# Patient Record
Sex: Female | Born: 1960 | Race: Black or African American | Hispanic: No | Smoking: Former smoker
Health system: Southern US, Community
[De-identification: ages and names within clinical notes are randomized; demographics above are authoritative.]

## PROBLEM LIST (undated history)

## (undated) DIAGNOSIS — E119 Type 2 diabetes mellitus without complications: Secondary | ICD-10-CM

## (undated) DIAGNOSIS — F32A Depression, unspecified: Secondary | ICD-10-CM

## (undated) DIAGNOSIS — R590 Localized enlarged lymph nodes: Secondary | ICD-10-CM

## (undated) DIAGNOSIS — C801 Malignant (primary) neoplasm, unspecified: Secondary | ICD-10-CM

## (undated) DIAGNOSIS — R51 Headache: Secondary | ICD-10-CM

## (undated) DIAGNOSIS — F329 Major depressive disorder, single episode, unspecified: Secondary | ICD-10-CM

## (undated) DIAGNOSIS — Z9119 Patient's noncompliance with other medical treatment and regimen: Secondary | ICD-10-CM

## (undated) DIAGNOSIS — R918 Other nonspecific abnormal finding of lung field: Secondary | ICD-10-CM

## (undated) DIAGNOSIS — I10 Essential (primary) hypertension: Secondary | ICD-10-CM

## (undated) HISTORY — DX: Essential (primary) hypertension: I10

## (undated) HISTORY — PX: TUBAL LIGATION: SHX77

## (undated) HISTORY — DX: Morbid (severe) obesity due to excess calories: E66.01

## (undated) HISTORY — DX: Other nonspecific abnormal finding of lung field: R91.8

## (undated) HISTORY — PX: CHOLECYSTECTOMY: SHX55

## (undated) HISTORY — DX: Depression, unspecified: F32.A

## (undated) HISTORY — DX: Type 2 diabetes mellitus without complications: E11.9

## (undated) HISTORY — DX: Major depressive disorder, single episode, unspecified: F32.9

## (undated) HISTORY — DX: Patient's noncompliance with other medical treatment and regimen: Z91.19

## (undated) HISTORY — DX: Localized enlarged lymph nodes: R59.0

---

## 2001-02-24 ENCOUNTER — Other Ambulatory Visit: Admission: RE | Admit: 2001-02-24 | Discharge: 2001-02-24 | Payer: Self-pay | Admitting: Obstetrics and Gynecology

## 2014-01-11 DIAGNOSIS — R590 Localized enlarged lymph nodes: Secondary | ICD-10-CM

## 2014-01-11 HISTORY — DX: Localized enlarged lymph nodes: R59.0

## 2014-01-30 DIAGNOSIS — R918 Other nonspecific abnormal finding of lung field: Secondary | ICD-10-CM

## 2014-01-30 HISTORY — DX: Other nonspecific abnormal finding of lung field: R91.8

## 2014-02-03 ENCOUNTER — Encounter: Payer: Self-pay | Admitting: *Deleted

## 2014-02-03 ENCOUNTER — Other Ambulatory Visit: Payer: Self-pay | Admitting: *Deleted

## 2014-02-03 ENCOUNTER — Encounter (HOSPITAL_COMMUNITY): Payer: Self-pay | Admitting: Pharmacy Technician

## 2014-02-03 ENCOUNTER — Encounter (HOSPITAL_COMMUNITY): Payer: Self-pay | Admitting: *Deleted

## 2014-02-03 ENCOUNTER — Institutional Professional Consult (permissible substitution) (INDEPENDENT_AMBULATORY_CARE_PROVIDER_SITE_OTHER): Admitting: Thoracic Surgery (Cardiothoracic Vascular Surgery)

## 2014-02-03 ENCOUNTER — Encounter: Payer: Self-pay | Admitting: Thoracic Surgery (Cardiothoracic Vascular Surgery)

## 2014-02-03 VITALS — BP 134/93 | HR 87 | Resp 16 | Ht 64.0 in | Wt 220.0 lb

## 2014-02-03 DIAGNOSIS — R918 Other nonspecific abnormal finding of lung field: Secondary | ICD-10-CM

## 2014-02-03 DIAGNOSIS — E1169 Type 2 diabetes mellitus with other specified complication: Secondary | ICD-10-CM | POA: Insufficient documentation

## 2014-02-03 DIAGNOSIS — E669 Obesity, unspecified: Secondary | ICD-10-CM | POA: Insufficient documentation

## 2014-02-03 DIAGNOSIS — R590 Localized enlarged lymph nodes: Secondary | ICD-10-CM

## 2014-02-03 DIAGNOSIS — F329 Major depressive disorder, single episode, unspecified: Secondary | ICD-10-CM | POA: Insufficient documentation

## 2014-02-03 DIAGNOSIS — E119 Type 2 diabetes mellitus without complications: Secondary | ICD-10-CM

## 2014-02-03 DIAGNOSIS — F32A Depression, unspecified: Secondary | ICD-10-CM | POA: Insufficient documentation

## 2014-02-03 DIAGNOSIS — I10 Essential (primary) hypertension: Secondary | ICD-10-CM | POA: Insufficient documentation

## 2014-02-03 NOTE — Progress Notes (Signed)
PCP is [name], [place] Referring Provider is Darovsky, Marko Stai, MD  Chief Complaint  Patient presents with  . Lung Lesion    referred by DR. DAROVSKY...CT CHEST...CT HEAD    HPI: 53 year old woman who presented with a chief complaint of chest pain.  Ms. Stallone is a 53 year old woman with a remote history of light tobacco use. She was in her usual state of health until last week when she began experiencing severe migraines. Then the last Thursday she noticed chest pain. She felt that this would go way, but by Monday the pain was severe and she went to the emergency department. A CT of the chest was done which showed massive subcarinal adenopathy and multiple bilateral pulmonary nodules.  Workup was negative for pulmonary embolus or MI. She was discharged and is now referred for surgical biopsy.  She states that she's been feeling well prior last week. She works as a Surveyor, minerals. She smoked less than a pack a day for about 7 years when she was younger, but quit in 65 at age 11. She has no known exposure to other carcinogens. She denies fevers or chills, but has been having some night sweats, which she attributed to possible menopause. She has lost 26 pounds over the past 6 months and 14 pounds over the past 3 months. She does state that she has been trying to lose weight and working with her physician on that. Her chest pain has resolved. She denies any cough, hemoptysis, wheezing, stridor, shortness of breath.   Past Medical History  Diagnosis Date  . Hypertension   . Depression   . Pulmonary nodules 01/30/14    CTA CHEST  . Lymphadenopathy, mediastinal 01/2014    CTA ANGIO .[place]  . Morbid obesity   . Diabetes mellitus, type II     Past Surgical History  Procedure Laterality Date  . Cholecystectomy    . Cesarean section      Family History  Problem Relation Age of Onset  . Cancer Mother     UTERINE  . Cancer Father     LEUKEMIA    Social  History History  Substance Use Topics  . Smoking status: Former Smoker -- 0.50 packs/day for 7 years    Types: Cigarettes    Quit date: 02/03/1989  . Smokeless tobacco: Not on file  . Alcohol Use: Not on file    Current Outpatient Prescriptions  Medication Sig Dispense Refill  . hydrochlorothiazide (MICROZIDE) 12.5 MG capsule Take 12.5 mg by mouth daily.      Marland Kitchen lisinopril (PRINIVIL,ZESTRIL) 10 MG tablet Take 10 mg by mouth daily.      . metFORMIN (GLUCOPHAGE) 500 MG tablet Take by mouth daily with breakfast.      . Multiple Vitamin (MULTIVITAMIN) capsule Take 1 capsule by mouth daily.       No current facility-administered medications for this visit.    Allergies  Allergen Reactions  . Asa [Aspirin] Hives    Review of Systems  Constitutional: Positive for fatigue. Negative for fever, chills and appetite change.       Weight loss 26 pounds over 6 months, 14 pounds over past 3 months. Has been trying to lose weight. Night sweats, questioned menopause  Respiratory: Negative for cough, shortness of breath and wheezing.   Cardiovascular: Positive for chest pain.  Neurological: Positive for headaches (Migraines).  All other systems reviewed and are negative.   BP 134/93  Pulse 87  Resp 16  Ht 5'  4" (1.626 m)  Wt 220 lb (99.791 kg)  BMI 37.74 kg/m2  SpO2 98% Physical Exam  Vitals reviewed. Constitutional: She is oriented to person, place, and time. No distress.  Obese  HENT:  Head: Normocephalic and atraumatic.  Eyes: EOM are normal. Pupils are equal, round, and reactive to light.  Neck: Neck supple. No thyromegaly present.  Cardiovascular: Normal rate, regular rhythm, normal heart sounds and intact distal pulses.   No murmur heard. Pulmonary/Chest: Effort normal and breath sounds normal. She has no wheezes. She has no rales.  Abdominal: Soft. There is no tenderness.  Musculoskeletal: She exhibits no edema.  Lymphadenopathy:    She has no cervical adenopathy.   Neurological: She is alert and oriented to person, place, and time. No cranial nerve deficit.  No focal motor deficits  Skin: Skin is warm and dry.     Diagnostic Tests: CT ANGIOGRAPHY CHEST WITH CONTRAST  FINDINGS: Mediastinal lymphadenopathy is identified with confluent subcarinal lymphadenopathy 3.6 cm image 32, and contiguous extension to the right hilum. Small AP window lymph nodes are identified, largest 0.6 cm image 22. Heart size is mildly enlarged. Trace pericardial fluid is present. Trace pleural effusions are noted. There are multiple pulmonary masses throughout both lungs, largest right lower lobe measuring 3.8 cm image 66. Largest representative left upper lobe nodule measures 0.6 cm image 37. Central airways are patent. There is mild mass effect and attenuation of the right mainstem bronchus by the above described subcarinal mass, for example image 40.  Evaluation for pulmonary embolism is suboptimal due to early bolus timing. There is no central filling defect in either main pulmonary artery to suggest acute pulmonary embolism.  No acute osseous abnormality.  Review of the MIP images confirms the above findings.  MPRESSION: Dominant subcarinal lymphadenopathy with contiguous right hilar extension, and multiple bilateral pulmonary nodules/masses. Primary differential considerations include primary pulmonary parenchymal malignancy with intrathoracic and subcarinal metastatic disease, primary mediastinal malignancy such as small cell lung cancer with intrathoracic metastasis, or non visualized primary malignancy elsewhere in the abdomen or pelvis with intrathoracic spread. The subcarinal mass is likely amenable to sampling at bronchoscopy.  Consider nonemergent outpatient CT abdomen/ pelvis with IV contrast for further evaluation depending on scheduled bronchoscopy.  Allowing for suboptimal bolus timing, no central focal filling defect to suggest acute pulmonary embolism is  identified.  These results will be called to the ordering clinician or representative by the Radiologist Assistant, and communication documented in the PACS or zVision Dashboard. Electronically Signed By: Conchita Paris M.D. On: 01/30/2014 16:17  Impression: 53 year old woman with a minimal smoking history who presents with multiple bilateral pulmonary nodules and massive mediastinal adenopathy, primarily in the subcarinal space. Differential diagnosis includes lymphoma, small cell lung cancer, non-small cell lung cancer, and sarcoidosis. Of these I think lymphoma is probably the most likely. In any event, she needs a tissue diagnosis so that appropriate treatment can be initiated.  I recommended to her that we proceed with bronchoscopy and endobronchial ultrasound and possible mediastinoscopy if the first 2 are nondiagnostic. We discussed the general nature of the procedure, including the use of general anesthesia and the incision to be used if necessary. We will plan to do this on an outpatient basis. We discussed the indications, risks, benefits, and alternatives. She understands the risks include, but are not limited to death, MI, bleeding, possible need for surgical repair bleeding, recurrent nerve injury leading to hoarseness, esophageal injury, pneumothorax, as well as the possibility of unforeseeable complications. She  accepts the risks and wishes to proceed as soon as possible.  Plan: Bronchoscopy, EBUS, and possible mediastinoscopy on Monday, July 27

## 2014-02-05 MED ORDER — DEXTROSE 5 % IV SOLN
1.5000 g | INTRAVENOUS | Status: AC
  Administered 2014-02-06: 1.5 g via INTRAVENOUS
  Filled 2014-02-05: qty 1.5

## 2014-02-06 ENCOUNTER — Ambulatory Visit (HOSPITAL_COMMUNITY)
Admission: AD | Admit: 2014-02-06 | Discharge: 2014-02-06 | Disposition: A | Discharge status: Home / Self Care | Source: Ambulatory Visit | Attending: Thoracic Surgery (Cardiothoracic Vascular Surgery) | Admitting: Thoracic Surgery (Cardiothoracic Vascular Surgery)

## 2014-02-06 ENCOUNTER — Encounter (HOSPITAL_COMMUNITY): Admitting: Anesthesiology

## 2014-02-06 ENCOUNTER — Ambulatory Visit (HOSPITAL_COMMUNITY): Admitting: Anesthesiology

## 2014-02-06 ENCOUNTER — Encounter (HOSPITAL_COMMUNITY)
Admission: AD | Disposition: A | Discharge status: Home / Self Care | Payer: Self-pay | Source: Ambulatory Visit | Attending: Thoracic Surgery (Cardiothoracic Vascular Surgery)

## 2014-02-06 ENCOUNTER — Ambulatory Visit (HOSPITAL_COMMUNITY)

## 2014-02-06 ENCOUNTER — Encounter (HOSPITAL_COMMUNITY): Payer: Self-pay | Admitting: Anesthesiology

## 2014-02-06 DIAGNOSIS — I10 Essential (primary) hypertension: Secondary | ICD-10-CM | POA: Diagnosis not present

## 2014-02-06 DIAGNOSIS — E119 Type 2 diabetes mellitus without complications: Secondary | ICD-10-CM | POA: Insufficient documentation

## 2014-02-06 DIAGNOSIS — F329 Major depressive disorder, single episode, unspecified: Secondary | ICD-10-CM | POA: Diagnosis not present

## 2014-02-06 DIAGNOSIS — F3289 Other specified depressive episodes: Secondary | ICD-10-CM | POA: Diagnosis not present

## 2014-02-06 DIAGNOSIS — R590 Localized enlarged lymph nodes: Secondary | ICD-10-CM

## 2014-02-06 DIAGNOSIS — R599 Enlarged lymph nodes, unspecified: Secondary | ICD-10-CM | POA: Insufficient documentation

## 2014-02-06 DIAGNOSIS — Z79899 Other long term (current) drug therapy: Secondary | ICD-10-CM | POA: Insufficient documentation

## 2014-02-06 DIAGNOSIS — R911 Solitary pulmonary nodule: Secondary | ICD-10-CM | POA: Diagnosis not present

## 2014-02-06 DIAGNOSIS — R918 Other nonspecific abnormal finding of lung field: Secondary | ICD-10-CM

## 2014-02-06 DIAGNOSIS — Z886 Allergy status to analgesic agent status: Secondary | ICD-10-CM | POA: Insufficient documentation

## 2014-02-06 DIAGNOSIS — Z87891 Personal history of nicotine dependence: Secondary | ICD-10-CM | POA: Insufficient documentation

## 2014-02-06 HISTORY — DX: Headache: R51

## 2014-02-06 HISTORY — PX: VIDEO BRONCHOSCOPY WITH ENDOBRONCHIAL ULTRASOUND: SHX6177

## 2014-02-06 LAB — CBC
HCT: 38.8 % (ref 36.0–46.0)
HEMOGLOBIN: 12.5 g/dL (ref 12.0–15.0)
MCH: 27.8 pg (ref 26.0–34.0)
MCHC: 32.2 g/dL (ref 30.0–36.0)
MCV: 86.2 fL (ref 78.0–100.0)
PLATELETS: 440 10*3/uL — AB (ref 150–400)
RBC: 4.5 MIL/uL (ref 3.87–5.11)
RDW: 14.9 % (ref 11.5–15.5)
WBC: 12.3 10*3/uL — ABNORMAL HIGH (ref 4.0–10.5)

## 2014-02-06 LAB — COMPREHENSIVE METABOLIC PANEL
ALT: 27 U/L (ref 0–35)
AST: 17 U/L (ref 0–37)
Albumin: 3.3 g/dL — ABNORMAL LOW (ref 3.5–5.2)
Alkaline Phosphatase: 110 U/L (ref 39–117)
Anion gap: 14 (ref 5–15)
BUN: 10 mg/dL (ref 6–23)
CALCIUM: 9.5 mg/dL (ref 8.4–10.5)
CHLORIDE: 103 meq/L (ref 96–112)
CO2: 21 mEq/L (ref 19–32)
CREATININE: 0.62 mg/dL (ref 0.50–1.10)
GLUCOSE: 100 mg/dL — AB (ref 70–99)
Potassium: 4.1 mEq/L (ref 3.7–5.3)
Sodium: 138 mEq/L (ref 137–147)
Total Protein: 8.2 g/dL (ref 6.0–8.3)

## 2014-02-06 LAB — APTT: aPTT: 31 seconds (ref 24–37)

## 2014-02-06 LAB — GLUCOSE, CAPILLARY
GLUCOSE-CAPILLARY: 102 mg/dL — AB (ref 70–99)
GLUCOSE-CAPILLARY: 108 mg/dL — AB (ref 70–99)

## 2014-02-06 LAB — ABO/RH: ABO/RH(D): O POS

## 2014-02-06 LAB — TYPE AND SCREEN
ABO/RH(D): O POS
Antibody Screen: NEGATIVE

## 2014-02-06 LAB — PROTIME-INR
INR: 0.94 (ref 0.00–1.49)
PROTHROMBIN TIME: 12.6 s (ref 11.6–15.2)

## 2014-02-06 SURGERY — BRONCHOSCOPY, WITH EBUS
Anesthesia: General | Site: Bronchus

## 2014-02-06 MED ORDER — ONDANSETRON HCL 4 MG/2ML IJ SOLN
INTRAMUSCULAR | Status: DC | PRN
  Administered 2014-02-06: 4 mg via INTRAVENOUS

## 2014-02-06 MED ORDER — ONDANSETRON HCL 4 MG/2ML IJ SOLN
INTRAMUSCULAR | Status: AC
  Filled 2014-02-06: qty 2

## 2014-02-06 MED ORDER — ROCURONIUM BROMIDE 100 MG/10ML IV SOLN
INTRAVENOUS | Status: DC | PRN
  Administered 2014-02-06: 40 mg via INTRAVENOUS
  Administered 2014-02-06: 10 mg via INTRAVENOUS

## 2014-02-06 MED ORDER — PROPOFOL 10 MG/ML IV BOLUS
INTRAVENOUS | Status: DC | PRN
  Administered 2014-02-06: 200 mg via INTRAVENOUS
  Administered 2014-02-06: 40 mg via INTRAVENOUS

## 2014-02-06 MED ORDER — 0.9 % SODIUM CHLORIDE (POUR BTL) OPTIME
TOPICAL | Status: DC | PRN
  Administered 2014-02-06: 1000 mL

## 2014-02-06 MED ORDER — ONDANSETRON HCL 4 MG/2ML IJ SOLN: 4.0000 mg | Freq: Once | INTRAMUSCULAR | Status: DC | PRN

## 2014-02-06 MED ORDER — ROCURONIUM BROMIDE 50 MG/5ML IV SOLN
INTRAVENOUS | Status: AC
  Filled 2014-02-06: qty 1

## 2014-02-06 MED ORDER — LIDOCAINE HCL (CARDIAC) 20 MG/ML IV SOLN
INTRAVENOUS | Status: DC | PRN
  Administered 2014-02-06: 30 mg via INTRAVENOUS

## 2014-02-06 MED ORDER — PROPOFOL 10 MG/ML IV BOLUS
INTRAVENOUS | Status: AC
Start: 2014-02-06 — End: 2014-02-06
  Filled 2014-02-06: qty 20

## 2014-02-06 MED ORDER — LIDOCAINE HCL (CARDIAC) 20 MG/ML IV SOLN
INTRAVENOUS | Status: AC
  Filled 2014-02-06: qty 5

## 2014-02-06 MED ORDER — MIDAZOLAM HCL 2 MG/2ML IJ SOLN
INTRAMUSCULAR | Status: AC
  Filled 2014-02-06: qty 2

## 2014-02-06 MED ORDER — CHLORHEXIDINE GLUCONATE CLOTH 2 % EX PADS: 6.0000 | MEDICATED_PAD | Freq: Once | CUTANEOUS | Status: DC

## 2014-02-06 MED ORDER — SODIUM CHLORIDE 0.9 % IV SOLN
10.0000 mg | INTRAVENOUS | Status: DC | PRN
  Administered 2014-02-06: 10 ug/min via INTRAVENOUS

## 2014-02-06 MED ORDER — LACTATED RINGERS IV SOLN
INTRAVENOUS | Status: DC
  Administered 2014-02-06 (×2): via INTRAVENOUS

## 2014-02-06 MED ORDER — MIDAZOLAM HCL 5 MG/5ML IJ SOLN
INTRAMUSCULAR | Status: DC | PRN
  Administered 2014-02-06: 2 mg via INTRAVENOUS

## 2014-02-06 MED ORDER — HYDROMORPHONE HCL PF 1 MG/ML IJ SOLN
INTRAMUSCULAR | Status: AC
  Filled 2014-02-06: qty 1

## 2014-02-06 MED ORDER — DEXAMETHASONE SODIUM PHOSPHATE 4 MG/ML IJ SOLN
INTRAMUSCULAR | Status: DC | PRN
  Administered 2014-02-06: 4 mg via INTRAVENOUS

## 2014-02-06 MED ORDER — NEOSTIGMINE METHYLSULFATE 10 MG/10ML IV SOLN
INTRAVENOUS | Status: DC | PRN
  Administered 2014-02-06: 3 mg via INTRAVENOUS

## 2014-02-06 MED ORDER — GLYCOPYRROLATE 0.2 MG/ML IJ SOLN
INTRAMUSCULAR | Status: AC
  Filled 2014-02-06: qty 2

## 2014-02-06 MED ORDER — HYDROMORPHONE HCL PF 1 MG/ML IJ SOLN
0.2500 mg | INTRAMUSCULAR | Status: DC | PRN
Start: 2014-02-06 — End: 2014-02-06
  Administered 2014-02-06: 0.25 mg via INTRAVENOUS

## 2014-02-06 MED ORDER — FENTANYL CITRATE 0.05 MG/ML IJ SOLN
INTRAMUSCULAR | Status: DC | PRN
  Administered 2014-02-06 (×3): 50 ug via INTRAVENOUS
  Administered 2014-02-06: 100 ug via INTRAVENOUS

## 2014-02-06 MED ORDER — GLYCOPYRROLATE 0.2 MG/ML IJ SOLN
INTRAMUSCULAR | Status: DC | PRN
  Administered 2014-02-06: 0.4 mg via INTRAVENOUS

## 2014-02-06 MED ORDER — FENTANYL CITRATE 0.05 MG/ML IJ SOLN
INTRAMUSCULAR | Status: AC
  Filled 2014-02-06: qty 5

## 2014-02-06 MED ORDER — PROPOFOL 10 MG/ML IV BOLUS
INTRAVENOUS | Status: AC
  Filled 2014-02-06: qty 20

## 2014-02-06 SURGICAL SUPPLY — 62 items
ADH SKN CLS APL DERMABOND .7 (GAUZE/BANDAGES/DRESSINGS)
APPLIER CLIP LOGIC TI 5 (MISCELLANEOUS) IMPLANT
APR CLP MED LRG 33X5 (MISCELLANEOUS)
BALL CTTN LRG ABS STRL LF (GAUZE/BANDAGES/DRESSINGS)
BLADE SURG 15 STRL LF DISP TIS (BLADE) IMPLANT
BLADE SURG 15 STRL SS (BLADE)
BRUSH CYTOL CELLEBRITY 1.5X140 (MISCELLANEOUS) ×4 IMPLANT
CANISTER SUCTION 2500CC (MISCELLANEOUS) ×4 IMPLANT
CLIP TI MEDIUM 6 (CLIP) IMPLANT
CONT SPEC 4OZ CLIKSEAL STRL BL (MISCELLANEOUS) ×4 IMPLANT
COTTONBALL LRG STERILE PKG (GAUZE/BANDAGES/DRESSINGS) IMPLANT
COVER SURGICAL LIGHT HANDLE (MISCELLANEOUS) IMPLANT
COVER TABLE BACK 60X90 (DRAPES) ×4 IMPLANT
DERMABOND ADVANCED (GAUZE/BANDAGES/DRESSINGS)
DERMABOND ADVANCED .7 DNX12 (GAUZE/BANDAGES/DRESSINGS) IMPLANT
DRAPE CHEST BREAST 15X10 FENES (DRAPES) IMPLANT
ELECT REM PT RETURN 9FT ADLT (ELECTROSURGICAL)
ELECTRODE REM PT RTRN 9FT ADLT (ELECTROSURGICAL) IMPLANT
FILTER STRAW FLUID ASPIR (MISCELLANEOUS) IMPLANT
FORCEPS BIOP RJ4 1.8 (CUTTING FORCEPS) ×4 IMPLANT
GAUZE SPONGE 4X4 16PLY XRAY LF (GAUZE/BANDAGES/DRESSINGS) IMPLANT
GLOVE SURG SIGNA 7.5 PF LTX (GLOVE) ×4 IMPLANT
GOWN STRL REUS W/ TWL LRG LVL3 (GOWN DISPOSABLE) ×2 IMPLANT
GOWN STRL REUS W/ TWL XL LVL3 (GOWN DISPOSABLE) ×4 IMPLANT
GOWN STRL REUS W/TWL LRG LVL3 (GOWN DISPOSABLE) ×4
GOWN STRL REUS W/TWL XL LVL3 (GOWN DISPOSABLE) ×6
HEMOSTAT SURGICEL 2X14 (HEMOSTASIS) IMPLANT
KIT BASIN OR (CUSTOM PROCEDURE TRAY) IMPLANT
KIT ROOM TURNOVER OR (KITS) ×4 IMPLANT
MARKER SKIN DUAL TIP RULER LAB (MISCELLANEOUS) ×4 IMPLANT
NEEDLE 22X1 1/2 (OR ONLY) (NEEDLE) IMPLANT
NEEDLE BIOPSY TRANSBRONCH 21G (NEEDLE) IMPLANT
NEEDLE BLUNT 18X1 FOR OR ONLY (NEEDLE) IMPLANT
NEEDLE SYS SONOTIP II EBUSTBNA (NEEDLE) ×8 IMPLANT
NS IRRIG 1000ML POUR BTL (IV SOLUTION) ×4 IMPLANT
OIL SILICONE PENTAX (PARTS (SERVICE/REPAIRS)) IMPLANT
PACK SURGICAL SETUP 50X90 (CUSTOM PROCEDURE TRAY) IMPLANT
PAD ARMBOARD 7.5X6 YLW CONV (MISCELLANEOUS) ×8 IMPLANT
PENCIL BUTTON HOLSTER BLD 10FT (ELECTRODE) IMPLANT
SPONGE GAUZE 4X4 12PLY (GAUZE/BANDAGES/DRESSINGS) ×4 IMPLANT
SPONGE INTESTINAL PEANUT (DISPOSABLE) IMPLANT
SUT SILK 2 0 TIES 10X30 (SUTURE) IMPLANT
SUT VIC AB 2-0 CT1 27 (SUTURE)
SUT VIC AB 2-0 CT1 TAPERPNT 27 (SUTURE) IMPLANT
SUT VIC AB 3-0 SH 18 (SUTURE) IMPLANT
SUT VIC AB 3-0 SH 27 (SUTURE)
SUT VIC AB 3-0 SH 27X BRD (SUTURE) IMPLANT
SUT VICRYL 4-0 PS2 18IN ABS (SUTURE) IMPLANT
SWAB COLLECTION DEVICE MRSA (MISCELLANEOUS) IMPLANT
SYR 20CC LL (SYRINGE) ×4 IMPLANT
SYR 20ML ECCENTRIC (SYRINGE) ×4 IMPLANT
SYR 5ML LL (SYRINGE) IMPLANT
SYR 5ML LUER SLIP (SYRINGE) IMPLANT
SYR CONTROL 10ML LL (SYRINGE) IMPLANT
SYRINGE 10CC LL (SYRINGE) IMPLANT
TOWEL OR 17X24 6PK STRL BLUE (TOWEL DISPOSABLE) IMPLANT
TOWEL OR 17X26 10 PK STRL BLUE (TOWEL DISPOSABLE) IMPLANT
TRAP SPECIMEN MUCOUS 40CC (MISCELLANEOUS) ×4 IMPLANT
TUBE ANAEROBIC SPECIMEN COL (MISCELLANEOUS) IMPLANT
TUBE CONNECTING 12'X1/4 (SUCTIONS) ×1
TUBE CONNECTING 12X1/4 (SUCTIONS) ×3 IMPLANT
WATER STERILE IRR 1000ML POUR (IV SOLUTION) IMPLANT

## 2014-02-06 NOTE — Anesthesia Preprocedure Evaluation (Addendum)
Anesthesia Evaluation  Patient identified by MRN, date of birth, ID band Patient awake    Reviewed: Allergy & Precautions, H&P , NPO status , Patient's Chart, lab work & pertinent test results  Airway Mallampati: I      Dental  (+) Teeth Intact   Pulmonary former smoker,          Cardiovascular hypertension, Pt. on medications     Neuro/Psych  Headaches, Depression    GI/Hepatic   Endo/Other  diabetes, Type 2, Oral Hypoglycemic Agents  Renal/GU      Musculoskeletal   Abdominal   Peds  Hematology   Anesthesia Other Findings   Reproductive/Obstetrics                          Anesthesia Physical Anesthesia Plan  ASA: II  Anesthesia Plan: General   Post-op Pain Management:    Induction: Intravenous  Airway Management Planned: Oral ETT  Additional Equipment:   Intra-op Plan:   Post-operative Plan: Extubation in OR  Informed Consent: I have reviewed the patients History and Physical, chart, labs and discussed the procedure including the risks, benefits and alternatives for the proposed anesthesia with the patient or authorized representative who has indicated his/her understanding and acceptance.     Plan Discussed with: CRNA, Anesthesiologist and Surgeon  Anesthesia Plan Comments:         Anesthesia Quick Evaluation

## 2014-02-06 NOTE — Anesthesia Postprocedure Evaluation (Signed)
  Anesthesia Post-op Note  Patient: Erica Barker  Procedure(s) Performed: Procedure(s): VIDEO BRONCHOSCOPY WITH ENDOBRONCHIAL ULTRASOUND,bronchial biopsies , node sampling (N/A)  Patient Location: PACU  Anesthesia Type:General  Level of Consciousness: awake, alert , oriented and patient cooperative  Airway and Oxygen Therapy: Patient Spontanous Breathing  Post-op Pain: none  Post-op Assessment: Post-op Vital signs reviewed, Patient's Cardiovascular Status Stable, Respiratory Function Stable, Patent Airway, No signs of Nausea or vomiting and Pain level controlled  Post-op Vital Signs: stable  Last Vitals:  Filed Vitals:   02/06/14 1204  BP: 150/82  Pulse: 73  Temp:   Resp: 23    Complications: No apparent anesthesia complications

## 2014-02-06 NOTE — Transfer of Care (Signed)
Immediate Anesthesia Transfer of Care Note  Patient: Erica Barker  Procedure(s) Performed: Procedure(s): VIDEO BRONCHOSCOPY WITH ENDOBRONCHIAL ULTRASOUND,bronchial biopsies , node sampling (N/A)  Patient Location: PACU  Anesthesia Type:General  Level of Consciousness: sedated  Airway & Oxygen Therapy: Patient Spontanous Breathing and Patient connected to face mask oxygen  Post-op Assessment: Report given to PACU RN, Post -op Vital signs reviewed and stable and Patient moving all extremities X 4  Post vital signs: Reviewed and stable  Complications: No apparent anesthesia complications

## 2014-02-06 NOTE — Progress Notes (Signed)
Called Dr.Smith r/t pt has swelling in upper lip. States most likely from tube. No new orders.

## 2014-02-06 NOTE — Anesthesia Procedure Notes (Signed)
Procedure Name: Intubation Date/Time: 02/06/2014 10:08 AM Performed by: Rush Farmer E Pre-anesthesia Checklist: Patient identified, Emergency Drugs available, Suction available, Patient being monitored and Timeout performed Patient Re-evaluated:Patient Re-evaluated prior to inductionOxygen Delivery Method: Circle system utilized Preoxygenation: Pre-oxygenation with 100% oxygen Intubation Type: IV induction Ventilation: Mask ventilation without difficulty Laryngoscope Size: Mac and 3 Grade View: Grade III Tube type: Oral Tube size: 8.5 mm Number of attempts: 1 Airway Equipment and Method: Bougie stylet and LTA kit utilized Placement Confirmation: positive ETCO2 and breath sounds checked- equal and bilateral Secured at: 19 cm Tube secured with: Tape Dental Injury: Teeth and Oropharynx as per pre-operative assessment  Comments: AOI per [place], SRNA with Dr. Tamala Julian supervising. Grade III view with MAC3. Blue bougie used. +ETCO2 and BBS=.

## 2014-02-06 NOTE — Interval H&P Note (Signed)
History and Physical Interval Note:  02/06/2014 9:49 AM  Erica Barker  has presented today for surgery, with the diagnosis of LUNG NODULES MEDIASTINAL ADENOPATHY  The various methods of treatment have been discussed with the patient and family. After consideration of risks, benefits and other options for treatment, the patient has consented to  Procedure(s): VIDEO BRONCHOSCOPY WITH ENDOBRONCHIAL ULTRASOUND (N/A) MEDIASTINOSCOPY (N/A) as a surgical intervention .  The patient's history has been reviewed, patient examined, no change in status, stable for surgery.  I have reviewed the patient's chart and labs.  Questions were answered to the patient's satisfaction.     Sway Guttierrez C

## 2014-02-06 NOTE — Brief Op Note (Signed)
02/06/2014  11:50 AM  PATIENT:  Velora Mediate  53 y.o. female  PRE-OPERATIVE DIAGNOSIS:   LUNG NODULES MEDIASTINAL ADENOPATHY  POST-OPERATIVE DIAGNOSIS:   LUNG NODULES MEDIASTINAL ADENOPATHY  PROCEDURE:  Procedure(s): VIDEO BRONCHOSCOPY WITH ENDOBRONCHIAL ULTRASOUND,bronchial biopsies , node sampling (N/A)  SURGEON:  Surgeon(s) and Role:    * Melrose Nakayama, MD - Primary   ANESTHESIA:   general  EBL:  Total I/O In: 1000 [I.V.:1000] Out: -   BLOOD ADMINISTERED:none  DRAINS: none   LOCAL MEDICATIONS USED:  NONE  SPECIMEN:  Source of Specimen:  level 7 lymph nodes, carinal biopsies, RUL brushings  DISPOSITION OF SPECIMEN:  PATHOLOGY  PLAN OF CARE: Discharge to home after PACU  PATIENT DISPOSITION:  PACU - hemodynamically stable.   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

## 2014-02-06 NOTE — H&P (View-Only) (Signed)
PCP is [name], [place] Referring Provider is Darovsky, Marko Stai, MD  Chief Complaint  Patient presents with  . Lung Lesion    referred by DR. DAROVSKY...CT CHEST...CT HEAD    HPI: 53 year old woman who presented with a chief complaint of chest pain.  Ms. Kitchen is a 53 year old woman with a remote history of light tobacco use. She was in her usual state of health until last week when she began experiencing severe migraines. Then the last Thursday she noticed chest pain. She felt that this would go way, but by Monday the pain was severe and she went to the emergency department. A CT of the chest was done which showed massive subcarinal adenopathy and multiple bilateral pulmonary nodules.  Workup was negative for pulmonary embolus or MI. She was discharged and is now referred for surgical biopsy.  She states that she's been feeling well prior last week. 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 She has no known exposure to other carcinogens. She denies fevers or chills, but has been having some night sweats, which she attributed to possible menopause. She has lost 26 pounds over the past 6 months and 14 pounds over the past 3 months. She does state that she has been trying to lose weight and working with her physician on that. Her chest pain has resolved. She denies any cough, hemoptysis, wheezing, stridor, shortness of breath.   Past Medical History  Diagnosis Date  . Hypertension   . Depression   . Pulmonary nodules 01/30/14    CTA CHEST  . Lymphadenopathy, mediastinal 01/2014    [place]IO .[place]  . Morbid obesity   . Diabetes mellitus, type II     Past Surgical History  Procedure Laterality Date  . Cholecystectomy    . Cesarean section      Family History  Problem Relation Age of Onset  . Cancer Mother     UTERINE  . Cancer Father     LEUKEMIA    Social  History History  Substance Use Topics  . Smoking status: Former Smoker -- 0.50 packs/day for 7 years    Types: Cigarettes    Quit date: 02/03/1989  . Smokeless tobacco: Not on file  . Alcohol Use: Not on file    Current Outpatient Prescriptions  Medication Sig Dispense Refill  . hydrochlorothiazide (MICROZIDE) 12.5 MG capsule Take 12.5 mg by mouth daily.      Marland Kitchen lisinopril (PRINIVIL,ZESTRIL) 10 MG tablet Take 10 mg by mouth daily.      . metFORMIN (GLUCOPHAGE) 500 MG tablet Take by mouth daily with breakfast.      . Multiple Vitamin (MULTIVITAMIN) capsule Take 1 capsule by mouth daily.       No current facility-administered medications for this visit.    Allergies  Allergen Reactions  . Asa [Aspirin] Hives    Review of Systems  Constitutional: Positive for fatigue. Negative for fever, chills and appetite change.       Weight loss 26 pounds over 6 months, 14 pounds over past 3 months. Has been trying to lose weight. Night sweats, questioned menopause  Respiratory: Negative for cough, shortness of breath and wheezing.   Cardiovascular: Positive for chest pain.  Neurological: Positive for headaches (Migraines).  All other systems reviewed and are negative.   BP 134/93  Pulse 87  Resp 16  Ht 5'  4" (1.626 m)  Wt 220 lb (99.791 kg)  BMI 37.74 kg/m2  SpO2 98% Physical Exam  Vitals reviewed. Constitutional: She is oriented to person, place, and time. No distress.  Obese  HENT:  Head: Normocephalic and atraumatic.  Eyes: EOM are normal. Pupils are equal, round, and reactive to light.  Neck: Neck supple. No thyromegaly present.  Cardiovascular: Normal rate, regular rhythm, normal heart sounds and intact distal pulses.   No murmur heard. Pulmonary/Chest: Effort normal and breath sounds normal. She has no wheezes. She has no rales.  Abdominal: Soft. There is no tenderness.  Musculoskeletal: She exhibits no edema.  Lymphadenopathy:    She has no cervical adenopathy.   Neurological: She is alert and oriented to person, place, and time. No cranial nerve deficit.  No focal motor deficits  Skin: Skin is warm and dry.     Diagnostic Tests: CT ANGIOGRAPHY CHEST WITH CONTRAST  FINDINGS: Mediastinal lymphadenopathy is identified with confluent subcarinal lymphadenopathy 3.6 cm image 32, and contiguous extension to the right hilum. Small AP window lymph nodes are identified, largest 0.6 cm image 22. Heart size is mildly enlarged. Trace pericardial fluid is present. Trace pleural effusions are noted. There are multiple pulmonary masses throughout both lungs, largest right lower lobe measuring 3.8 cm image 66. Largest representative left upper lobe nodule measures 0.6 cm image 37. Central airways are patent. There is mild mass effect and attenuation of the right mainstem bronchus by the above described subcarinal mass, for example image 40.  Evaluation for pulmonary embolism is suboptimal due to early bolus timing. There is no central filling defect in either main pulmonary artery to suggest acute pulmonary embolism.  No acute osseous abnormality.  Review of the MIP images confirms the above findings.  MPRESSION: Dominant subcarinal lymphadenopathy with contiguous right hilar extension, and multiple bilateral pulmonary nodules/masses. Primary differential considerations include primary pulmonary parenchymal malignancy with intrathoracic and subcarinal metastatic disease, primary mediastinal malignancy such as small cell lung cancer with intrathoracic metastasis, or non visualized primary malignancy elsewhere in the abdomen or pelvis with intrathoracic spread. The subcarinal mass is likely amenable to sampling at bronchoscopy.  Consider nonemergent outpatient CT abdomen/ pelvis with IV contrast for further evaluation depending on scheduled bronchoscopy.  Allowing for suboptimal bolus timing, no central focal filling defect to suggest acute pulmonary embolism is  identified.  These results will be called to the ordering clinician or representative by the Radiologist Assistant, and communication documented in the PACS or zVision Dashboard. Electronically Signed By: Conchita Paris M.D. On: 01/30/2014 16:17  Impression: 53 year old woman with a minimal smoking history who presents with multiple bilateral pulmonary nodules and massive mediastinal adenopathy, primarily in the subcarinal space. Differential diagnosis includes lymphoma, small cell lung cancer, non-small cell lung cancer, and sarcoidosis. Of these I think lymphoma is probably the most likely. In any event, she needs a tissue diagnosis so that appropriate treatment can be initiated.  I recommended to her that we proceed with bronchoscopy and endobronchial ultrasound and possible mediastinoscopy if the first 2 are nondiagnostic. We discussed the general nature of the procedure, including the use of general anesthesia and the incision to be used if necessary. We will plan to do this on an outpatient basis. We discussed the indications, risks, benefits, and alternatives. She understands the risks include, but are not limited to death, MI, bleeding, possible need for surgical repair bleeding, recurrent nerve injury leading to hoarseness, esophageal injury, pneumothorax, as well as the possibility of unforeseeable complications. She  accepts the risks and wishes to proceed as soon as possible.  Plan: Bronchoscopy, EBUS, and possible mediastinoscopy on Monday, July 27

## 2014-02-06 NOTE — Progress Notes (Signed)
Noted patient has swollen lip, Dr. Tamala Julian at bedside.  Ordered to place ice on lip.

## 2014-02-06 NOTE — Discharge Instructions (Addendum)
Do not drive or engage in heavy physical activity for 24 hours  You may cough up small amounts of blood over the next few days  Call [phone] for a follow up appointment next week  What to eat:  For your first meals, you should eat lightly; only small meals initially.  If you do not have nausea, you may eat larger meals.  Avoid spicy, greasy and heavy food.    General Anesthesia, Adult, Care After  Refer to this sheet in the next few weeks. These instructions provide you with information on caring for yourself after your procedure. Your health care provider may also give you more specific instructions. Your treatment has been planned according to current medical practices, but problems sometimes occur. Call your health care provider if you have any problems or questions after your procedure.  WHAT TO EXPECT AFTER THE PROCEDURE  After the procedure, it is typical to experience:  Sleepiness.  Nausea and vomiting. HOME CARE INSTRUCTIONS  For the first 24 hours after general anesthesia:  Have a responsible person with you.  Do not drive a car. If you are alone, do not take public transportation.  Do not drink alcohol.  Do not take medicine that has not been prescribed by your health care provider.  Do not sign important papers or make important decisions.  You may resume a normal diet and activities as directed by your health care provider.  Change bandages (dressings) as directed.  If you have questions or problems that seem related to general anesthesia, call the hospital and ask for the anesthetist or anesthesiologist on call. SEEK MEDICAL CARE IF:  You have nausea and vomiting that continue the day after anesthesia.  You develop a rash. SEEK IMMEDIATE MEDICAL CARE IF:  You have difficulty breathing.  You have chest pain.  You have any allergic problems. Document Released: 10/06/2000 Document Revised: 03/02/2013 Document Reviewed: 01/13/2013  [facility] Patient Information 2014  [place], [place].

## 2014-02-07 ENCOUNTER — Encounter (HOSPITAL_COMMUNITY): Payer: Self-pay | Admitting: Thoracic Surgery (Cardiothoracic Vascular Surgery)

## 2014-02-07 NOTE — Op Note (Signed)
NAMEMarland Kitchen  Erica Barker, Erica Barker            ACCOUNT NO.:  [id number]  MEDICAL RECORD NO.:  [id number]  LOCATION:  [facility]                         FACILITY:  [facility]  PHYSICIAN:  Revonda Standard. Roxan Hockey, M.D.DATE OF BIRTH:  1961-04-20  DATE OF PROCEDURE:  02/06/2014 DATE OF DISCHARGE:  02/06/2014                              OPERATIVE REPORT   PREOPERATIVE DIAGNOSIS:  Multiple lung nodules with mediastinal adenopathy.  POSTOPERATIVE DIAGNOSIS:  Multiple lung nodules with mediastinal adenopathy, likely carcinoma.  PROCEDURE:  Video bronchoscopy with brushings and biopsies, endobronchial ultrasound with mediastinal lymph node sampling.  SURGEON:  Revonda Standard. Roxan Hockey, M.D.  ASSISTANT:  None.  ANESTHESIA:  General.  FINDINGS:  Massively enlarged subcarinal node.  Initial aspirations revealed malignant cells, likely carcinoma. Questionable endobronchial lesion in the anterior segmental bronchus of the right upper lobe. Thickening and nodularity of the mucosa at the carina.  CLINICAL NOTE:  Erica Barker is a 53 year old woman who had presented with chest pain.  A CT of the chest was done to rule out pulmonary embolus.  There was no pulmonary embolus, but she was found to have multiple large lung nodules and massive subcarinal adenopathy.  She was advised to undergo bronchoscopy and endobronchial ultrasound with possible mediastinoscopy in order to establish a diagnosis.  The indications, risks, benefits, and alternatives were discussed in detail with the patient.  She understood and accepted the risks and agreed to proceed.  OPERATIVE NOTE:  Erica Barker was brought to the operating room on February 06, 2014.  She had induction of general anesthesia and was intubated. Flexible fiberoptic bronchoscopy was performed via the endotracheal tube.  There was normal endobronchial anatomy.  The carina was blunted and there was thickening of the mucosa and some nodularity to the mucosa at the carina.   There was a questionable endobronchial lesion in the anterior segmental bronchus of the right upper lobe.  This more likely was redundant mucosa, but brushings were performed from this area.  The endobronchial ultrasound scope then was placed.  The subcarinal nodal mass was easily localized.  Multiple aspirations were performed. The initial samples were sent for Quick-Prep.  The remaining samples were sent for permanent pathology.  Quick-Prep revealed malignant cells, likely carcinoma, but no definitive diagnosis could be made.  Additional aspirations were taken again and sent for permanent pathology only.  The ultrasound scope was withdrawn and the bronchoscope was replaced.  The mucosa at the carina had a thickened nodular appearance to it, this may have just been reactive to the underlying adenopathy. Biopsies were taken of the carina.  After taking biopsy of the mucosa, it appeared I was possibly into the subcarinal nodal mass.  Additional biopsies were taken from that area as well and sent as a separate specimen.  Dilute epinephrine was applied to assist with hemostasis.  After ensuring there was no ongoing bleeding, the scope was withdrawn.  The patient was extubated in the operating room and taken to the postanesthetic care unit in good condition.     Revonda Standard Roxan Hockey, M.D.     SCH/MEDQ  D:  02/06/2014  T:  02/07/2014  Job:  [id number]

## 2014-02-09 ENCOUNTER — Encounter: Payer: Self-pay | Admitting: Thoracic Surgery (Cardiothoracic Vascular Surgery)

## 2014-02-09 ENCOUNTER — Other Ambulatory Visit: Payer: Self-pay | Admitting: *Deleted

## 2014-02-09 ENCOUNTER — Ambulatory Visit (INDEPENDENT_AMBULATORY_CARE_PROVIDER_SITE_OTHER): Payer: Self-pay | Admitting: Thoracic Surgery (Cardiothoracic Vascular Surgery)

## 2014-02-09 VITALS — BP 115/80 | HR 97 | Ht 64.0 in | Wt 220.0 lb

## 2014-02-09 DIAGNOSIS — R918 Other nonspecific abnormal finding of lung field: Secondary | ICD-10-CM

## 2014-02-09 DIAGNOSIS — C801 Malignant (primary) neoplasm, unspecified: Secondary | ICD-10-CM

## 2014-02-09 DIAGNOSIS — C3490 Malignant neoplasm of unspecified part of unspecified bronchus or lung: Secondary | ICD-10-CM

## 2014-02-09 NOTE — Progress Notes (Signed)
Patient ID: Erica Barker, female   DOB: August 02, 1960, 53 y.o.   MRN: [id number]   Erica Barker returns today after having an EBUS on Monday. She has bilateral lung masses and a massive subcarinal lymph node. 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  I once again discussed the results of the biopsy with her. She will need chemotherapy and possibly radiation to the lymph node as well.   We reviewed the path in [facility] conference this morning. There was insufficient tissue for immuno stains and genetic testing. I discussed the importance of obtaining more tissue for the additional testing as it might provide valuable information that will improve her treatment. She is willing to undergo an additional biopsy.  IR felt that they could get adequate tissue with a CT guided biopsy of the RLL mass. We will arrange for that. She has an appointment with Dr. Jacquiline Doe next week.

## 2014-02-12 ENCOUNTER — Other Ambulatory Visit: Payer: Self-pay | Admitting: Radiology

## 2014-02-15 ENCOUNTER — Ambulatory Visit (HOSPITAL_COMMUNITY)
Admission: RE | Admit: 2014-02-15 | Discharge: 2014-02-15 | Disposition: A | Discharge status: Home / Self Care | Source: Ambulatory Visit | Attending: Thoracic Surgery (Cardiothoracic Vascular Surgery) | Admitting: Thoracic Surgery (Cardiothoracic Vascular Surgery)

## 2014-02-15 ENCOUNTER — Encounter (HOSPITAL_COMMUNITY): Payer: Self-pay

## 2014-02-15 VITALS — BP 123/98 | HR 77 | Temp 97.6°F | Resp 18 | Ht 64.0 in | Wt 225.0 lb

## 2014-02-15 DIAGNOSIS — R222 Localized swelling, mass and lump, trunk: Secondary | ICD-10-CM | POA: Insufficient documentation

## 2014-02-15 DIAGNOSIS — R918 Other nonspecific abnormal finding of lung field: Secondary | ICD-10-CM

## 2014-02-15 DIAGNOSIS — J95811 Postprocedural pneumothorax: Secondary | ICD-10-CM | POA: Insufficient documentation

## 2014-02-15 LAB — GLUCOSE, CAPILLARY
GLUCOSE-CAPILLARY: 76 mg/dL (ref 70–99)
Glucose-Capillary: 97 mg/dL (ref 70–99)

## 2014-02-15 LAB — CBC
HCT: 35.7 % — ABNORMAL LOW (ref 36.0–46.0)
HEMOGLOBIN: 11.3 g/dL — AB (ref 12.0–15.0)
MCH: 28 pg (ref 26.0–34.0)
MCHC: 31.7 g/dL (ref 30.0–36.0)
MCV: 88.4 fL (ref 78.0–100.0)
Platelets: 355 10*3/uL (ref 150–400)
RBC: 4.04 MIL/uL (ref 3.87–5.11)
RDW: 14.7 % (ref 11.5–15.5)
WBC: 10.8 10*3/uL — AB (ref 4.0–10.5)

## 2014-02-15 LAB — PROTIME-INR
INR: 1.02 (ref 0.00–1.49)
PROTHROMBIN TIME: 13.4 s (ref 11.6–15.2)

## 2014-02-15 LAB — APTT: APTT: 32 s (ref 24–37)

## 2014-02-15 MED ORDER — MIDAZOLAM HCL 2 MG/2ML IJ SOLN
INTRAMUSCULAR | Status: AC
  Filled 2014-02-15: qty 4

## 2014-02-15 MED ORDER — LIDOCAINE HCL 1 % IJ SOLN
INTRAMUSCULAR | Status: AC
  Filled 2014-02-15: qty 10

## 2014-02-15 MED ORDER — SODIUM CHLORIDE 0.9 % IV SOLN
INTRAVENOUS | Status: DC
  Administered 2014-02-15: 10:00:00 via INTRAVENOUS

## 2014-02-15 MED ORDER — MIDAZOLAM HCL 2 MG/2ML IJ SOLN
INTRAMUSCULAR | Status: AC | PRN
  Administered 2014-02-15: 2 mg via INTRAVENOUS

## 2014-02-15 MED ORDER — FENTANYL CITRATE 0.05 MG/ML IJ SOLN
INTRAMUSCULAR | Status: AC | PRN
  Administered 2014-02-15: 50 ug via INTRAVENOUS

## 2014-02-15 MED ORDER — FENTANYL CITRATE 0.05 MG/ML IJ SOLN
INTRAMUSCULAR | Status: DC
Start: 2014-02-15 — End: 2014-02-16
  Filled 2014-02-15: qty 2

## 2014-02-15 NOTE — Sedation Documentation (Signed)
Patient denies pain and is resting comfortably.  

## 2014-02-15 NOTE — Procedures (Signed)
Successful RLL MASS CORE BX NO COMP STABLE PATH PENDING FULL REPORT IN PACS

## 2014-02-15 NOTE — Discharge Instructions (Signed)
Needle Biopsy of Lung, Care After °Refer to this sheet in the next few weeks. These instructions provide you with information on caring for yourself after your procedure. Your health care provider may also give you more specific instructions. Your treatment has been planned according to current medical practices, but problems sometimes occur. Call your health care provider if you have any problems or questions after your procedure. °WHAT TO EXPECT AFTER THE PROCEDURE °· A bandage will be applied over the area where the needle was inserted. You may be asked to apply pressure to the bandage for several minutes to ensure there is minimal bleeding. °· In most cases, you can leave when your needle biopsy procedure is completed. Do not drive yourself home. Someone else should take you home. °· If you received an IV sedative or general anesthetic, you will be taken to a comfortable place to relax while the medicine wears off. °· If you have upcoming travel scheduled, talk to your health care provider about when it is safe to travel by air after the procedure. °HOME CARE INSTRUCTIONS °· Expect to take it easy for the rest of the day. °· Protect the area where you received the needle biopsy by keeping the bandage in place for as long as instructed. °· You may feel some mild pain or discomfort in the area, but this should stop in a day or two. °· Take medicines only as directed by your health care provider. °SEEK MEDICAL CARE IF:  °· You have pain at the biopsy site that worsens or is not helped by medicine. °· You have swelling or drainage at the needle biopsy site. °· You have a fever. °SEEK IMMEDIATE MEDICAL CARE IF:  °· You have new or worsening shortness of breath. °· You have chest pain. °· You are coughing up blood. °· You have bleeding that does not stop with pressure or a bandage. °· You develop light-headedness or fainting. °Document Released: 04/27/2007 Document Revised: 11/14/2013 Document Reviewed:  11/22/2012 °Exit[employer]® Patient Information ©2015 [employer]. This information is not intended to replace advice given to you by your health care provider. Make sure you discuss any questions you have with your health care provider. ° °

## 2014-02-15 NOTE — H&P (Signed)
Chief Complaint: "I am here for a lung biopsy."  Referring Physician: Dr. Roxan Hockey HPI: Erica Barker is an 53 y.o. female with bilateral lung masses s/p EBUS that was positive for NSCLC. Additional tissue needed for immuno stains and genetic testing. Scheduled today for CT guided biopsy of RLL lung mass with moderate sedation. The patient denies any chest pain, shortness of breath or palpitations. She denies any active signs of bleeding or excessive bruising. She denies any recent fever or chills. The patient denies any history of sleep apnea or chronic oxygen use. She has previously tolerated sedation without complications.   Past Medical History:  Past Medical History  Diagnosis Date  . Hypertension   . Depression   . Pulmonary nodules 01/30/14    CTA CHEST  . Lymphadenopathy, mediastinal 01/2014    [place]  . Morbid obesity   . Diabetes mellitus, type II   . NIOEVOJJ(009.3)     Past Surgical History:  Past Surgical History  Procedure Laterality Date  . Cholecystectomy    . Cesarean section    . Video bronchoscopy with endobronchial ultrasound N/A 02/06/2014    Procedure: VIDEO BRONCHOSCOPY WITH ENDOBRONCHIAL ULTRASOUND,bronchial biopsies , node sampling;  Surgeon: Melrose Nakayama, MD;  Location: [facility] OR;  Service: Thoracic;  Laterality: N/A;    Family History:  Family History  Problem Relation Age of Onset  . Cancer Mother     UTERINE  . Cancer Father     LEUKEMIA    Social History:  reports that she quit smoking about 25 years ago. Her smoking use included Cigarettes. She has a 3.5 pack-year smoking history. She does not have any smokeless tobacco history on file. She reports that she drinks alcohol. She reports that she does not use illicit drugs.  Allergies:  Allergies  Allergen Reactions  . Asa [Aspirin] Hives    Medications:   Medication List    ASK your doctor about these medications       aspirin-acetaminophen-caffeine  250-250-65 MG per tablet  Commonly known as:  EXCEDRIN MIGRAINE  Take 2 tablets by mouth every 6 (six) hours as needed for headache.     lisinopril 10 MG tablet  Commonly known as:  PRINIVIL,ZESTRIL  Take 10 mg by mouth daily.     metFORMIN 500 MG tablet  Commonly known as:  GLUCOPHAGE  Take 500 mg by mouth daily with breakfast.     multivitamin capsule  Take 1 capsule by mouth daily.     oxyCODONE-acetaminophen 5-325 MG per tablet  Commonly known as:  PERCOCET/ROXICET  Take 1 tablet by mouth every 4 (four) hours as needed for severe pain.       Please HPI for pertinent positives, otherwise complete 10 system ROS negative.  Physical Exam: BP 117/86  Pulse 66  Temp(Src) 97.7 F (36.5 C) (Oral)  Resp 20  Ht 5\' 4"  (1.626 m)  Wt 225 lb (102.059 kg)  BMI 38.60 kg/m2  SpO2 100%  LMP 10/12/2013 Body mass index is 38.6 kg/(m^2).  General Appearance:  Alert, cooperative, no distress  Head:  Normocephalic, without obvious abnormality, atraumatic  Neck: Supple, symmetrical, trachea midline  Lungs:   Clear to auscultation bilaterally, no w/r/r, respirations unlabored without use of accessory muscles.  Chest Wall:  No tenderness or deformity  Heart:  Regular rate and rhythm, S1, S2 normal, no murmur, rub or gallop.  Abdomen:   Soft, non-tender, non distended, (+) BS  Extremities: Extremities normal, atraumatic, no cyanosis or  edema  Neurologic: Normal affect, no gross deficits.   Results for orders placed during the hospital encounter of 02/15/14 (from the past 48 hour(s))  APTT     Status: None   Collection Time    02/15/14  9:47 AM      Result Value Ref Range   aPTT 32  24 - 37 seconds  CBC     Status: Abnormal   Collection Time    02/15/14  9:47 AM      Result Value Ref Range   WBC 10.8 (*) 4.0 - 10.5 K/uL   RBC 4.04  3.87 - 5.11 MIL/uL   Hemoglobin 11.3 (*) 12.0 - 15.0 g/dL   HCT 35.7 (*) 36.0 - 46.0 %   MCV 88.4  78.0 - 100.0 fL   MCH 28.0  26.0 - 34.0 pg   MCHC  31.7  30.0 - 36.0 g/dL   RDW 14.7  11.5 - 15.5 %   Platelets 355  150 - 400 K/uL  PROTIME-INR     Status: None   Collection Time    02/15/14  9:47 AM      Result Value Ref Range   Prothrombin Time 13.4  11.6 - 15.2 seconds   INR 1.02  0.00 - 1.49  GLUCOSE, CAPILLARY     Status: None   Collection Time    02/15/14 10:15 AM      Result Value Ref Range   Glucose-Capillary 97  70 - 99 mg/dL   No results found.  Assessment/Plan Right lower lobe lung mass S/p EBUS consistent with NSCLC Need additional tissue for immuno stains and genetic testing. Scheduled today for CT guided biopsy of RLL lung mass with moderate sedation. Patient has been NPO, no blood thinners taken, labs and images reviewed.  Risks and Benefits discussed with the patient. All of the patient's questions were answered, patient is agreeable to proceed. Consent signed and in chart.   Tsosie Billing D PA-C 02/15/2014, 10:50 AM

## 2014-02-21 ENCOUNTER — Other Ambulatory Visit (HOSPITAL_COMMUNITY)
Admission: RE | Admit: 2014-02-21 | Discharge: 2014-02-21 | Disposition: A | Discharge status: Home / Self Care | Source: Ambulatory Visit | Attending: Internal Medicine | Admitting: Internal Medicine

## 2014-02-21 DIAGNOSIS — C3490 Malignant neoplasm of unspecified part of unspecified bronchus or lung: Secondary | ICD-10-CM | POA: Diagnosis present

## 2014-03-02 ENCOUNTER — Encounter (HOSPITAL_COMMUNITY): Payer: Self-pay

## 2014-07-24 LAB — PROTIME-INR

## 2014-07-31 LAB — PROTIME-INR: INR: 1.3 — AB (ref 0.9–1.1)

## 2014-10-03 ENCOUNTER — Ambulatory Visit (HOSPITAL_COMMUNITY): Admitting: Hematology & Oncology

## 2014-10-03 ENCOUNTER — Encounter (HOSPITAL_COMMUNITY): Attending: Hematology & Oncology | Admitting: Hematology & Oncology

## 2014-10-03 ENCOUNTER — Encounter (HOSPITAL_COMMUNITY): Payer: Self-pay | Admitting: Hematology & Oncology

## 2014-10-03 VITALS — BP 151/101 | HR 84 | Temp 98.8°F | Resp 20 | Ht 64.0 in | Wt 245.0 lb

## 2014-10-03 DIAGNOSIS — C7801 Secondary malignant neoplasm of right lung: Secondary | ICD-10-CM | POA: Diagnosis not present

## 2014-10-03 DIAGNOSIS — C7800 Secondary malignant neoplasm of unspecified lung: Secondary | ICD-10-CM | POA: Insufficient documentation

## 2014-10-03 DIAGNOSIS — Z808 Family history of malignant neoplasm of other organs or systems: Secondary | ICD-10-CM

## 2014-10-03 DIAGNOSIS — Z806 Family history of leukemia: Secondary | ICD-10-CM

## 2014-10-03 DIAGNOSIS — C189 Malignant neoplasm of colon, unspecified: Secondary | ICD-10-CM | POA: Insufficient documentation

## 2014-10-03 DIAGNOSIS — I82621 Acute embolism and thrombosis of deep veins of right upper extremity: Secondary | ICD-10-CM | POA: Diagnosis not present

## 2014-10-03 DIAGNOSIS — R110 Nausea: Secondary | ICD-10-CM

## 2014-10-03 DIAGNOSIS — C787 Secondary malignant neoplasm of liver and intrahepatic bile duct: Secondary | ICD-10-CM

## 2014-10-03 MED ORDER — PANTOPRAZOLE SODIUM 40 MG PO TBEC: 40.0000 mg | DELAYED_RELEASE_TABLET | Freq: Every day | ORAL | Status: DC

## 2014-10-03 NOTE — Progress Notes (Signed)
[place] NOTE  Patient Care Team: [name], [place] as PCP - General Melrose Nakayama, MD as Consulting Physician (Cardiothoracic Surgery)  CHIEF COMPLAINTS/PURPOSE OF CONSULTATION:  Stage IV CRC CT of abdomen and pelvis on 02/20/2014 at [facility] showing 3.6 cm apple core lesion in the proximal sigmoid colon 11 mm hypoenhancing lesion in the medial left hepatic dome, 4.1 cm right lower lobe mass and adjacent right lower lobe nodule CEA on 08/18/2014 of 93.7 ng/ml Right upper extremity DVT 04/17/2014 paired brachial veins, axillary vein, and peripheral aspect of the right subclavian vein CT chest 07/06/2014 with bilateral pulmonary nodules and masses, RUL, lateral RUL, posterior LUL, lobulated nodule in the subpleural RLL,    HISTORY OF PRESENTING ILLNESS:   Erica Barker 54 y.o. female is here because of age for colorectal cancer. He reports she was taking a CPR class and developed shortness of breath. 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 She states she went to the court case and immediately afterwards went to the emergency room. She had a CT scan of the chest that showed an abnormality in her lung, she had evidence of massive subcarinal adenopathy and bilateral pulmonary nodules. She saw Dr. Roxan Hockey on 02/03/2014. She underwent an EBUS which was positive for NSCLC. Based on the limited tissue she ultimately underwent a CT-guided biopsy of the right lower lobe mass. Tumor was positive for CDX-2 and CK 20, c/w colon primary.   She states she had blood in her stool for some time but had always been told it was hemorrhoids. Her initial chemotherapy was with FOLFOX. She has tried Xeloda and developed severe nausea and vomiting. She notes she has a terrible time with her chemotherapy. She needs about a week recovery time. She describes severe fatigue that limits her ability to do her ADLs. She has  nausea and vomiting that starts shortly after initiation of her chemotherapy and continue for at least 5 days. She takes multiple anti-emetics but remains very nauseated. She notes she ends up in the emergency department fairly frequently at [facility]. She goes there to get IV anti-emetics and IV fluids. She states that she is unable to eat very much the first week after chemotherapy but once she can eat, "she eats."  She wants to continue treatment but feels she is not having good quality of life. She is here today for second opinion.   MEDICAL HISTORY:  Past Medical History  Diagnosis Date  . Hypertension   . Depression   . Pulmonary nodules 01/30/14    CTA CHEST  . Lymphadenopathy, mediastinal 01/2014    CTA ANGIO .Marland KitchenRacine  . Morbid obesity   . Diabetes mellitus, type II   . Headache(784.0)     SURGICAL HISTORY: Past Surgical History  Procedure Laterality Date  . Cholecystectomy    . Video bronchoscopy with endobronchial ultrasound N/A 02/06/2014    Procedure: VIDEO BRONCHOSCOPY WITH ENDOBRONCHIAL ULTRASOUND,bronchial biopsies , node sampling;  Surgeon: Melrose Nakayama, MD;  Location: [facility];  Service: Thoracic;  Laterality: N/A;  . Tubal ligation      SOCIAL HISTORY: History   Social History  . Marital Status: Married    Spouse Name: N/A  . Number of Children: N/A  . Years of Education: N/A   Occupational History  . Not on file.   Social History Main Topics  . Smoking status: Former Smoker -- 0.50 packs/day for  7 years    Types: Cigarettes    Quit date: 02/03/1989  . Smokeless tobacco: Not on file  . Alcohol Use: Yes     Comment: occasional  . Drug Use: No  . Sexual Activity: Not on file   Other Topics Concern  . Not on file   Social History Narrative  She is in a relationship. 3 children. Aged 32,35,20.  She has no grandchildren. She smoked in high school but quit after graduation. No ETOH. She was a Animal nutritionist at [employer]. She has  also worked as a Web designer.  She is originally from [place].  FAMILY HISTORY: Family History  Problem Relation Age of Onset  . Cancer Mother     UTERINE  . Cancer Father     LEUKEMIA  . Crohn's disease Son    indicated that her mother is alive. She indicated that her father is deceased. She indicated that her son is alive.   Father is deceased at age 46 from leukemia, mother is alive at 43 with a history of uterine cancer.  She has one sister how is healthy and one brother who is healthy. He works as a Engineer, structural.   ALLERGIES:  is allergic to asa.  MEDICATIONS:  Current Outpatient Prescriptions  Medication Sig Dispense Refill  . aspirin-acetaminophen-caffeine (EXCEDRIN MIGRAINE) 250-250-65 MG per tablet Take 2 tablets by mouth every 6 (six) hours as needed for headache.    . metFORMIN (GLUCOPHAGE) 500 MG tablet Take 500 mg by mouth daily with breakfast.     . Multiple Vitamin (MULTIVITAMIN) capsule Take 1 capsule by mouth daily.    Marland Kitchen oxyCODONE-acetaminophen (PERCOCET/ROXICET) 5-325 MG per tablet Take 1 tablet by mouth every 4 (four) hours as needed for severe pain.     . fluorouracil CALGB 36644 in sodium chloride 0.9 % 150 mL Inject into the vein. To infuse over 46 hours every 14 days    . LEUCOVORIN CALCIUM IV Inject into the vein every 14 (fourteen) days.    Marland Kitchen lidocaine-prilocaine (EMLA) cream Apply a quarter size amount to port site 1 hour prior to chemo. Do not rub in. Cover with plastic wrap. 30 g 3  . lisinopril (PRINIVIL,ZESTRIL) 10 MG tablet Take 1 tablet (10 mg total) by mouth daily. 30 tablet 6  . ondansetron (ZOFRAN) 8 MG tablet Take 1 tablet (8 mg total) by mouth every 8 (eight) hours as needed for nausea or vomiting. 30 tablet 2  . OXALIPLATIN IV Inject into the vein every 14 (fourteen) days.    . pantoprazole (PROTONIX) 40 MG tablet Take 1 tablet (40 mg total) by mouth daily. 30 tablet 3  . prochlorperazine (COMPAZINE) 10 MG tablet Take 1 tablet (10 mg total) by  mouth every 6 (six) hours as needed (Nausea or vomiting). 30 tablet 2  . promethazine (PHENERGAN) 25 MG suppository Place 1 suppository (25 mg total) rectally every 6 (six) hours as needed for nausea or vomiting. 12 each 0   No current facility-administered medications for this visit.    Review of Systems  Constitutional: Negative for fever, chills, weight loss and malaise/fatigue.  HENT: Negative for congestion, hearing loss, nosebleeds, sore throat and tinnitus.   Eyes: Negative for blurred vision, double vision, pain and discharge.  Respiratory: Negative for cough, hemoptysis, sputum production, shortness of breath and wheezing.   Cardiovascular: Negative for chest pain, palpitations, claudication, leg swelling and PND.  Gastrointestinal: Negative for heartburn, nausea, vomiting, abdominal pain, diarrhea, constipation, blood in stool  and melena.  Genitourinary: Negative for dysuria, urgency, frequency and hematuria.  Musculoskeletal: Negative for myalgias, joint pain and falls.  Skin: Negative for itching and rash.  Neurological: Negative for dizziness, tingling, tremors, sensory change, speech change, focal weakness, seizures, loss of consciousness, weakness and headaches.  Endo/Heme/Allergies: Does not bruise/bleed easily.  Psychiatric/Behavioral: Negative for depression, suicidal ideas, memory loss and substance abuse. The patient is not nervous/anxious and does not have insomnia.     PHYSICAL EXAMINATION: ECOG PERFORMANCE STATUS: 1 - Symptomatic but completely ambulatory  Filed Vitals:   10/03/14 1300  BP: 151/101  Pulse: 84  Temp: 98.8 F (37.1 C)  Resp: 20   Filed Weights   10/03/14 1300  Weight: 245 lb (111.131 kg)     Physical Exam  Constitutional: She is oriented to person, place, and time and well-developed, well-nourished, and in no distress.  HENT:  Head: Normocephalic and atraumatic.  Nose: Nose normal.  Mouth/Throat: Oropharynx is clear and moist. No  oropharyngeal exudate.  Eyes: Conjunctivae and EOM are normal. Pupils are equal, round, and reactive to light. Right eye exhibits no discharge. Left eye exhibits no discharge. No scleral icterus.  Neck: Normal range of motion. Neck supple. No tracheal deviation present. No thyromegaly present.  Cardiovascular: Normal rate, regular rhythm and normal heart sounds.  Exam reveals no gallop and no friction rub.   No murmur heard. Pulmonary/Chest: Effort normal and breath sounds normal. She has no wheezes. She has no rales.  Abdominal: Soft. Bowel sounds are normal. She exhibits no distension and no mass. There is no tenderness. There is no rebound and no guarding.  Musculoskeletal: Normal range of motion. She exhibits no edema.  Lymphadenopathy:    She has no cervical adenopathy.  Neurological: She is alert and oriented to person, place, and time. She has normal reflexes. No cranial nerve deficit. Gait normal. Coordination normal.  Skin: Skin is warm and dry. No rash noted.  Psychiatric: Mood, memory, affect and judgment normal.  Nursing note and vitals reviewed.    LABORATORY DATA:  I have reviewed the data as listed Results for Erica, Barker (MRN [id number]) as of 11/09/2014 18:18  Ref. Range 10/11/2014 09:12  Sodium Latest Ref Range: 135-145 mmol/L 140  Potassium Latest Ref Range: 3.5-5.1 mmol/L 3.8  Chloride Latest Ref Range: 96-112 mmol/L 105  CO2 Latest Ref Range: 19-32 mmol/L 26  BUN Latest Ref Range: 6-23 mg/dL 11  Creatinine Latest Ref Range: 0.50-1.10 mg/dL 0.68  Calcium Latest Ref Range: 8.4-10.5 mg/dL 9.3  EGFR (Non-African Amer.) Latest Ref Range: >90 mL/min >90  EGFR (African American) Latest Ref Range: >90 mL/min >90  Glucose Latest Ref Range: 70-99 mg/dL 128 (H)  Anion gap Latest Ref Range: 5-15  9  Alkaline Phosphatase Latest Ref Range: 39-117 U/L 72  Albumin Latest Ref Range: 3.5-5.2 g/dL 3.6  AST Latest Ref Range: 0-37 U/L 17  ALT Latest Ref Range: 0-35 U/L 14    Total Protein Latest Ref Range: 6.0-8.3 g/dL 7.9  Total Bilirubin Latest Ref Range: 0.3-1.2 mg/dL 0.3  WBC Latest Ref Range: 4.0-10.5 K/uL 9.8  RBC Latest Ref Range: 3.87-5.11 MIL/uL 4.16  Hemoglobin Latest Ref Range: 12.0-15.0 g/dL 12.7  HCT Latest Ref Range: 36.0-46.0 % 38.6  MCV Latest Ref Range: 78.0-100.0 fL 92.8  MCH Latest Ref Range: 26.0-34.0 pg 30.5  MCHC Latest Ref Range: 30.0-36.0 g/dL 32.9  RDW Latest Ref Range: 11.5-15.5 % 14.2  Platelets Latest Ref Range: 150-400 K/uL 175  Neutrophils Latest Ref Range: 43-77 %  62  Lymphocytes Latest Ref Range: 12-46 % 32  Monocytes Relative Latest Ref Range: 3-12 % 8  Eosinophil Latest Ref Range: 0-5 % 2  Basophil Latest Ref Range: 0-1 % 0  NEUT# Latest Ref Range: 1.7-7.7 K/uL 5.7  Lymphocyte # Latest Ref Range: 0.7-4.0 K/uL 3.2  Monocyte # Latest Ref Range: 0.1-1.0 K/uL 0.8  Eosinophils Absolute Latest Ref Range: 0.0-0.7 K/uL 0.2  Basophils Absolute Latest Ref Range: 0.0-0.1 K/uL 0.0    RADIOGRAPHIC STUDIES:  I have personally reviewed the radiology reports from [facility], images must be requested for future reference.  I have personally reviewed the images from the CT biopsy as detailed below (02/15/2014)  CLINICAL DATA: Bilateral pulmonary nodules, mediastinal adenopathy, additional sampling for Foundation 1 testing  EXAM: CT GUIDED CORE BIOPSY OF RIGHT LOWER LOBE MASS  FINDINGS: Imaging confirms needle placed in in the posterior right lower lobe mass for core biopsy  IMPRESSION: Successful CT-guided right lower lobe posterior mass core biopsies   Electronically Signed  By: Daryll Brod M.D.  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 She has been treated with FOLFOX and Avastin and per  available records appears to be responding as indicated by CT of the chest in December and a decline in her CEA. She has difficulty tolerating her chemotherapy with severe nausea and vomiting and excessive fatigue of 5-7 days' duration. She has frequent ER visits for hydration.  Per her records she does not appear to have ever had a dose reduction in her oxaliplatin and I would certainly recommend that. I would make sure she had Emend, Aloxi and dexamethasone with each cycle. A dose of Aloxi can be given at the time of pump removal. I advised the patients we also have other options including Zyprexa, Zofran patch, or ABHR gel. I would like to try to find a way for her to tolerate her therapy prior to discontinuing it and have encouraged her to try an additional cycle with a dose reduction of oxaliplatin and additional anti-emetics.  I will need to obtain copies of her imaging studies on CD-ROM for future reference. We will obtain baseline laboratory studies on her today including a CEA. We spent a great deal of time in discussion today regarding prognosis. We talked about the goals of therapy and her personal goals. We will plan on initiating her therapy as detailed above, she agrees to try an additional cycle with our recommendations. She would like to transfer her care here to [facility].   All questions were answered. The patient knows to call the clinic with any problems, questions or concerns. This note was electronically signed.    Molli Hazard, MD 11/09/2014 6:17 PM

## 2014-10-03 NOTE — Patient Instructions (Signed)
[place] at [facility] Discharge Instructions  RECOMMENDATIONS MADE BY THE CONSULTANT AND ANY TEST RESULTS WILL BE SENT TO YOUR REFERRING PHYSICIAN.  We will get your chemotherapy records from [facility]. We will plan to start chemotherapy here next week. Return as scheduled.  Thank you for choosing [place] at [facility] to provide your oncology and hematology care.  To afford each patient quality time with our provider, please arrive at least 15 minutes before your scheduled appointment time.    You need to re-schedule your appointment should you arrive 10 or more minutes late.  We strive to give you quality time with our providers, and arriving late affects you and other patients whose appointments are after yours.  Also, if you no show three or more times for appointments you may be dismissed from the clinic at the providers discretion.     Again, thank you for choosing [facility]  Our hope is that these requests will decrease the amount of time that you wait before being seen by our physicians.       _____________________________________________________________  Should you have questions after your visit to [facility], please contact our office at [phone] between the hours of 8:30 a.m. and 4:30 p.m.  Voicemails left after 4:30 p.m. will not be returned until the following business day.  For prescription refill requests, have your pharmacy contact our office.

## 2014-10-09 ENCOUNTER — Other Ambulatory Visit (HOSPITAL_COMMUNITY): Payer: Self-pay | Admitting: Hematology & Oncology

## 2014-10-09 DIAGNOSIS — C189 Malignant neoplasm of colon, unspecified: Secondary | ICD-10-CM | POA: Insufficient documentation

## 2014-10-09 DIAGNOSIS — C7800 Secondary malignant neoplasm of unspecified lung: Principal | ICD-10-CM | POA: Insufficient documentation

## 2014-10-09 MED ORDER — PROCHLORPERAZINE MALEATE 10 MG PO TABS: 10.0000 mg | ORAL_TABLET | Freq: Four times a day (QID) | ORAL | Status: DC | PRN

## 2014-10-09 MED ORDER — ONDANSETRON HCL 8 MG PO TABS: 8.0000 mg | ORAL_TABLET | Freq: Three times a day (TID) | ORAL | Status: DC | PRN

## 2014-10-09 MED ORDER — LIDOCAINE-PRILOCAINE 2.5-2.5 % EX CREA: TOPICAL_CREAM | CUTANEOUS | Status: DC

## 2014-10-09 NOTE — Patient Instructions (Addendum)
[place]   CHEMOTHERAPY INSTRUCTIONS  Premeds: Zofran - to reduce/prevent nausea/vomiting. Dexamethasone - steroid - to reduce/prevent the risk of you having an allergic type reaction to the Oxaliplatin chemotherapy. Side Effects of steroids - your face can turn red or feel flushed, you may be anxious/jittery/nervous, or have trouble sleeping. These side effects should pass as the steorid wears off. (takes 30 minutes to infuse)  Oxaliplatin - anaphylactic reaction, neurotoxicity (i.e., headache, fatigue, difficulty sleeping, pain). Peripheral neuropathy (numbness/tingling/burning in hands/fingers/feet/toes) - will be aggravated by cold/cool temperatures. We need to know when   you develop peripheral neuropathy so that we can monitor it and treat if necessary. Nausea/vomiting, diarrhea, bone marrow suppression (lowers white blood cells (fight infection), lowers red blood cells (make up your blood), lowers platelets (help blood to clot). Pulmonary fibrosis. Once you have received Oxaliplatin do NOT eat or drinking anything cold/cool for 5-10 days! Do NOT breathe in cold/cool air and do NOT touch anything cold for 5-10 days. The time frame varies from patient to patient on the length of time you must abstain from the above mentioned. Best advice is to wait at least 5 days before attempting to reintroduce cold/cool back into life. Slowly reintroduce cool/cold things! Wear gloves when getting items out of the refrigerator (of course, these would be things you are going to heat to eat)! (takes 2 hours to infuse)  Leucovorin - this is a medication that is not chemo but given with chemo. This med "rescues" the healthy cells before we administer the drug 5FU. This makes the 5FU work better. (takes 2 hours to infuse - infuses while the Oxaliplatin infuses)  5FU: bone marrow suppression (low white blood cells - wbcs fight infection, low red blood cells - rbcs make up your  blood, low platelets - this is what makes your blood clot, nausea/vomiting, diarrhea, mouth sores, hair loss, dry skin, ocular toxicities (increased tear production, sensitivity to light). You must wear sunscreen/sunglasses. Cover your skin when out in sunlight. You will get burned very easily.  (takes 10 minutes for the nurse to give this via IV push, then the nurse connects a bag of 5FU to your ambulatory pump and you wear it for 46 hours)  POTENTIAL SIDE EFFECTS OF TREATMENT: Increased Susceptibility to Infection, Vomiting, Constipation, Hair Thinning, Changes in Character of Skin and Nails (brittleness, dryness,etc.), Bone Marrow Suppression, Nausea, Diarrhea, Sun Sensitivity and Mouth Sores   EDUCATIONAL MATERIALS GIVEN AND REVIEWED: Chemotherapy and You booklet Specific Instructions Sheets: Oxaliplatin, Leucovorin, 5FU, Zofran, Dexamethasone, EMLA cream, Zofran tablets, Compazine tablets   SELF CARE ACTIVITIES WHILE ON CHEMOTHERAPY: Increase your fluid intake 48 hours prior to treatment and drink at least 2 quarts per day after treatment., No alcohol intake., No aspirin or other medications unless approved by your oncologist., Eat foods that are light and easy to digest., No fried, fatty, or spicy foods immediately before or after treatment., Have teeth cleaned professionally before starting treatment. Keep dentures and partial plates clean., Use soft toothbrush and do not use mouthwashes that contain alcohol. Biotene is a good mouthwash that is available at most pharmacies or may be ordered by calling [phone]., Use warm salt water gargles (1 teaspoon salt per 1 quart warm water) before and after meals and at bedtime. Or you may rinse with 2 tablespoons of three -percent hydrogen peroxide mixed in eight ounces of water., Always use sunscreen with SPF (Sun Protection Factor) of 30 or higher., Use your nausea  medication as directed to prevent nausea., Use your stool softener or laxative as  directed to prevent constipation. and Use your anti-diarrheal medication as directed to stop diarrhea.  Please wash your hands for at least 30 seconds using warm soapy water. Handwashing is the #1 way to prevent the spread of germs. Stay away from sick people or people who are getting over a cold. If you develop respiratory systems such as green/yellow mucus production or productive cough or persistent cough let us know and we will see if you need an antibiotic. It is a good idea to keep a pair of gloves on when going into grocery stores/[employer] to decrease your risk of coming into contact with germs on the carts, etc. Carry alcohol hand gel with you at all times and use it frequently if out in public. All foods need to be cooked thoroughly. No raw foods. No medium or undercooked meats, eggs. If your food is cooked medium well, it does not need to be hot pink or saturated with bloody liquid at all. Vegetables and fruits need to be washed/rinsed under the faucet with a dish detergent before being consumed. You can eat raw fruits and vegetables unless we tell you otherwise but it would be best if you cooked them or bought frozen. Do not eat off of salad bars or hot bars unless you really trust the cleanliness of the restaurant. If you need dental work, please let Dr. Whitney Muse know before you go for your appointment so that we can coordinate the best possible time for you in regards to your chemo regimen. You need to also let your dentist know that you are actively taking chemo. We may need to do labs prior to your dental appointment. We also want your bowels moving at least every other day. If this is not happening, we need to know so that we can get you on a bowel regimen to help you go.     MEDICATIONS: You have been given prescriptions for the following medications:  Zofran/Ondansetron 8mg  tablet. Take 1 tablet every 8 hours as needed for nausea/vomiting.   Compazine/prochlorperazine 10mg  tablet. Take 1  tablet every 6 hours as needed for nausea/vomiting.   EMLA cream. Apply a quarter size amount to port site 1 hour prior to chemo. Do not rub in. Cover with plastic wrap.  Over-the-Counter Meds:  Senna - this is a mild laxative used to treat mild constipation. May take 4 tablets twice a day as needed for mild constipation.  Milk of Magnesia - this is a laxative used to treat moderate to severe constipation. May take 2-4 tablespoons every 8 hours as needed. May increase to 8 tablespoons x 1 dose and if no bowel movement call the [facility].  Imodium - this is for diarrhea. Take 2 tabs after 1st loose stool and then 1 tab every 2 hours until you go a total of 12 hours without a loose stool. Call [facility] if loose stools continue.          SYMPTOMS TO REPORT AS SOON AS POSSIBLE AFTER TREATMENT:  FEVER GREATER THAN 100.5 F  CHILLS WITH OR WITHOUT FEVER  NAUSEA AND VOMITING THAT IS NOT CONTROLLED WITH YOUR NAUSEA MEDICATION  UNUSUAL SHORTNESS OF BREATH  UNUSUAL BRUISING OR BLEEDING  TENDERNESS IN MOUTH AND THROAT WITH OR WITHOUT PRESENCE OF ULCERS  URINARY PROBLEMS  BOWEL PROBLEMS  UNUSUAL RASH    Wear comfortable clothing and clothing appropriate for easy access to any Portacath or  PICC line. Let us know if there is anything that we can do to make your therapy better!      I have been informed and understand all of the instructions given to me and have received a copy. I have been instructed to call the clinic [phone] or my family physician as soon as possible for continued medical care, if indicated. I do not have any more questions at this time but understand that I may call the [facility] or the Patient Navigator at [phone] during office hours should I have questions or need assistance in obtaining follow-up care.          Oxaliplatin Injection What is this medicine? OXALIPLATIN (ox AL i PLA tin) is a chemotherapy drug. It targets fast  dividing cells, like cancer cells, and causes these cells to die. This medicine is used to treat cancers of the colon and rectum, and many other cancers. This medicine may be used for other purposes; ask your health care provider or pharmacist if you have questions. COMMON BRAND NAME(S): Eloxatin What should I tell my health care provider before I take this medicine? They need to know if you have any of these conditions: -kidney disease -an unusual or allergic reaction to oxaliplatin, other chemotherapy, other medicines, foods, dyes, or preservatives -pregnant or trying to get pregnant -breast-feeding How should I use this medicine? This drug is given as an infusion into a vein. It is administered in a hospital or clinic by a specially trained health care professional. Talk to your pediatrician regarding the use of this medicine in children. Special care may be needed. Overdosage: If you think you have taken too much of this medicine contact a poison control center or emergency room at once. NOTE: This medicine is only for you. Do not share this medicine with others. What if I miss a dose? It is important not to miss a dose. Call your doctor or health care professional if you are unable to keep an appointment. What may interact with this medicine? -medicines to increase blood counts like filgrastim, pegfilgrastim, sargramostim -probenecid -some antibiotics like amikacin, gentamicin, neomycin, polymyxin B, streptomycin, tobramycin -zalcitabine Talk to your doctor or health care professional before taking any of these medicines: -acetaminophen -aspirin -ibuprofen -ketoprofen -naproxen This list may not describe all possible interactions. Give your health care provider a list of all the medicines, herbs, non-prescription drugs, or dietary supplements you use. Also tell them if you smoke, drink alcohol, or use illegal drugs. Some items may interact with your medicine. What should I watch for  while using this medicine? Your condition will be monitored carefully while you are receiving this medicine. You will need important blood work done while you are taking this medicine. This medicine can make you more sensitive to cold. Do not drink cold drinks or use ice. Cover exposed skin before coming in contact with cold temperatures or cold objects. When out in cold weather wear warm clothing and cover your mouth and nose to warm the air that goes into your lungs. Tell your doctor if you get sensitive to the cold. This drug may make you feel generally unwell. This is not uncommon, as chemotherapy can affect healthy cells as well as cancer cells. Report any side effects. Continue your course of treatment even though you feel ill unless your doctor tells you to stop. In some cases, you may be given additional medicines to help with side effects. Follow all directions for their use. Call  your doctor or health care professional for advice if you get a fever, chills or sore throat, or other symptoms of a cold or flu. Do not treat yourself. This drug decreases your body's ability to fight infections. Try to avoid being around people who are sick. This medicine may increase your risk to bruise or bleed. Call your doctor or health care professional if you notice any unusual bleeding. Be careful brushing and flossing your teeth or using a toothpick because you may get an infection or bleed more easily. If you have any dental work done, tell your dentist you are receiving this medicine. Avoid taking products that contain aspirin, acetaminophen, ibuprofen, naproxen, or ketoprofen unless instructed by your doctor. These medicines may hide a fever. Do not become pregnant while taking this medicine. Women should inform their doctor if they wish to become pregnant or think they might be pregnant. There is a potential for serious side effects to an unborn child. Talk to your health care professional or pharmacist for  more information. Do not breast-feed an infant while taking this medicine. Call your doctor or health care professional if you get diarrhea. Do not treat yourself. What side effects may I notice from receiving this medicine? Side effects that you should report to your doctor or health care professional as soon as possible: -allergic reactions like skin rash, itching or hives, swelling of the face, lips, or tongue -low blood counts - This drug may decrease the number of white blood cells, red blood cells and platelets. You may be at increased risk for infections and bleeding. -signs of infection - fever or chills, cough, sore throat, pain or difficulty passing urine -signs of decreased platelets or bleeding - bruising, pinpoint red spots on the skin, black, tarry stools, nosebleeds -signs of decreased red blood cells - unusually weak or tired, fainting spells, lightheadedness -breathing problems -chest pain, pressure -cough -diarrhea -jaw tightness -mouth sores -nausea and vomiting -pain, swelling, redness or irritation at the injection site -pain, tingling, numbness in the hands or feet -problems with balance, talking, walking -redness, blistering, peeling or loosening of the skin, including inside the mouth -trouble passing urine or change in the amount of urine Side effects that usually do not require medical attention (report to your doctor or health care professional if they continue or are bothersome): -changes in vision -constipation -hair loss -loss of appetite -metallic taste in the mouth or changes in taste -stomach pain This list may not describe all possible side effects. Call your doctor for medical advice about side effects. You may report side effects to FDA at 1-800-FDA-1088. Where should I keep my medicine? This drug is given in a hospital or clinic and will not be stored at home. NOTE: This sheet is a summary. It may not cover all possible information. If you have  questions about this medicine, talk to your doctor, pharmacist, or health care provider.  2015, Elsevier/Gold Standard. (2008-01-25 17:22:47) Leucovorin injection What is this medicine? LEUCOVORIN (loo koe VOR in) is used to prevent or treat the harmful effects of some medicines. This medicine is used to treat anemia caused by a low amount of folic acid in the body. It is also used with 5-fluorouracil (5-FU) to treat colon cancer. This medicine may be used for other purposes; ask your health care provider or pharmacist if you have questions. What should I tell my health care provider before I take this medicine? They need to know if you have any of these conditions: -  anemia from low levels of vitamin B-12 in the blood -an unusual or allergic reaction to leucovorin, folic acid, other medicines, foods, dyes, or preservatives -pregnant or trying to get pregnant -breast-feeding How should I use this medicine? This medicine is for injection into a muscle or into a vein. It is given by a health care professional in a hospital or clinic setting. Talk to your pediatrician regarding the use of this medicine in children. Special care may be needed. Overdosage: If you think you have taken too much of this medicine contact a poison control center or emergency room at once. NOTE: This medicine is only for you. Do not share this medicine with others. What if I miss a dose? This does not apply. What may interact with this medicine? -capecitabine -fluorouracil -phenobarbital -phenytoin -primidone -trimethoprim-sulfamethoxazole This list may not describe all possible interactions. Give your health care provider a list of all the medicines, herbs, non-prescription drugs, or dietary supplements you use. Also tell them if you smoke, drink alcohol, or use illegal drugs. Some items may interact with your medicine. What should I watch for while using this medicine? Your condition will be monitored carefully  while you are receiving this medicine. This medicine may increase the side effects of 5-fluorouracil, 5-FU. Tell your doctor or health care professional if you have diarrhea or mouth sores that do not get better or that get worse. What side effects may I notice from receiving this medicine? Side effects that you should report to your doctor or health care professional as soon as possible: -allergic reactions like skin rash, itching or hives, swelling of the face, lips, or tongue -breathing problems -fever, infection -mouth sores -unusual bleeding or bruising -unusually weak or tired Side effects that usually do not require medical attention (report to your doctor or health care professional if they continue or are bothersome): -constipation or diarrhea -loss of appetite -nausea, vomiting This list may not describe all possible side effects. Call your doctor for medical advice about side effects. You may report side effects to FDA at 1-800-FDA-1088. Where should I keep my medicine? This drug is given in a hospital or clinic and will not be stored at home. NOTE: This sheet is a summary. It may not cover all possible information. If you have questions about this medicine, talk to your doctor, pharmacist, or health care provider.  2015, Elsevier/Gold Standard. (2008-01-04 16:50:29) Fluorouracil, 5-FU injection What is this medicine? FLUOROURACIL, 5-FU (flure oh YOOR a sil) is a chemotherapy drug. It slows the growth of cancer cells. This medicine is used to treat many types of cancer like breast cancer, colon or rectal cancer, pancreatic cancer, and stomach cancer. This medicine may be used for other purposes; ask your health care provider or pharmacist if you have questions. COMMON BRAND NAME(S): Adrucil What should I tell my health care provider before I take this medicine? They need to know if you have any of these conditions: -blood disorders -dihydropyrimidine dehydrogenase (DPD)  deficiency -infection (especially a virus infection such as chickenpox, cold sores, or herpes) -kidney disease -liver disease -malnourished, poor nutrition -recent or ongoing radiation therapy -an unusual or allergic reaction to fluorouracil, other chemotherapy, other medicines, foods, dyes, or preservatives -pregnant or trying to get pregnant -breast-feeding How should I use this medicine? This drug is given as an infusion or injection into a vein. It is administered in a hospital or clinic by a specially trained health care professional. Talk to your pediatrician regarding the use of this  medicine in children. Special care may be needed. Overdosage: If you think you have taken too much of this medicine contact a poison control center or emergency room at once. NOTE: This medicine is only for you. Do not share this medicine with others. What if I miss a dose? It is important not to miss your dose. Call your doctor or health care professional if you are unable to keep an appointment. What may interact with this medicine? -allopurinol -cimetidine -dapsone -digoxin -hydroxyurea -leucovorin -levamisole -medicines for seizures like ethotoin, fosphenytoin, phenytoin -medicines to increase blood counts like filgrastim, pegfilgrastim, sargramostim -medicines that treat or prevent blood clots like warfarin, enoxaparin, and dalteparin -methotrexate -metronidazole -pyrimethamine -some other chemotherapy drugs like busulfan, cisplatin, estramustine, vinblastine -trimethoprim -trimetrexate -vaccines Talk to your doctor or health care professional before taking any of these medicines: -acetaminophen -aspirin -ibuprofen -ketoprofen -naproxen This list may not describe all possible interactions. Give your health care provider a list of all the medicines, herbs, non-prescription drugs, or dietary supplements you use. Also tell them if you smoke, drink alcohol, or use illegal drugs. Some items  may interact with your medicine. What should I watch for while using this medicine? Visit your doctor for checks on your progress. This drug may make you feel generally unwell. This is not uncommon, as chemotherapy can affect healthy cells as well as cancer cells. Report any side effects. Continue your course of treatment even though you feel ill unless your doctor tells you to stop. In some cases, you may be given additional medicines to help with side effects. Follow all directions for their use. Call your doctor or health care professional for advice if you get a fever, chills or sore throat, or other symptoms of a cold or flu. Do not treat yourself. This drug decreases your body's ability to fight infections. Try to avoid being around people who are sick. This medicine may increase your risk to bruise or bleed. Call your doctor or health care professional if you notice any unusual bleeding. Be careful brushing and flossing your teeth or using a toothpick because you may get an infection or bleed more easily. If you have any dental work done, tell your dentist you are receiving this medicine. Avoid taking products that contain aspirin, acetaminophen, ibuprofen, naproxen, or ketoprofen unless instructed by your doctor. These medicines may hide a fever. Do not become pregnant while taking this medicine. Women should inform their doctor if they wish to become pregnant or think they might be pregnant. There is a potential for serious side effects to an unborn child. Talk to your health care professional or pharmacist for more information. Do not breast-feed an infant while taking this medicine. Men should inform their doctor if they wish to father a child. This medicine may lower sperm counts. Do not treat diarrhea with over the counter products. Contact your doctor if you have diarrhea that lasts more than 2 days or if it is severe and watery. This medicine can make you more sensitive to the sun. Keep out  of the sun. If you cannot avoid being in the sun, wear protective clothing and use sunscreen. Do not use sun lamps or tanning beds/booths. What side effects may I notice from receiving this medicine? Side effects that you should report to your doctor or health care professional as soon as possible: -allergic reactions like skin rash, itching or hives, swelling of the face, lips, or tongue -low blood counts - this medicine may decrease the number of  white blood cells, red blood cells and platelets. You may be at increased risk for infections and bleeding. -signs of infection - fever or chills, cough, sore throat, pain or difficulty passing urine -signs of decreased platelets or bleeding - bruising, pinpoint red spots on the skin, black, tarry stools, blood in the urine -signs of decreased red blood cells - unusually weak or tired, fainting spells, lightheadedness -breathing problems -changes in vision -chest pain -mouth sores -nausea and vomiting -pain, swelling, redness at site where injected -pain, tingling, numbness in the hands or feet -redness, swelling, or sores on hands or feet -stomach pain -unusual bleeding Side effects that usually do not require medical attention (report to your doctor or health care professional if they continue or are bothersome): -changes in finger or toe nails -diarrhea -dry or itchy skin -hair loss -headache -loss of appetite -sensitivity of eyes to the light -stomach upset -unusually teary eyes This list may not describe all possible side effects. Call your doctor for medical advice about side effects. You may report side effects to FDA at 1-800-FDA-1088. Where should I keep my medicine? This drug is given in a hospital or clinic and will not be stored at home. NOTE: This sheet is a summary. It may not cover all possible information. If you have questions about this medicine, talk to your doctor, pharmacist, or health care provider.  2015,  [employer] Standard. (2007-11-03 13:53:16) Ondansetron injection What is this medicine? ONDANSETRON (on DAN se tron) is used to treat nausea and vomiting caused by chemotherapy. It is also used to prevent or treat nausea and vomiting after surgery. This medicine may be used for other purposes; ask your health care provider or pharmacist if you have questions. COMMON BRAND NAME(S): Zofran What should I tell my health care provider before I take this medicine? They need to know if you have any of these conditions: -heart disease -history of irregular heartbeat -liver disease -low levels of magnesium or potassium in the blood -an unusual or allergic reaction to ondansetron, granisetron, other medicines, foods, dyes, or preservatives -pregnant or trying to get pregnant -breast-feeding How should I use this medicine? This medicine is for infusion into a vein. It is given by a health care professional in a hospital or clinic setting. Talk to your pediatrician regarding the use of this medicine in children. Special care may be needed. Overdosage: If you think you have taken too much of this medicine contact a poison control center or emergency room at once. NOTE: This medicine is only for you. Do not share this medicine with others. What if I miss a dose? This does not apply. What may interact with this medicine? Do not take this medicine with any of the following medications: -apomorphine -certain medicines for fungal infections like fluconazole, itraconazole, ketoconazole, posaconazole, voriconazole -cisapride -dofetilide -dronedarone -pimozide -thioridazine -ziprasidone This medicine may also interact with the following medications: -carbamazepine -certain medicines for depression, anxiety, or psychotic disturbances -fentanyl -linezolid -MAOIs like Carbex, Eldepryl, Marplan, Nardil, and Parnate -methylene blue (injected into a vein) -other medicines that prolong the QT interval  (cause an abnormal heart rhythm) -phenytoin -rifampicin -tramadol This list may not describe all possible interactions. Give your health care provider a list of all the medicines, herbs, non-prescription drugs, or dietary supplements you use. Also tell them if you smoke, drink alcohol, or use illegal drugs. Some items may interact with your medicine. What should I watch for while using this medicine? Your condition will be monitored carefully while  you are receiving this medicine. What side effects may I notice from receiving this medicine? Side effects that you should report to your doctor or health care professional as soon as possible: -allergic reactions like skin rash, itching or hives, swelling of the face, lips, or tongue -breathing problems -confusion -dizziness -fast or irregular heartbeat -feeling faint or lightheaded, falls -fever and chills -loss of balance or coordination -seizures -sweating -swelling of the hands and feet -tightness in the chest -tremors -unusually weak or tired Side effects that usually do not require medical attention (report to your doctor or health care professional if they continue or are bothersome): -constipation or diarrhea -headache This list may not describe all possible side effects. Call your doctor for medical advice about side effects. You may report side effects to FDA at 1-800-FDA-1088. Where should I keep my medicine? This drug is given in a hospital or clinic and will not be stored at home. NOTE: This sheet is a summary. It may not cover all possible information. If you have questions about this medicine, talk to your doctor, pharmacist, or health care provider.  2015, [employer]. (2013-04-06 16:18:28) Dexamethasone injection What is this medicine? DEXAMETHASONE (dex a METH a sone) is a corticosteroid. It is used to treat inflammation of the skin, joints, lungs, and other organs. Common conditions treated include asthma,  allergies, and arthritis. It is also used for other conditions, like blood disorders and diseases of the adrenal glands. This medicine may be used for other purposes; ask your health care provider or pharmacist if you have questions. COMMON BRAND NAME(S): Decadron, Solurex What should I tell my health care provider before I take this medicine? They need to know if you have any of these conditions: -blood clotting problems -Cushing's syndrome -diabetes -glaucoma -heart problems or disease -high blood pressure -infection like herpes, measles, tuberculosis, or chickenpox -kidney disease -liver disease -mental problems -myasthenia gravis -osteoporosis -previous heart attack -seizures -stomach, ulcer or intestine disease including colitis and diverticulitis -thyroid problem -an unusual or allergic reaction to dexamethasone, corticosteroids, other medicines, lactose, foods, dyes, or preservatives -pregnant or trying to get pregnant -breast-feeding How should I use this medicine? This medicine is for injection into a muscle, joint, lesion, soft tissue, or vein. It is given by a health care professional in a hospital or clinic setting. Talk to your pediatrician regarding the use of this medicine in children. Special care may be needed. Overdosage: If you think you have taken too much of this medicine contact a poison control center or emergency room at once. NOTE: This medicine is only for you. Do not share this medicine with others. What if I miss a dose? This may not apply. If you are having a series of injections over a prolonged period, try not to miss an appointment. Call your doctor or health care professional to reschedule if you are unable to keep an appointment. What may interact with this medicine? Do not take this medicine with any of the following medications: -mifepristone, RU-486 -vaccines This medicine may also interact with the following medications: -amphotericin  B -antibiotics like clarithromycin, erythromycin, and troleandomycin -aspirin and aspirin-like drugs -barbiturates like phenobarbital -carbamazepine -cholestyramine -cholinesterase inhibitors like donepezil, galantamine, rivastigmine, and tacrine -cyclosporine -digoxin -diuretics -ephedrine -female hormones, like estrogens or progestins and birth control pills -indinavir -isoniazid -ketoconazole -medicines for diabetes -medicines that improve muscle tone or strength for conditions like myasthenia gravis -NSAIDs, medicines for pain and inflammation, like ibuprofen or naproxen -phenytoin -rifampin -thalidomide -warfarin This list  may not describe all possible interactions. Give your health care provider a list of all the medicines, herbs, non-prescription drugs, or dietary supplements you use. Also tell them if you smoke, drink alcohol, or use illegal drugs. Some items may interact with your medicine. What should I watch for while using this medicine? Your condition will be monitored carefully while you are receiving this medicine. If you are taking this medicine for a long time, carry an identification card with your name and address, the type and dose of your medicine, and your doctor's name and address. This medicine may increase your risk of getting an infection. Stay away from people who are sick. Tell your doctor or health care professional if you are around anyone with measles or chickenpox. Talk to your health care provider before you get any vaccines that you take this medicine. If you are going to have surgery, tell your doctor or health care professional that you have taken this medicine within the last twelve months. Ask your doctor or health care professional about your diet. You may need to lower the amount of salt you eat. The medicine can increase your blood sugar. If you are a diabetic check with your doctor if you need help adjusting the dose of your diabetic  medicine. What side effects may I notice from receiving this medicine? Side effects that you should report to your doctor or health care professional as soon as possible: -allergic reactions like skin rash, itching or hives, swelling of the face, lips, or tongue -black or tarry stools -change in the amount of urine -changes in vision -confusion, excitement, restlessness, a false sense of well-being -fever, sore throat, sneezing, cough, or other signs of infection, wounds that will not heal -hallucinations -increased thirst -mental depression, mood swings, mistaken feelings of self importance or of being mistreated -pain in hips, back, ribs, arms, shoulders, or legs -pain, redness, or irritation at the injection site -redness, blistering, peeling or loosening of the skin, including inside the mouth -rounding out of face -swelling of feet or lower legs -unusual bleeding or bruising -unusual tired or weak -wounds that do not heal Side effects that usually do not require medical attention (report to your doctor or health care professional if they continue or are bothersome): -diarrhea or constipation -change in taste -headache -nausea, vomiting -skin problems, acne, thin and shiny skin -touble sleeping -unusual growth of hair on the face or body -weight gain This list may not describe all possible side effects. Call your doctor for medical advice about side effects. You may report side effects to FDA at 1-800-FDA-1088. Where should I keep my medicine? This drug is given in a hospital or clinic and will not be stored at home. NOTE: This sheet is a summary. It may not cover all possible information. If you have questions about this medicine, talk to your doctor, pharmacist, or health care provider.  2015, [employer] Standard. (2007-10-21 14:04:12) Ondansetron tablets What is this medicine? ONDANSETRON (on DAN se tron) is used to treat nausea and vomiting caused by chemotherapy. It  is also used to prevent or treat nausea and vomiting after surgery. This medicine may be used for other purposes; ask your health care provider or pharmacist if you have questions. COMMON BRAND NAME(S): Zofran What should I tell my health care provider before I take this medicine? They need to know if you have any of these conditions: -heart disease -history of irregular heartbeat -liver disease -low levels of magnesium or potassium in the  blood -an unusual or allergic reaction to ondansetron, granisetron, other medicines, foods, dyes, or preservatives -pregnant or trying to get pregnant -breast-feeding How should I use this medicine? Take this medicine by mouth with a glass of water. Follow the directions on your prescription label. Take your doses at regular intervals. Do not take your medicine more often than directed. Talk to your pediatrician regarding the use of this medicine in children. Special care may be needed. Overdosage: If you think you have taken too much of this medicine contact a poison control center or emergency room at once. NOTE: This medicine is only for you. Do not share this medicine with others. What if I miss a dose? If you miss a dose, take it as soon as you can. If it is almost time for your next dose, take only that dose. Do not take double or extra doses. What may interact with this medicine? Do not take this medicine with any of the following medications: -apomorphine -certain medicines for fungal infections like fluconazole, itraconazole, ketoconazole, posaconazole, voriconazole -cisapride -dofetilide -dronedarone -pimozide -thioridazine -ziprasidone This medicine may also interact with the following medications: -carbamazepine -certain medicines for depression, anxiety, or psychotic disturbances -fentanyl -linezolid -MAOIs like Carbex, Eldepryl, Marplan, Nardil, and Parnate -methylene blue (injected into a vein) -other medicines that prolong the QT  interval (cause an abnormal heart rhythm) -phenytoin -rifampicin -tramadol This list may not describe all possible interactions. Give your health care provider a list of all the medicines, herbs, non-prescription drugs, or dietary supplements you use. Also tell them if you smoke, drink alcohol, or use illegal drugs. Some items may interact with your medicine. What should I watch for while using this medicine? Check with your doctor or health care professional right away if you have any sign of an allergic reaction. What side effects may I notice from receiving this medicine? Side effects that you should report to your doctor or health care professional as soon as possible: -allergic reactions like skin rash, itching or hives, swelling of the face, lips or tongue -breathing problems -confusion -dizziness -fast or irregular heartbeat -feeling faint or lightheaded, falls -fever and chills -loss of balance or coordination -seizures -sweating -swelling of the hands or feet -tightness in the chest -tremors -unusually weak or tired Side effects that usually do not require medical attention (report to your doctor or health care professional if they continue or are bothersome): -constipation or diarrhea -headache This list may not describe all possible side effects. Call your doctor for medical advice about side effects. You may report side effects to FDA at 1-800-FDA-1088. Where should I keep my medicine? Keep out of the reach of children. Store between 2 and 30 degrees C (36 and 86 degrees F). Throw away any unused medicine after the expiration date. NOTE: This sheet is a summary. It may not cover all possible information. If you have questions about this medicine, talk to your doctor, pharmacist, or health care provider.  2015, [employer] Standard. (2013-04-06 16:27:45) Prochlorperazine tablets What is this medicine? PROCHLORPERAZINE (proe klor PER a zeen) helps to control severe  nausea and vomiting. This medicine is also used to treat schizophrenia. It can also help patients who experience anxiety that is not due to psychological illness. This medicine may be used for other purposes; ask your health care provider or pharmacist if you have questions. COMMON BRAND NAME(S): Compazine What should I tell my health care provider before I take this medicine? They need to know if you have any  of these conditions: -blood disorders or disease -dementia -liver disease or jaundice -Parkinson's disease -uncontrollable movement disorder -an unusual or allergic reaction to prochlorperazine, other medicines, foods, dyes, or preservatives -pregnant or trying to get pregnant -breast-feeding How should I use this medicine? Take this medicine by mouth with a glass of water. Follow the directions on the prescription label. Take your doses at regular intervals. Do not take your medicine more often than directed. Do not stop taking this medicine suddenly. This can cause nausea, vomiting, and dizziness. Ask your doctor or health care professional for advice. Talk to your pediatrician regarding the use of this medicine in children. Special care may be needed. While this drug may be prescribed for children as young as 2 years for selected conditions, precautions do apply. Overdosage: If you think you have taken too much of this medicine contact a poison control center or emergency room at once. NOTE: This medicine is only for you. Do not share this medicine with others. What if I miss a dose? If you miss a dose, take it as soon as you can. If it is almost time for your next dose, take only that dose. Do not take double or extra doses. What may interact with this medicine? Do not take this medicine with any of the following medications: -amoxapine -antidepressants like citalopram, escitalopram, fluoxetine, paroxetine, and sertraline -deferoxamine -dofetilide -maprotiline -tricyclic  antidepressants like amitriptyline, clomipramine, imipramine, nortiptyline and others This medicine may also interact with the following medications: -lithium -medicines for pain -phenytoin -propranolol -warfarin This list may not describe all possible interactions. Give your health care provider a list of all the medicines, herbs, non-prescription drugs, or dietary supplements you use. Also tell them if you smoke, drink alcohol, or use illegal drugs. Some items may interact with your medicine. What should I watch for while using this medicine? Visit your doctor or health care professional for regular checks on your progress. You may get drowsy or dizzy. Do not drive, use machinery, or do anything that needs mental alertness until you know how this medicine affects you. Do not stand or sit up quickly, especially if you are an older patient. This reduces the risk of dizzy or fainting spells. Alcohol may interfere with the effect of this medicine. Avoid alcoholic drinks. This medicine can reduce the response of your body to heat or cold. Dress warm in cold weather and stay hydrated in hot weather. If possible, avoid extreme temperatures like saunas, hot tubs, very hot or cold showers, or activities that can cause dehydration such as vigorous exercise. This medicine can make you more sensitive to the sun. Keep out of the sun. If you cannot avoid being in the sun, wear protective clothing and use sunscreen. Do not use sun lamps or tanning beds/booths. Your mouth may get dry. Chewing sugarless gum or sucking hard candy, and drinking plenty of water may help. Contact your doctor if the problem does not go away or is severe. What side effects may I notice from receiving this medicine? Side effects that you should report to your doctor or health care professional as soon as possible: -blurred vision -breast enlargement in men or women -breast milk in women who are not breast-feeding -chest pain, fast or  irregular heartbeat -confusion, restlessness -dark yellow or brown urine -difficulty breathing or swallowing -dizziness or fainting spells -drooling, shaking, movement difficulty (shuffling walk) or rigidity -fever, chills, sore throat -involuntary or uncontrollable movements of the eyes, mouth, head, arms, and legs -seizures -stomach  area pain -unusually weak or tired -unusual bleeding or bruising -yellowing of skin or eyes Side effects that usually do not require medical attention (report to your doctor or health care professional if they continue or are bothersome): -difficulty passing urine -difficulty sleeping -headache -sexual dysfunction -skin rash, or itching This list may not describe all possible side effects. Call your doctor for medical advice about side effects. You may report side effects to FDA at 1-800-FDA-1088. Where should I keep my medicine? Keep out of the reach of children. Store at room temperature between 15 and 30 degrees C (59 and 86 degrees F). Protect from light. Throw away any unused medicine after the expiration date. NOTE: This sheet is a summary. It may not cover all possible information. If you have questions about this medicine, talk to your doctor, pharmacist, or health care provider.  2015, [employer] Standard. (2011-11-18 16:59:39) Lidocaine; Prilocaine cream What is this medicine? LIDOCAINE; PRILOCAINE (LYE doe kane; PRIL oh kane) is a topical anesthetic that causes loss of feeling in the skin and surrounding tissues. It is used to numb the skin before procedures or injections. This medicine may be used for other purposes; ask your health care provider or pharmacist if you have questions. COMMON BRAND NAME(S): EMLA What should I tell my health care provider before I take this medicine? They need to know if you have any of these conditions: -glucose-6-phosphate deficiencies -heart disease -kidney or liver disease -methemoglobinemia -an  unusual or allergic reaction to lidocaine, prilocaine, other medicines, foods, dyes, or preservatives -pregnant or trying to get pregnant -breast-feeding How should I use this medicine? This medicine is for external use only on the skin. Do not take by mouth. Follow the directions on the prescription label. Wash hands before and after use. Do not use more or leave in contact with the skin longer than directed. Do not apply to eyes or open wounds. It can cause irritation and blurred or temporary loss of vision. If this medicine comes in contact with your eyes, immediately rinse the eye with water. Do not touch or rub the eye. Contact your health care provider right away. Talk to your pediatrician regarding the use of this medicine in children. While this medicine may be prescribed for children for selected conditions, precautions do apply. Overdosage: If you think you have taken too much of this medicine contact a poison control center or emergency room at once. NOTE: This medicine is only for you. Do not share this medicine with others. What if I miss a dose? This medicine is usually only applied once prior to each procedure. It must be in contact with the skin for a period of time for it to work. If you applied this medicine later than directed, tell your health care professional before starting the procedure. What may interact with this medicine? -acetaminophen -chloroquine -dapsone -medicines to control heart rhythm -nitrates like nitroglycerin and nitroprusside -other ointments, creams, or sprays that may contain anesthetic medicine -phenobarbital -phenytoin -quinine -sulfonamides like sulfacetamide, sulfamethoxazole, sulfasalazine and others This list may not describe all possible interactions. Give your health care provider a list of all the medicines, herbs, non-prescription drugs, or dietary supplements you use. Also tell them if you smoke, drink alcohol, or use illegal drugs. Some items  may interact with your medicine. What should I watch for while using this medicine? Be careful to avoid injury to the treated area while it is numb and you are not aware of pain. Avoid scratching, rubbing, or exposing  the treated area to hot or cold temperatures until complete sensation has returned. The numb feeling will wear off a few hours after applying the cream. What side effects may I notice from receiving this medicine? Side effects that you should report to your doctor or health care professional as soon as possible: -blurred vision -chest pain -difficulty breathing -dizziness -drowsiness -fast or irregular heartbeat -skin rash or itching -swelling of your throat, lips, or face -trembling Side effects that usually do not require medical attention (report to your doctor or health care professional if they continue or are bothersome): -changes in ability to feel hot or cold -redness and swelling at the application site This list may not describe all possible side effects. Call your doctor for medical advice about side effects. You may report side effects to FDA at 1-800-FDA-1088. Where should I keep my medicine? Keep out of reach of children. Store at room temperature between 15 and 30 degrees C (59 and 86 degrees F). Keep container tightly closed. Throw away any unused medicine after the expiration date. NOTE: This sheet is a summary. It may not cover all possible information. If you have questions about this medicine, talk to your doctor, pharmacist, or health care provider.  2015, [employer] Standard. (2008-01-03 17:14:35)

## 2014-10-10 ENCOUNTER — Telehealth (HOSPITAL_COMMUNITY): Payer: Self-pay | Admitting: Hematology & Oncology

## 2014-10-10 NOTE — Telephone Encounter (Signed)
PER [employer] AUTH IS NOT REQUIRED*[facility]

## 2014-10-11 ENCOUNTER — Encounter (HOSPITAL_COMMUNITY): Payer: Self-pay

## 2014-10-11 ENCOUNTER — Encounter (HOSPITAL_BASED_OUTPATIENT_CLINIC_OR_DEPARTMENT_OTHER)

## 2014-10-11 DIAGNOSIS — Z5112 Encounter for antineoplastic immunotherapy: Secondary | ICD-10-CM

## 2014-10-11 DIAGNOSIS — C7800 Secondary malignant neoplasm of unspecified lung: Principal | ICD-10-CM

## 2014-10-11 DIAGNOSIS — C189 Malignant neoplasm of colon, unspecified: Secondary | ICD-10-CM | POA: Diagnosis not present

## 2014-10-11 DIAGNOSIS — C787 Secondary malignant neoplasm of liver and intrahepatic bile duct: Secondary | ICD-10-CM

## 2014-10-11 DIAGNOSIS — C7801 Secondary malignant neoplasm of right lung: Secondary | ICD-10-CM

## 2014-10-11 LAB — URINALYSIS, DIPSTICK ONLY
Bilirubin Urine: NEGATIVE
Glucose, UA: NEGATIVE mg/dL
KETONES UR: NEGATIVE mg/dL
Leukocytes, UA: NEGATIVE
NITRITE: NEGATIVE
PH: 6 (ref 5.0–8.0)
PROTEIN: NEGATIVE mg/dL
Specific Gravity, Urine: 1.02 (ref 1.005–1.030)
Urobilinogen, UA: 0.2 mg/dL (ref 0.0–1.0)

## 2014-10-11 LAB — CBC WITH DIFFERENTIAL/PLATELET
BASOS ABS: 0 10*3/uL (ref 0.0–0.1)
Basophils Relative: 0 % (ref 0–1)
EOS ABS: 0.2 10*3/uL (ref 0.0–0.7)
Eosinophils Relative: 2 % (ref 0–5)
HCT: 38.6 % (ref 36.0–46.0)
Hemoglobin: 12.7 g/dL (ref 12.0–15.0)
Lymphocytes Relative: 32 % (ref 12–46)
Lymphs Abs: 3.2 10*3/uL (ref 0.7–4.0)
MCH: 30.5 pg (ref 26.0–34.0)
MCHC: 32.9 g/dL (ref 30.0–36.0)
MCV: 92.8 fL (ref 78.0–100.0)
Monocytes Absolute: 0.8 10*3/uL (ref 0.1–1.0)
Monocytes Relative: 8 % (ref 3–12)
NEUTROS ABS: 5.7 10*3/uL (ref 1.7–7.7)
Neutrophils Relative %: 58 % (ref 43–77)
Platelets: 175 10*3/uL (ref 150–400)
RBC: 4.16 MIL/uL (ref 3.87–5.11)
RDW: 14.2 % (ref 11.5–15.5)
WBC: 9.8 10*3/uL (ref 4.0–10.5)

## 2014-10-11 LAB — COMPREHENSIVE METABOLIC PANEL
ALBUMIN: 3.6 g/dL (ref 3.5–5.2)
ALT: 14 U/L (ref 0–35)
ANION GAP: 9 (ref 5–15)
AST: 17 U/L (ref 0–37)
Alkaline Phosphatase: 72 U/L (ref 39–117)
BUN: 11 mg/dL (ref 6–23)
CHLORIDE: 105 mmol/L (ref 96–112)
CO2: 26 mmol/L (ref 19–32)
Calcium: 9.3 mg/dL (ref 8.4–10.5)
Creatinine, Ser: 0.68 mg/dL (ref 0.50–1.10)
GFR calc Af Amer: >90 mL/min (ref >90)
Glucose, Bld: 128 mg/dL — ABNORMAL HIGH (ref 70–99)
Potassium: 3.8 mmol/L (ref 3.5–5.1)
SODIUM: 140 mmol/L (ref 135–145)
TOTAL PROTEIN: 7.9 g/dL (ref 6.0–8.3)
Total Bilirubin: 0.3 mg/dL (ref 0.3–1.2)

## 2014-10-11 MED ORDER — SODIUM CHLORIDE 0.9 % IJ SOLN: 10.0000 mL | INTRAMUSCULAR | Status: DC | PRN

## 2014-10-11 MED ORDER — SODIUM CHLORIDE 0.9 % IV SOLN
2400.0000 mg/m2 | INTRAVENOUS | Status: DC
  Administered 2014-10-11: 5400 mg via INTRAVENOUS
  Filled 2014-10-11: qty 98

## 2014-10-11 MED ORDER — DEXTROSE 5 % IV SOLN
Freq: Once | INTRAVENOUS | Status: AC
  Administered 2014-10-11: 09:00:00 via INTRAVENOUS

## 2014-10-11 MED ORDER — FOSAPREPITANT DIMEGLUMINE INJECTION 150 MG
Freq: Once | INTRAVENOUS | Status: AC
  Administered 2014-10-11: 11:00:00 via INTRAVENOUS
  Filled 2014-10-11: qty 5

## 2014-10-11 MED ORDER — SODIUM CHLORIDE 0.9 % IV SOLN: 12.0000 mg | Freq: Once | INTRAVENOUS | Status: DC

## 2014-10-11 MED ORDER — PALONOSETRON HCL INJECTION 0.25 MG/5ML
0.2500 mg | Freq: Once | INTRAVENOUS | Status: AC
  Administered 2014-10-11: 0.25 mg via INTRAVENOUS
  Filled 2014-10-11: qty 5

## 2014-10-11 MED ORDER — LEUCOVORIN CALCIUM INJECTION 350 MG
400.0000 mg/m2 | Freq: Once | INTRAVENOUS | Status: AC
  Administered 2014-10-11: 896 mg via INTRAVENOUS
  Filled 2014-10-11: qty 44.8

## 2014-10-11 MED ORDER — SODIUM CHLORIDE 0.9 % IV SOLN
5.0000 mg/kg | Freq: Once | INTRAVENOUS | Status: AC
  Administered 2014-10-11: 550 mg via INTRAVENOUS
  Filled 2014-10-11: qty 16

## 2014-10-11 MED ORDER — OXALIPLATIN CHEMO INJECTION 100 MG/20ML
68.0000 mg/m2 | Freq: Once | INTRAVENOUS | Status: AC
  Administered 2014-10-11: 150 mg via INTRAVENOUS
  Filled 2014-10-11: qty 30

## 2014-10-11 MED ORDER — LORAZEPAM 2 MG/ML IJ SOLN
1.0000 mg | Freq: Once | INTRAMUSCULAR | Status: AC
Start: 2014-10-11 — End: 2014-10-11
  Administered 2014-10-11: 1 mg via INTRAVENOUS
  Filled 2014-10-11: qty 1

## 2014-10-11 NOTE — Progress Notes (Signed)
Chemo teaching done and consent signed for Oxaliplatin, 5FU, Leucovorin, Avastin. Calendar given to patient's daughter. [employer] paperwork signed and faxed to [employer].

## 2014-10-11 NOTE — Progress Notes (Signed)
Tolerated tx w/o adverse reaction; a&ox4; in no distress.  Discharged via wheelchair and left in c/o daughter for transport home.

## 2014-10-12 ENCOUNTER — Encounter (HOSPITAL_COMMUNITY): Payer: Self-pay | Admitting: *Deleted

## 2014-10-12 NOTE — Progress Notes (Signed)
24h follow up: Patient is nauseated today. I told her to take her zofran every 8 hours and wake up during the night and take it if she needs to. She said her phenergan suppositories worked good for her. So I told her to alternate her Phenergan with her Zofran. She said ok. She is going to bring her nausea meds with her tomorrow so that we can see exactly what meds she has. She said ok.

## 2014-10-13 ENCOUNTER — Encounter (HOSPITAL_COMMUNITY): Payer: Self-pay

## 2014-10-13 ENCOUNTER — Encounter (HOSPITAL_COMMUNITY): Attending: Hematology & Oncology

## 2014-10-13 DIAGNOSIS — C7801 Secondary malignant neoplasm of right lung: Secondary | ICD-10-CM

## 2014-10-13 DIAGNOSIS — C787 Secondary malignant neoplasm of liver and intrahepatic bile duct: Secondary | ICD-10-CM | POA: Diagnosis not present

## 2014-10-13 DIAGNOSIS — C189 Malignant neoplasm of colon, unspecified: Secondary | ICD-10-CM | POA: Insufficient documentation

## 2014-10-13 DIAGNOSIS — Z452 Encounter for adjustment and management of vascular access device: Secondary | ICD-10-CM

## 2014-10-13 DIAGNOSIS — C180 Malignant neoplasm of cecum: Secondary | ICD-10-CM | POA: Diagnosis not present

## 2014-10-13 DIAGNOSIS — C7800 Secondary malignant neoplasm of unspecified lung: Secondary | ICD-10-CM | POA: Diagnosis not present

## 2014-10-13 MED ORDER — SODIUM CHLORIDE 0.9 % IJ SOLN
10.0000 mL | INTRAMUSCULAR | Status: DC | PRN
  Administered 2014-10-13: 10 mL
  Filled 2014-10-13: qty 10

## 2014-10-13 MED ORDER — HEPARIN SOD (PORK) LOCK FLUSH 100 UNIT/ML IV SOLN
500.0000 [IU] | Freq: Once | INTRAVENOUS | Status: AC | PRN
  Administered 2014-10-13: 500 [IU]

## 2014-10-13 MED ORDER — HEPARIN SOD (PORK) LOCK FLUSH 100 UNIT/ML IV SOLN
INTRAVENOUS | Status: AC
  Filled 2014-10-13: qty 5

## 2014-10-13 MED ORDER — PROMETHAZINE HCL 25 MG RE SUPP: 25.0000 mg | Freq: Four times a day (QID) | RECTAL | Status: DC | PRN

## 2014-10-13 NOTE — Patient Instructions (Signed)
[place] at [facility] Discharge Instructions  RECOMMENDATIONS MADE BY THE CONSULTANT AND ANY TEST RESULTS WILL BE SENT TO YOUR REFERRING PHYSICIAN.  Return as scheduled for office visit next week. Call the clinic with any questions or concerns.  Thank you for choosing [place] at [facility] to provide your oncology and hematology care.  To afford each patient quality time with our provider, please arrive at least 15 minutes before your scheduled appointment time.    You need to re-schedule your appointment should you arrive 10 or more minutes late.  We strive to give you quality time with our providers, and arriving late affects you and other patients whose appointments are after yours.  Also, if you no show three or more times for appointments you may be dismissed from the clinic at the providers discretion.     Again, thank you for choosing [facility].  Our hope is that these requests will decrease the amount of time that you wait before being seen by our physicians.       _____________________________________________________________  Should you have questions after your visit to [facility], please contact our office at [phone] between the hours of 8:30 a.m. and 4:30 p.m.  Voicemails left after 4:30 p.m. will not be returned until the following business day.  For prescription refill requests, have your pharmacy contact our office.

## 2014-10-14 LAB — CEA: CEA: 135.6 ng/mL — AB (ref 0.0–4.7)

## 2014-10-17 ENCOUNTER — Encounter: Payer: Self-pay | Admitting: *Deleted

## 2014-10-17 NOTE — Progress Notes (Signed)
[place] Psychosocial Distress Screening Clinical Social Work  Clinical Social Work was referred by distress screening protocol.  The patient scored a 7 on the Psychosocial Distress Thermometer which indicates severe distress. Clinical Social Worker phoned pt to assess for distress and other psychosocial needs. She recently switched to [facility], but has been dealing with her cancer since last summer. She reports to have good support from family and her daughter can bring her to appts. CSW reviewed role of CSW and resources to assist pt. Pt is very interested in attending Support Group next week and CSW has registered her for that. CSW and pt to meet and review additional resources after group.   ONCBCN DISTRESS SCREENING 10/11/2014  Screening Type Initial Screening  Distress experienced in past week (1-10) 7  Information Concerns Type Lack of info about diagnosis;Lack of info about treatment;Lack of info about complementary therapy choices    Clinical Social Worker follow up needed: Yes.    If yes, follow up plan: See above Loren Racer, [place] Tuesdays 8:30-1pm Wednesdays 8:30-12pm  [phone]'

## 2014-10-20 ENCOUNTER — Encounter (HOSPITAL_COMMUNITY): Payer: Self-pay | Admitting: Hematology & Oncology

## 2014-10-20 ENCOUNTER — Encounter (HOSPITAL_BASED_OUTPATIENT_CLINIC_OR_DEPARTMENT_OTHER): Admitting: Hematology & Oncology

## 2014-10-20 VITALS — BP 138/95 | HR 84 | Temp 97.7°F | Resp 20 | Wt 244.0 lb

## 2014-10-20 DIAGNOSIS — Z86718 Personal history of other venous thrombosis and embolism: Secondary | ICD-10-CM

## 2014-10-20 DIAGNOSIS — C180 Malignant neoplasm of cecum: Secondary | ICD-10-CM | POA: Diagnosis not present

## 2014-10-20 DIAGNOSIS — R110 Nausea: Secondary | ICD-10-CM

## 2014-10-20 DIAGNOSIS — C787 Secondary malignant neoplasm of liver and intrahepatic bile duct: Secondary | ICD-10-CM

## 2014-10-20 DIAGNOSIS — C189 Malignant neoplasm of colon, unspecified: Secondary | ICD-10-CM

## 2014-10-20 DIAGNOSIS — C7801 Secondary malignant neoplasm of right lung: Secondary | ICD-10-CM

## 2014-10-20 DIAGNOSIS — C7800 Secondary malignant neoplasm of unspecified lung: Principal | ICD-10-CM

## 2014-10-20 MED ORDER — LISINOPRIL 10 MG PO TABS: 10.0000 mg | ORAL_TABLET | Freq: Every day | ORAL | Status: DC

## 2014-10-20 NOTE — Patient Instructions (Signed)
[place] at [facility]  Discharge Instructions:  Your exam was completed by Dr Whitney Muse today. Return to see the doctor on 11/06/2014 and to have chemotherapy. Have fun on your cruise :) Please call the clinic if you have any questions or concerns  _______________________________________________________________  Thank you for choosing [place] at [facility] to provide your oncology and hematology care.  To afford each patient quality time with our providers, please arrive at least 15 minutes before your scheduled appointment.  You need to re-schedule your appointment if you arrive 10 or more minutes late.  We strive to give you quality time with our providers, and arriving late affects you and other patients whose appointments are after yours.  Also, if you no show three or more times for appointments you may be dismissed from the clinic.  Again, thank you for choosing [place] at [place] hope is that these requests will allow you access to exceptional care and in a timely manner. _______________________________________________________________  If you have questions after your visit, please contact our office at [phone] between the hours of 8:30 a.m. and 5:00 p.m. Voicemails left after 4:30 p.m. will not be returned until the following business day. _______________________________________________________________  For prescription refill requests, have your pharmacy contact our office. _______________________________________________________________  Recommendations made by the consultant and any test results will be sent to your referring physician. _______________________________________________________________

## 2014-10-24 ENCOUNTER — Inpatient Hospital Stay (HOSPITAL_COMMUNITY)

## 2014-10-24 ENCOUNTER — Ambulatory Visit (HOSPITAL_COMMUNITY): Admitting: Hematology & Oncology

## 2014-10-25 ENCOUNTER — Inpatient Hospital Stay (HOSPITAL_COMMUNITY)

## 2014-10-25 ENCOUNTER — Ambulatory Visit (HOSPITAL_COMMUNITY): Admitting: Oncology

## 2014-10-26 ENCOUNTER — Encounter (HOSPITAL_COMMUNITY)

## 2014-10-27 ENCOUNTER — Encounter (HOSPITAL_COMMUNITY)

## 2014-11-06 ENCOUNTER — Encounter (HOSPITAL_BASED_OUTPATIENT_CLINIC_OR_DEPARTMENT_OTHER): Admitting: Hematology & Oncology

## 2014-11-06 ENCOUNTER — Encounter (HOSPITAL_BASED_OUTPATIENT_CLINIC_OR_DEPARTMENT_OTHER)

## 2014-11-06 ENCOUNTER — Encounter (HOSPITAL_COMMUNITY): Payer: Self-pay | Admitting: Hematology & Oncology

## 2014-11-06 VITALS — BP 132/81 | HR 69 | Resp 20

## 2014-11-06 VITALS — BP 119/78 | HR 78 | Temp 97.5°F | Resp 20 | Wt 249.2 lb

## 2014-11-06 DIAGNOSIS — C187 Malignant neoplasm of sigmoid colon: Secondary | ICD-10-CM | POA: Diagnosis not present

## 2014-11-06 DIAGNOSIS — C7800 Secondary malignant neoplasm of unspecified lung: Secondary | ICD-10-CM

## 2014-11-06 DIAGNOSIS — I82601 Acute embolism and thrombosis of unspecified veins of right upper extremity: Secondary | ICD-10-CM

## 2014-11-06 DIAGNOSIS — Z5111 Encounter for antineoplastic chemotherapy: Secondary | ICD-10-CM

## 2014-11-06 DIAGNOSIS — C189 Malignant neoplasm of colon, unspecified: Secondary | ICD-10-CM

## 2014-11-06 DIAGNOSIS — C787 Secondary malignant neoplasm of liver and intrahepatic bile duct: Secondary | ICD-10-CM

## 2014-11-06 DIAGNOSIS — Z5112 Encounter for antineoplastic immunotherapy: Secondary | ICD-10-CM | POA: Diagnosis not present

## 2014-11-06 LAB — CBC WITH DIFFERENTIAL/PLATELET
Basophils Absolute: 0 10*3/uL (ref 0.0–0.1)
Basophils Relative: 0 % (ref 0–1)
Eosinophils Absolute: 0.2 10*3/uL (ref 0.0–0.7)
Eosinophils Relative: 2 % (ref 0–5)
HEMATOCRIT: 37.9 % (ref 36.0–46.0)
HEMOGLOBIN: 12.3 g/dL (ref 12.0–15.0)
LYMPHS ABS: 3.3 10*3/uL (ref 0.7–4.0)
Lymphocytes Relative: 36 % (ref 12–46)
MCH: 30.5 pg (ref 26.0–34.0)
MCHC: 32.5 g/dL (ref 30.0–36.0)
MCV: 94 fL (ref 78.0–100.0)
MONO ABS: 0.9 10*3/uL (ref 0.1–1.0)
Monocytes Relative: 10 % (ref 3–12)
Neutro Abs: 4.6 10*3/uL (ref 1.7–7.7)
Neutrophils Relative %: 52 % (ref 43–77)
PLATELETS: 206 10*3/uL (ref 150–400)
RBC: 4.03 MIL/uL (ref 3.87–5.11)
RDW: 14.8 % (ref 11.5–15.5)
WBC: 9 10*3/uL (ref 4.0–10.5)

## 2014-11-06 LAB — COMPREHENSIVE METABOLIC PANEL
ALK PHOS: 70 U/L (ref 39–117)
ALT: 20 U/L (ref 0–35)
AST: 24 U/L (ref 0–37)
Albumin: 3.4 g/dL — ABNORMAL LOW (ref 3.5–5.2)
Anion gap: 6 (ref 5–15)
BUN: 11 mg/dL (ref 6–23)
CALCIUM: 9 mg/dL (ref 8.4–10.5)
CO2: 25 mmol/L (ref 19–32)
CREATININE: 0.68 mg/dL (ref 0.50–1.10)
Chloride: 106 mmol/L (ref 96–112)
Glucose, Bld: 150 mg/dL — ABNORMAL HIGH (ref 70–99)
Potassium: 4 mmol/L (ref 3.5–5.1)
Sodium: 137 mmol/L (ref 135–145)
Total Bilirubin: 0.2 mg/dL — ABNORMAL LOW (ref 0.3–1.2)
Total Protein: 7.3 g/dL (ref 6.0–8.3)

## 2014-11-06 LAB — URINALYSIS, DIPSTICK ONLY
BILIRUBIN URINE: NEGATIVE
GLUCOSE, UA: 100 mg/dL — AB
Ketones, ur: NEGATIVE mg/dL
Leukocytes, UA: NEGATIVE
Nitrite: NEGATIVE
Protein, ur: NEGATIVE mg/dL
Specific Gravity, Urine: >1.03 — ABNORMAL HIGH (ref 1.005–1.030)
Urobilinogen, UA: 0.2 mg/dL (ref 0.0–1.0)
pH: 6 (ref 5.0–8.0)

## 2014-11-06 MED ORDER — HEPARIN SOD (PORK) LOCK FLUSH 100 UNIT/ML IV SOLN: 500.0000 [IU] | Freq: Once | INTRAVENOUS | Status: DC | PRN

## 2014-11-06 MED ORDER — OXALIPLATIN CHEMO INJECTION 100 MG/20ML
68.0000 mg/m2 | Freq: Once | INTRAVENOUS | Status: AC
  Administered 2014-11-06: 150 mg via INTRAVENOUS
  Filled 2014-11-06: qty 30

## 2014-11-06 MED ORDER — SODIUM CHLORIDE 0.9 % IV SOLN
2400.0000 mg/m2 | INTRAVENOUS | Status: DC
  Administered 2014-11-06: 5400 mg via INTRAVENOUS
  Filled 2014-11-06: qty 108

## 2014-11-06 MED ORDER — SODIUM CHLORIDE 0.9 % IJ SOLN
10.0000 mL | INTRAMUSCULAR | Status: DC | PRN
  Administered 2014-11-06: 10 mL
  Filled 2014-11-06: qty 10

## 2014-11-06 MED ORDER — DEXTROSE 5 % IV SOLN
Freq: Once | INTRAVENOUS | Status: AC
  Administered 2014-11-06: 10:00:00 via INTRAVENOUS

## 2014-11-06 MED ORDER — LORAZEPAM 2 MG/ML IJ SOLN
1.0000 mg | Freq: Once | INTRAMUSCULAR | Status: AC
  Administered 2014-11-06: 1 mg via INTRAVENOUS
  Filled 2014-11-06: qty 1

## 2014-11-06 MED ORDER — SODIUM CHLORIDE 0.9 % IV SOLN: 12.0000 mg | Freq: Once | INTRAVENOUS | Status: DC

## 2014-11-06 MED ORDER — SODIUM CHLORIDE 0.9 % IV SOLN: Freq: Once | INTRAVENOUS | Status: DC

## 2014-11-06 MED ORDER — PALONOSETRON HCL INJECTION 0.25 MG/5ML
0.2500 mg | Freq: Once | INTRAVENOUS | Status: AC
  Administered 2014-11-06: 0.25 mg via INTRAVENOUS
  Filled 2014-11-06: qty 5

## 2014-11-06 MED ORDER — LEUCOVORIN CALCIUM INJECTION 350 MG
400.0000 mg/m2 | Freq: Once | INTRAVENOUS | Status: AC
  Administered 2014-11-06: 896 mg via INTRAVENOUS
  Filled 2014-11-06: qty 44.8

## 2014-11-06 MED ORDER — SODIUM CHLORIDE 0.9 % IV SOLN
Freq: Once | INTRAVENOUS | Status: AC
  Administered 2014-11-06: 10:00:00 via INTRAVENOUS
  Filled 2014-11-06: qty 5

## 2014-11-06 MED ORDER — SODIUM CHLORIDE 0.9 % IV SOLN
5.0000 mg/kg | Freq: Once | INTRAVENOUS | Status: AC
  Administered 2014-11-06: 550 mg via INTRAVENOUS
  Filled 2014-11-06: qty 16

## 2014-11-06 NOTE — Patient Instructions (Signed)
..  [facility] Discharge Instructions for Patients Receiving Chemotherapy  Today you received the following chemotherapy agents folfox plus avastin Return on Wednesday to have your pump removed Return next week for a follow up again  Please call the clinic if you have any questions or concerns  To help prevent nausea and vomiting after your treatment, we encourage you to take your nausea medication   If you develop nausea and vomiting, or diarrhea that is not controlled by your medication, call the clinic.  The clinic phone number is [phone]. Office hours are Monday-Friday 8:30am-5:00pm.  BELOW ARE SYMPTOMS THAT SHOULD BE REPORTED IMMEDIATELY:  *FEVER GREATER THAN 101.0 F  *CHILLS WITH OR WITHOUT FEVER  NAUSEA AND VOMITING THAT IS NOT CONTROLLED WITH YOUR NAUSEA MEDICATION  *UNUSUAL SHORTNESS OF BREATH  *UNUSUAL BRUISING OR BLEEDING  TENDERNESS IN MOUTH AND THROAT WITH OR WITHOUT PRESENCE OF ULCERS  *URINARY PROBLEMS  *BOWEL PROBLEMS  UNUSUAL RASH Items with * indicate a potential emergency and should be followed up as soon as possible. If you have an emergency after office hours please contact your primary care physician or go to the nearest emergency department.  Please call the clinic during office hours if you have any questions or concerns.   You may also contact the Patient Navigator at [phone] should you have any questions or need assistance in obtaining follow up care. _____________________________________________________________________ Have you asked about our STAR program?    STAR stands for Survivorship Training and Rehabilitation, and this is a nationally recognized cancer care program that focuses on survivorship and rehabilitation.  Cancer and cancer treatments may cause problems, such as, pain, making you feel tired and keeping you from doing the things that you need or want to do. Cancer rehabilitation can help. Our goal is to  reduce these troubling effects and help you have the best quality of life possible.  You may receive a survey from a nurse that asks questions about your current state of health.  Based on the survey results, all eligible patients will be referred to the [facility] program for an evaluation so we can better serve you! A frequently asked questions sheet is available upon request.

## 2014-11-06 NOTE — Progress Notes (Signed)
Erica Barker Tolerated chemotherapy well.  Discharged via wheelchair.

## 2014-11-07 ENCOUNTER — Inpatient Hospital Stay (HOSPITAL_COMMUNITY)

## 2014-11-07 ENCOUNTER — Ambulatory Visit (HOSPITAL_COMMUNITY): Admitting: Hematology & Oncology

## 2014-11-08 ENCOUNTER — Encounter (HOSPITAL_BASED_OUTPATIENT_CLINIC_OR_DEPARTMENT_OTHER)

## 2014-11-08 ENCOUNTER — Encounter (HOSPITAL_COMMUNITY): Payer: Self-pay

## 2014-11-08 ENCOUNTER — Ambulatory Visit (HOSPITAL_COMMUNITY): Admitting: Hematology & Oncology

## 2014-11-08 ENCOUNTER — Inpatient Hospital Stay (HOSPITAL_COMMUNITY)

## 2014-11-08 VITALS — BP 134/77 | HR 92 | Temp 98.3°F | Resp 20

## 2014-11-08 DIAGNOSIS — C7800 Secondary malignant neoplasm of unspecified lung: Secondary | ICD-10-CM | POA: Diagnosis not present

## 2014-11-08 DIAGNOSIS — C189 Malignant neoplasm of colon, unspecified: Secondary | ICD-10-CM | POA: Diagnosis not present

## 2014-11-08 MED ORDER — PALONOSETRON HCL INJECTION 0.25 MG/5ML
0.2500 mg | Freq: Once | INTRAVENOUS | Status: AC
Start: 2014-11-08 — End: 2014-11-08
  Administered 2014-11-08: 0.25 mg via INTRAVENOUS

## 2014-11-08 MED ORDER — HEPARIN SOD (PORK) LOCK FLUSH 100 UNIT/ML IV SOLN
500.0000 [IU] | Freq: Once | INTRAVENOUS | Status: AC | PRN
  Administered 2014-11-08: 500 [IU]

## 2014-11-08 MED ORDER — SODIUM CHLORIDE 0.9 % IJ SOLN
10.0000 mL | INTRAMUSCULAR | Status: DC | PRN
  Administered 2014-11-08: 10 mL
  Filled 2014-11-08: qty 10

## 2014-11-08 MED ORDER — PALONOSETRON HCL INJECTION 0.25 MG/5ML
INTRAVENOUS | Status: AC
  Filled 2014-11-08: qty 5

## 2014-11-08 NOTE — Progress Notes (Signed)
Erica Barker presented for Portacath de-access and flush.  Proper placement of portacath confirmed by CXR.  Portacath located right chest wall accessed with  H 20 needle.  Good blood return present. Portacath flushed with 52m NS and 500U/5104mHeparin and needle removed intact.  Procedure tolerated well and without incident.  Pump removed

## 2014-11-08 NOTE — Patient Instructions (Signed)
[place] at [facility]  Discharge Instructions:  Port de-assessed, pump removed, nausea medication given.  Please return as scheduled. Call the clinic if you have any questions or concerns _______________________________________________________________  Thank you for choosing [place] at [facility] to provide your oncology and hematology care.  To afford each patient quality time with our providers, please arrive at least 15 minutes before your scheduled appointment.  You need to re-schedule your appointment if you arrive 10 or more minutes late.  We strive to give you quality time with our providers, and arriving late affects you and other patients whose appointments are after yours.  Also, if you no show three or more times for appointments you may be dismissed from the clinic.  Again, thank you for choosing [place] at [place] hope is that these requests will allow you access to exceptional care and in a timely manner. _______________________________________________________________  If you have questions after your visit, please contact our office at [phone] between the hours of 8:30 a.m. and 5:00 p.m. Voicemails left after 4:30 p.m. will not be returned until the following business day. _______________________________________________________________  For prescription refill requests, have your pharmacy contact our office. _______________________________________________________________  Recommendations made by the consultant and any test results will be sent to your referring physician. _______________________________________________________________

## 2014-11-09 ENCOUNTER — Encounter (HOSPITAL_COMMUNITY)

## 2014-11-10 ENCOUNTER — Encounter (HOSPITAL_COMMUNITY)

## 2014-11-14 NOTE — Progress Notes (Signed)
[place] NOTE  Patient Care Team: [name], [place] as PCP - General Melrose Nakayama, MD as Consulting Physician (Cardiothoracic Surgery)  CHIEF COMPLAINTS/PURPOSE OF CONSULTATION:  Stage IV CRC CT of abdomen and pelvis on 02/20/2014 at [facility] showing 3.6 cm apple core lesion in the proximal sigmoid colon 11 mm hypoenhancing lesion in the medial left hepatic dome, 4.1 cm right lower lobe mass and adjacent right lower lobe nodule CEA on 08/18/2014 of 93.7 ng/ml Right upper extremity DVT 04/17/2014 paired brachial veins, axillary vein, and peripheral aspect of the right subclavian vein CT chest 07/06/2014 with bilateral pulmonary nodules and masses, RUL, lateral RUL, posterior LUL, lobulated nodule in the subpleural RLL,    HISTORY OF PRESENTING ILLNESS:   Erica Barker 54 y.o. female is here because of stage IV colon cancer. We dose reduced her oxaliplatin secondary to severe nausea and vomiting. She also had severe fatigue affecting her ADLs for greater than one week after each cycle. She states that she has done fairly well with this treatment. She has had no vomiting. No severe nausea. She noted her appetite had returned by Wednesday, less than a week after her treatment. Her energy was back to baseline quicker. He wishes to proceed with ongoing therapy but needs to reschedule her next treatment as she is going on a cruise with her mother.   MEDICAL HISTORY:  Past Medical History  Diagnosis Date  . Hypertension   . Depression   . Pulmonary nodules 01/30/14    CTA CHEST  . Lymphadenopathy, mediastinal 01/2014    [place]  . Morbid obesity   . Diabetes mellitus, type II   . Headache(784.0)     SURGICAL HISTORY: Past Surgical History  Procedure Laterality Date  . Cholecystectomy    . Video bronchoscopy with endobronchial ultrasound N/A 02/06/2014    Procedure: VIDEO BRONCHOSCOPY WITH ENDOBRONCHIAL  ULTRASOUND,bronchial biopsies , node sampling;  Surgeon: Melrose Nakayama, MD;  Location: [facility];  Service: Thoracic;  Laterality: N/A;  . Tubal ligation      SOCIAL HISTORY: History   Social History  . Marital Status: Married    Spouse Name: N/A  . Number of Children: N/A  . Years of Education: N/A   Occupational History  . Not on file.   Social History Main Topics  . Smoking status: Former Smoker -- 0.50 packs/day for 7 years    Types: Cigarettes    Quit date: 02/03/1989  . Smokeless tobacco: Not on file  . Alcohol Use: Yes     Comment: occasional  . Drug Use: No  . Sexual Activity: Not on file   Other Topics Concern  . Not on file   Social History Narrative  She is in a relationship. 3 children. Aged 32,35,20.  She has no grandchildren. She smoked in high school but quit after graduation. No ETOH. 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  She is originally from [place].  FAMILY HISTORY: Family History  Problem Relation Age of Onset  . Cancer Mother     UTERINE  . Cancer Father     LEUKEMIA  . Crohn's disease Son    indicated that her mother is alive. She indicated that her father is deceased. She indicated that her son is alive.   Father is deceased at age 16 from leukemia, mother is alive at 71 with a history of uterine cancer.  She has one sister how is healthy and one brother who is healthy. He works as a Engineer, structural.   ALLERGIES:  is allergic to asa.  MEDICATIONS:  Current Outpatient Prescriptions  Medication Sig Dispense Refill  . aspirin-acetaminophen-caffeine (EXCEDRIN MIGRAINE) 250-250-65 MG per tablet Take 2 tablets by mouth every 6 (six) hours as needed for headache.    . fluorouracil CALGB 58527 in sodium chloride 0.9 % 150 mL Inject into the vein. To infuse over 46 hours every 14 days    . LEUCOVORIN CALCIUM IV Inject into the vein every 14 (fourteen) days.    Marland Kitchen lidocaine-prilocaine (EMLA) cream Apply a  quarter size amount to port site 1 hour prior to chemo. Do not rub in. Cover with plastic wrap. 30 g 3  . lisinopril (PRINIVIL,ZESTRIL) 10 MG tablet Take 1 tablet (10 mg total) by mouth daily. 30 tablet 6  . metFORMIN (GLUCOPHAGE) 500 MG tablet Take 500 mg by mouth daily with breakfast.     . Multiple Vitamin (MULTIVITAMIN) capsule Take 1 capsule by mouth daily.    . ondansetron (ZOFRAN) 8 MG tablet Take 1 tablet (8 mg total) by mouth every 8 (eight) hours as needed for nausea or vomiting. 30 tablet 2  . OXALIPLATIN IV Inject into the vein every 14 (fourteen) days.    Marland Kitchen oxyCODONE-acetaminophen (PERCOCET/ROXICET) 5-325 MG per tablet Take 1 tablet by mouth every 4 (four) hours as needed for severe pain.     . pantoprazole (PROTONIX) 40 MG tablet Take 1 tablet (40 mg total) by mouth daily. 30 tablet 3  . prochlorperazine (COMPAZINE) 10 MG tablet Take 1 tablet (10 mg total) by mouth every 6 (six) hours as needed (Nausea or vomiting). 30 tablet 2  . promethazine (PHENERGAN) 25 MG suppository Place 1 suppository (25 mg total) rectally every 6 (six) hours as needed for nausea or vomiting. 12 each 0   No current facility-administered medications for this visit.    Review of Systems  Constitutional: Positive for malaise/fatigue. Negative for fever, chills and weight loss.  HENT: Negative for congestion, hearing loss, nosebleeds, sore throat and tinnitus.   Eyes: Negative for blurred vision, double vision, pain and discharge.  Respiratory: Negative for cough, hemoptysis, sputum production, shortness of breath and wheezing.   Cardiovascular: Negative for chest pain, palpitations, claudication, leg swelling and PND.  Gastrointestinal: Negative for heartburn, nausea, vomiting, abdominal pain, diarrhea, constipation, blood in stool and melena.  Genitourinary: Negative for dysuria, urgency, frequency and hematuria.  Musculoskeletal: Negative for myalgias, joint pain and falls.  Skin: Negative for itching and  rash.  Neurological: Negative for dizziness, tingling, tremors, sensory change, speech change, focal weakness, seizures, loss of consciousness, weakness and headaches.  Endo/Heme/Allergies: Does not bruise/bleed easily.  Psychiatric/Behavioral: Negative for depression, suicidal ideas, memory loss and substance abuse. The patient is not nervous/anxious and does not have insomnia.     PHYSICAL EXAMINATION: ECOG PERFORMANCE STATUS: 1 - Symptomatic but completely ambulatory  Filed Vitals:   10/20/14 0900  BP: 138/95  Pulse: 84  Temp: 97.7 F (36.5 C)  Resp: 20   Filed Weights   10/20/14 0900  Weight: 244 lb (110.678 kg)     Physical Exam  Constitutional: She is oriented to person, place, and time and well-developed, well-nourished, and in no distress.  HENT:  Head: Normocephalic and atraumatic.  Nose: Nose normal.  Mouth/Throat: Oropharynx is clear and moist. No oropharyngeal exudate.  Eyes: Conjunctivae and EOM are normal. Pupils are equal, round, and reactive to  light. Right eye exhibits no discharge. Left eye exhibits no discharge. No scleral icterus.  Neck: Normal range of motion. Neck supple. No tracheal deviation present. No thyromegaly present.  Cardiovascular: Normal rate, regular rhythm and normal heart sounds.  Exam reveals no gallop and no friction rub.   No murmur heard. Pulmonary/Chest: Effort normal and breath sounds normal. She has no wheezes. She has no rales.  Abdominal: Soft. Bowel sounds are normal. She exhibits no distension and no mass. There is no tenderness. There is no rebound and no guarding.  Musculoskeletal: Normal range of motion. She exhibits no edema.  Lymphadenopathy:    She has no cervical adenopathy.  Neurological: She is alert and oriented to person, place, and time. She has normal reflexes. No cranial nerve deficit. Gait normal. Coordination normal.  Skin: Skin is warm and dry. No rash noted.  Psychiatric: Mood, memory, affect and judgment  normal.  Nursing note and vitals reviewed.    LABORATORY DATA:  I have reviewed the data as listed  No laboratory studies today  Results for AVERLEE, SWARTZ (MRN [id number])   Ref. 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 She has been treated with FOLFOX and Avastin and per available records appears to be responding as indicated by CT of the chest in December and a decline in her CEA. She has difficulty tolerating her chemotherapy with severe nausea and vomiting and excessive fatigue of 5-7 days' duration. She has frequent ER visits for hydration.  She was retreated last week with FOLFOX, we dose reduced her oxaliplatin. She has done markedly better. He wishes to proceed with ongoing therapy. We will have to delay her treatment next week as she is going out of town on a cruise. We will bring her in the following Monday for her next cycle.We will ontinue to monitor CEA levels and consider imaging if needed for ongoing disease response.  All questions were answered. The patient knows to call the clinic with any problems, questions or concerns. This note was electronically signed.    Molli Hazard, MD 11/14/2014 12:29 PM

## 2014-11-15 NOTE — Assessment & Plan Note (Addendum)
Stage IV CRC on FOLFOX + Avastin therapy, previously diagnosed and treated at [facility] until she transferred her oncology care to [facility] in March 2016.  Oncology history updated.   GI blood loss reported.  Stool cards are of no utility with the patient's obvious description.  She does not have a GI physician she reports.  She is currently on anticoagulation with Xarelto and I have asked nursing to contact her pharmacy to verify that as it is not on her medication list and I do not see if documented anywhere.  She is to continue with Xarelto as this is not a contraindication for Xarelto particularly in light of her updated labs results today.  Labs today: CBC diff, iron/TIBC, ferritin, INR, retic count.  Return as scheduled for follow-up.

## 2014-11-15 NOTE — Progress Notes (Signed)
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 female returns for followup of Stage IV CRC.    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   02/20/2014 Imaging CT of abdomen and pelvis on 02/20/2014 at [facility] showing 3.6 cm apple core lesion in the proximal sigmoid colon 11 mm hypoenhancing lesion in the medial left hepatic dome, 4.1 cm right lower lobe mass and adjacent right lower lobe nodule   04/17/2014 Imaging Right upper extremity DVT 04/17/2014 paired brachial veins, axillary vein, and peripheral aspect of the right subclavian vein   07/06/2014 Imaging CT chest 07/06/2014 with bilateral pulmonary nodules and masses, RUL, lateral RUL, posterior LUL, lobulated nodule in the subpleural RLL   08/18/2014 Tumor Marker CEA 93.7 ng/ml   10/09/2014 Initial Diagnosis 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  The results are noted within this dictation.  Audie reports that she is experiencing some diarrhea that began yesterday.  She notes her energy is slightly decreased.  She notes a 3-4 day history of blood in stool x 3-4 days.  She admits that she sees blood in her toilet paper, in the toilet water, and incorporated within the stool.  She denies a history of hemorrhoids.  She notes that she is taking an anticoagulant that starts with an "X."  We will update labs today including iron studies.  She notes that she tolerated  her last cycle of chemotherapy very well.  She denies any side effects of chemotherapy.  Oncologically, she otherwise denies any complaints and ROS questioning is negative.   Past Medical History  Diagnosis Date  . Hypertension   . Depression   . Pulmonary nodules 01/30/14    CTA CHEST  . Lymphadenopathy, mediastinal 01/2014    CTA ANGIO .[place]  . Morbid obesity   . Diabetes mellitus, type II   . 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     is allergic to asa.  Ms. Chamblin does not currently have medications on file.  Past Surgical History  Procedure Laterality Date  . Cholecystectomy    . Video bronchoscopy with endobronchial ultrasound N/A 02/06/2014    Procedure: VIDEO BRONCHOSCOPY WITH ENDOBRONCHIAL ULTRASOUND,bronchial biopsies , node sampling;  Surgeon: Melrose Nakayama, MD;  Location: [facility];  Service: Thoracic;  Laterality: N/A;  . Tubal ligation      Denies any headaches, dizziness, double vision, fevers, chills, night sweats, nausea, vomiting, diarrhea, constipation, chest pain, heart palpitations, shortness of breath, blood in stool, black tarry stool, urinary pain, urinary burning, urinary frequency, hematuria.   PHYSICAL EXAMINATION  ECOG PERFORMANCE STATUS: 0 - Asymptomatic  Filed Vitals:   11/16/14 1000  BP: 106/68  Pulse: 83  Temp: 98.5 F (36.9 C)  Resp: 18    GENERAL:alert, no distress, well nourished, well developed, comfortable, cooperative, obese and smiling  SKIN: skin color, texture, turgor are normal, no rashes or significant lesions HEAD: Normocephalic, No masses, lesions, tenderness or abnormalities EYES: normal, PERRLA, EOMI, Conjunctiva are pink and non-injected EARS: External ears normal OROPHARYNX:lips, buccal mucosa, and tongue normal and mucous membranes  are moist  NECK: supple, no adenopathy, thyroid normal size, non-tender, without nodularity, trachea midline LYMPH:  no palpable lymphadenopathy BREAST:not examined LUNGS: clear to auscultation  HEART: regular rate & rhythm ABDOMEN:abdomen soft, non-tender, obese and normal bowel sounds BACK: Back symmetric, no curvature. EXTREMITIES:less then 2 second capillary refill, no joint deformities, effusion, or inflammation, no skin discoloration, no cyanosis  NEURO: alert & oriented x 3 with fluent speech, no focal motor/sensory deficits, gait normal   LABORATORY DATA: CBC    Component Value Date/Time   WBC 9.9 11/16/2014 1214   RBC 4.36 11/16/2014 1214   RBC 4.36 11/16/2014 1214   HGB 13.3 11/16/2014 1214   HCT 40.5 11/16/2014 1214   PLT 251 11/16/2014 1214   MCV 92.9 11/16/2014 1214   MCH 30.5 11/16/2014 1214   MCHC 32.8 11/16/2014 1214   RDW 14.7 11/16/2014 1214   LYMPHSABS 3.0 11/16/2014 1214   MONOABS 0.8 11/16/2014 1214   EOSABS 0.1 11/16/2014 1214   BASOSABS 0.0 11/16/2014 1214      Chemistry      Component Value Date/Time   NA 135 11/16/2014 1214   K 4.0 11/16/2014 1214   CL 105 11/16/2014 1214   CO2 24 11/16/2014 1214   BUN 10 11/16/2014 1214   CREATININE 0.75 11/16/2014 1214      Component Value Date/Time   CALCIUM 9.1 11/16/2014 1214   ALKPHOS 82 11/16/2014 1214   AST 28 11/16/2014 1214   ALT 25 11/16/2014 1214   BILITOT 0.5 11/16/2014 1214     Lab Results  Component Value Date   CEA 135.6* 10/13/2014      ASSESSMENT AND PLAN:  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  Oncology history updated.   GI blood loss reported.  Stool cards are of no utility with the patient's obvious description.  She does not have a GI physician she reports.  She is currently on anticoagulation with Xarelto  and I have asked nursing to contact her pharmacy to verify that as it is not on her medication list and I do not see if documented anywhere.  She is to continue with Xarelto as this is not a contraindication for Xarelto particularly in light of her updated labs results today.  Labs today: CBC diff, iron/TIBC, ferritin, INR, retic count.  Return as scheduled for follow-up.     THERAPY PLAN:  Continue treatment as planned and monitor for progression of disease and toxicities of treatment.  All questions were answered. The patient knows to call the clinic with any problems, questions or concerns. We can certainly see the patient much sooner if necessary.  Patient and plan discussed with Dr. Ancil Linsey and she is in agreement with the aforementioned.   This note is electronically signed by: Robynn Pane 11/16/2014 3:59 PM

## 2014-11-16 ENCOUNTER — Encounter (HOSPITAL_COMMUNITY): Attending: Hematology & Oncology | Admitting: Oncology

## 2014-11-16 VITALS — BP 106/68 | HR 83 | Temp 98.5°F | Resp 18 | Wt 243.9 lb

## 2014-11-16 DIAGNOSIS — C187 Malignant neoplasm of sigmoid colon: Secondary | ICD-10-CM | POA: Diagnosis not present

## 2014-11-16 DIAGNOSIS — K922 Gastrointestinal hemorrhage, unspecified: Secondary | ICD-10-CM | POA: Diagnosis not present

## 2014-11-16 DIAGNOSIS — C189 Malignant neoplasm of colon, unspecified: Secondary | ICD-10-CM

## 2014-11-16 DIAGNOSIS — Z86718 Personal history of other venous thrombosis and embolism: Secondary | ICD-10-CM

## 2014-11-16 DIAGNOSIS — K921 Melena: Secondary | ICD-10-CM

## 2014-11-16 DIAGNOSIS — C7800 Secondary malignant neoplasm of unspecified lung: Secondary | ICD-10-CM | POA: Insufficient documentation

## 2014-11-16 DIAGNOSIS — Z7901 Long term (current) use of anticoagulants: Secondary | ICD-10-CM

## 2014-11-16 LAB — COMPREHENSIVE METABOLIC PANEL
ALK PHOS: 82 U/L (ref 38–126)
ALT: 25 U/L (ref 14–54)
AST: 28 U/L (ref 15–41)
Albumin: 3.8 g/dL (ref 3.5–5.0)
Anion gap: 6 (ref 5–15)
BILIRUBIN TOTAL: 0.5 mg/dL (ref 0.3–1.2)
BUN: 10 mg/dL (ref 6–20)
CO2: 24 mmol/L (ref 22–32)
Calcium: 9.1 mg/dL (ref 8.9–10.3)
Chloride: 105 mmol/L (ref 101–111)
Creatinine, Ser: 0.75 mg/dL (ref 0.44–1.00)
GFR calc Af Amer: >60 mL/min (ref >60)
GFR calc non Af Amer: >60 mL/min (ref >60)
GLUCOSE: 132 mg/dL — AB (ref 70–99)
Potassium: 4 mmol/L (ref 3.5–5.1)
Sodium: 135 mmol/L (ref 135–145)
Total Protein: 8 g/dL (ref 6.5–8.1)

## 2014-11-16 LAB — CBC WITH DIFFERENTIAL/PLATELET
BASOS ABS: 0 10*3/uL (ref 0.0–0.1)
BASOS PCT: 0 % (ref 0–1)
EOS ABS: 0.1 10*3/uL (ref 0.0–0.7)
Eosinophils Relative: 1 % (ref 0–5)
HCT: 40.5 % (ref 36.0–46.0)
Hemoglobin: 13.3 g/dL (ref 12.0–15.0)
Lymphocytes Relative: 30 % (ref 12–46)
Lymphs Abs: 3 10*3/uL (ref 0.7–4.0)
MCH: 30.5 pg (ref 26.0–34.0)
MCHC: 32.8 g/dL (ref 30.0–36.0)
MCV: 92.9 fL (ref 78.0–100.0)
Monocytes Absolute: 0.8 10*3/uL (ref 0.1–1.0)
Monocytes Relative: 8 % (ref 3–12)
NEUTROS PCT: 61 % (ref 43–77)
Neutro Abs: 6 10*3/uL (ref 1.7–7.7)
PLATELETS: 251 10*3/uL (ref 150–400)
RBC: 4.36 MIL/uL (ref 3.87–5.11)
RDW: 14.7 % (ref 11.5–15.5)
WBC: 9.9 10*3/uL (ref 4.0–10.5)

## 2014-11-16 LAB — RETICULOCYTES
RBC.: 4.36 MIL/uL (ref 3.87–5.11)
Retic Count, Absolute: 135.2 10*3/uL (ref 19.0–186.0)
Retic Ct Pct: 3.1 % (ref 0.4–3.1)

## 2014-11-16 LAB — PROTIME-INR
INR: 1.05 (ref 0.00–1.49)
Prothrombin Time: 13.9 seconds (ref 11.6–15.2)

## 2014-11-16 NOTE — Progress Notes (Signed)
Erica Barker presented for labwork. Labs per MD order drawn via Peripheral Line 23 gauge needle inserted in right AC  Good blood return present. Procedure without incident.  Needle removed intact. Patient tolerated procedure well.

## 2014-11-16 NOTE — Patient Instructions (Signed)
[place] at [facility] Discharge Instructions  RECOMMENDATIONS MADE BY THE CONSULTANT AND ANY TEST RESULTS WILL BE SENT TO YOUR REFERRING PHYSICIAN.  Exam and discussion by Robynn Pane, PA-C Will check some blood work today and will see you back as scheduled for chemotherapy on office visit on Monday.   Thank you for choosing [place] at [facility] to provide your oncology and hematology care.  To afford each patient quality time with our provider, please arrive at least 15 minutes before your scheduled appointment time.    You need to re-schedule your appointment should you arrive 10 or more minutes late.  We strive to give you quality time with our providers, and arriving late affects you and other patients whose appointments are after yours.  Also, if you no show three or more times for appointments you may be dismissed from the clinic at the providers discretion.     Again, thank you for choosing [facility].  Our hope is that these requests will decrease the amount of time that you wait before being seen by our physicians.       _____________________________________________________________  Should you have questions after your visit to [facility], please contact our office at [phone] between the hours of 8:30 a.m. and 4:30 p.m.  Voicemails left after 4:30 p.m. will not be returned until the following business day.  For prescription refill requests, have your pharmacy contact our office.

## 2014-11-17 ENCOUNTER — Other Ambulatory Visit (HOSPITAL_COMMUNITY): Payer: Self-pay | Admitting: Oncology

## 2014-11-17 DIAGNOSIS — E611 Iron deficiency: Secondary | ICD-10-CM

## 2014-11-17 LAB — IRON AND TIBC
Iron: 114 ug/dL (ref 28–170)
Saturation Ratios: 30 % (ref 10.4–31.8)
TIBC: 379 ug/dL (ref 250–450)
UIBC: 265 ug/dL

## 2014-11-17 LAB — FERRITIN: Ferritin: 61 ng/mL (ref 11–307)

## 2014-11-17 LAB — CEA: CEA: 148.3 ng/mL — ABNORMAL HIGH (ref 0.0–4.7)

## 2014-11-17 NOTE — Addendum Note (Signed)
Addended by: Mellissa Kohut on: 11/17/2014 09:35 AM   Modules accepted: Medications

## 2014-11-20 ENCOUNTER — Encounter (HOSPITAL_BASED_OUTPATIENT_CLINIC_OR_DEPARTMENT_OTHER): Admitting: Oncology

## 2014-11-20 ENCOUNTER — Encounter (HOSPITAL_COMMUNITY): Payer: Self-pay | Admitting: Oncology

## 2014-11-20 ENCOUNTER — Encounter (HOSPITAL_BASED_OUTPATIENT_CLINIC_OR_DEPARTMENT_OTHER)

## 2014-11-20 VITALS — BP 148/102 | HR 85 | Temp 98.1°F | Resp 20

## 2014-11-20 VITALS — BP 128/88 | HR 88 | Temp 98.5°F | Resp 20 | Wt 248.7 lb

## 2014-11-20 DIAGNOSIS — K922 Gastrointestinal hemorrhage, unspecified: Secondary | ICD-10-CM | POA: Diagnosis not present

## 2014-11-20 DIAGNOSIS — C189 Malignant neoplasm of colon, unspecified: Secondary | ICD-10-CM | POA: Diagnosis not present

## 2014-11-20 DIAGNOSIS — Z5112 Encounter for antineoplastic immunotherapy: Secondary | ICD-10-CM | POA: Diagnosis not present

## 2014-11-20 DIAGNOSIS — C187 Malignant neoplasm of sigmoid colon: Secondary | ICD-10-CM | POA: Diagnosis not present

## 2014-11-20 DIAGNOSIS — Z86718 Personal history of other venous thrombosis and embolism: Secondary | ICD-10-CM

## 2014-11-20 DIAGNOSIS — Z5111 Encounter for antineoplastic chemotherapy: Secondary | ICD-10-CM

## 2014-11-20 DIAGNOSIS — C7800 Secondary malignant neoplasm of unspecified lung: Secondary | ICD-10-CM

## 2014-11-20 LAB — URINALYSIS, DIPSTICK ONLY
Bilirubin Urine: NEGATIVE
GLUCOSE, UA: 100 mg/dL — AB
Ketones, ur: NEGATIVE mg/dL
Leukocytes, UA: NEGATIVE
NITRITE: NEGATIVE
Protein, ur: NEGATIVE mg/dL
Urobilinogen, UA: 0.2 mg/dL (ref 0.0–1.0)
pH: 5.5 (ref 5.0–8.0)

## 2014-11-20 MED ORDER — SODIUM CHLORIDE 0.9 % IV SOLN
Freq: Once | INTRAVENOUS | Status: AC
  Administered 2014-11-20: 10:00:00 via INTRAVENOUS
  Filled 2014-11-20: qty 5

## 2014-11-20 MED ORDER — LORAZEPAM 2 MG/ML IJ SOLN
1.0000 mg | Freq: Once | INTRAMUSCULAR | Status: AC
  Administered 2014-11-20: 1 mg via INTRAVENOUS
  Filled 2014-11-20: qty 1

## 2014-11-20 MED ORDER — SODIUM CHLORIDE 0.9 % IJ SOLN
10.0000 mL | INTRAMUSCULAR | Status: DC | PRN
  Administered 2014-11-20: 10 mL
  Filled 2014-11-20: qty 10

## 2014-11-20 MED ORDER — SODIUM CHLORIDE 0.9 % IV SOLN
2400.0000 mg/m2 | INTRAVENOUS | Status: DC
  Administered 2014-11-20: 5400 mg via INTRAVENOUS
  Filled 2014-11-20: qty 108

## 2014-11-20 MED ORDER — PALONOSETRON HCL INJECTION 0.25 MG/5ML
0.2500 mg | Freq: Once | INTRAVENOUS | Status: AC
Start: 2014-11-20 — End: 2014-11-20
  Administered 2014-11-20: 0.25 mg via INTRAVENOUS
  Filled 2014-11-20: qty 5

## 2014-11-20 MED ORDER — OXALIPLATIN CHEMO INJECTION 100 MG/20ML
68.0000 mg/m2 | Freq: Once | INTRAVENOUS | Status: AC
  Administered 2014-11-20: 150 mg via INTRAVENOUS
  Filled 2014-11-20: qty 30

## 2014-11-20 MED ORDER — DEXTROSE 5 % IV SOLN
Freq: Once | INTRAVENOUS | Status: AC
  Administered 2014-11-20: 10:00:00 via INTRAVENOUS

## 2014-11-20 MED ORDER — LEUCOVORIN CALCIUM INJECTION 350 MG
400.0000 mg/m2 | Freq: Once | INTRAMUSCULAR | Status: AC
  Administered 2014-11-20: 896 mg via INTRAVENOUS
  Filled 2014-11-20: qty 44.8

## 2014-11-20 MED ORDER — SODIUM CHLORIDE 0.9 % IV SOLN
5.0000 mg/kg | Freq: Once | INTRAVENOUS | Status: AC
  Administered 2014-11-20: 550 mg via INTRAVENOUS
  Filled 2014-11-20: qty 16

## 2014-11-20 MED ORDER — SODIUM CHLORIDE 0.9 % IV SOLN: 12.0000 mg | Freq: Once | INTRAVENOUS | Status: DC

## 2014-11-20 NOTE — Progress Notes (Signed)
Erica Serve, PA [address] [place] [address]  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  CURRENT THERAPY: FOLFOX (dose reduced) + Avastin previously treated at [facility] with transfer of oncology care to [facility]  INTERVAL HISTORY: Erica Barker 54 y.o. female returns for followup of Stage IV CRC.    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   02/20/2014 Imaging CT of abdomen and pelvis on 02/20/2014 at [facility] showing 3.6 cm apple core lesion in the proximal sigmoid colon 11 mm hypoenhancing lesion in the medial left hepatic dome, 4.1 cm right lower lobe mass and adjacent right lower lobe nodule   04/17/2014 Imaging Right upper extremity DVT 04/17/2014 paired brachial veins, axillary vein, and peripheral aspect of the right subclavian vein   07/06/2014 Imaging CT chest 07/06/2014 with bilateral pulmonary nodules and masses, RUL, lateral RUL, posterior LUL, lobulated nodule in the subpleural RLL   08/18/2014 Tumor Marker CEA 93.7 ng/ml   10/09/2014 Initial Diagnosis 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  The results are noted within this dictation.  Her blood counts are very stable despite GI blood loss.  She reports intermittent blood in stool.  She reports it is stable since last week.  Her last experience was over the weekend.  She denies blood in her stool on every BM.  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  She reports that she feels well.  She is not a fan of being in the clinic for chemotherapy.  "How many more of these do I have?"  I provided education regarding treatment in the metastatic setting.   Oncologically, she denies any specific complaints otherwise and ROS questioning is negative.   Past Medical History    Diagnosis Date  . Hypertension   . Depression   . Pulmonary nodules 01/30/14    CTA CHEST  . Lymphadenopathy, mediastinal 01/2014    CTA ANGIO .[place]  . Morbid obesity   . Diabetes mellitus, type II   . 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     is allergic to asa.  Ms. Erica Barker does not currently have medications on file.  Past Surgical History  Procedure Laterality Date  . Cholecystectomy    . Video bronchoscopy with endobronchial ultrasound N/A 02/06/2014    Procedure: VIDEO BRONCHOSCOPY WITH ENDOBRONCHIAL ULTRASOUND,bronchial biopsies , node sampling;  Surgeon: Erica Nakayama, MD;  Location: [place];  Service: Thoracic;  Laterality: N/A;  . Tubal ligation      Denies any headaches, dizziness, double vision, fevers, chills, night sweats, nausea, vomiting, diarrhea, constipation, chest pain, heart palpitations, shortness of breath, blood in stool, black tarry stool, urinary pain, urinary burning, urinary frequency, hematuria.   PHYSICAL EXAMINATION  ECOG PERFORMANCE STATUS: 0 - Asymptomatic  Filed Vitals:   11/20/14 0907  BP: 128/88  Pulse: 88  Temp: 98.5 F (36.9 C)  Resp: 20    GENERAL:alert, no distress, well nourished, well developed, comfortable, cooperative, obese and smiling, accompanied by daughter. SKIN: skin color, texture, turgor are normal, no rashes or significant lesions HEAD: Normocephalic, No masses, lesions, tenderness or abnormalities EYES: normal, PERRLA,  EOMI, Conjunctiva are pink and non-injected EARS: External ears normal OROPHARYNX:lips, buccal mucosa, and tongue normal and mucous membranes are moist  NECK: supple, no adenopathy, thyroid normal size, non-tender, without nodularity, no stridor, non-tender, trachea midline LYMPH:  no palpable lymphadenopathy, no  hepatosplenomegaly BREAST:not examined LUNGS: clear to auscultation  HEART: regular rate & rhythm, no murmurs, no gallops, S1 normal and S2 normal ABDOMEN:abdomen soft, non-tender, obese and normal bowel sounds BACK: Back symmetric, no curvature. EXTREMITIES:less then 2 second capillary refill, no joint deformities, effusion, or inflammation, no skin discoloration, no cyanosis  NEURO: alert & oriented x 3 with fluent speech, no focal motor/sensory deficits, gait normal   LABORATORY DATA: CBC    Component Value Date/Time   WBC 9.9 11/16/2014 1214   RBC 4.36 11/16/2014 1214   RBC 4.36 11/16/2014 1214   HGB 13.3 11/16/2014 1214   HCT 40.5 11/16/2014 1214   PLT 251 11/16/2014 1214   MCV 92.9 11/16/2014 1214   MCH 30.5 11/16/2014 1214   MCHC 32.8 11/16/2014 1214   RDW 14.7 11/16/2014 1214   LYMPHSABS 3.0 11/16/2014 1214   MONOABS 0.8 11/16/2014 1214   EOSABS 0.1 11/16/2014 1214   BASOSABS 0.0 11/16/2014 1214      Chemistry      Component Value Date/Time   NA 135 11/16/2014 1214   K 4.0 11/16/2014 1214   CL 105 11/16/2014 1214   CO2 24 11/16/2014 1214   BUN 10 11/16/2014 1214   CREATININE 0.75 11/16/2014 1214      Component Value Date/Time   CALCIUM 9.1 11/16/2014 1214   ALKPHOS 82 11/16/2014 1214   AST 28 11/16/2014 1214   ALT 25 11/16/2014 1214   BILITOT 0.5 11/16/2014 1214     Lab Results  Component Value Date   CEA 148.3* 11/16/2014      ASSESSMENT AND PLAN:  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 Stage IV CRC on FOLFOX + Avastin therapy, previously diagnosed and treated at [facility] until she transferred her oncology care to [facility] in March 2016.  She is on Xarelto anticoagulation  Labs today: CBC diff, CMET, UA.  Given her GI blood loss, I will give 125 mg of ferric gluconate 1 time for her ferritin of 61.  Her Hgb is WNL, but she does have a small calculated iron deficit.  She is agreeable to this plan of action.  IV iron will be either  given today or Wednesday when she has her chemo-pump removed.  Return in 2 weeks for follow-up.   THERAPY PLAN:  Continue with treatment as planned.  All questions were answered. The patient knows to call the clinic with any problems, questions or concerns. We can certainly see the patient much sooner if necessary.  Patient and plan discussed with Dr. Ancil Linsey and she is in agreement with the aforementioned.   This note is electronically signed by: Robynn Pane 11/20/2014 9:33 AM

## 2014-11-20 NOTE — Progress Notes (Signed)
Tolerated chemo well. Erica Barker notified of patient's blood pressure and that she has not taken her daily lisinopril. Instruct patient to take medication as prescribed when she gets home. D/C home with family,continuous infusion pump intact.

## 2014-11-20 NOTE — Patient Instructions (Signed)
[place] at [facility]  Discharge Instructions:  Exam completed by Kirby Crigler today Chemotherapy today: folfox with avastin Return Wednesday to have your pump taken off. We are getting your iron scheduled for you. Follow up to see the doctor in 2 weeks and in 4 weeks. Chemotherapy as scheduled every 2 weeks.   Continue taking Xarelto. Please call the clinic if you have any questions or concerns.   _______________________________________________________________  Thank you for choosing [place] at [facility] to provide your oncology and hematology care.  To afford each patient quality time with our providers, please arrive at least 15 minutes before your scheduled appointment.  You need to re-schedule your appointment if you arrive 10 or more minutes late.  We strive to give you quality time with our providers, and arriving late affects you and other patients whose appointments are after yours.  Also, if you no show three or more times for appointments you may be dismissed from the clinic.  Again, thank you for choosing [place] at [place] hope is that these requests will allow you access to exceptional care and in a timely manner. _______________________________________________________________  If you have questions after your visit, please contact our office at [phone] between the hours of 8:30 a.m. and 5:00 p.m. Voicemails left after 4:30 p.m. will not be returned until the following business day. _______________________________________________________________  For prescription refill requests, have your pharmacy contact our office. _______________________________________________________________  Recommendations made by the consultant and any test results will be sent to your referring physician. _______________________________________________________________

## 2014-11-20 NOTE — Patient Instructions (Signed)
[place] at [facility]  Discharge Instructions:  Exam completed by Kirby Crigler today Chemotherapy today: folfox with avastin Return Wednesday to have your pump taken off. We are getting your iron scheduled for you. Follow up to see the doctor in 2 weeks and in 4 weeks. Chemotherapy as scheduled every 2 weeks.   Continue taking Xarelto. Please call the clinic if you have any questions or concerns.   _______________________________________________________________  Thank you for choosing [place] at [facility] to provide your oncology and hematology care.  To afford each patient quality time with our providers, please arrive at least 15 minutes before your scheduled appointment.  You need to re-schedule your appointment if you arrive 10 or more minutes late.  We strive to give you quality time with our providers, and arriving late affects you and other patients whose appointments are after yours.  Also, if you no show three or more times for appointments you may be dismissed from the clinic.  Again, thank you for choosing [place] at [place] hope is that these requests will allow you access to exceptional care and in a timely manner. _______________________________________________________________  If you have questions after your visit, please contact our office at [phone] between the hours of 8:30 a.m. and 5:00 p.m. Voicemails left after 4:30 p.m. will not be returned until the following business day. _______________________________________________________________  For prescription refill requests, have your pharmacy contact our office. _______________________________________________________________  Recommendations made by the consultant and any test results will be sent to your referring physician. _______________________________________________________________

## 2014-11-20 NOTE — Assessment & Plan Note (Addendum)
Stage IV CRC on FOLFOX + Avastin therapy, previously diagnosed and treated at [facility] until she transferred her oncology care to [facility] in March 2016.  She is on Xarelto anticoagulation  Labs today: CBC diff, CMET, UA.  Given her GI blood loss, I will give 125 mg of ferric gluconate 1 time for her ferritin of 61.  Her Hgb is WNL, but she does have a small calculated iron deficit.  She is agreeable to this plan of action.  IV iron will be either given today or Wednesday when she has her chemo-pump removed.  Return in 2 weeks for follow-up.

## 2014-11-21 ENCOUNTER — Inpatient Hospital Stay (HOSPITAL_COMMUNITY)

## 2014-11-21 ENCOUNTER — Ambulatory Visit (HOSPITAL_COMMUNITY): Admitting: Hematology & Oncology

## 2014-11-22 ENCOUNTER — Ambulatory Visit (HOSPITAL_COMMUNITY): Admitting: Hematology & Oncology

## 2014-11-22 ENCOUNTER — Inpatient Hospital Stay (HOSPITAL_COMMUNITY)

## 2014-11-22 ENCOUNTER — Encounter (HOSPITAL_COMMUNITY)

## 2014-11-22 ENCOUNTER — Encounter (HOSPITAL_BASED_OUTPATIENT_CLINIC_OR_DEPARTMENT_OTHER)

## 2014-11-22 VITALS — BP 128/84 | HR 77 | Temp 98.2°F | Resp 20

## 2014-11-22 DIAGNOSIS — E611 Iron deficiency: Secondary | ICD-10-CM

## 2014-11-22 DIAGNOSIS — C187 Malignant neoplasm of sigmoid colon: Secondary | ICD-10-CM | POA: Diagnosis not present

## 2014-11-22 DIAGNOSIS — C189 Malignant neoplasm of colon, unspecified: Secondary | ICD-10-CM

## 2014-11-22 DIAGNOSIS — C7800 Secondary malignant neoplasm of unspecified lung: Secondary | ICD-10-CM

## 2014-11-22 MED ORDER — SODIUM CHLORIDE 0.9 % IV SOLN
125.0000 mg | Freq: Once | INTRAVENOUS | Status: AC
  Administered 2014-11-22: 125 mg via INTRAVENOUS
  Filled 2014-11-22: qty 10

## 2014-11-22 MED ORDER — SODIUM CHLORIDE 0.9 % IJ SOLN
10.0000 mL | INTRAMUSCULAR | Status: DC | PRN
  Administered 2014-11-22: 10 mL
  Filled 2014-11-22: qty 10

## 2014-11-22 MED ORDER — PALONOSETRON HCL INJECTION 0.25 MG/5ML
INTRAVENOUS | Status: AC
  Filled 2014-11-22: qty 5

## 2014-11-22 MED ORDER — HEPARIN SOD (PORK) LOCK FLUSH 100 UNIT/ML IV SOLN
500.0000 [IU] | Freq: Once | INTRAVENOUS | Status: AC | PRN
  Administered 2014-11-22: 500 [IU]

## 2014-11-22 MED ORDER — PALONOSETRON HCL INJECTION 0.25 MG/5ML
0.2500 mg | Freq: Once | INTRAVENOUS | Status: AC
  Administered 2014-11-22: 0.25 mg via INTRAVENOUS

## 2014-11-22 MED ORDER — SODIUM CHLORIDE 0.9 % IV SOLN
INTRAVENOUS | Status: DC
  Administered 2014-11-22: 12:00:00 via INTRAVENOUS

## 2014-11-22 MED ORDER — HEPARIN SOD (PORK) LOCK FLUSH 100 UNIT/ML IV SOLN
INTRAVENOUS | Status: AC
  Filled 2014-11-22: qty 5

## 2014-11-22 NOTE — Patient Instructions (Signed)
[place] at [facility] Discharge Instructions  RECOMMENDATIONS MADE BY THE CONSULTANT AND ANY TEST RESULTS WILL BE SENT TO YOUR REFERRING PHYSICIAN.  Ferric gluconate 125 mg infusion as ordered. Aloxi IV push as ordered. Return as scheduled.  Thank you for choosing [place] at [facility] to provide your oncology and hematology care.  To afford each patient quality time with our provider, please arrive at least 15 minutes before your scheduled appointment time.    You need to re-schedule your appointment should you arrive 10 or more minutes late.  We strive to give you quality time with our providers, and arriving late affects you and other patients whose appointments are after yours.  Also, if you no show three or more times for appointments you may be dismissed from the clinic at the providers discretion.     Again, thank you for choosing [facility].  Our hope is that these requests will decrease the amount of time that you wait before being seen by our physicians.       _____________________________________________________________  Should you have questions after your visit to [facility], please contact our office at [phone] between the hours of 8:30 a.m. and 4:30 p.m.  Voicemails left after 4:30 p.m. will not be returned until the following business day.  For prescription refill requests, have your pharmacy contact our office.

## 2014-11-22 NOTE — Progress Notes (Signed)
D/C continuous infusion pump. Patient denies any complaints post chemo, reports slept most of yesterday. Iron infusion as ordered. Flushed port per protocol. Tolerated iron infusion well.

## 2014-11-23 ENCOUNTER — Encounter (HOSPITAL_COMMUNITY)

## 2014-11-24 ENCOUNTER — Encounter (HOSPITAL_COMMUNITY)

## 2014-11-28 ENCOUNTER — Telehealth (HOSPITAL_COMMUNITY): Payer: Self-pay

## 2014-11-28 ENCOUNTER — Other Ambulatory Visit (HOSPITAL_COMMUNITY): Payer: Self-pay | Admitting: Oncology

## 2014-11-28 DIAGNOSIS — K121 Other forms of stomatitis: Secondary | ICD-10-CM

## 2014-11-28 MED ORDER — FIRST-DUKES MOUTHWASH MT SUSP: 5.0000 mL | Freq: Four times a day (QID) | OROMUCOSAL | Status: DC | PRN

## 2014-11-28 NOTE — Telephone Encounter (Signed)
Message left for patient that medication has been e-scribed to [facility] in [place], [place].

## 2014-11-28 NOTE — Telephone Encounter (Signed)
Done

## 2014-11-28 NOTE — Telephone Encounter (Signed)
Has some mouth sores and would like prescription for "magic mouthwash" to [facility] in [place].

## 2014-12-03 ENCOUNTER — Encounter (HOSPITAL_COMMUNITY): Payer: Self-pay | Admitting: Hematology & Oncology

## 2014-12-03 NOTE — Progress Notes (Signed)
[place] Progress Note  Patient Care Team: [name], [place] as PCP - General Melrose Nakayama, MD as Consulting Physician (Cardiothoracic Surgery)   CHIEF COMPLAINTS/PURPOSE OF CONSULTATION:  Stage IV CRC CT of abdomen and pelvis on 02/20/2014 at [facility] showing 3.6 cm apple core lesion in the proximal sigmoid colon 11 mm hypoenhancing lesion in the medial left hepatic dome, 4.1 cm right lower lobe mass and adjacent right lower lobe nodule CEA on 08/18/2014 of 93.7 ng/ml Right upper extremity DVT 04/17/2014 paired brachial veins, axillary vein, and peripheral aspect of the right subclavian vein CT chest 07/06/2014 with bilateral pulmonary nodules and masses, RUL, lateral RUL, posterior LUL, lobulated nodule in the subpleural RLL,     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   02/20/2014 Imaging CT of abdomen and pelvis on 02/20/2014 at [facility] showing 3.6 cm apple core lesion in the proximal sigmoid colon 11 mm hypoenhancing lesion in the medial left hepatic dome, 4.1 cm right lower lobe mass and adjacent right lower lobe nodule   04/17/2014 Imaging Right upper extremity DVT 04/17/2014 paired brachial veins, axillary vein, and peripheral aspect of the right subclavian vein   07/06/2014 Imaging CT chest 07/06/2014 with bilateral pulmonary nodules and masses, RUL, lateral RUL, posterior LUL, lobulated nodule in the subpleural RLL   08/18/2014 Tumor Marker CEA 93.7 ng/ml   10/09/2014 Initial Diagnosis 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 female is here because of stage IV colon cancer. We dose reduced her oxaliplatin secondary to severe nausea and vomiting. She also had severe fatigue affecting her ADLs for greater than one week after each cycle.   She has gone on a cruise and states she had a great time. She is feeling very well  today with no significant complaints. She had no problems with nausea or vomiting on her vacation. She is here today for ongoing therapy with FOLFOX   MEDICAL HISTORY:  Past Medical History  Diagnosis Date  . Hypertension   . Depression   . Pulmonary nodules 01/30/14    CTA CHEST  . Lymphadenopathy, mediastinal 01/2014    CTA ANGIO .Marland KitchenKraemer  . Morbid obesity   . Diabetes mellitus, type II   . Headache(784.0)     SURGICAL HISTORY: Past Surgical History  Procedure Laterality Date  . Cholecystectomy    . Video bronchoscopy with endobronchial ultrasound N/A 02/06/2014    Procedure: VIDEO BRONCHOSCOPY WITH ENDOBRONCHIAL ULTRASOUND,bronchial biopsies , node sampling;  Surgeon: Melrose Nakayama, MD;  Location: [place];  Service: Thoracic;  Laterality: N/A;  . Tubal ligation      SOCIAL HISTORY: History   Social History  . Marital Status: Married    Spouse Name: N/A  . Number of Children: N/A  . Years of Education: N/A   Occupational History  . Not on file.   Social History Main Topics  . Smoking status: Former Smoker -- 0.50 packs/day for 7 years    Types: Cigarettes    Quit date: 02/03/1989  . Smokeless tobacco: Not on file  . Alcohol Use: Yes     Comment: occasional  . Drug Use: No  . Sexual Activity: Not on file   Other Topics Concern  . Not on file   Social History Narrative  She is in a relationship. 3 children. Aged 32,35,20.  She has no  grandchildren. She smoked in high school but quit after graduation. No ETOH. 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  She is originally from [place].  FAMILY HISTORY: Family History  Problem Relation Age of Onset  . Cancer Mother     UTERINE  . Cancer Father     LEUKEMIA  . Crohn's disease Son    indicated that her mother is alive. She indicated that her father is deceased. She indicated that her son is alive.   Father is deceased at age 43 from leukemia, mother is  alive at 68 with a history of uterine cancer.  She has one sister how is healthy and one brother who is healthy. He works as a Engineer, structural.   ALLERGIES:  is allergic to asa.  MEDICATIONS:  Current Outpatient Prescriptions  Medication Sig Dispense Refill  . aspirin-acetaminophen-caffeine (EXCEDRIN MIGRAINE) 250-250-65 MG per tablet Take 2 tablets by mouth every 6 (six) hours as needed for headache.    . fluorouracil CALGB 78938 in sodium chloride 0.9 % 150 mL Inject into the vein. To infuse over 46 hours every 14 days    . LEUCOVORIN CALCIUM IV Inject into the vein every 14 (fourteen) days.    Marland Kitchen lidocaine-prilocaine (EMLA) cream Apply a quarter size amount to port site 1 hour prior to chemo. Do not rub in. Cover with plastic wrap. 30 g 3  . lisinopril (PRINIVIL,ZESTRIL) 10 MG tablet Take 1 tablet (10 mg total) by mouth daily. 30 tablet 6  . metFORMIN (GLUCOPHAGE) 500 MG tablet Take 500 mg by mouth daily with breakfast.     . Multiple Vitamin (MULTIVITAMIN) capsule Take 1 capsule by mouth daily.    . ondansetron (ZOFRAN) 8 MG tablet Take 1 tablet (8 mg total) by mouth every 8 (eight) hours as needed for nausea or vomiting. 30 tablet 2  . OXALIPLATIN IV Inject into the vein every 14 (fourteen) days.    Marland Kitchen oxyCODONE-acetaminophen (PERCOCET/ROXICET) 5-325 MG per tablet Take 1 tablet by mouth every 4 (four) hours as needed for severe pain.     . pantoprazole (PROTONIX) 40 MG tablet Take 1 tablet (40 mg total) by mouth daily. 30 tablet 3  . prochlorperazine (COMPAZINE) 10 MG tablet Take 1 tablet (10 mg total) by mouth every 6 (six) hours as needed (Nausea or vomiting). 30 tablet 2  . promethazine (PHENERGAN) 25 MG suppository Place 1 suppository (25 mg total) rectally every 6 (six) hours as needed for nausea or vomiting. 12 each 0  . Diphenhyd-Hydrocort-Nystatin (FIRST-DUKES MOUTHWASH) SUSP Use as directed 5 mLs in the mouth or throat 4 (four) times daily as needed. 300 mL 2  . rivaroxaban (XARELTO)  20 MG TABS tablet Take 20 mg by mouth daily with supper.     No current facility-administered medications for this visit.    Review of Systems  Constitutional: Negative for fever, chills, weight loss and malaise/fatigue.  HENT: Negative for congestion, hearing loss, nosebleeds, sore throat and tinnitus.   Eyes: Negative for blurred vision, double vision, pain and discharge.  Respiratory: Negative for cough, hemoptysis, sputum production, shortness of breath and wheezing.   Cardiovascular: Negative for chest pain, palpitations, claudication, leg swelling and PND.  Gastrointestinal: Negative for heartburn, nausea, vomiting, abdominal pain, diarrhea, constipation, blood in stool and melena.  Genitourinary: Negative for dysuria, urgency, frequency and hematuria.  Musculoskeletal: Negative for myalgias, joint pain and falls.  Skin: Negative for itching and rash.  Neurological:  Negative for dizziness, tingling, tremors, sensory change, speech change, focal weakness, seizures, loss of consciousness, weakness and headaches.  Endo/Heme/Allergies: Does not bruise/bleed easily.  Psychiatric/Behavioral: Negative for depression, suicidal ideas, memory loss and substance abuse. The patient is not nervous/anxious and does not have insomnia.     PHYSICAL EXAMINATION: ECOG PERFORMANCE STATUS: 1 - Symptomatic but completely ambulatory  Filed Vitals:   11/06/14 0800  BP: 119/78  Pulse: 78  Temp: 97.5 F (36.4 C)  Resp: 20   Filed Weights   11/06/14 0800  Weight: 249 lb 3.2 oz (113.036 kg)     Physical Exam  Constitutional: She is oriented to person, place, and time and well-developed, well-nourished, and in no distress.  HENT:  Head: Normocephalic and atraumatic.  Nose: Nose normal.  Mouth/Throat: Oropharynx is clear and moist. No oropharyngeal exudate.  Eyes: Conjunctivae and EOM are normal. Pupils are equal, round, and reactive to light. Right eye exhibits no discharge. Left eye exhibits no  discharge. No scleral icterus.  Neck: Normal range of motion. Neck supple. No tracheal deviation present. No thyromegaly present.  Cardiovascular: Normal rate, regular rhythm and normal heart sounds.  Exam reveals no gallop and no friction rub.   No murmur heard. Pulmonary/Chest: Effort normal and breath sounds normal. She has no wheezes. She has no rales.  Abdominal: Soft. Bowel sounds are normal. She exhibits no distension and no mass. There is no tenderness. There is no rebound and no guarding.  Musculoskeletal: Normal range of motion. She exhibits no edema.  Lymphadenopathy:    She has no cervical adenopathy.  Neurological: She is alert and oriented to person, place, and time. She has normal reflexes. No cranial nerve deficit. Gait normal. Coordination normal.  Skin: Skin is warm and dry. No rash noted.  Psychiatric: Mood, memory, affect and judgment normal.  Nursing note and vitals reviewed.    LABORATORY DATA:  I have reviewed the data as listed  Results for Erica Barker, Erica Barker (MRN [id number])   Ref. Range 11/06/2014 09:00  Sodium Latest Ref Range: 135-145 mmol/L 137  Potassium Latest Ref Range: 3.5-5.1 mmol/L 4.0  Chloride Latest Ref Range: 96-112 mmol/L 106  CO2 Latest Ref Range: 19-32 mmol/L 25  BUN Latest Ref Range: 6-23 mg/dL 11  Creatinine Latest Ref Range: 0.50-1.10 mg/dL 5.05  Calcium Latest Ref Range: 8.4-10.5 mg/dL 9.0  EGFR (Non-African Amer.) Latest Ref Range: >90 mL/min >90  EGFR (African American) Latest Ref Range: >90 mL/min >90  Glucose Latest Ref Range: 70-99 mg/dL 091 (H)  Anion gap Latest Ref Range: 5-15  6  Alkaline Phosphatase Latest Ref Range: 39-117 U/L 70  Albumin Latest Ref Range: 3.5-5.2 g/dL 3.4 (L)  AST Latest Ref Range: 0-37 U/L 24  ALT Latest Ref Range: 0-35 U/L 20  Total Protein Latest Ref Range: 6.0-8.3 g/dL 7.3  Total Bilirubin Latest Ref Range: 0.3-1.2 mg/dL 0.2 (L)  WBC Latest Ref Range: 4.0-10.5 K/uL 9.0  RBC Latest Ref Range: 3.87-5.11  MIL/uL 4.03  Hemoglobin Latest Ref Range: 12.0-15.0 g/dL 85.9  HCT Latest Ref Range: 36.0-46.0 % 37.9  MCV Latest Ref Range: 78.0-100.0 fL 94.0  MCH Latest Ref Range: 26.0-34.0 pg 30.5  MCHC Latest Ref Range: 30.0-36.0 g/dL 95.6  RDW Latest Ref Range: 11.5-15.5 % 14.8  Platelets Latest Ref Range: 150-400 K/uL 206  Neutrophils Latest Ref Range: 43-77 % 52  Lymphocytes Latest Ref Range: 12-46 % 36  Monocytes Relative Latest Ref Range: 3-12 % 10  Eosinophil Latest Ref Range: 0-5 % 2  Basophil Latest Ref Range: 0-1 % 0  NEUT# Latest Ref Range: 1.7-7.7 K/uL 4.6  Lymphocyte # Latest Ref Range: 0.7-4.0 K/uL 3.3  Monocyte # Latest Ref Range: 0.1-1.0 K/uL 0.9  Eosinophils Absolute Latest Ref Range: 0.0-0.7 K/uL 0.2  Basophils Absolute Latest Ref Range: 0.0-0.1 K/uL 0.0    Results for Erica Barker, Erica Barker (MRN [id number])   Ref. 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 She has been treated with FOLFOX and Avastin and per available records appears to be responding as indicated by CT of the chest in December and a decline in her CEA. She has difficulty tolerating her chemotherapy with severe nausea and vomiting and excessive fatigue of 5-7 days' duration. She has frequent ER visits for hydration.  She has done well with dose reduction in oxaliplatin and extending her anti-emetic regimen. She is recently on a vacation and has no complaints. She is due for her next cycle today. We will continue to monitor her CEA levels. She may need reimaging in the future and I have discussed this with her as well. For now plan as detailed above. I have recommended follow-up again next week to reassess tolerance &  management.  All questions were answered. The patient knows to call the clinic with any problems, questions or concerns. This note was electronically signed.    Molli Hazard, MD 12/03/2014 1:22 PM

## 2014-12-04 ENCOUNTER — Inpatient Hospital Stay (HOSPITAL_COMMUNITY)

## 2014-12-04 ENCOUNTER — Ambulatory Visit (HOSPITAL_COMMUNITY): Admitting: Oncology

## 2014-12-04 NOTE — Assessment & Plan Note (Signed)
Stage IV CRC on FOLFOX + Avastin therapy, previously diagnosed and treated at [facility] until she transferred her oncology care to [facility] in March 2016.  She is on Xarelto anticoagulation  Labs today: CBC diff, CMET, UA.  Return in 2 weeks for follow-up.

## 2014-12-04 NOTE — Progress Notes (Deleted)
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 female returns for followup of Stage IV CRC.    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   02/20/2014 Imaging CT of abdomen and pelvis on 02/20/2014 at [facility] showing 3.6 cm apple core lesion in the proximal sigmoid colon 11 mm hypoenhancing lesion in the medial left hepatic dome, 4.1 cm right lower lobe mass and adjacent right lower lobe nodule   04/17/2014 Imaging Right upper extremity DVT 04/17/2014 paired brachial veins, axillary vein, and peripheral aspect of the right subclavian vein   07/06/2014 Imaging CT chest 07/06/2014 with bilateral pulmonary nodules and masses, RUL, lateral RUL, posterior LUL, lobulated nodule in the subpleural RLL   08/18/2014 Tumor Marker CEA 93.7 ng/ml   10/09/2014 Initial Diagnosis 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  The results are noted within this dictation.    She reports intermittent blood in stool.  She reports it is stable since last week.  Her last experience was over the weekend.  She denies blood in her stool on every BM.  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  Oncologically, she denies any specific complaints otherwise and ROS questioning is negative.   Past Medical History  Diagnosis Date  . Hypertension   . Depression   . Pulmonary nodules 01/30/14    CTA CHEST  . Lymphadenopathy, mediastinal 01/2014    CTA ANGIO .Marland KitchenJackson  . Morbid obesity   . Diabetes mellitus, type II   . 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     is allergic to asa.  [name] does not currently have medications on file.  Past Surgical History  Procedure Laterality Date  . Cholecystectomy    . Video bronchoscopy with endobronchial ultrasound N/A 02/06/2014    Procedure: VIDEO BRONCHOSCOPY WITH ENDOBRONCHIAL ULTRASOUND,bronchial biopsies , node sampling;  Surgeon: Melrose Nakayama, MD;  Location: [place];  Service: Thoracic;  Laterality: N/A;  . Tubal ligation      Denies any headaches, dizziness, double vision, fevers, chills, night sweats, nausea, vomiting, diarrhea, constipation, chest pain, heart palpitations, shortness of breath, blood in stool, black tarry stool, urinary pain, urinary burning, urinary frequency, hematuria.   PHYSICAL EXAMINATION  ECOG PERFORMANCE STATUS: 0 - Asymptomatic  There were no vitals filed for this visit.  GENERAL:alert, no distress, well nourished, well developed, comfortable, cooperative, obese and smiling, accompanied by daughter. SKIN: skin color, texture, turgor are normal, no rashes or significant lesions HEAD: Normocephalic, No masses, lesions, tenderness or abnormalities EYES: normal, PERRLA, EOMI, Conjunctiva are pink and non-injected EARS: External ears normal OROPHARYNX:lips, buccal mucosa, and tongue normal and mucous membranes are moist  NECK: supple, no adenopathy, thyroid normal size, non-tender, without nodularity, no stridor, non-tender, trachea midline LYMPH:  no palpable lymphadenopathy, no hepatosplenomegaly BREAST:not examined LUNGS: clear to auscultation  HEART: regular rate & rhythm, no murmurs, no gallops, S1 normal and S2 normal  ABDOMEN:abdomen soft, non-tender, obese and normal bowel sounds BACK: Back symmetric, no curvature. EXTREMITIES:less then 2 second capillary refill, no joint  deformities, effusion, or inflammation, no skin discoloration, no cyanosis  NEURO: alert & oriented x 3 with fluent speech, no focal motor/sensory deficits, gait normal   LABORATORY DATA: CBC    Component Value Date/Time   WBC 9.9 11/16/2014 1214   RBC 4.36 11/16/2014 1214   RBC 4.36 11/16/2014 1214   HGB 13.3 11/16/2014 1214   HCT 40.5 11/16/2014 1214   PLT 251 11/16/2014 1214   MCV 92.9 11/16/2014 1214   MCH 30.5 11/16/2014 1214   MCHC 32.8 11/16/2014 1214   RDW 14.7 11/16/2014 1214   LYMPHSABS 3.0 11/16/2014 1214   MONOABS 0.8 11/16/2014 1214   EOSABS 0.1 11/16/2014 1214   BASOSABS 0.0 11/16/2014 1214      Chemistry      Component Value Date/Time   NA 135 11/16/2014 1214   K 4.0 11/16/2014 1214   CL 105 11/16/2014 1214   CO2 24 11/16/2014 1214   BUN 10 11/16/2014 1214   CREATININE 0.75 11/16/2014 1214      Component Value Date/Time   CALCIUM 9.1 11/16/2014 1214   ALKPHOS 82 11/16/2014 1214   AST 28 11/16/2014 1214   ALT 25 11/16/2014 1214   BILITOT 0.5 11/16/2014 1214     Lab Results  Component Value Date   CEA 148.3* 11/16/2014      ASSESSMENT AND PLAN:  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  She is on Xarelto anticoagulation  Labs today: CBC diff, CMET, UA.  Return in 2 weeks for follow-up.    THERAPY PLAN:  Continue with treatment as planned.  All questions were answered. The patient knows to call the clinic with any problems, questions or concerns. We can certainly see the patient much sooner if necessary.  Patient and plan discussed with Dr. Ancil Linsey and she is in agreement with the aforementioned.   This note is electronically signed by: Robynn Pane 12/04/2014 8:03 AM

## 2014-12-06 ENCOUNTER — Encounter (HOSPITAL_COMMUNITY)

## 2014-12-18 ENCOUNTER — Encounter (HOSPITAL_COMMUNITY): Payer: Self-pay | Admitting: Hematology & Oncology

## 2014-12-18 ENCOUNTER — Encounter (HOSPITAL_COMMUNITY): Attending: Hematology & Oncology

## 2014-12-18 ENCOUNTER — Other Ambulatory Visit (HOSPITAL_COMMUNITY): Payer: Self-pay | Admitting: Oncology

## 2014-12-18 ENCOUNTER — Encounter (HOSPITAL_BASED_OUTPATIENT_CLINIC_OR_DEPARTMENT_OTHER): Admitting: Hematology & Oncology

## 2014-12-18 VITALS — BP 153/94 | HR 18 | Temp 97.9°F | Resp 20

## 2014-12-18 VITALS — BP 133/88 | HR 80 | Temp 98.6°F | Resp 16 | Wt 249.6 lb

## 2014-12-18 DIAGNOSIS — C187 Malignant neoplasm of sigmoid colon: Secondary | ICD-10-CM | POA: Diagnosis not present

## 2014-12-18 DIAGNOSIS — C7800 Secondary malignant neoplasm of unspecified lung: Secondary | ICD-10-CM

## 2014-12-18 DIAGNOSIS — C189 Malignant neoplasm of colon, unspecified: Secondary | ICD-10-CM

## 2014-12-18 DIAGNOSIS — R112 Nausea with vomiting, unspecified: Secondary | ICD-10-CM

## 2014-12-18 DIAGNOSIS — I82621 Acute embolism and thrombosis of deep veins of right upper extremity: Secondary | ICD-10-CM

## 2014-12-18 DIAGNOSIS — Z5112 Encounter for antineoplastic immunotherapy: Secondary | ICD-10-CM

## 2014-12-18 DIAGNOSIS — C787 Secondary malignant neoplasm of liver and intrahepatic bile duct: Secondary | ICD-10-CM | POA: Diagnosis not present

## 2014-12-18 DIAGNOSIS — Z5111 Encounter for antineoplastic chemotherapy: Secondary | ICD-10-CM

## 2014-12-18 DIAGNOSIS — Z7951 Long term (current) use of inhaled steroids: Secondary | ICD-10-CM

## 2014-12-18 LAB — CBC WITH DIFFERENTIAL/PLATELET
BASOS ABS: 0 10*3/uL (ref 0.0–0.1)
Basophils Relative: 0 % (ref 0–1)
Eosinophils Absolute: 0.2 10*3/uL (ref 0.0–0.7)
Eosinophils Relative: 2 % (ref 0–5)
HEMATOCRIT: 38.7 % (ref 36.0–46.0)
Hemoglobin: 12.8 g/dL (ref 12.0–15.0)
LYMPHS ABS: 3 10*3/uL (ref 0.7–4.0)
LYMPHS PCT: 31 % (ref 12–46)
MCH: 31.4 pg (ref 26.0–34.0)
MCHC: 33.1 g/dL (ref 30.0–36.0)
MCV: 95.1 fL (ref 78.0–100.0)
MONO ABS: 0.9 10*3/uL (ref 0.1–1.0)
MONOS PCT: 9 % (ref 3–12)
Neutro Abs: 5.7 10*3/uL (ref 1.7–7.7)
Neutrophils Relative %: 58 % (ref 43–77)
Platelets: 247 10*3/uL (ref 150–400)
RBC: 4.07 MIL/uL (ref 3.87–5.11)
RDW: 15.3 % (ref 11.5–15.5)
WBC: 9.9 10*3/uL (ref 4.0–10.5)

## 2014-12-18 LAB — URINALYSIS, DIPSTICK ONLY
Bilirubin Urine: NEGATIVE
Glucose, UA: 100 mg/dL — AB
Ketones, ur: NEGATIVE mg/dL
LEUKOCYTES UA: NEGATIVE
Nitrite: NEGATIVE
PH: 5.5 (ref 5.0–8.0)
Protein, ur: NEGATIVE mg/dL
Specific Gravity, Urine: 1.025 (ref 1.005–1.030)
Urobilinogen, UA: 0.2 mg/dL (ref 0.0–1.0)

## 2014-12-18 LAB — COMPREHENSIVE METABOLIC PANEL
ALBUMIN: 3.7 g/dL (ref 3.5–5.0)
ALT: 22 U/L (ref 14–54)
ANION GAP: 10 (ref 5–15)
AST: 22 U/L (ref 15–41)
Alkaline Phosphatase: 76 U/L (ref 38–126)
BUN: 13 mg/dL (ref 6–20)
CALCIUM: 9.4 mg/dL (ref 8.9–10.3)
CHLORIDE: 103 mmol/L (ref 101–111)
CO2: 25 mmol/L (ref 22–32)
Creatinine, Ser: 0.73 mg/dL (ref 0.44–1.00)
GFR calc Af Amer: >60 mL/min (ref >60)
GFR calc non Af Amer: >60 mL/min (ref >60)
Glucose, Bld: 146 mg/dL — ABNORMAL HIGH (ref 65–99)
Potassium: 3.9 mmol/L (ref 3.5–5.1)
Sodium: 138 mmol/L (ref 135–145)
Total Bilirubin: 0.5 mg/dL (ref 0.3–1.2)
Total Protein: 8 g/dL (ref 6.5–8.1)

## 2014-12-18 MED ORDER — LORAZEPAM 2 MG/ML IJ SOLN
1.0000 mg | Freq: Once | INTRAMUSCULAR | Status: AC
  Administered 2014-12-18: 1 mg via INTRAVENOUS
  Filled 2014-12-18: qty 1

## 2014-12-18 MED ORDER — OXYCODONE-ACETAMINOPHEN 5-325 MG PO TABS: 1.0000 | ORAL_TABLET | ORAL | Status: DC | PRN

## 2014-12-18 MED ORDER — SODIUM CHLORIDE 0.9 % IV SOLN
Freq: Once | INTRAVENOUS | Status: AC
  Administered 2014-12-18: 10:00:00 via INTRAVENOUS

## 2014-12-18 MED ORDER — OXALIPLATIN CHEMO INJECTION 100 MG/20ML
68.0000 mg/m2 | Freq: Once | INTRAVENOUS | Status: AC
  Administered 2014-12-18: 150 mg via INTRAVENOUS
  Filled 2014-12-18: qty 30

## 2014-12-18 MED ORDER — LEUCOVORIN CALCIUM INJECTION 350 MG
400.0000 mg/m2 | Freq: Once | INTRAMUSCULAR | Status: AC
Start: 2014-12-18 — End: 2014-12-18
  Administered 2014-12-18: 896 mg via INTRAVENOUS
  Filled 2014-12-18: qty 44.8

## 2014-12-18 MED ORDER — DEXTROSE 5 % IV SOLN
Freq: Once | INTRAVENOUS | Status: AC
  Administered 2014-12-18: 10:00:00 via INTRAVENOUS

## 2014-12-18 MED ORDER — SODIUM CHLORIDE 0.9 % IV SOLN
5.0000 mg/kg | Freq: Once | INTRAVENOUS | Status: AC
  Administered 2014-12-18: 550 mg via INTRAVENOUS
  Filled 2014-12-18: qty 16

## 2014-12-18 MED ORDER — SODIUM CHLORIDE 0.9 % IV SOLN: 12.0000 mg | Freq: Once | INTRAVENOUS | Status: DC

## 2014-12-18 MED ORDER — PANTOPRAZOLE SODIUM 40 MG PO TBEC: 40.0000 mg | DELAYED_RELEASE_TABLET | Freq: Every day | ORAL | Status: DC

## 2014-12-18 MED ORDER — RIVAROXABAN 20 MG PO TABS: 20.0000 mg | ORAL_TABLET | Freq: Every day | ORAL | Status: DC

## 2014-12-18 MED ORDER — FLUOROURACIL CHEMO INJECTION 5 GM/100ML
2400.0000 mg/m2 | INTRAVENOUS | Status: DC
  Administered 2014-12-18: 5400 mg via INTRAVENOUS
  Filled 2014-12-18: qty 108

## 2014-12-18 MED ORDER — SODIUM CHLORIDE 0.9 % IJ SOLN
10.0000 mL | INTRAMUSCULAR | Status: DC | PRN
  Administered 2014-12-18: 10 mL
  Filled 2014-12-18: qty 10

## 2014-12-18 MED ORDER — PALONOSETRON HCL INJECTION 0.25 MG/5ML
0.2500 mg | Freq: Once | INTRAVENOUS | Status: AC
  Administered 2014-12-18: 0.25 mg via INTRAVENOUS
  Filled 2014-12-18: qty 5

## 2014-12-18 MED ORDER — HEPARIN SOD (PORK) LOCK FLUSH 100 UNIT/ML IV SOLN: 500.0000 [IU] | Freq: Once | INTRAVENOUS | Status: DC | PRN

## 2014-12-18 MED ORDER — SODIUM CHLORIDE 0.9 % IV SOLN
Freq: Once | INTRAVENOUS | Status: AC
  Administered 2014-12-18: 10:00:00 via INTRAVENOUS
  Filled 2014-12-18: qty 5

## 2014-12-18 NOTE — Progress Notes (Signed)
[place] Progress Note  Patient Care Team: [name], [place] as PCP - General Melrose Nakayama, MD as Consulting Physician (Cardiothoracic Surgery)   CHIEF COMPLAINTS/PURPOSE OF CONSULTATION:  Stage IV CRC CT of abdomen and pelvis on 02/20/2014 at [facility] showing 3.6 cm apple core lesion in the proximal sigmoid colon 11 mm hypoenhancing lesion in the medial left hepatic dome, 4.1 cm right lower lobe mass and adjacent right lower lobe nodule CEA on 08/18/2014 of 93.7 ng/ml Right upper extremity DVT 04/17/2014 paired brachial veins, axillary vein, and peripheral aspect of the right subclavian vein CT chest 07/06/2014 with bilateral pulmonary nodules and masses, RUL, lateral RUL, posterior LUL, lobulated nodule in the subpleural RLL,     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   02/20/2014 Imaging CT of abdomen and pelvis on 02/20/2014 at [facility] showing 3.6 cm apple core lesion in the proximal sigmoid colon 11 mm hypoenhancing lesion in the medial left hepatic dome, 4.1 cm right lower lobe mass and adjacent right lower lobe nodule   04/17/2014 Imaging Right upper extremity DVT 04/17/2014 paired brachial veins, axillary vein, and peripheral aspect of the right subclavian vein   07/06/2014 Imaging CT chest 07/06/2014 with bilateral pulmonary nodules and masses, RUL, lateral RUL, posterior LUL, lobulated nodule in the subpleural RLL   08/18/2014 Tumor Marker CEA 93.7 ng/ml   10/09/2014 Initial Diagnosis 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 female is here because of stage IV colon cancer. We dose reduced her oxaliplatin secondary to severe nausea and vomiting. She also had severe fatigue affecting her ADLs for greater than one week after each cycle.   She's dealing with a recent unexpected death in the family. Also, a friend of hers  had breast cancer that went to her liver and she died recently. She has good days and bad days, she feels as though her bad days are the ones leading up to coming in for treatment, possibly just anxiety.  Her daughter says she is all over the place, always out doing things, she feels as though she needs more naps than her mother. She has occasional tingling in her fingers and toes, not every day. Her appetite is good and her mood is up. She had a recent birthday. Her job wants her to return to work in August. She feels good about this but was curious about restrictions.  She would like to consider retrying Xeloda instead of the 5-FU pump. She feels this will be much easier on her when she returns to work. She had difficulties with nausea in the past on XELODA.  MEDICAL HISTORY:  Past Medical History  Diagnosis Date  . Hypertension   . Depression   . Pulmonary nodules 01/30/14    CTA CHEST  . Lymphadenopathy, mediastinal 01/2014    [place]  . Morbid obesity   . Diabetes mellitus, type II   . Headache(784.0)     SURGICAL HISTORY: Past Surgical History  Procedure Laterality Date  . Cholecystectomy    . Video bronchoscopy with endobronchial ultrasound N/A 02/06/2014    Procedure: VIDEO BRONCHOSCOPY WITH ENDOBRONCHIAL ULTRASOUND,bronchial biopsies , node sampling;  Surgeon: Melrose Nakayama, MD;  Location: [place];  Service: Thoracic;  Laterality: N/A;  . Tubal ligation      SOCIAL HISTORY: History   Social History  .  Marital Status: Married    Spouse Name: N/A  . Number of Children: N/A  . Years of Education: N/A   Occupational History  . Not on file.   Social History Main Topics  . Smoking status: Former Smoker -- 0.50 packs/day for 7 years    Types: Cigarettes    Quit date: 02/03/1989  . Smokeless tobacco: Never Used  . Alcohol Use: Yes     Comment: occasional  . Drug Use: No  . Sexual Activity: Not on file   Other Topics Concern  . Not on  file   Social History Narrative  She is in a relationship. 3 children. Aged 32,35,20.  She has no grandchildren. She smoked in high school but quit after graduation. No ETOH. 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  She is originally from [place].  FAMILY HISTORY: Family History  Problem Relation Age of Onset  . Cancer Mother     UTERINE  . Cancer Father     LEUKEMIA  . Crohn's disease Son    indicated that her mother is alive. She indicated that her father is deceased. She indicated that her son is alive.   Father is deceased at age 68 from leukemia, mother is alive at 59 with a history of uterine cancer.  She has one sister how is healthy and one brother who is healthy. He works as a Engineer, structural.   ALLERGIES:  is allergic to asa.  MEDICATIONS:  Current Outpatient Prescriptions  Medication Sig Dispense Refill  . aspirin-acetaminophen-caffeine (EXCEDRIN MIGRAINE) 250-250-65 MG per tablet Take 2 tablets by mouth every 6 (six) hours as needed for headache.    . fluorouracil CALGB 09735 in sodium chloride 0.9 % 150 mL Inject into the vein. To infuse over 46 hours every 14 days    . LEUCOVORIN CALCIUM IV Inject into the vein every 14 (fourteen) days.    Marland Kitchen lidocaine-prilocaine (EMLA) cream Apply a quarter size amount to port site 1 hour prior to chemo. Do not rub in. Cover with plastic wrap. 30 g 3  . lisinopril (PRINIVIL,ZESTRIL) 10 MG tablet Take 1 tablet (10 mg total) by mouth daily. 30 tablet 6  . metFORMIN (GLUCOPHAGE) 500 MG tablet Take 500 mg by mouth daily with breakfast.     . Multiple Vitamin (MULTIVITAMIN) capsule Take 1 capsule by mouth daily.    . ondansetron (ZOFRAN) 8 MG tablet Take 1 tablet (8 mg total) by mouth every 8 (eight) hours as needed for nausea or vomiting. 30 tablet 2  . OXALIPLATIN IV Inject into the vein every 14 (fourteen) days.    Marland Kitchen oxyCODONE-acetaminophen (PERCOCET/ROXICET) 5-325 MG per tablet Take 1 tablet by  mouth every 4 (four) hours as needed for severe pain. 60 tablet 0  . pantoprazole (PROTONIX) 40 MG tablet Take 1 tablet (40 mg total) by mouth daily. 30 tablet 3  . rivaroxaban (XARELTO) 20 MG TABS tablet Take 1 tablet (20 mg total) by mouth daily with supper. 30 tablet 3  . Diphenhyd-Hydrocort-Nystatin (FIRST-DUKES MOUTHWASH) SUSP Use as directed 5 mLs in the mouth or throat 4 (four) times daily as needed. (Patient not taking: Reported on 12/18/2014) 300 mL 2  . prochlorperazine (COMPAZINE) 10 MG tablet Take 1 tablet (10 mg total) by mouth every 6 (six) hours as needed (Nausea or vomiting). (Patient not taking: Reported on 12/18/2014) 30 tablet 2  . promethazine (PHENERGAN) 25 MG suppository Place 1 suppository (25 mg  total) rectally every 6 (six) hours as needed for nausea or vomiting. (Patient not taking: Reported on 12/18/2014) 12 each 0   No current facility-administered medications for this visit.   Facility-Administered Medications Ordered in Other Visits  Medication Dose Route Frequency Provider Last Rate Last Dose  . fluorouracil (ADRUCIL) 5,400 mg in sodium chloride 0.9 % 142 mL chemo infusion  2,400 mg/m2 (Treatment Plan Actual) Intravenous 1 day or 1 dose Patrici Ranks, MD   5,400 mg at 12/18/14 1316  . heparin lock flush 100 unit/mL  500 Units Intracatheter Once PRN Patrici Ranks, MD      . sodium chloride 0.9 % injection 10 mL  10 mL Intracatheter PRN Patrici Ranks, MD   10 mL at 12/18/14 1006    Review of Systems  Constitutional: Negative for fever, chills, weight loss and malaise/fatigue.  HENT: Negative for congestion, hearing loss, nosebleeds, sore throat and tinnitus.   Eyes: Negative for blurred vision, double vision, pain and discharge.  Respiratory: Negative for cough, hemoptysis, sputum production, shortness of breath and wheezing.   Cardiovascular: Negative for chest pain, palpitations, claudication, leg swelling and PND.  Gastrointestinal: Negative for heartburn,  nausea, vomiting, abdominal pain, diarrhea, constipation, blood in stool and melena.  Genitourinary: Negative for dysuria, urgency, frequency and hematuria.  Musculoskeletal: Negative for myalgias, joint pain and falls.  Skin: Negative for itching and rash.  Neurological: Negative for dizziness, tingling, tremors, sensory change, speech change, focal weakness, seizures, loss of consciousness, weakness and headaches.  Endo/Heme/Allergies: Does not bruise/bleed easily.  Psychiatric/Behavioral: Negative for depression, suicidal ideas, memory loss and substance abuse. The patient is not nervous/anxious and does not have insomnia.     PHYSICAL EXAMINATION: ECOG PERFORMANCE STATUS: 1 - Symptomatic but completely ambulatory  Filed Vitals:   12/18/14 0822  BP: 133/88  Pulse: 80  Temp: 98.6 F (37 C)  Resp: 16   Filed Weights   12/18/14 0822  Weight: 249 lb 9.6 oz (113.218 kg)     Physical Exam  Constitutional: She is oriented to person, place, and time and well-developed, well-nourished, and in no distress.  HENT:  Head: Normocephalic and atraumatic.  Nose: Nose normal.  Mouth/Throat: Oropharynx is clear and moist. No oropharyngeal exudate.  Eyes: Conjunctivae and EOM are normal. Pupils are equal, round, and reactive to light. Right eye exhibits no discharge. Left eye exhibits no discharge. No scleral icterus.  Neck: Normal range of motion. Neck supple. No tracheal deviation present. No thyromegaly present.  Cardiovascular: Normal rate, regular rhythm and normal heart sounds.  Exam reveals no gallop and no friction rub.   No murmur heard. Pulmonary/Chest: Effort normal and breath sounds normal. She has no wheezes. She has no rales.  Abdominal: Soft. Bowel sounds are normal. She exhibits no distension and no mass. There is no tenderness. There is no rebound and no guarding.  Musculoskeletal: Normal range of motion. She exhibits no edema.  Lymphadenopathy:    She has no cervical  adenopathy.  Neurological: She is alert and oriented to person, place, and time. She has normal reflexes. No cranial nerve deficit. Gait normal. Coordination normal.  Skin: Skin is warm and dry. No rash noted.  Psychiatric: Mood, memory, affect and judgment normal.  Nursing note and vitals reviewed.    LABORATORY DATA:  I have reviewed the data as listed CBC    Component Value Date/Time   WBC 9.9 12/18/2014 0854   RBC 4.07 12/18/2014 0854   RBC 4.36 11/16/2014 1214  HGB 12.8 12/18/2014 0854   HCT 38.7 12/18/2014 0854   PLT 247 12/18/2014 0854   MCV 95.1 12/18/2014 0854   MCH 31.4 12/18/2014 0854   MCHC 33.1 12/18/2014 0854   RDW 15.3 12/18/2014 0854   LYMPHSABS 3.0 12/18/2014 0854   MONOABS 0.9 12/18/2014 0854   EOSABS 0.2 12/18/2014 0854   BASOSABS 0.0 12/18/2014 0854    CMP     Component Value Date/Time   NA 138 12/18/2014 0854   K 3.9 12/18/2014 0854   CL 103 12/18/2014 0854   CO2 25 12/18/2014 0854   GLUCOSE 146* 12/18/2014 0854   BUN 13 12/18/2014 0854   CREATININE 0.73 12/18/2014 0854   CALCIUM 9.4 12/18/2014 0854   PROT 8.0 12/18/2014 0854   ALBUMIN 3.7 12/18/2014 0854   AST 22 12/18/2014 0854   ALT 22 12/18/2014 0854   ALKPHOS 76 12/18/2014 0854   BILITOT 0.5 12/18/2014 0854   GFRNONAA >60 12/18/2014 0854   GFRAA >60 12/18/2014 0854   CEA is pending  ASSESSMENT & PLAN:  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  She has done markedly better with dose reduction in her oxaliplatin and changing her anti-emetics. She has not required any IV hydration nor visits to the ED. She is active and has few limitations. I advised her if she desires to return to work that she certainly can.  CEA is pending today. If it is elevated higher than her last visit we will re-stage with repeat CT scans.  She has also unfortunately missed 2 treatments because of a vacation and recent death in her family.  Refill Protonix. Refill oxycodone. Refill Xarelto.  All questions were answered. The patient knows to call the clinic with any problems, questions or concerns. This note was electronically signed.    This document serves as a record of services personally performed by Ancil Linsey, MD. It was created on her behalf by Arlyce Harman, a trained medical scribe. The creation of this record is based on the scribe's personal observations and the provider's statements to them. This document has been checked and approved by the attending provider.  I have reviewed the above documentation for accuracy and completeness, and I agree with the above.  Molli Hazard, MD 12/18/2014 4:38 PM

## 2014-12-18 NOTE — Patient Instructions (Signed)
[facility] Discharge Instructions for Patients Receiving Chemotherapy  Today you received the following chemotherapy agents folfox with avastin Please follow up as scheduled Call the clinic if you have any questions or concerns  To help prevent nausea and vomiting after your treatment, we encourage you to take your nausea medication    If you develop nausea and vomiting, or diarrhea that is not controlled by your medication, call the clinic.  The clinic phone number is [phone]. Office hours are Monday-Friday 8:30am-5:00pm.  BELOW ARE SYMPTOMS THAT SHOULD BE REPORTED IMMEDIATELY:  *FEVER GREATER THAN 101.0 F  *CHILLS WITH OR WITHOUT FEVER  NAUSEA AND VOMITING THAT IS NOT CONTROLLED WITH YOUR NAUSEA MEDICATION  *UNUSUAL SHORTNESS OF BREATH  *UNUSUAL BRUISING OR BLEEDING  TENDERNESS IN MOUTH AND THROAT WITH OR WITHOUT PRESENCE OF ULCERS  *URINARY PROBLEMS  *BOWEL PROBLEMS  UNUSUAL RASH Items with * indicate a potential emergency and should be followed up as soon as possible. If you have an emergency after office hours please contact your primary care physician or go to the nearest emergency department.  Please call the clinic during office hours if you have any questions or concerns.   You may also contact the Patient Navigator at [phone] should you have any questions or need assistance in obtaining follow up care. _____________________________________________________________________ Have you asked about our STAR program?    STAR stands for Survivorship Training and Rehabilitation, and this is a nationally recognized cancer care program that focuses on survivorship and rehabilitation.  Cancer and cancer treatments may cause problems, such as, pain, making you feel tired and keeping you from doing the things that you need or want to do. Cancer rehabilitation can help. Our goal is to reduce these troubling effects and help you have the best quality of  life possible.  You may receive a survey from a nurse that asks questions about your current state of health.  Based on the survey results, all eligible patients will be referred to the [facility] program for an evaluation so we can better serve you! A frequently asked questions sheet is available upon request.

## 2014-12-18 NOTE — Patient Instructions (Signed)
[place] at [facility] Discharge Instructions  RECOMMENDATIONS MADE BY THE CONSULTANT AND ANY TEST RESULTS WILL BE SENT TO YOUR REFERRING PHYSICIAN.  Exam and discussion by Dr. Whitney Muse. Refills for:  Protonix, Xarelto and Pecocet. No changes in therapy at the present. Report fevers, uncontrolled nausea, vomiting, or other concerns.  Follow-up as scheduled.  Thank you for choosing [place] at [facility] to provide your oncology and hematology care.  To afford each patient quality time with our provider, please arrive at least 15 minutes before your scheduled appointment time.    You need to re-schedule your appointment should you arrive 10 or more minutes late.  We strive to give you quality time with our providers, and arriving late affects you and other patients whose appointments are after yours.  Also, if you no show three or more times for appointments you may be dismissed from the clinic at the providers discretion.     Again, thank you for choosing [facility].  Our hope is that these requests will decrease the amount of time that you wait before being seen by our physicians.       _____________________________________________________________  Should you have questions after your visit to [facility], please contact our office at [phone] between the hours of 8:30 a.m. and 4:30 p.m.  Voicemails left after 4:30 p.m. will not be returned until the following business day.  For prescription refill requests, have your pharmacy contact our office.

## 2014-12-18 NOTE — Progress Notes (Signed)
Erica Barker Tolerated chemotherapy well today Discharged ambulatory with pump

## 2014-12-19 LAB — CEA: CEA: 178.7 ng/mL — AB (ref 0.0–4.7)

## 2014-12-20 ENCOUNTER — Encounter (HOSPITAL_BASED_OUTPATIENT_CLINIC_OR_DEPARTMENT_OTHER)

## 2014-12-20 VITALS — BP 144/83 | HR 84 | Temp 97.7°F | Resp 20

## 2014-12-20 DIAGNOSIS — C787 Secondary malignant neoplasm of liver and intrahepatic bile duct: Secondary | ICD-10-CM | POA: Diagnosis not present

## 2014-12-20 DIAGNOSIS — C7801 Secondary malignant neoplasm of right lung: Secondary | ICD-10-CM | POA: Diagnosis not present

## 2014-12-20 DIAGNOSIS — C189 Malignant neoplasm of colon, unspecified: Secondary | ICD-10-CM

## 2014-12-20 DIAGNOSIS — C7800 Secondary malignant neoplasm of unspecified lung: Principal | ICD-10-CM

## 2014-12-20 MED ORDER — SODIUM CHLORIDE 0.9 % IJ SOLN
10.0000 mL | INTRAMUSCULAR | Status: DC | PRN
  Administered 2014-12-20: 10 mL
  Filled 2014-12-20: qty 10

## 2014-12-20 MED ORDER — PALONOSETRON HCL INJECTION 0.25 MG/5ML
INTRAVENOUS | Status: AC
  Filled 2014-12-20: qty 5

## 2014-12-20 MED ORDER — HEPARIN SOD (PORK) LOCK FLUSH 100 UNIT/ML IV SOLN
INTRAVENOUS | Status: AC
  Filled 2014-12-20: qty 5

## 2014-12-20 MED ORDER — PALONOSETRON HCL INJECTION 0.25 MG/5ML
0.2500 mg | Freq: Once | INTRAVENOUS | Status: AC
  Administered 2014-12-20: 0.25 mg via INTRAVENOUS

## 2014-12-20 MED ORDER — HEPARIN SOD (PORK) LOCK FLUSH 100 UNIT/ML IV SOLN
500.0000 [IU] | Freq: Once | INTRAVENOUS | Status: AC | PRN
  Administered 2014-12-20: 500 [IU]

## 2014-12-20 NOTE — Progress Notes (Signed)
D/C continuous infusion pump. Aloxi IV push given as ordered. Flushed port per protocol and de-accessed. No complaints voiced post chemo.

## 2014-12-20 NOTE — Patient Instructions (Signed)
[place] at [facility] Discharge Instructions  RECOMMENDATIONS MADE BY THE CONSULTANT AND ANY TEST RESULTS WILL BE SENT TO YOUR REFERRING PHYSICIAN.  Discontinued infusion pump. Aloxi given IV push as ordered. Return as scheduled.  Thank you for choosing [place] at [facility] to provide your oncology and hematology care.  To afford each patient quality time with our provider, please arrive at least 15 minutes before your scheduled appointment time.    You need to re-schedule your appointment should you arrive 10 or more minutes late.  We strive to give you quality time with our providers, and arriving late affects you and other patients whose appointments are after yours.  Also, if you no show three or more times for appointments you may be dismissed from the clinic at the providers discretion.     Again, thank you for choosing [facility].  Our hope is that these requests will decrease the amount of time that you wait before being seen by our physicians.       _____________________________________________________________  Should you have questions after your visit to [facility], please contact our office at [phone] between the hours of 8:30 a.m. and 4:30 p.m.  Voicemails left after 4:30 p.m. will not be returned until the following business day.  For prescription refill requests, have your pharmacy contact our office.

## 2015-01-01 ENCOUNTER — Encounter (HOSPITAL_BASED_OUTPATIENT_CLINIC_OR_DEPARTMENT_OTHER): Admitting: Hematology & Oncology

## 2015-01-01 ENCOUNTER — Encounter (HOSPITAL_BASED_OUTPATIENT_CLINIC_OR_DEPARTMENT_OTHER)

## 2015-01-01 ENCOUNTER — Encounter (HOSPITAL_COMMUNITY): Payer: Self-pay | Admitting: Hematology & Oncology

## 2015-01-01 VITALS — BP 156/100 | HR 77

## 2015-01-01 VITALS — BP 149/97 | HR 73 | Resp 16 | Wt 253.9 lb

## 2015-01-01 DIAGNOSIS — C189 Malignant neoplasm of colon, unspecified: Secondary | ICD-10-CM

## 2015-01-01 DIAGNOSIS — C787 Secondary malignant neoplasm of liver and intrahepatic bile duct: Secondary | ICD-10-CM

## 2015-01-01 DIAGNOSIS — Z5111 Encounter for antineoplastic chemotherapy: Secondary | ICD-10-CM

## 2015-01-01 DIAGNOSIS — Z5112 Encounter for antineoplastic immunotherapy: Secondary | ICD-10-CM

## 2015-01-01 DIAGNOSIS — C187 Malignant neoplasm of sigmoid colon: Secondary | ICD-10-CM

## 2015-01-01 DIAGNOSIS — C7800 Secondary malignant neoplasm of unspecified lung: Secondary | ICD-10-CM | POA: Diagnosis not present

## 2015-01-01 LAB — CBC WITH DIFFERENTIAL/PLATELET
BASOS ABS: 0 10*3/uL (ref 0.0–0.1)
Basophils Relative: 0 % (ref 0–1)
Eosinophils Absolute: 0.2 10*3/uL (ref 0.0–0.7)
Eosinophils Relative: 2 % (ref 0–5)
HCT: 37.7 % (ref 36.0–46.0)
Hemoglobin: 12.1 g/dL (ref 12.0–15.0)
LYMPHS ABS: 2.9 10*3/uL (ref 0.7–4.0)
LYMPHS PCT: 30 % (ref 12–46)
MCH: 30.6 pg (ref 26.0–34.0)
MCHC: 32.1 g/dL (ref 30.0–36.0)
MCV: 95.4 fL (ref 78.0–100.0)
Monocytes Absolute: 0.8 10*3/uL (ref 0.1–1.0)
Monocytes Relative: 9 % (ref 3–12)
NEUTROS PCT: 59 % (ref 43–77)
Neutro Abs: 5.6 10*3/uL (ref 1.7–7.7)
PLATELETS: 227 10*3/uL (ref 150–400)
RBC: 3.95 MIL/uL (ref 3.87–5.11)
RDW: 15.1 % (ref 11.5–15.5)
WBC: 9.4 10*3/uL (ref 4.0–10.5)

## 2015-01-01 LAB — COMPREHENSIVE METABOLIC PANEL
ALBUMIN: 3.5 g/dL (ref 3.5–5.0)
ALT: 24 U/L (ref 14–54)
ANION GAP: 10 (ref 5–15)
AST: 25 U/L (ref 15–41)
Alkaline Phosphatase: 75 U/L (ref 38–126)
BUN: 13 mg/dL (ref 6–20)
CALCIUM: 9 mg/dL (ref 8.9–10.3)
CO2: 25 mmol/L (ref 22–32)
Chloride: 107 mmol/L (ref 101–111)
Creatinine, Ser: 0.72 mg/dL (ref 0.44–1.00)
Glucose, Bld: 150 mg/dL — ABNORMAL HIGH (ref 65–99)
Potassium: 3.9 mmol/L (ref 3.5–5.1)
Sodium: 142 mmol/L (ref 135–145)
Total Bilirubin: 0.4 mg/dL (ref 0.3–1.2)
Total Protein: 7.7 g/dL (ref 6.5–8.1)

## 2015-01-01 LAB — URINALYSIS, DIPSTICK ONLY
BILIRUBIN URINE: NEGATIVE
GLUCOSE, UA: 250 mg/dL — AB
Ketones, ur: NEGATIVE mg/dL
Leukocytes, UA: NEGATIVE
Nitrite: NEGATIVE
PROTEIN: NEGATIVE mg/dL
Specific Gravity, Urine: >1.03 — ABNORMAL HIGH (ref 1.005–1.030)
Urobilinogen, UA: 0.2 mg/dL (ref 0.0–1.0)
pH: 5.5 (ref 5.0–8.0)

## 2015-01-01 MED ORDER — SODIUM CHLORIDE 0.9 % IV SOLN
2400.0000 mg/m2 | INTRAVENOUS | Status: DC
  Administered 2015-01-01: 5400 mg via INTRAVENOUS
  Filled 2015-01-01: qty 108

## 2015-01-01 MED ORDER — SODIUM CHLORIDE 0.9 % IV SOLN
Freq: Once | INTRAVENOUS | Status: AC
  Administered 2015-01-01: 10:00:00 via INTRAVENOUS
  Filled 2015-01-01: qty 5

## 2015-01-01 MED ORDER — SODIUM CHLORIDE 0.9 % IV SOLN
5.0000 mg/kg | Freq: Once | INTRAVENOUS | Status: AC
  Administered 2015-01-01: 550 mg via INTRAVENOUS
  Filled 2015-01-01: qty 16

## 2015-01-01 MED ORDER — DEXTROSE 5 % IV SOLN
68.0000 mg/m2 | Freq: Once | INTRAVENOUS | Status: AC
  Administered 2015-01-01: 150 mg via INTRAVENOUS
  Filled 2015-01-01: qty 30

## 2015-01-01 MED ORDER — SODIUM CHLORIDE 0.9 % IJ SOLN
10.0000 mL | INTRAMUSCULAR | Status: DC | PRN
  Administered 2015-01-01: 10 mL
  Filled 2015-01-01: qty 10

## 2015-01-01 MED ORDER — LEUCOVORIN CALCIUM INJECTION 350 MG
400.0000 mg/m2 | Freq: Once | INTRAVENOUS | Status: AC
  Administered 2015-01-01: 896 mg via INTRAVENOUS
  Filled 2015-01-01: qty 44.8

## 2015-01-01 MED ORDER — PALONOSETRON HCL INJECTION 0.25 MG/5ML
0.2500 mg | Freq: Once | INTRAVENOUS | Status: AC
  Administered 2015-01-01: 0.25 mg via INTRAVENOUS
  Filled 2015-01-01: qty 5

## 2015-01-01 MED ORDER — LORAZEPAM 2 MG/ML IJ SOLN
1.0000 mg | Freq: Once | INTRAMUSCULAR | Status: AC
  Administered 2015-01-01: 1 mg via INTRAVENOUS
  Filled 2015-01-01: qty 1

## 2015-01-01 MED ORDER — DEXTROSE 5 % IV SOLN
Freq: Once | INTRAVENOUS | Status: AC
  Administered 2015-01-01: 09:00:00 via INTRAVENOUS

## 2015-01-01 MED ORDER — SODIUM CHLORIDE 0.9 % IV SOLN: 12.0000 mg | Freq: Once | INTRAVENOUS | Status: DC

## 2015-01-01 MED ORDER — SODIUM CHLORIDE 0.9 % IV SOLN
Freq: Once | INTRAVENOUS | Status: AC
  Administered 2015-01-01: 11:00:00 via INTRAVENOUS

## 2015-01-01 NOTE — Patient Instructions (Signed)
[place] at [facility] Discharge Instructions  RECOMMENDATIONS MADE BY THE CONSULTANT AND ANY TEST RESULTS WILL BE SENT TO YOUR REFERRING PHYSICIAN.  Exam and discussion by Dr. Whitney Muse. No change in therapy at this time. Report fevers, chills, uncontrolled nausea or other concerns.  Follow-up in 2 weeks.  Thank you for choosing [place] at [facility] to provide your oncology and hematology care.  To afford each patient quality time with our provider, please arrive at least 15 minutes before your scheduled appointment time.    You need to re-schedule your appointment should you arrive 10 or more minutes late.  We strive to give you quality time with our providers, and arriving late affects you and other patients whose appointments are after yours.  Also, if you no show three or more times for appointments you may be dismissed from the clinic at the providers discretion.     Again, thank you for choosing [facility].  Our hope is that these requests will decrease the amount of time that you wait before being seen by our physicians.       _____________________________________________________________  Should you have questions after your visit to [facility], please contact our office at [phone] between the hours of 8:30 a.m. and 4:30 p.m.  Voicemails left after 4:30 p.m. will not be returned until the following business day.  For prescription refill requests, have your pharmacy contact our office.

## 2015-01-01 NOTE — Progress Notes (Signed)
BP rechecked prior to discharge and reading of 156/100. Patient reports that she took lisinopril 10 mg this am.  Tolerated treatment without problems

## 2015-01-01 NOTE — Progress Notes (Signed)
[facility] Progress Note  Patient Care Team: [name], [place] as PCP - General Melrose Nakayama, MD as Consulting Physician (Cardiothoracic Surgery)   CHIEF COMPLAINTS/PURPOSE OF CONSULTATION:  Stage IV CRC CT of abdomen and pelvis on 02/20/2014 at [facility] showing 3.6 cm apple core lesion in the proximal sigmoid colon 11 mm hypoenhancing lesion in the medial left hepatic dome, 4.1 cm right lower lobe mass and adjacent right lower lobe nodule CEA on 08/18/2014 of 93.7 ng/ml Right upper extremity DVT 04/17/2014 paired brachial veins, axillary vein, and peripheral aspect of the right subclavian vein CT chest 07/06/2014 with bilateral pulmonary nodules and masses, RUL, lateral RUL, posterior LUL, lobulated nodule in the subpleural RLL,     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   02/20/2014 Imaging CT of abdomen and pelvis on 02/20/2014 at [facility] showing 3.6 cm apple core lesion in the proximal sigmoid colon 11 mm hypoenhancing lesion in the medial left hepatic dome, 4.1 cm right lower lobe mass and adjacent right lower lobe nodule   04/17/2014 Imaging Right upper extremity DVT 04/17/2014 paired brachial veins, axillary vein, and peripheral aspect of the right subclavian vein   07/06/2014 Imaging CT chest 07/06/2014 with bilateral pulmonary nodules and masses, RUL, lateral RUL, posterior LUL, lobulated nodule in the subpleural RLL   08/18/2014 Tumor Marker CEA 93.7 ng/ml   10/09/2014 Initial Diagnosis 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 female is here because of stage IV colon cancer. We dose reduced her oxaliplatin secondary to severe nausea and vomiting. She also had severe fatigue affecting her ADLs for greater than one week after each cycle.   She is present today with her son and her son's friend and says that she is doing  well She says that she has been eating and getting out often. She needs no medication refills. Her bowels are doing well, no diarrhea and no constipation. Appetite is good. No nausea or vomiting. No neuropathic symptoms  MEDICAL HISTORY:  Past Medical History  Diagnosis Date  . Hypertension   . Depression   . Pulmonary nodules 01/30/14    CTA CHEST  . Lymphadenopathy, mediastinal 01/2014    CTA ANGIO .[place]  . Morbid obesity   . Diabetes mellitus, type II   . Headache(784.0)     SURGICAL HISTORY: Past Surgical History  Procedure Laterality Date  . Cholecystectomy    . Video bronchoscopy with endobronchial ultrasound N/A 02/06/2014    Procedure: VIDEO BRONCHOSCOPY WITH ENDOBRONCHIAL ULTRASOUND,bronchial biopsies , node sampling;  Surgeon: Melrose Nakayama, MD;  Location: [place];  Service: Thoracic;  Laterality: N/A;  . Tubal ligation      SOCIAL HISTORY: History   Social History  . Marital Status: Married    Spouse Name: N/A  . Number of Children: N/A  . Years of Education: N/A   Occupational History  . Not on file.   Social History Main Topics  . Smoking status: Former Smoker -- 0.50 packs/day for 7 years    Types: Cigarettes    Quit date: 02/03/1989  . Smokeless tobacco: Never Used  . Alcohol Use: Yes     Comment: occasional  . Drug Use: No  . Sexual Activity: Not on file   Other Topics Concern  . Not on file   Social History Narrative  She is in a  relationship. 3 children. Aged 32,35,20.  She has no grandchildren. She smoked in high school but quit after graduation. No ETOH. 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  She is originally from [place].  FAMILY HISTORY: Family History  Problem Relation Age of Onset  . Cancer Mother     UTERINE  . Cancer Father     LEUKEMIA  . Crohn's disease Son    indicated that her mother is alive. She indicated that her father is deceased. She indicated that her son  is alive.   Father is deceased at age 48 from leukemia, mother is alive at 51 with a history of uterine cancer.  She has one sister how is healthy and one brother who is healthy. He works as a Engineer, structural.   ALLERGIES:  is allergic to asa.  MEDICATIONS:  Current Outpatient Prescriptions  Medication Sig Dispense Refill  . aspirin-acetaminophen-caffeine (EXCEDRIN MIGRAINE) 250-250-65 MG per tablet Take 2 tablets by mouth every 6 (six) hours as needed for headache.    . Diphenhyd-Hydrocort-Nystatin (FIRST-DUKES MOUTHWASH) SUSP Use as directed 5 mLs in the mouth or throat 4 (four) times daily as needed. 300 mL 2  . fluorouracil CALGB 83151 in sodium chloride 0.9 % 150 mL Inject into the vein. To infuse over 46 hours every 14 days    . LEUCOVORIN CALCIUM IV Inject into the vein every 14 (fourteen) days.    Marland Kitchen lidocaine-prilocaine (EMLA) cream Apply a quarter size amount to port site 1 hour prior to chemo. Do not rub in. Cover with plastic wrap. 30 g 3  . lisinopril (PRINIVIL,ZESTRIL) 10 MG tablet Take 1 tablet (10 mg total) by mouth daily. 30 tablet 6  . metFORMIN (GLUCOPHAGE) 500 MG tablet Take 500 mg by mouth daily with breakfast.     . Multiple Vitamin (MULTIVITAMIN) capsule Take 1 capsule by mouth daily.    . ondansetron (ZOFRAN) 8 MG tablet Take 1 tablet (8 mg total) by mouth every 8 (eight) hours as needed for nausea or vomiting. 30 tablet 2  . OXALIPLATIN IV Inject into the vein every 14 (fourteen) days.    Marland Kitchen oxyCODONE-acetaminophen (PERCOCET/ROXICET) 5-325 MG per tablet Take 1 tablet by mouth every 4 (four) hours as needed for severe pain. 60 tablet 0  . promethazine (PHENERGAN) 25 MG suppository Place 1 suppository (25 mg total) rectally every 6 (six) hours as needed for nausea or vomiting. 12 each 0  . rivaroxaban (XARELTO) 20 MG TABS tablet Take 1 tablet (20 mg total) by mouth daily with supper. 30 tablet 3  . pantoprazole (PROTONIX) 40 MG tablet Take 1 tablet (40 mg total) by mouth  daily. 30 tablet 3  . prochlorperazine (COMPAZINE) 10 MG tablet Take 1 tablet (10 mg total) by mouth every 6 (six) hours as needed (Nausea or vomiting). (Patient not taking: Reported on 12/18/2014) 30 tablet 2   No current facility-administered medications for this visit.   Facility-Administered Medications Ordered in Other Visits  Medication Dose Route Frequency Provider Last Rate Last Dose  . sodium chloride 0.9 % injection 10 mL  10 mL Intracatheter PRN Patrici Ranks, MD        Review of Systems  Constitutional: Negative for fever, chills, weight loss and malaise/fatigue.  HENT: Negative for congestion, hearing loss, nosebleeds, sore throat and tinnitus.   Eyes: Negative for blurred vision, double vision, pain and discharge.  Respiratory: Negative for cough, hemoptysis, sputum production, shortness of  breath and wheezing.   Cardiovascular: Negative for chest pain, palpitations, claudication, leg swelling and PND.  Gastrointestinal: Negative for heartburn, nausea, vomiting, abdominal pain, diarrhea, constipation, blood in stool and melena.  Genitourinary: Negative for dysuria, urgency, frequency and hematuria.  Musculoskeletal: Negative for myalgias, joint pain and falls.  Skin: Negative for itching and rash.  Neurological: Negative for dizziness, tingling, tremors, sensory change, speech change, focal weakness, seizures, loss of consciousness, weakness and headaches.  Endo/Heme/Allergies: Does not bruise/bleed easily.  Psychiatric/Behavioral: Negative for depression, suicidal ideas, memory loss and substance abuse. The patient is not nervous/anxious and does not have insomnia.   14 point review of systems was performed and is negative except as detailed under history of present illness and above   PHYSICAL EXAMINATION: ECOG PERFORMANCE STATUS: 1 - Symptomatic but completely ambulatory  Filed Vitals:   01/01/15 0819  BP: 149/97  Pulse: 73  Resp: 16   Filed Weights   01/01/15  0819  Weight: 253 lb 14.4 oz (115.168 kg)    Physical Exam  Constitutional: She is oriented to person, place, and time and well-developed, well-nourished, and in no distress.  HENT:  Head: Normocephalic and atraumatic.  Nose: Nose normal.  Mouth/Throat: Oropharynx is clear and moist. No oropharyngeal exudate.  Eyes: Conjunctivae and EOM are normal. Pupils are equal, round, and reactive to light. Right eye exhibits no discharge. Left eye exhibits no discharge. No scleral icterus.  Neck: Normal range of motion. Neck supple. No tracheal deviation present. No thyromegaly present.  Cardiovascular: Normal rate, regular rhythm and normal heart sounds.  Exam reveals no gallop and no friction rub.   No murmur heard. Pulmonary/Chest: Effort normal and breath sounds normal. She has no wheezes. She has no rales.  Abdominal: Soft. Bowel sounds are normal. She exhibits no distension and no mass. There is no tenderness. There is no rebound and no guarding.  Musculoskeletal: Normal range of motion. She exhibits no edema.  Lymphadenopathy:    She has no cervical adenopathy.  Neurological: She is alert and oriented to person, place, and time. She has normal reflexes. No cranial nerve deficit. Gait normal. Coordination normal.  Skin: Skin is warm and dry. No rash noted.  Psychiatric: Mood, memory, affect and judgment normal.  Nursing note and vitals reviewed.   LABORATORY DATA:  I have reviewed the data as listed CBC    Component Value Date/Time   WBC 9.4 01/01/2015 0830   RBC 3.95 01/01/2015 0830   RBC 4.36 11/16/2014 1214   HGB 12.1 01/01/2015 0830   HCT 37.7 01/01/2015 0830   PLT 227 01/01/2015 0830   MCV 95.4 01/01/2015 0830   MCH 30.6 01/01/2015 0830   MCHC 32.1 01/01/2015 0830   RDW 15.1 01/01/2015 0830   LYMPHSABS 2.9 01/01/2015 0830   MONOABS 0.8 01/01/2015 0830   EOSABS 0.2 01/01/2015 0830   BASOSABS 0.0 01/01/2015 0830    CMP     Component Value Date/Time   NA 142 01/01/2015  0830   K 3.9 01/01/2015 0830   CL 107 01/01/2015 0830   CO2 25 01/01/2015 0830   GLUCOSE 150* 01/01/2015 0830   BUN 13 01/01/2015 0830   CREATININE 0.72 01/01/2015 0830   CALCIUM 9.0 01/01/2015 0830   PROT 7.7 01/01/2015 0830   ALBUMIN 3.5 01/01/2015 0830   AST 25 01/01/2015 0830   ALT 24 01/01/2015 0830   ALKPHOS 75 01/01/2015 0830   BILITOT 0.4 01/01/2015 0830   GFRNONAA >60 01/01/2015 0830   GFRAA >60  01/01/2015 0830   CEA is pending  ASSESSMENT & PLAN:  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  She has done markedly better with dose reduction in her oxaliplatin and changing her anti-emetics. She has not required any IV hydration nor visits to the ED. She is active and has few limitations. I advised her if she desires to return to work that she certainly can.  CEA is pending today. If it is elevated higher than her last visit we will re-stage with repeat CT scans. She has also unfortunately missed 2 treatments because of a vacation and recent death in her family.  She requires no refills today.  All questions were answered. The patient knows to call the clinic with any problems, questions or concerns. // This note was electronically signed.    This document serves as a record of services personally performed by Ancil Linsey, MD. It was created on her behalf by Janace Hoard, a trained medical scribe. The creation of this record is based on the scribe's personal observations and the provider's statements to them. This document has been checked and approved by the attending provider.  I have reviewed the above documentation for accuracy and completeness, and I agree with the above.  Kelby Fam. Whitney Muse, MD

## 2015-01-02 LAB — CEA: CEA: 155.9 ng/mL — AB (ref 0.0–4.7)

## 2015-01-03 ENCOUNTER — Encounter (HOSPITAL_BASED_OUTPATIENT_CLINIC_OR_DEPARTMENT_OTHER)

## 2015-01-03 VITALS — BP 134/87 | HR 79 | Temp 98.2°F | Resp 18

## 2015-01-03 DIAGNOSIS — C189 Malignant neoplasm of colon, unspecified: Secondary | ICD-10-CM

## 2015-01-03 DIAGNOSIS — C7800 Secondary malignant neoplasm of unspecified lung: Principal | ICD-10-CM

## 2015-01-03 DIAGNOSIS — Z452 Encounter for adjustment and management of vascular access device: Secondary | ICD-10-CM | POA: Diagnosis not present

## 2015-01-03 DIAGNOSIS — C187 Malignant neoplasm of sigmoid colon: Secondary | ICD-10-CM

## 2015-01-03 MED ORDER — HEPARIN SOD (PORK) LOCK FLUSH 100 UNIT/ML IV SOLN
INTRAVENOUS | Status: AC
  Filled 2015-01-03: qty 5

## 2015-01-03 MED ORDER — SODIUM CHLORIDE 0.9 % IJ SOLN
10.0000 mL | INTRAMUSCULAR | Status: DC | PRN
  Administered 2015-01-03: 10 mL
  Filled 2015-01-03: qty 10

## 2015-01-03 MED ORDER — HEPARIN SOD (PORK) LOCK FLUSH 100 UNIT/ML IV SOLN
500.0000 [IU] | Freq: Once | INTRAVENOUS | Status: AC | PRN
  Administered 2015-01-03: 500 [IU]

## 2015-01-03 NOTE — Progress Notes (Signed)
Velora Mediate presented for Portacath access and flush. Proper placement of portacath confirmed by CXR. Portacath located right chest wall. Good blood return present. Portacath flushed with 43m NS and 500U/578mHeparin and needle removed intact. Procedure without incident. Patient tolerated procedure well.

## 2015-01-05 NOTE — Progress Notes (Signed)
This encounter was created in error - please disregard.

## 2015-01-10 ENCOUNTER — Encounter: Payer: Self-pay | Admitting: *Deleted

## 2015-01-10 NOTE — Progress Notes (Signed)
[place] Clinical Social Work  Clinical Social Work was referred by Database administrator for assessment of psychosocial needs due to emotional support needs.  Clinical Social Worker contacted patient at home to offer support and assess for needs.  CSW left vm suggesting pt attend next Support Group and upcoming support programs, to return CSW call here at [facility] or at [facility]. CSW will follow accordingly and assist.   Clinical Social Work interventions: Resource education  Loren Racer, [place] Tuesdays 8:30-1pm Wednesdays 8:30-12pm  [phone]

## 2015-01-16 ENCOUNTER — Encounter (HOSPITAL_COMMUNITY): Payer: Self-pay | Admitting: Hematology & Oncology

## 2015-01-16 ENCOUNTER — Encounter (HOSPITAL_BASED_OUTPATIENT_CLINIC_OR_DEPARTMENT_OTHER): Admitting: Hematology & Oncology

## 2015-01-16 ENCOUNTER — Encounter (HOSPITAL_COMMUNITY): Attending: Hematology & Oncology

## 2015-01-16 VITALS — BP 152/100 | HR 66 | Temp 97.7°F | Resp 16

## 2015-01-16 VITALS — BP 144/100 | HR 77 | Temp 98.9°F | Resp 18 | Wt 252.2 lb

## 2015-01-16 DIAGNOSIS — C187 Malignant neoplasm of sigmoid colon: Secondary | ICD-10-CM | POA: Diagnosis not present

## 2015-01-16 DIAGNOSIS — C7801 Secondary malignant neoplasm of right lung: Secondary | ICD-10-CM

## 2015-01-16 DIAGNOSIS — Z5112 Encounter for antineoplastic immunotherapy: Secondary | ICD-10-CM

## 2015-01-16 DIAGNOSIS — C189 Malignant neoplasm of colon, unspecified: Secondary | ICD-10-CM | POA: Insufficient documentation

## 2015-01-16 DIAGNOSIS — C7800 Secondary malignant neoplasm of unspecified lung: Secondary | ICD-10-CM | POA: Insufficient documentation

## 2015-01-16 DIAGNOSIS — F419 Anxiety disorder, unspecified: Secondary | ICD-10-CM

## 2015-01-16 DIAGNOSIS — Z5111 Encounter for antineoplastic chemotherapy: Secondary | ICD-10-CM

## 2015-01-16 DIAGNOSIS — C787 Secondary malignant neoplasm of liver and intrahepatic bile duct: Secondary | ICD-10-CM | POA: Diagnosis not present

## 2015-01-16 DIAGNOSIS — I82621 Acute embolism and thrombosis of deep veins of right upper extremity: Secondary | ICD-10-CM

## 2015-01-16 DIAGNOSIS — Z139 Encounter for screening, unspecified: Secondary | ICD-10-CM

## 2015-01-16 LAB — URINALYSIS, DIPSTICK ONLY
Bilirubin Urine: NEGATIVE
Glucose, UA: 250 mg/dL — AB
KETONES UR: NEGATIVE mg/dL
LEUKOCYTES UA: NEGATIVE
NITRITE: NEGATIVE
PROTEIN: NEGATIVE mg/dL
Specific Gravity, Urine: 1.025 (ref 1.005–1.030)
Urobilinogen, UA: 0.2 mg/dL (ref 0.0–1.0)
pH: 6 (ref 5.0–8.0)

## 2015-01-16 LAB — CBC WITH DIFFERENTIAL/PLATELET
Basophils Absolute: 0 10*3/uL (ref 0.0–0.1)
Basophils Relative: 0 % (ref 0–1)
EOS PCT: 2 % (ref 0–5)
Eosinophils Absolute: 0.1 10*3/uL (ref 0.0–0.7)
HEMATOCRIT: 38.7 % (ref 36.0–46.0)
HEMOGLOBIN: 12.6 g/dL (ref 12.0–15.0)
LYMPHS ABS: 2.9 10*3/uL (ref 0.7–4.0)
LYMPHS PCT: 35 % (ref 12–46)
MCH: 31.1 pg (ref 26.0–34.0)
MCHC: 32.6 g/dL (ref 30.0–36.0)
MCV: 95.6 fL (ref 78.0–100.0)
MONO ABS: 0.9 10*3/uL (ref 0.1–1.0)
Monocytes Relative: 11 % (ref 3–12)
Neutro Abs: 4.3 10*3/uL (ref 1.7–7.7)
Neutrophils Relative %: 52 % (ref 43–77)
Platelets: 191 10*3/uL (ref 150–400)
RBC: 4.05 MIL/uL (ref 3.87–5.11)
RDW: 15.4 % (ref 11.5–15.5)
WBC: 8.2 10*3/uL (ref 4.0–10.5)

## 2015-01-16 LAB — COMPREHENSIVE METABOLIC PANEL
ALBUMIN: 3.5 g/dL (ref 3.5–5.0)
ALT: 18 U/L (ref 14–54)
AST: 18 U/L (ref 15–41)
Alkaline Phosphatase: 75 U/L (ref 38–126)
Anion gap: 7 (ref 5–15)
BUN: 14 mg/dL (ref 6–20)
CALCIUM: 8.9 mg/dL (ref 8.9–10.3)
CHLORIDE: 104 mmol/L (ref 101–111)
CO2: 26 mmol/L (ref 22–32)
Creatinine, Ser: 0.71 mg/dL (ref 0.44–1.00)
GFR calc non Af Amer: >60 mL/min (ref >60)
Glucose, Bld: 129 mg/dL — ABNORMAL HIGH (ref 65–99)
POTASSIUM: 3.9 mmol/L (ref 3.5–5.1)
Sodium: 137 mmol/L (ref 135–145)
Total Bilirubin: 0.5 mg/dL (ref 0.3–1.2)
Total Protein: 7.7 g/dL (ref 6.5–8.1)

## 2015-01-16 MED ORDER — PALONOSETRON HCL INJECTION 0.25 MG/5ML
0.2500 mg | Freq: Once | INTRAVENOUS | Status: AC
  Administered 2015-01-16: 0.25 mg via INTRAVENOUS

## 2015-01-16 MED ORDER — ESCITALOPRAM OXALATE 20 MG PO TABS: ORAL_TABLET | ORAL | Status: DC

## 2015-01-16 MED ORDER — SODIUM CHLORIDE 0.9 % IV SOLN
2400.0000 mg/m2 | INTRAVENOUS | Status: DC
  Administered 2015-01-16: 5400 mg via INTRAVENOUS
  Filled 2015-01-16: qty 108

## 2015-01-16 MED ORDER — LORAZEPAM 2 MG/ML IJ SOLN
1.0000 mg | Freq: Once | INTRAMUSCULAR | Status: AC
  Administered 2015-01-16: 1 mg via INTRAVENOUS
  Filled 2015-01-16: qty 1

## 2015-01-16 MED ORDER — SODIUM CHLORIDE 0.9 % IV SOLN
Freq: Once | INTRAVENOUS | Status: AC
  Administered 2015-01-16: 11:00:00 via INTRAVENOUS
  Filled 2015-01-16: qty 5

## 2015-01-16 MED ORDER — DEXTROSE 5 % IV SOLN
Freq: Once | INTRAVENOUS | Status: AC
Start: 2015-01-16 — End: 2015-01-16
  Administered 2015-01-16: 10:00:00 via INTRAVENOUS

## 2015-01-16 MED ORDER — OXALIPLATIN CHEMO INJECTION 100 MG/20ML
68.0000 mg/m2 | Freq: Once | INTRAVENOUS | Status: AC
  Administered 2015-01-16: 150 mg via INTRAVENOUS
  Filled 2015-01-16: qty 30

## 2015-01-16 MED ORDER — SODIUM CHLORIDE 0.9 % IJ SOLN: 10.0000 mL | INTRAMUSCULAR | Status: DC | PRN

## 2015-01-16 MED ORDER — SODIUM CHLORIDE 0.9 % IV SOLN
5.0000 mg/kg | Freq: Once | INTRAVENOUS | Status: AC
  Administered 2015-01-16: 550 mg via INTRAVENOUS
  Filled 2015-01-16: qty 16

## 2015-01-16 MED ORDER — PALONOSETRON HCL INJECTION 0.25 MG/5ML
INTRAVENOUS | Status: AC
  Filled 2015-01-16: qty 5

## 2015-01-16 MED ORDER — HEPARIN SOD (PORK) LOCK FLUSH 100 UNIT/ML IV SOLN: 500.0000 [IU] | Freq: Once | INTRAVENOUS | Status: DC | PRN

## 2015-01-16 MED ORDER — DEXTROSE 5 % IV SOLN
400.0000 mg/m2 | Freq: Once | INTRAVENOUS | Status: AC
  Administered 2015-01-16: 896 mg via INTRAVENOUS
  Filled 2015-01-16: qty 44.8

## 2015-01-16 MED ORDER — SODIUM CHLORIDE 0.9 % IV SOLN: 12.0000 mg | Freq: Once | INTRAVENOUS | Status: DC

## 2015-01-16 NOTE — Progress Notes (Signed)
[place] Progress Note  Patient Care Team: [name], [place] as PCP - General Melrose Nakayama, MD as Consulting Physician (Cardiothoracic Surgery)   CHIEF COMPLAINTS/PURPOSE OF CONSULTATION:  Stage IV CRC CT of abdomen and pelvis on 02/20/2014 at [facility] showing 3.6 cm apple core lesion in the proximal sigmoid colon 11 mm hypoenhancing lesion in the medial left hepatic dome, 4.1 cm right lower lobe mass and adjacent right lower lobe nodule CEA on 08/18/2014 of 93.7 ng/ml Right upper extremity DVT 04/17/2014 paired brachial veins, axillary vein, and peripheral aspect of the right subclavian vein CT chest 07/06/2014 with bilateral pulmonary nodules and masses, RUL, lateral RUL, posterior LUL, lobulated nodule in the subpleural RLL,     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   02/20/2014 Imaging CT of abdomen and pelvis on 02/20/2014 at [facility] showing 3.6 cm apple core lesion in the proximal sigmoid colon 11 mm hypoenhancing lesion in the medial left hepatic dome, 4.1 cm right lower lobe mass and adjacent right lower lobe nodule   04/17/2014 Imaging Right upper extremity DVT 04/17/2014 paired brachial veins, axillary vein, and peripheral aspect of the right subclavian vein   07/06/2014 Imaging CT chest 07/06/2014 with bilateral pulmonary nodules and masses, RUL, lateral RUL, posterior LUL, lobulated nodule in the subpleural RLL   08/18/2014 Tumor Marker CEA 93.7 ng/ml   10/09/2014 Initial Diagnosis 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 female is here because of stage IV colon cancer. We dose reduced her oxaliplatin secondary to severe nausea and vomiting. She also had severe fatigue affecting her ADLs for greater than one week after each cycle.    She is here today with 2 family members. She is crying upon entering the room, when  asked why she responded that she "isn't feeling it today". She is not currently on an anti depressant. She says she is normally a happy person, however she sometimes has periods of anxiety related to her illness. She feels okay and enjoys herself until she comes in for her appointments. She is agreeable to beginning an anti depressant regimen.  She has restless sleep for about 2 days after having her 'fanny pack removed'. Her energy begins to come back in that time. For those 2 days she doesn't do anything other than rest and she is too tired to do anything else. She tries to do a little more before resting each day. She doesn't go out in the days following treatment because she doesn't want to bring her pump. She feels as though she wants to go out and be more social in that time period but her "body won't let her".  She is interested in spreading her treatments out further to every 3 weeks instead of every 2. She wants to know if this is a possibility.  She doesn't currently attend yoga sessions. She is agreeable to trying the yoga sessions.  She would like a referral for a general physician and knows she needs to schedule a mammogram.    MEDICAL HISTORY:  Past Medical History  Diagnosis Date  . Hypertension   . Depression   . Pulmonary nodules 01/30/14    CTA CHEST  . Lymphadenopathy, mediastinal 01/2014    CTA ANGIO .[place] Kitchen[place] Witt  . Morbid obesity   . Diabetes mellitus, type II   . Headache(784.0)  SURGICAL HISTORY: Past Surgical History  Procedure Laterality Date  . Cholecystectomy    . Video bronchoscopy with endobronchial ultrasound N/A 02/06/2014    Procedure: VIDEO BRONCHOSCOPY WITH ENDOBRONCHIAL ULTRASOUND,bronchial biopsies , node sampling;  Surgeon: Melrose Nakayama, MD;  Location: [place];  Service: Thoracic;  Laterality: N/A;  . Tubal ligation      SOCIAL HISTORY: History   Social History  . Marital Status: Married    Spouse Name: N/A  . Number of  Children: N/A  . Years of Education: N/A   Occupational History  . Not on file.   Social History Main Topics  . Smoking status: Former Smoker -- 0.50 packs/day for 7 years    Types: Cigarettes    Quit date: 02/03/1989  . Smokeless tobacco: Never Used  . Alcohol Use: Yes     Comment: occasional  . Drug Use: No  . Sexual Activity: Not on file   Other Topics Concern  . Not on file   Social History Narrative  She is in a relationship. 3 children. Aged 32,35,20.  She has no grandchildren. She smoked in high school but quit after graduation. No ETOH. 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  She is originally from [place].  FAMILY HISTORY: Family History  Problem Relation Age of Onset  . Cancer Mother     UTERINE  . Cancer Father     LEUKEMIA  . Crohn's disease Son    indicated that her mother is alive. She indicated that her father is deceased. She indicated that her son is alive.   Father is deceased at age 20 from leukemia, mother is alive at 6 with a history of uterine cancer.  She has one sister how is healthy and one brother who is healthy. He works as a Engineer, structural.   ALLERGIES:  is allergic to asa.  MEDICATIONS:  Current Outpatient Prescriptions  Medication Sig Dispense Refill  . aspirin-acetaminophen-caffeine (EXCEDRIN MIGRAINE) 250-250-65 MG per tablet Take 2 tablets by mouth every 6 (six) hours as needed for headache.    . Diphenhyd-Hydrocort-Nystatin (FIRST-DUKES MOUTHWASH) SUSP Use as directed 5 mLs in the mouth or throat 4 (four) times daily as needed. 300 mL 2  . fluorouracil CALGB 29798 in sodium chloride 0.9 % 150 mL Inject into the vein. To infuse over 46 hours every 14 days    . LEUCOVORIN CALCIUM IV Inject into the vein every 14 (fourteen) days.    Marland Kitchen lidocaine-prilocaine (EMLA) cream Apply a quarter size amount to port site 1 hour prior to chemo. Do not rub in. Cover with plastic wrap. 30 g 3  . lisinopril  (PRINIVIL,ZESTRIL) 10 MG tablet Take 1 tablet (10 mg total) by mouth daily. 30 tablet 6  . metFORMIN (GLUCOPHAGE) 500 MG tablet Take 500 mg by mouth daily with breakfast.     . Multiple Vitamin (MULTIVITAMIN) capsule Take 1 capsule by mouth daily.    . OXALIPLATIN IV Inject into the vein every 14 (fourteen) days.    Marland Kitchen oxyCODONE-acetaminophen (PERCOCET/ROXICET) 5-325 MG per tablet Take 1 tablet by mouth every 4 (four) hours as needed for severe pain. 60 tablet 0  . pantoprazole (PROTONIX) 40 MG tablet Take 1 tablet (40 mg total) by mouth daily. 30 tablet 3  . promethazine (PHENERGAN) 25 MG suppository Place 1 suppository (25 mg total) rectally every 6 (six) hours as needed for nausea or vomiting. 12 each 0  . rivaroxaban (XARELTO)  20 MG TABS tablet Take 1 tablet (20 mg total) by mouth daily with supper. 30 tablet 3  . escitalopram (LEXAPRO) 20 MG tablet Take 1/2 tablet by mouth daily for 5 days then increase to one tablet daily 30 tablet 6  . ondansetron (ZOFRAN) 8 MG tablet Take 1 tablet (8 mg total) by mouth every 8 (eight) hours as needed for nausea or vomiting. (Patient not taking: Reported on 01/16/2015) 30 tablet 2  . prochlorperazine (COMPAZINE) 10 MG tablet Take 1 tablet (10 mg total) by mouth every 6 (six) hours as needed (Nausea or vomiting). (Patient not taking: Reported on 12/18/2014) 30 tablet 2   No current facility-administered medications for this visit.    Review of Systems  Constitutional: Negative for fever, chills, weight loss and malaise/fatigue.  HENT: Negative for congestion, hearing loss, nosebleeds, sore throat and tinnitus.   Eyes: Negative for blurred vision, double vision, pain and discharge.  Respiratory: Negative for cough, hemoptysis, sputum production, shortness of breath and wheezing.   Cardiovascular: Negative for chest pain, palpitations, claudication, leg swelling and PND.  Gastrointestinal: Negative for heartburn, nausea, vomiting, abdominal pain, diarrhea,  constipation, blood in stool and melena.  Genitourinary: Negative for dysuria, urgency, frequency and hematuria.  Musculoskeletal: Negative for myalgias, joint pain and falls.  Skin: Negative for itching and rash.  Neurological: Negative for dizziness, tingling, tremors, sensory change, speech change, focal weakness, seizures, loss of consciousness, weakness and headaches.  Endo/Heme/Allergies: Does not bruise/bleed easily.  Psychiatric/Behavioral: Negative for depression, suicidal ideas, memory loss and substance abuse. The patient is not nervous/anxious and does not have insomnia.   14 point review of systems was performed and is negative except as detailed under history of present illness and above  PHYSICAL EXAMINATION: ECOG PERFORMANCE STATUS: 1 - Symptomatic but completely ambulatory  Filed Vitals:   01/16/15 0848  BP: 144/100  Pulse: 77  Temp: 98.9 F (37.2 C)  Resp: 18   Filed Weights   01/16/15 0848  Weight: 252 lb 3.2 oz (114.397 kg)    Physical Exam  Constitutional: She is oriented to person, place, and time and well-developed, well-nourished, and in no distress.  HENT:  Head: Normocephalic and atraumatic.  Nose: Nose normal.  Mouth/Throat: Oropharynx is clear and moist. No oropharyngeal exudate.  Eyes: Conjunctivae and EOM are normal. Pupils are equal, round, and reactive to light. Right eye exhibits no discharge. Left eye exhibits no discharge. No scleral icterus.  Neck: Normal range of motion. Neck supple. No tracheal deviation present. No thyromegaly present.  Cardiovascular: Normal rate, regular rhythm and normal heart sounds.  Exam reveals no gallop and no friction rub.   No murmur heard. Pulmonary/Chest: Effort normal and breath sounds normal. She has no wheezes. She has no rales.  Abdominal: Soft. Bowel sounds are normal. She exhibits no distension and no mass. There is no tenderness. There is no rebound and no guarding.  Musculoskeletal: Normal range of  motion. She exhibits no edema.  Lymphadenopathy:    She has no cervical adenopathy.  Neurological: She is alert and oriented to person, place, and time. She has normal reflexes. No cranial nerve deficit. Gait normal. Coordination normal.  Skin: Skin is warm and dry. No rash noted.  Psychiatric: Mood, memory, affect and judgment normal.  Nursing note and vitals reviewed.   LABORATORY DATA:  I have reviewed the data as listed CBC    Component Value Date/Time   WBC 9.4 01/01/2015 0830   RBC 3.95 01/01/2015 0830   RBC  4.36 11/16/2014 1214   HGB 12.1 01/01/2015 0830   HCT 37.7 01/01/2015 0830   PLT 227 01/01/2015 0830   MCV 95.4 01/01/2015 0830   MCH 30.6 01/01/2015 0830   MCHC 32.1 01/01/2015 0830   RDW 15.1 01/01/2015 0830   LYMPHSABS 2.9 01/01/2015 0830   MONOABS 0.8 01/01/2015 0830   EOSABS 0.2 01/01/2015 0830   BASOSABS 0.0 01/01/2015 0830    CMP     Component Value Date/Time   NA 142 01/01/2015 0830   K 3.9 01/01/2015 0830   CL 107 01/01/2015 0830   CO2 25 01/01/2015 0830   GLUCOSE 150* 01/01/2015 0830   BUN 13 01/01/2015 0830   CREATININE 0.72 01/01/2015 0830   CALCIUM 9.0 01/01/2015 0830   PROT 7.7 01/01/2015 0830   ALBUMIN 3.5 01/01/2015 0830   AST 25 01/01/2015 0830   ALT 24 01/01/2015 0830   ALKPHOS 75 01/01/2015 0830   BILITOT 0.4 01/01/2015 0830   GFRNONAA >60 01/01/2015 0830   GFRAA >60 01/01/2015 0830   CEA is pending  ASSESSMENT & PLAN:  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  She has done markedly better with dose reduction in her oxaliplatin and changing her anti-emetics. She has not required any IV hydration nor visits to the ED. She is active and has few limitations. I advised her if she desires to return to work that she certainly can.  A lot of the patient struggles  currently revolve around having an incurable illness. We spent time today discussing ways to deal with this. She is a very social person but isolates herself around her treatment. I have prescribed her Lexapro, I advised her this will not work overnight, but I do think over time this will help. Compared to when we first met she is tolerating her treatments remarkably well. She certainly has many more good days than bad days. In regards to spacing her therapy out to every 3 weeks I advised her there is not good data in regards to how well the therapy will work given in 3 week intervals. She is going to continue to think on things including taking a break from therapy. She is aware of risks and benefits of doing so.  She requires no refills today.   Advised her to try the yoga sessions that are offered to cancer patients.  She will receive treatment today.  Will refer to Dr. Buelah Manis in [facility]  All questions were answered. The patient knows to call the clinic with any problems, questions or concerns.  This note was electronically signed.    This document serves as a record of services personally performed by Ancil Linsey, MD. It was created on her behalf by Arlyce Harman, a trained medical scribe. The creation of this record is based on the scribe's personal observations and the provider's statements to them. This document has been checked and approved by the attending provider.  I have reviewed the above documentation for accuracy and completeness, and I agree with the above.  Kelby Fam. Whitney Muse, MD

## 2015-01-16 NOTE — Progress Notes (Signed)
Patient tolerated infusion well.

## 2015-01-16 NOTE — Patient Instructions (Signed)
[place] at [facility] Discharge Instructions  RECOMMENDATIONS MADE BY THE CONSULTANT AND ANY TEST RESULTS WILL BE SENT TO YOUR REFERRING PHYSICIAN.  Exam and discussion by Dr. Whitney Muse. Will put you on something for depression - Lexapro - take 1/2 tablet for 5 days then increase to 1 tablet daily. Report fevers, uncontrolled nausea, vomiting, diarrhea or other concerns.  Follow-up in 2 weeks with chemotherapy and office visit.   Thank you for choosing [place] at [facility] to provide your oncology and hematology care.  To afford each patient quality time with our provider, please arrive at least 15 minutes before your scheduled appointment time.    You need to re-schedule your appointment should you arrive 10 or more minutes late.  We strive to give you quality time with our providers, and arriving late affects you and other patients whose appointments are after yours.  Also, if you no show three or more times for appointments you may be dismissed from the clinic at the providers discretion.     Again, thank you for choosing [facility].  Our hope is that these requests will decrease the amount of time that you wait before being seen by our physicians.       _____________________________________________________________  Should you have questions after your visit to [facility], please contact our office at [phone] between the hours of 8:30 a.m. and 4:30 p.m.  Voicemails left after 4:30 p.m. will not be returned until the following business day.  For prescription refill requests, have your pharmacy contact our office.

## 2015-01-16 NOTE — Patient Instructions (Signed)
[facility] Discharge Instructions for Patients Receiving Chemotherapy  Today you received the following chemotherapy agents oxaliplatin, leucovorin, and flourouricil.    To help prevent nausea and vomiting after your treatment, we encourage you to take your nausea medication Ativan '1mg'$  every 3 to 4 hours as needed for nausea or vomiting Begin taking it as needed and take it as often as prescribed for the next 12 hours.   If you develop nausea and vomiting, or diarrhea that is not controlled by your medication, call the clinic.  The clinic phone number is [phone]. Office hours are Monday-Friday 8:30am-5:00pm.  BELOW ARE SYMPTOMS THAT SHOULD BE REPORTED IMMEDIATELY:  *FEVER GREATER THAN 101.0 F  *CHILLS WITH OR WITHOUT FEVER  NAUSEA AND VOMITING THAT IS NOT CONTROLLED WITH YOUR NAUSEA MEDICATION  *UNUSUAL SHORTNESS OF BREATH  *UNUSUAL BRUISING OR BLEEDING  TENDERNESS IN MOUTH AND THROAT WITH OR WITHOUT PRESENCE OF ULCERS  *URINARY PROBLEMS  *BOWEL PROBLEMS  UNUSUAL RASH Items with * indicate a potential emergency and should be followed up as soon as possible. If you have an emergency after office hours please contact your primary care physician or go to the nearest emergency department.  Please call the clinic during office hours if you have any questions or concerns.   You may also contact the Patient Navigator at [phone] should you have any questions or need assistance in obtaining follow up care. _____________________________________________________________________ Have you asked about our STAR program?    STAR stands for Survivorship Training and Rehabilitation, and this is a nationally recognized cancer care program that focuses on survivorship and rehabilitation.  Cancer and cancer treatments may cause problems, such as, pain, making you feel tired and keeping you from doing the things that you need or want to do. Cancer rehabilitation can help.  Our goal is to reduce these troubling effects and help you have the best quality of life possible.  You may receive a survey from a nurse that asks questions about your current state of health.  Based on the survey results, all eligible patients will be referred to the [facility] program for an evaluation so we can better serve you! A frequently asked questions sheet is available upon request.

## 2015-01-17 LAB — CEA: CEA: 145.4 ng/mL — ABNORMAL HIGH (ref 0.0–4.7)

## 2015-01-18 ENCOUNTER — Encounter (HOSPITAL_BASED_OUTPATIENT_CLINIC_OR_DEPARTMENT_OTHER)

## 2015-01-18 ENCOUNTER — Encounter (HOSPITAL_COMMUNITY): Payer: Self-pay

## 2015-01-18 VITALS — BP 140/96 | HR 78 | Temp 98.3°F | Resp 20

## 2015-01-18 DIAGNOSIS — R110 Nausea: Secondary | ICD-10-CM | POA: Diagnosis not present

## 2015-01-18 DIAGNOSIS — C787 Secondary malignant neoplasm of liver and intrahepatic bile duct: Secondary | ICD-10-CM | POA: Diagnosis not present

## 2015-01-18 DIAGNOSIS — C187 Malignant neoplasm of sigmoid colon: Secondary | ICD-10-CM | POA: Diagnosis not present

## 2015-01-18 DIAGNOSIS — C7801 Secondary malignant neoplasm of right lung: Secondary | ICD-10-CM

## 2015-01-18 DIAGNOSIS — C189 Malignant neoplasm of colon, unspecified: Secondary | ICD-10-CM

## 2015-01-18 DIAGNOSIS — C7800 Secondary malignant neoplasm of unspecified lung: Principal | ICD-10-CM

## 2015-01-18 MED ORDER — HEPARIN SOD (PORK) LOCK FLUSH 100 UNIT/ML IV SOLN
500.0000 [IU] | Freq: Once | INTRAVENOUS | Status: AC | PRN
  Administered 2015-01-18: 500 [IU]

## 2015-01-18 MED ORDER — PALONOSETRON HCL INJECTION 0.25 MG/5ML
0.2500 mg | Freq: Once | INTRAVENOUS | Status: AC
  Administered 2015-01-18: 0.25 mg via INTRAVENOUS

## 2015-01-18 MED ORDER — PALONOSETRON HCL INJECTION 0.25 MG/5ML
INTRAVENOUS | Status: AC
  Filled 2015-01-18: qty 5

## 2015-01-18 MED ORDER — SODIUM CHLORIDE 0.9 % IJ SOLN
10.0000 mL | INTRAMUSCULAR | Status: DC | PRN
  Administered 2015-01-18: 10 mL
  Filled 2015-01-18: qty 10

## 2015-01-18 MED ORDER — HEPARIN SOD (PORK) LOCK FLUSH 100 UNIT/ML IV SOLN
INTRAVENOUS | Status: AC
  Filled 2015-01-18: qty 5

## 2015-01-18 NOTE — Patient Instructions (Signed)
[place] at [facility] Discharge Instructions  RECOMMENDATIONS MADE BY THE CONSULTANT AND ANY TEST RESULTS WILL BE SENT TO YOUR REFERRING PHYSICIAN.  Port flushed and pump removed Follow up as scheduled Please call the clinic if you have any questions or concerns  Thank you for choosing [place] at [facility] to provide your oncology and hematology care.  To afford each patient quality time with our provider, please arrive at least 15 minutes before your scheduled appointment time.    You need to re-schedule your appointment should you arrive 10 or more minutes late.  We strive to give you quality time with our providers, and arriving late affects you and other patients whose appointments are after yours.  Also, if you no show three or more times for appointments you may be dismissed from the clinic at the providers discretion.     Again, thank you for choosing [facility].  Our hope is that these requests will decrease the amount of time that you wait before being seen by our physicians.       _____________________________________________________________  Should you have questions after your visit to [facility], please contact our office at [phone] between the hours of 8:30 a.m. and 4:30 p.m.  Voicemails left after 4:30 p.m. will not be returned until the following business day.  For prescription refill requests, have your pharmacy contact our office.

## 2015-01-18 NOTE — Progress Notes (Signed)
Erica Barker presented for Portacath de-access and flush.  Proper placement of portacath confirmed by CXR.  Portacath located right chest wall accessed with  H 20 needle.  Good blood return present. Portacath flushed with 2m NS and 500U/564mHeparin and needle removed intact.  Procedure tolerated well and without incident.  Pump removed, pt nauseated this cycle.  Pt has not taken any nausea medications.  Aloxi IVP given and told pt to make sure she is taking her nasuea medications.

## 2015-01-29 ENCOUNTER — Ambulatory Visit (HOSPITAL_COMMUNITY)
Admission: RE | Admit: 2015-01-29 | Discharge: 2015-01-29 | Disposition: A | Discharge status: Home / Self Care | Source: Ambulatory Visit | Attending: Oncology | Admitting: Oncology

## 2015-01-29 ENCOUNTER — Encounter (HOSPITAL_COMMUNITY): Payer: Self-pay | Admitting: Oncology

## 2015-01-29 ENCOUNTER — Encounter (HOSPITAL_BASED_OUTPATIENT_CLINIC_OR_DEPARTMENT_OTHER)

## 2015-01-29 ENCOUNTER — Encounter (HOSPITAL_BASED_OUTPATIENT_CLINIC_OR_DEPARTMENT_OTHER): Admitting: Oncology

## 2015-01-29 ENCOUNTER — Ambulatory Visit (HOSPITAL_COMMUNITY)

## 2015-01-29 VITALS — BP 156/96 | HR 73 | Temp 98.1°F | Resp 20

## 2015-01-29 VITALS — BP 146/78 | HR 73 | Temp 97.6°F | Resp 18 | Wt 251.4 lb

## 2015-01-29 DIAGNOSIS — C7800 Secondary malignant neoplasm of unspecified lung: Principal | ICD-10-CM

## 2015-01-29 DIAGNOSIS — C787 Secondary malignant neoplasm of liver and intrahepatic bile duct: Secondary | ICD-10-CM | POA: Diagnosis not present

## 2015-01-29 DIAGNOSIS — C189 Malignant neoplasm of colon, unspecified: Secondary | ICD-10-CM

## 2015-01-29 DIAGNOSIS — M542 Cervicalgia: Secondary | ICD-10-CM

## 2015-01-29 DIAGNOSIS — Z5111 Encounter for antineoplastic chemotherapy: Secondary | ICD-10-CM

## 2015-01-29 DIAGNOSIS — C187 Malignant neoplasm of sigmoid colon: Secondary | ICD-10-CM | POA: Diagnosis not present

## 2015-01-29 DIAGNOSIS — C7801 Secondary malignant neoplasm of right lung: Secondary | ICD-10-CM | POA: Diagnosis not present

## 2015-01-29 DIAGNOSIS — T829XXA Unspecified complication of cardiac and vascular prosthetic device, implant and graft, initial encounter: Secondary | ICD-10-CM | POA: Insufficient documentation

## 2015-01-29 DIAGNOSIS — Z86718 Personal history of other venous thrombosis and embolism: Secondary | ICD-10-CM

## 2015-01-29 DIAGNOSIS — F329 Major depressive disorder, single episode, unspecified: Secondary | ICD-10-CM

## 2015-01-29 DIAGNOSIS — Z5112 Encounter for antineoplastic immunotherapy: Secondary | ICD-10-CM

## 2015-01-29 LAB — CBC WITH DIFFERENTIAL/PLATELET
BASOS PCT: 0 % (ref 0–1)
Basophils Absolute: 0 10*3/uL (ref 0.0–0.1)
EOS PCT: 1 % (ref 0–5)
Eosinophils Absolute: 0.1 10*3/uL (ref 0.0–0.7)
HCT: 37.6 % (ref 36.0–46.0)
Hemoglobin: 12.3 g/dL (ref 12.0–15.0)
LYMPHS ABS: 2.7 10*3/uL (ref 0.7–4.0)
Lymphocytes Relative: 32 % (ref 12–46)
MCH: 30.4 pg (ref 26.0–34.0)
MCHC: 32.7 g/dL (ref 30.0–36.0)
MCV: 93.1 fL (ref 78.0–100.0)
Monocytes Absolute: 0.8 10*3/uL (ref 0.1–1.0)
Monocytes Relative: 9 % (ref 3–12)
NEUTROS ABS: 4.8 10*3/uL (ref 1.7–7.7)
Neutrophils Relative %: 58 % (ref 43–77)
Platelets: 185 10*3/uL (ref 150–400)
RBC: 4.04 MIL/uL (ref 3.87–5.11)
RDW: 15.3 % (ref 11.5–15.5)
WBC: 8.4 10*3/uL (ref 4.0–10.5)

## 2015-01-29 LAB — URINALYSIS, DIPSTICK ONLY
Bilirubin Urine: NEGATIVE
GLUCOSE, UA: 250 mg/dL — AB
Ketones, ur: NEGATIVE mg/dL
Leukocytes, UA: NEGATIVE
Nitrite: NEGATIVE
Protein, ur: NEGATIVE mg/dL
Specific Gravity, Urine: >1.03 — ABNORMAL HIGH (ref 1.005–1.030)
UROBILINOGEN UA: 0.2 mg/dL (ref 0.0–1.0)
pH: 5.5 (ref 5.0–8.0)

## 2015-01-29 LAB — COMPREHENSIVE METABOLIC PANEL
ALBUMIN: 3.5 g/dL (ref 3.5–5.0)
ALK PHOS: 80 U/L (ref 38–126)
ALT: 16 U/L (ref 14–54)
AST: 19 U/L (ref 15–41)
Anion gap: 10 (ref 5–15)
BUN: 12 mg/dL (ref 6–20)
CO2: 24 mmol/L (ref 22–32)
CREATININE: 0.75 mg/dL (ref 0.44–1.00)
Calcium: 8.9 mg/dL (ref 8.9–10.3)
Chloride: 105 mmol/L (ref 101–111)
GFR calc Af Amer: >60 mL/min (ref >60)
GFR calc non Af Amer: >60 mL/min (ref >60)
GLUCOSE: 134 mg/dL — AB (ref 65–99)
Potassium: 3.8 mmol/L (ref 3.5–5.1)
SODIUM: 139 mmol/L (ref 135–145)
TOTAL PROTEIN: 7.5 g/dL (ref 6.5–8.1)
Total Bilirubin: 0.5 mg/dL (ref 0.3–1.2)

## 2015-01-29 MED ORDER — SODIUM CHLORIDE 0.9 % IV SOLN
Freq: Once | INTRAVENOUS | Status: AC
  Administered 2015-01-29: 13:00:00 via INTRAVENOUS

## 2015-01-29 MED ORDER — LORAZEPAM 2 MG/ML IJ SOLN
1.0000 mg | Freq: Once | INTRAMUSCULAR | Status: AC
  Administered 2015-01-29: 1 mg via INTRAVENOUS
  Filled 2015-01-29: qty 1

## 2015-01-29 MED ORDER — DEXTROSE 5 % IV SOLN
Freq: Once | INTRAVENOUS | Status: AC
  Administered 2015-01-29: 11:00:00 via INTRAVENOUS

## 2015-01-29 MED ORDER — OXALIPLATIN CHEMO INJECTION 100 MG/20ML
68.0000 mg/m2 | Freq: Once | INTRAVENOUS | Status: AC
  Administered 2015-01-29: 150 mg via INTRAVENOUS
  Filled 2015-01-29: qty 30

## 2015-01-29 MED ORDER — SODIUM CHLORIDE 0.9 % IV SOLN
5.0000 mg/kg | Freq: Once | INTRAVENOUS | Status: AC
  Administered 2015-01-29: 550 mg via INTRAVENOUS
  Filled 2015-01-29: qty 6

## 2015-01-29 MED ORDER — IOHEXOL 300 MG/ML  SOLN
50.0000 mL | Freq: Once | INTRAMUSCULAR | Status: AC | PRN
  Administered 2015-01-29: 50 mL via INTRAVENOUS

## 2015-01-29 MED ORDER — SODIUM CHLORIDE 0.9 % IJ SOLN
INTRAMUSCULAR | Status: AC
  Filled 2015-01-29: qty 3

## 2015-01-29 MED ORDER — SODIUM CHLORIDE 0.9 % IV SOLN
Freq: Once | INTRAVENOUS | Status: AC
  Administered 2015-01-29: 12:00:00 via INTRAVENOUS
  Filled 2015-01-29: qty 5

## 2015-01-29 MED ORDER — SODIUM CHLORIDE 0.9 % IV SOLN: 12.0000 mg | Freq: Once | INTRAVENOUS | Status: DC

## 2015-01-29 MED ORDER — PALONOSETRON HCL INJECTION 0.25 MG/5ML
0.2500 mg | Freq: Once | INTRAVENOUS | Status: AC
  Administered 2015-01-29: 0.25 mg via INTRAVENOUS
  Filled 2015-01-29: qty 5

## 2015-01-29 MED ORDER — SODIUM CHLORIDE 0.9 % IV SOLN
2400.0000 mg/m2 | INTRAVENOUS | Status: DC
  Administered 2015-01-29: 5400 mg via INTRAVENOUS
  Filled 2015-01-29: qty 108

## 2015-01-29 MED ORDER — SODIUM CHLORIDE 0.9 % IJ SOLN
10.0000 mL | INTRAMUSCULAR | Status: DC | PRN
  Administered 2015-01-29: 10 mL
  Filled 2015-01-29: qty 10

## 2015-01-29 MED ORDER — LEUCOVORIN CALCIUM INJECTION 350 MG
400.0000 mg/m2 | Freq: Once | INTRAMUSCULAR | Status: AC
  Administered 2015-01-29: 896 mg via INTRAVENOUS
  Filled 2015-01-29: qty 44.8

## 2015-01-29 NOTE — Patient Instructions (Signed)
[place] at [facility] Discharge Instructions  RECOMMENDATIONS MADE BY THE CONSULTANT AND ANY TEST RESULTS WILL BE SENT TO YOUR REFERRING PHYSICIAN.  Exam completed by Caroleen Hamman today CT scan in 3 weeks Dye study today of your right neck where it was swollen. If study is ok we will treat. Return in 2 weeks for chemotherapy and to see the doctor in 3-4 weeks. Please call the clinic if you have any questions or concerns   Thank you for choosing [place] at [facility] to provide your oncology and hematology care.  To afford each patient quality time with our provider, please arrive at least 15 minutes before your scheduled appointment time.    You need to re-schedule your appointment should you arrive 10 or more minutes late.  We strive to give you quality time with our providers, and arriving late affects you and other patients whose appointments are after yours.  Also, if you no show three or more times for appointments you may be dismissed from the clinic at the providers discretion.     Again, thank you for choosing [facility].  Our hope is that these requests will decrease the amount of time that you wait before being seen by our physicians.       _____________________________________________________________  Should you have questions after your visit to [facility], please contact our office at [phone] between the hours of 8:30 a.m. and 4:30 p.m.  Voicemails left after 4:30 p.m. will not be returned until the following business day.  For prescription refill requests, have your pharmacy contact our office.    \

## 2015-01-29 NOTE — Assessment & Plan Note (Addendum)
Stage IV CRC on FOLFOX + Avastin therapy, previously diagnosed and treated at [facility] until she transferred her oncology care to [facility] in March 2016.  She is on Xarelto anticoagulation  Labs today: CBC diff, CMET, UA.  Began Lexapro on 01/16/2015 for depression.  Patient educated regarding the role of therapy in the stage IV setting.  I think she has a better grasp of this.  Port study today for right neck discomfort and swelling.   Tx today if labs meet treatment parameters.    Future restaging scans in 3 weeks with CT CAP w contrast.  She wants to consider Xeloda therapy in the future as this will help her with her work schedule.  i think this is reasonable after restaging tests.  Return in 2 weeks for follow-up.

## 2015-01-29 NOTE — Progress Notes (Signed)
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 female returns for followup of Stage IV CRC.    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   02/20/2014 Imaging CT of abdomen and pelvis on 02/20/2014 at [facility] showing 3.6 cm apple core lesion in the proximal sigmoid colon 11 mm hypoenhancing lesion in the medial left hepatic dome, 4.1 cm right lower lobe mass and adjacent right lower lobe nodule   04/17/2014 Imaging Right upper extremity DVT 04/17/2014 paired brachial veins, axillary vein, and peripheral aspect of the right subclavian vein   07/06/2014 Imaging CT chest 07/06/2014 with bilateral pulmonary nodules and masses, RUL, lateral RUL, posterior LUL, lobulated nodule in the subpleural RLL   08/18/2014 Tumor Marker CEA 93.7 ng/ml   10/09/2014 Initial Diagnosis 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  The results are noted within this dictation.  She reports right sided neck achiness that started on Thursday and lasted until Saturday.  She notes that it is on her port side.  Her neck is edematous as well.  She reports that she "did not recover as fast during this treatment."  On longer discussion regarding this issue, she notes that she got treated on Tuesday, pump was removed on Thursday, and she felt fatigued from Thursday - Sunday.  She has since had a good week.    "How long do I have to keep doing this?"  I used an anaology to decribe the role of systemic  chemotherapy in the Stage IV setting.  I think she understands thi better now.  Past Medical History  Diagnosis Date  . Hypertension   . Depression   . Pulmonary nodules 01/30/14    CTA CHEST  . Lymphadenopathy, mediastinal 01/2014    CTA ANGIO .Marland KitchenOretta  . Morbid obesity   . Diabetes mellitus, type II   . 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     is allergic to asa.  Erica Barker does not currently have medications on file.  Past Surgical History  Procedure Laterality Date  . Cholecystectomy    . Video bronchoscopy with endobronchial ultrasound N/A 02/06/2014    Procedure: VIDEO BRONCHOSCOPY WITH ENDOBRONCHIAL ULTRASOUND,bronchial biopsies , node sampling;  Surgeon: Melrose Nakayama, MD;  Location: [place];  Service: Thoracic;  Laterality: N/A;  . Tubal ligation      Denies any headaches, dizziness, double vision, fevers, chills, night sweats, nausea, vomiting, diarrhea, constipation, chest pain, heart palpitations, shortness of breath, blood in stool, black tarry stool, urinary pain, urinary burning, urinary frequency, hematuria.   PHYSICAL EXAMINATION  ECOG PERFORMANCE STATUS: 0 - Asymptomatic  Filed Vitals:   01/29/15 0800  BP: 146/78  Pulse: 73  Temp: 97.6 F (36.4 C)  Resp: 18    GENERAL:alert, no distress, well nourished, well developed, comfortable, cooperative, obese and smiling, accompanied by daughter. SKIN:  skin color, texture, turgor are normal, no rashes or significant lesions HEAD: Normocephalic, No masses, lesions, tenderness or abnormalities EYES: normal, PERRLA, EOMI, Conjunctiva are pink and non-injected EARS: External ears normal OROPHARYNX:lips, buccal mucosa, and tongue normal and mucous membranes are moist  NECK: supple, no adenopathy, thyroid normal size, non-tender,  without nodularity, no stridor, non-tender, trachea midline, Right neck edema with some edema at the medial aspect of supraclavicular region. LYMPH:  no palpable lymphadenopathy, no hepatosplenomegaly BREAST:not examined LUNGS: clear to auscultation  HEART: regular rate & rhythm, no murmurs, no gallops, S1 normal and S2 normal ABDOMEN:abdomen soft, non-tender, obese and normal bowel sounds BACK: Back symmetric, no curvature. EXTREMITIES:less then 2 second capillary refill, no joint deformities, effusion, or inflammation, no skin discoloration, no cyanosis  NEURO: alert & oriented x 3 with fluent speech, no focal motor/sensory deficits, gait normal   LABORATORY DATA: CBC    Component Value Date/Time   WBC 8.4 01/29/2015 0852   RBC 4.04 01/29/2015 0852   RBC 4.36 11/16/2014 1214   HGB 12.3 01/29/2015 0852   HCT 37.6 01/29/2015 0852   PLT 185 01/29/2015 0852   MCV 93.1 01/29/2015 0852   MCH 30.4 01/29/2015 0852   MCHC 32.7 01/29/2015 0852   RDW 15.3 01/29/2015 0852   LYMPHSABS 2.7 01/29/2015 0852   MONOABS 0.8 01/29/2015 0852   EOSABS 0.1 01/29/2015 0852   BASOSABS 0.0 01/29/2015 0852      Chemistry      Component Value Date/Time   NA 139 01/29/2015 0852   K 3.8 01/29/2015 0852   CL 105 01/29/2015 0852   CO2 24 01/29/2015 0852   BUN 12 01/29/2015 0852   CREATININE 0.75 01/29/2015 0852      Component Value Date/Time   CALCIUM 8.9 01/29/2015 0852   ALKPHOS 80 01/29/2015 0852   AST 19 01/29/2015 0852   ALT 16 01/29/2015 0852   BILITOT 0.5 01/29/2015 0852     Lab Results  Component Value Date   CEA 145.4* 01/16/2015      ASSESSMENT AND PLAN:  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  She is on Xarelto anticoagulation  Labs today: CBC diff, CMET, UA.  Began Lexapro on 01/16/2015 for  depression.  Patient educated regarding the role of therapy in the stage IV setting.  I think she has a better grasp of this.  Port study today for right neck discomfort and swelling.   Tx today if labs meet treatment parameters.    Future restaging scans in 3 weeks with CT CAP w contrast.  She wants to consider Xeloda therapy in the future as this will help her with her work schedule.  i think this is reasonable after restaging tests.  Return in 2 weeks for follow-up.     THERAPY PLAN:  Continue with treatment as planned.  All questions were answered. The patient knows to call the clinic with any problems, questions or concerns. We can certainly see the patient much sooner if necessary.  Patient and plan discussed with Dr. Ancil Linsey and she is in agreement with the aforementioned.   This note is electronically signed by: Robynn Pane 01/29/2015 10:14 AM

## 2015-01-29 NOTE — Progress Notes (Signed)
Patient returned from xray and report reviewed with T.Kefalas PA-C. Decision made to proceed with treatment

## 2015-01-29 NOTE — Progress Notes (Signed)
Tolerated chemo well. D/C home with continuous infusion pump intact. D/C via wheelchair,family at side.

## 2015-01-31 ENCOUNTER — Encounter (HOSPITAL_BASED_OUTPATIENT_CLINIC_OR_DEPARTMENT_OTHER)

## 2015-01-31 VITALS — BP 131/84 | HR 77 | Temp 98.8°F | Resp 18

## 2015-01-31 DIAGNOSIS — C187 Malignant neoplasm of sigmoid colon: Secondary | ICD-10-CM | POA: Diagnosis not present

## 2015-01-31 DIAGNOSIS — C7800 Secondary malignant neoplasm of unspecified lung: Principal | ICD-10-CM

## 2015-01-31 DIAGNOSIS — C7801 Secondary malignant neoplasm of right lung: Secondary | ICD-10-CM | POA: Diagnosis not present

## 2015-01-31 DIAGNOSIS — C787 Secondary malignant neoplasm of liver and intrahepatic bile duct: Secondary | ICD-10-CM | POA: Diagnosis not present

## 2015-01-31 DIAGNOSIS — C189 Malignant neoplasm of colon, unspecified: Secondary | ICD-10-CM

## 2015-01-31 MED ORDER — PALONOSETRON HCL INJECTION 0.25 MG/5ML
INTRAVENOUS | Status: AC
  Filled 2015-01-31: qty 5

## 2015-01-31 MED ORDER — SODIUM CHLORIDE 0.9 % IJ SOLN
10.0000 mL | INTRAMUSCULAR | Status: DC | PRN
  Administered 2015-01-31: 10 mL
  Filled 2015-01-31: qty 10

## 2015-01-31 MED ORDER — HEPARIN SOD (PORK) LOCK FLUSH 100 UNIT/ML IV SOLN
500.0000 [IU] | Freq: Once | INTRAVENOUS | Status: AC | PRN
  Administered 2015-01-31: 500 [IU]

## 2015-01-31 MED ORDER — HEPARIN SOD (PORK) LOCK FLUSH 100 UNIT/ML IV SOLN
INTRAVENOUS | Status: AC
  Filled 2015-01-31: qty 5

## 2015-01-31 MED ORDER — PALONOSETRON HCL INJECTION 0.25 MG/5ML
0.2500 mg | Freq: Once | INTRAVENOUS | Status: AC
  Administered 2015-01-31: 0.25 mg via INTRAVENOUS

## 2015-01-31 NOTE — Progress Notes (Signed)
Erica Barker presented for Portacath access and flush. Proper placement of portacath confirmed by CXR. Portacath located right chest wall accessed with  H 20 needle. Good blood return present. Portacath flushed with 51m NS and 500U/540mHeparin and needle removed intact. Procedure without incident. Patient tolerated procedure well.  Home infusion pump removed.  VSS.  No ill side effects reported today.

## 2015-02-12 ENCOUNTER — Ambulatory Visit (HOSPITAL_COMMUNITY): Admitting: Oncology

## 2015-02-12 ENCOUNTER — Inpatient Hospital Stay (HOSPITAL_COMMUNITY)

## 2015-02-12 NOTE — Assessment & Plan Note (Deleted)
Stage IV CRC on FOLFOX + Avastin therapy, previously diagnosed and treated at [facility] until she transferred her oncology care to [facility] in March 2016.  She is on Xarelto anticoagulation  Oncology history updated.  Labs today: CBC diff, CMET, UA.  Began Lexapro on 01/16/2015 for depression.  Tx today if labs meet treatment parameters.    Future restaging scans scheduled for 8/8.  She wants to consider Xeloda therapy in the future as this will help her with her work schedule.  I think this is reasonable after restaging tests.  Return as scheduled for follow-up and to review CT imaging results.

## 2015-02-12 NOTE — Progress Notes (Signed)
-  NO SHOW-  Erica Barker 02/12/2015 10:00 AM

## 2015-02-14 ENCOUNTER — Encounter (HOSPITAL_COMMUNITY)

## 2015-02-19 ENCOUNTER — Ambulatory Visit (HOSPITAL_COMMUNITY)
Admission: RE | Admit: 2015-02-19 | Discharge: 2015-02-19 | Disposition: A | Discharge status: Home / Self Care | Source: Ambulatory Visit | Attending: Oncology | Admitting: Oncology

## 2015-02-19 DIAGNOSIS — Z08 Encounter for follow-up examination after completed treatment for malignant neoplasm: Secondary | ICD-10-CM | POA: Insufficient documentation

## 2015-02-19 DIAGNOSIS — C7800 Secondary malignant neoplasm of unspecified lung: Secondary | ICD-10-CM | POA: Diagnosis not present

## 2015-02-19 DIAGNOSIS — R590 Localized enlarged lymph nodes: Secondary | ICD-10-CM | POA: Insufficient documentation

## 2015-02-19 DIAGNOSIS — C189 Malignant neoplasm of colon, unspecified: Secondary | ICD-10-CM | POA: Insufficient documentation

## 2015-02-19 MED ORDER — HEPARIN SOD (PORK) LOCK FLUSH 100 UNIT/ML IV SOLN
INTRAVENOUS | Status: AC
  Administered 2015-02-19: 500 [IU]
  Filled 2015-02-19: qty 5

## 2015-02-19 MED ORDER — IOHEXOL 300 MG/ML  SOLN
100.0000 mL | Freq: Once | INTRAMUSCULAR | Status: AC | PRN
  Administered 2015-02-19: 100 mL via INTRAVENOUS

## 2015-02-20 ENCOUNTER — Encounter (HOSPITAL_COMMUNITY): Attending: Hematology & Oncology | Admitting: Hematology & Oncology

## 2015-02-20 VITALS — BP 136/88 | HR 93 | Temp 98.0°F | Resp 16 | Wt 255.1 lb

## 2015-02-20 DIAGNOSIS — C187 Malignant neoplasm of sigmoid colon: Secondary | ICD-10-CM

## 2015-02-20 DIAGNOSIS — R51 Headache: Secondary | ICD-10-CM

## 2015-02-20 DIAGNOSIS — I82621 Acute embolism and thrombosis of deep veins of right upper extremity: Secondary | ICD-10-CM

## 2015-02-20 DIAGNOSIS — C7800 Secondary malignant neoplasm of unspecified lung: Secondary | ICD-10-CM | POA: Insufficient documentation

## 2015-02-20 DIAGNOSIS — C7801 Secondary malignant neoplasm of right lung: Secondary | ICD-10-CM | POA: Diagnosis not present

## 2015-02-20 DIAGNOSIS — C787 Secondary malignant neoplasm of liver and intrahepatic bile duct: Secondary | ICD-10-CM | POA: Diagnosis not present

## 2015-02-20 DIAGNOSIS — C189 Malignant neoplasm of colon, unspecified: Secondary | ICD-10-CM | POA: Insufficient documentation

## 2015-02-20 MED ORDER — RIVAROXABAN 20 MG PO TABS: 20.0000 mg | ORAL_TABLET | Freq: Every day | ORAL | Status: DC

## 2015-02-20 NOTE — Progress Notes (Signed)
[facility] Progress Note  Patient Care Team: [name], [place] as PCP - General Melrose Nakayama, MD as Consulting Physician (Cardiothoracic Surgery)   CHIEF COMPLAINTS/PURPOSE OF CONSULTATION:  Stage IV CRC CT of abdomen and pelvis on 02/20/2014 at [facility] showing 3.6 cm apple core lesion in the proximal sigmoid colon 11 mm hypoenhancing lesion in the medial left hepatic dome, 4.1 cm right lower lobe mass and adjacent right lower lobe nodule CEA on 08/18/2014 of 93.7 ng/ml Right upper extremity DVT 04/17/2014 paired brachial veins, axillary vein, and peripheral aspect of the right subclavian vein CT chest 07/06/2014 with bilateral pulmonary nodules and masses, RUL, lateral RUL, posterior LUL, lobulated nodule in the subpleural RLL,     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   02/20/2014 Imaging CT of abdomen and pelvis on 02/20/2014 at [facility] showing 3.6 cm apple core lesion in the proximal sigmoid colon 11 mm hypoenhancing lesion in the medial left hepatic dome, 4.1 cm right lower lobe mass and adjacent right lower lobe nodule   04/17/2014 Imaging Right upper extremity DVT 04/17/2014 paired brachial veins, axillary vein, and peripheral aspect of the right subclavian vein   07/06/2014 Imaging CT chest 07/06/2014 with bilateral pulmonary nodules and masses, RUL, lateral RUL, posterior LUL, lobulated nodule in the subpleural RLL   08/18/2014 Tumor Marker CEA 93.7 ng/ml   10/09/2014 Initial Diagnosis 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    Imaging Restaging CT CAP scheduled for 8/8    HISTORY OF PRESENTING ILLNESS:   Erica Barker 54 y.o. female is here because of stage IV colon cancer.  She is here today with a friend. She has been feeling great, except for having a lot of headaches lately. She has the headaches about  three times a week. The headaches begin in the back of the head and move forward to the front of the head. She attributes these to stress. She has decided to sell her home and move into an apartment in [place]. She notes this is particularly difficult as she raised her kids in her current home.  She has no new cough. She has been eating well and sleeping well. She has been going out with friends and being active. She denies any swelling in her legs. She notes that treatment was becoming somewhat intolerable and she felt she needed a break. She is here to review the results of her CT scan. She is hoping she can take a few more weeks off of therapy to continue moving forward with changes she is making.  MEDICAL HISTORY:  Past Medical History  Diagnosis Date  . Hypertension   . Depression   . Pulmonary nodules 01/30/14    CTA CHEST  . Lymphadenopathy, mediastinal 01/2014    CTA ANGIO .[place]land Kitchen[place]ut[place]  . Morbid obesity   . Diabetes mellitus, type II   . Headache(784.0)     SURGICAL HISTORY: Past Surgical History  Procedure Laterality Date  . Cholecystectomy    . Video bronchoscopy with endobronchial ultrasound N/A 02/06/2014    Procedure: VIDEO BRONCHOSCOPY WITH ENDOBRONCHIAL ULTRASOUND,bronchial biopsies , node sampling;  Surgeon: Melrose Nakayama, MD;  Location: [place];  Service: Thoracic;  Laterality: N/A;  . Tubal ligation      SOCIAL HISTORY: Social History   Social History  . Marital Status: Married    Spouse Name: N/A  .  Number of Children: N/A  . Years of Education: N/A   Occupational History  . Not on file.   Social History Main Topics  . Smoking status: Former Smoker -- 0.50 packs/day for 7 years    Types: Cigarettes    Quit date: 02/03/1989  . Smokeless tobacco: Never Used  . Alcohol Use: Yes     Comment: occasional  . Drug Use: No  . Sexual Activity: Not on file   Other Topics Concern  . Not on file   Social History Narrative  She is in a  relationship. 3 children. Aged 32,35,20.  She has no grandchildren. She smoked in high school but quit after graduation. No ETOH. 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  She is originally from [place].  FAMILY HISTORY: Family History  Problem Relation Age of Onset  . Cancer Mother     UTERINE  . Cancer Father     LEUKEMIA  . Crohn's disease Son    indicated that her mother is alive. She indicated that her father is deceased. She indicated that her son is alive.   Father is deceased at age 63 from leukemia, mother is alive at 27 with a history of uterine cancer.  She has one sister how is healthy and one brother who is healthy. He works as a Engineer, structural.   ALLERGIES:  is allergic to asa.  MEDICATIONS:  Current Outpatient Prescriptions  Medication Sig Dispense Refill  . aspirin-acetaminophen-caffeine (EXCEDRIN MIGRAINE) 250-250-65 MG per tablet Take 2 tablets by mouth every 6 (six) hours as needed for headache.    . Diphenhyd-Hydrocort-Nystatin (FIRST-DUKES MOUTHWASH) SUSP Use as directed 5 mLs in the mouth or throat 4 (four) times daily as needed. 300 mL 2  . escitalopram (LEXAPRO) 20 MG tablet Take 1/2 tablet by mouth daily for 5 days then increase to one tablet daily 30 tablet 6  . fluorouracil CALGB 29518 in sodium chloride 0.9 % 150 mL Inject into the vein. To infuse over 46 hours every 14 days    . LEUCOVORIN CALCIUM IV Inject into the vein every 14 (fourteen) days.    Marland Kitchen lidocaine-prilocaine (EMLA) cream Apply a quarter size amount to port site 1 hour prior to chemo. Do not rub in. Cover with plastic wrap. 30 g 3  . lisinopril (PRINIVIL,ZESTRIL) 10 MG tablet Take 1 tablet (10 mg total) by mouth daily. 30 tablet 6  . metFORMIN (GLUCOPHAGE) 500 MG tablet Take 500 mg by mouth daily with breakfast.     . Multiple Vitamin (MULTIVITAMIN) capsule Take 1 capsule by mouth daily.    . ondansetron (ZOFRAN) 8 MG tablet Take 1 tablet (8 mg total) by  mouth every 8 (eight) hours as needed for nausea or vomiting. 30 tablet 2  . OXALIPLATIN IV Inject into the vein every 14 (fourteen) days.    Marland Kitchen oxyCODONE-acetaminophen (PERCOCET/ROXICET) 5-325 MG per tablet Take 1 tablet by mouth every 4 (four) hours as needed for severe pain. 60 tablet 0  . pantoprazole (PROTONIX) 40 MG tablet Take 1 tablet (40 mg total) by mouth daily. 30 tablet 3  . prochlorperazine (COMPAZINE) 10 MG tablet Take 1 tablet (10 mg total) by mouth every 6 (six) hours as needed (Nausea or vomiting). 30 tablet 2  . promethazine (PHENERGAN) 25 MG suppository Place 1 suppository (25 mg total) rectally every 6 (six) hours as needed for nausea or vomiting. 12 each 0  . rivaroxaban (XARELTO) 20  MG TABS tablet Take 1 tablet (20 mg total) by mouth daily with supper. 30 tablet 3   No current facility-administered medications for this visit.    Review of Systems  Constitutional: Negative for fever, chills, weight loss and malaise/fatigue.  HENT: Negative for congestion, hearing loss, nosebleeds, sore throat and tinnitus.   Eyes: Negative for blurred vision, double vision, pain and discharge.  Respiratory: Negative for cough, hemoptysis, sputum production, shortness of breath and wheezing.   Cardiovascular: Negative for chest pain, palpitations, claudication, leg swelling and PND.  Gastrointestinal: Negative for heartburn, nausea, vomiting, abdominal pain, diarrhea, constipation, blood in stool and melena.  Genitourinary: Negative for dysuria, urgency, frequency and hematuria.  Musculoskeletal: Negative for myalgias, joint pain and falls.  Skin: Negative for itching and rash.  Neurological: Positive for headaches. Negative for dizziness, tingling, tremors, sensory change, speech change, focal weakness, seizures, loss of consciousness, weakness. Endo/Heme/Allergies: Does not bruise/bleed easily.  Psychiatric/Behavioral: Negative for depression, suicidal ideas, memory loss and substance  abuse. The patient is not nervous/anxious and does not have insomnia.   14 point review of systems was performed and is negative except as detailed under history of present illness and above  PHYSICAL EXAMINATION: ECOG PERFORMANCE STATUS: 1 - Symptomatic but completely ambulatory  Filed Vitals:   02/20/15 1314  BP: 136/88  Pulse: 93  Temp: 98 F (36.7 C)  Resp: 16   Filed Weights   02/20/15 1314  Weight: 255 lb 1.6 oz (115.713 kg)    Physical Exam  Constitutional: She is oriented to person, place, and time and well-developed, well-nourished, and in no distress.  HENT:  Head: Normocephalic and atraumatic.  Nose: Nose normal.  Mouth/Throat: Oropharynx is clear and moist. No oropharyngeal exudate.  Eyes: Conjunctivae and EOM are normal. Pupils are equal, round, and reactive to light. Right eye exhibits no discharge. Left eye exhibits no discharge. No scleral icterus.  Neck: Normal range of motion. Neck supple. No tracheal deviation present. No thyromegaly present.  Cardiovascular: Normal rate, regular rhythm and normal heart sounds.  Exam reveals no gallop and no friction rub.  No murmur heard. Pulmonary/Chest: Effort normal and breath sounds normal. She has no wheezes. She has no rales.  Abdominal: Soft. Bowel sounds are normal. She exhibits no distension and no mass. There is no tenderness. There is no rebound and no guarding.  Musculoskeletal: Normal range of motion. She exhibits no edema.  Lymphadenopathy:    She has no cervical adenopathy.  Neurological: She is alert and oriented to person, place, and time. She has normal reflexes. No cranial nerve deficit. Gait normal. Coordination normal.  Skin: Skin is warm and dry. No rash noted.  Psychiatric: Mood, memory, affect and judgment normal.  Nursing note and vitals reviewed.   LABORATORY DATA:  I have reviewed the data as listed CBC    Component Value Date/Time   WBC 8.4 01/29/2015 0852   RBC 4.04 01/29/2015 0852   RBC  4.36 11/16/2014 1214   HGB 12.3 01/29/2015 0852   HCT 37.6 01/29/2015 0852   PLT 185 01/29/2015 0852   MCV 93.1 01/29/2015 0852   MCH 30.4 01/29/2015 0852   MCHC 32.7 01/29/2015 0852   RDW 15.3 01/29/2015 0852   LYMPHSABS 2.7 01/29/2015 0852   MONOABS 0.8 01/29/2015 0852   EOSABS 0.1 01/29/2015 0852   BASOSABS 0.0 01/29/2015 0852    CMP     Component Value Date/Time   NA 139 01/29/2015 0852   K 3.8 01/29/2015 2202  CL 105 01/29/2015 0852   CO2 24 01/29/2015 0852   GLUCOSE 134* 01/29/2015 0852   BUN 12 01/29/2015 0852   CREATININE 0.75 01/29/2015 0852   CALCIUM 8.9 01/29/2015 0852   PROT 7.5 01/29/2015 0852   ALBUMIN 3.5 01/29/2015 0852   AST 19 01/29/2015 0852   ALT 16 01/29/2015 0852   ALKPHOS 80 01/29/2015 0852   BILITOT 0.5 01/29/2015 0852   GFRNONAA >60 01/29/2015 0852   GFRAA >60 01/29/2015 0852   CEA is pending  ASSESSMENT & PLAN:  Stage IV colorectal cancer with pulmonary and liver metastases Severe nausea and vomiting after FOLFOX therapy Right upper extremity DVT on XARELTO Headaches   Pleasant 54 year old female with stage IV colorectal cancer.  At times the patient seems to be very accepting of her disease and prognosis and other times she is very difficult time addressing it. We will review the results of her CT imaging and I discussed with her there is evidence of progression in the chest only. She remains asymptomatic. She currently has very good quality of life and is very active.   She would like to take a few more weeks off of treatment until she gets settled into her new apartment. I advised her that this point that is not unreasonable. We discussed other treatment options briefly today. She would like to address this more at follow-up.  She is well aware of risks associated with taking breaks in treatment. She states it is a decision that she has thought about for some time.  Given the frequency of her headaches I recommended CT of the head. We will  repeat CT imaging of the chest in approximately 6-8 weeks and regroup at that point. I will notify her of the results of her CT of the head when they are available.  Xarelto was refilled today.  All questions were answered. The patient knows to call the clinic with any problems, questions or concerns.  This note was electronically signed.    This document serves as a record of services personally performed by Ancil Linsey, MD. It was created on her behalf by Arlyce Harman, a trained medical scribe. The creation of this record is based on the scribe's personal observations and the provider's statements to them. This document has been checked and approved by the attending provider.  I have reviewed the above documentation for accuracy and completeness, and I agree with the above.  Kelby Fam. Whitney Muse, MD

## 2015-02-20 NOTE — Patient Instructions (Signed)
[place] at [facility] Discharge Instructions  RECOMMENDATIONS MADE BY THE CONSULTANT AND ANY TEST RESULTS WILL BE SENT TO YOUR REFERRING PHYSICIAN.  We will get a CT scan of your head since you have been having headaches and we will call with results.  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   Options: 1- chemo holiday -- take a few weeks off treatment -- do scans -- and determine whether we need to start next treatment or if you can take a few more weeks off  2- start chemo - Irinotecan (camptosar) + 5FU. Side effects of Irinotecan is diarrhea, abdominal cramps. This drug will be given every 14 days.  Down the road we will be able to access drugs used in clinical trials @ [facility]. The trials will be available to Erica Barker).  If you begin to have a cough, worsening cough, decrease in activity level, decrease in appetite, etc --- you must CALL us!!!  Since currently you want to take a few weeks off, we will allow you to do that, and then start you on Irinotecan + 5FU.  We will give you approximately 6 weeks off and do another CT Chest, Abdomen, Pelvis and see Erica [facility] after scans.    Thank you for choosing [place] at [facility] to provide your oncology and hematology care.  To afford each patient quality time with our provider, please arrive at least 15 minutes before your scheduled appointment time.    You need to re-schedule your appointment should you arrive 10 or more minutes late.  We strive to give you quality time with our providers, and arriving late affects you and other patients whose appointments are after yours.  Also, if you no show three or more times for appointments you may be dismissed from the clinic at the providers discretion.     Again, thank you for choosing [facility].  Our hope is that these requests  will decrease the amount of time that you wait before being seen by our physicians.       _____________________________________________________________  Should you have questions after your visit to [facility], please contact our office at [phone] between the hours of 8:30 a.m. and 4:30 p.m.  Voicemails left after 4:30 p.m. will not be returned until the following business day.  For prescription refill requests, have your pharmacy contact our office.   Irinotecan injection What is this medicine? IRINOTECAN (ir in oh TEE kan ) is a chemotherapy drug. It is used to treat colon and rectal cancer. This medicine may be used for other purposes; ask your health care provider or pharmacist if you have questions. COMMON BRAND NAME(S): Camptosar What should I tell my health care provider before I take this medicine? They need to know if you have any of these conditions: -blood disorders -dehydration -diarrhea -infection (especially a virus infection such as chickenpox, cold sores, or herpes) -liver disease -low blood counts, like low white cell, platelet, or red cell counts -recent or ongoing radiation therapy -an unusual or allergic reaction to irinotecan, sorbitol, other chemotherapy, other medicines, foods, dyes, or preservatives -pregnant or trying to get pregnant -breast-feeding How should I use this medicine? This drug is given as an infusion into a vein. It is administered in a hospital or clinic by a specially trained health care professional. Talk  to your pediatrician regarding the use of this medicine in children. Special care may be needed. Overdosage: If you think you have taken too much of this medicine contact a poison control [facility] or emergency room at once. NOTE: This medicine is only for you. Do not share this medicine with others. What if I miss a dose? It is important not to miss your dose. Call your doctor or health care professional if you are unable to  keep an appointment. What may interact with this medicine? Do not take this medicine with any of the following medications: -atazanavir -certain medicines for fungal infections like itraconazole and ketoconazole -St. John's Wort This medicine may also interact with the following medications: -dexamethasone -diuretics -laxatives -medicines for seizures like carbamazepine, mephobarbital, phenobarbital, phenytoin, primidone -medicines to increase blood counts like filgrastim, pegfilgrastim, sargramostim -prochlorperazine -vaccines This list may not describe all possible interactions. Give your health care provider a list of all the medicines, herbs, non-prescription drugs, or dietary supplements you use. Also tell them if you smoke, drink alcohol, or use illegal drugs. Some items may interact with your medicine. What should I watch for while using this medicine? Your condition will be monitored carefully while you are receiving this medicine. You will need important blood work done while you are taking this medicine. This drug may make you feel generally unwell. This is not uncommon, as chemotherapy can affect healthy cells as well as cancer cells. Report any side effects. Continue your course of treatment even though you feel ill unless your doctor tells you to stop. In some cases, you may be given additional medicines to help with side effects. Follow all directions for their use. You may get drowsy or dizzy. Do not drive, use machinery, or do anything that needs mental alertness until you know how this medicine affects you. Do not stand or sit up quickly, especially if you are an older patient. This reduces the risk of dizzy or fainting spells. Call your doctor or health care professional for advice if you get a fever, chills or sore throat, or other symptoms of a cold or flu. Do not treat yourself. This drug decreases your body's ability to fight infections. Try to avoid being around people who  are sick. This medicine may increase your risk to bruise or bleed. Call your doctor or health care professional if you notice any unusual bleeding. Be careful brushing and flossing your teeth or using a toothpick because you may get an infection or bleed more easily. If you have any dental work done, tell your dentist you are receiving this medicine. Avoid taking products that contain aspirin, acetaminophen, ibuprofen, naproxen, or ketoprofen unless instructed by your doctor. These medicines may hide a fever. Do not become pregnant while taking this medicine. Women should inform their doctor if they wish to become pregnant or think they might be pregnant. There is a potential for serious side effects to an unborn child. Talk to your health care professional or pharmacist for more information. Do not breast-feed an infant while taking this medicine. What side effects may I notice from receiving this medicine? Side effects that you should report to your doctor or health care professional as soon as possible: -allergic reactions like skin rash, itching or hives, swelling of the face, lips, or tongue -low blood counts - this medicine may decrease the number of white blood cells, red blood cells and platelets. You may be at increased risk for infections and bleeding. -signs of infection - fever  or chills, cough, sore throat, pain or difficulty passing urine -signs of decreased platelets or bleeding - bruising, pinpoint red spots on the skin, black, tarry stools, blood in the urine -signs of decreased red blood cells - unusually weak or tired, fainting spells, lightheadedness -breathing problems -chest pain -diarrhea -feeling faint or lightheaded, falls -flushing, runny nose, sweating during infusion -mouth sores or pain -pain, swelling, redness or irritation where injected -pain, swelling, warmth in the leg -pain, tingling, numbness in the hands or feet -problems with balance, talking,  walking -stomach cramps, pain -trouble passing urine or change in the amount of urine -vomiting as to be unable to hold down drinks or food -yellowing of the eyes or skin Side effects that usually do not require medical attention (report to your doctor or health care professional if they continue or are bothersome): -constipation -hair loss -headache -loss of appetite -nausea, vomiting -stomach upset This list may not describe all possible side effects. Call your doctor for medical advice about side effects. You may report side effects to FDA at 1-800-FDA-1088. Where should I keep my medicine? This drug is given in a hospital or clinic and will not be stored at home. NOTE: This sheet is a summary. It may not cover all possible information. If you have questions about this medicine, talk to your doctor, pharmacist, or health care provider.  2015, [employer]. (2012-12-27 16:29:32)

## 2015-02-21 ENCOUNTER — Inpatient Hospital Stay (HOSPITAL_COMMUNITY)

## 2015-02-22 ENCOUNTER — Encounter (HOSPITAL_COMMUNITY): Payer: Self-pay | Admitting: Hematology & Oncology

## 2015-02-23 ENCOUNTER — Encounter (HOSPITAL_COMMUNITY)

## 2015-02-26 ENCOUNTER — Other Ambulatory Visit (HOSPITAL_COMMUNITY)

## 2015-02-26 ENCOUNTER — Encounter (HOSPITAL_BASED_OUTPATIENT_CLINIC_OR_DEPARTMENT_OTHER)

## 2015-02-26 ENCOUNTER — Encounter (HOSPITAL_COMMUNITY): Payer: Self-pay

## 2015-02-26 ENCOUNTER — Ambulatory Visit (HOSPITAL_COMMUNITY)
Admission: RE | Admit: 2015-02-26 | Discharge: 2015-02-26 | Disposition: A | Discharge status: Home / Self Care | Source: Ambulatory Visit | Attending: Hematology & Oncology | Admitting: Hematology & Oncology

## 2015-02-26 DIAGNOSIS — C787 Secondary malignant neoplasm of liver and intrahepatic bile duct: Secondary | ICD-10-CM | POA: Insufficient documentation

## 2015-02-26 DIAGNOSIS — R51 Headache: Secondary | ICD-10-CM | POA: Diagnosis not present

## 2015-02-26 DIAGNOSIS — C189 Malignant neoplasm of colon, unspecified: Secondary | ICD-10-CM | POA: Insufficient documentation

## 2015-02-26 DIAGNOSIS — C7800 Secondary malignant neoplasm of unspecified lung: Secondary | ICD-10-CM

## 2015-02-26 LAB — BASIC METABOLIC PANEL
ANION GAP: 9 (ref 5–15)
BUN: 12 mg/dL (ref 6–20)
CO2: 26 mmol/L (ref 22–32)
Calcium: 9.3 mg/dL (ref 8.9–10.3)
Chloride: 105 mmol/L (ref 101–111)
Creatinine, Ser: 0.7 mg/dL (ref 0.44–1.00)
GFR calc Af Amer: >60 mL/min (ref >60)
GFR calc non Af Amer: >60 mL/min (ref >60)
GLUCOSE: 124 mg/dL — AB (ref 65–99)
Potassium: 3.7 mmol/L (ref 3.5–5.1)
Sodium: 140 mmol/L (ref 135–145)

## 2015-02-26 MED ORDER — HEPARIN SOD (PORK) LOCK FLUSH 100 UNIT/ML IV SOLN
500.0000 [IU] | Freq: Once | INTRAVENOUS | Status: AC
  Administered 2015-02-26: 500 [IU] via INTRAVENOUS

## 2015-02-26 MED ORDER — HEPARIN SOD (PORK) LOCK FLUSH 100 UNIT/ML IV SOLN
INTRAVENOUS | Status: AC
  Filled 2015-02-26: qty 5

## 2015-02-26 MED ORDER — IOHEXOL 300 MG/ML  SOLN
75.0000 mL | Freq: Once | INTRAMUSCULAR | Status: AC | PRN
  Administered 2015-02-26: 75 mL via INTRAVENOUS

## 2015-02-26 MED ORDER — SODIUM CHLORIDE 0.9 % IJ SOLN
10.0000 mL | INTRAMUSCULAR | Status: DC | PRN
  Administered 2015-02-26: 10 mL via INTRAVENOUS
  Filled 2015-02-26: qty 10

## 2015-02-26 NOTE — Patient Instructions (Signed)
[place] at [facility] Discharge Instructions  RECOMMENDATIONS MADE BY THE CONSULTANT AND ANY TEST RESULTS WILL BE SENT TO YOUR REFERRING PHYSICIAN.  Port access and flush for CT scan, blood work Follow up as scheduled Please call the clinic if you have any questions or concerns  Thank you for choosing [place] at [facility] to provide your oncology and hematology care.  To afford each patient quality time with our provider, please arrive at least 15 minutes before your scheduled appointment time.    You need to re-schedule your appointment should you arrive 10 or more minutes late.  We strive to give you quality time with our providers, and arriving late affects you and other patients whose appointments are after yours.  Also, if you no show three or more times for appointments you may be dismissed from the clinic at the providers discretion.     Again, thank you for choosing [facility].  Our hope is that these requests will decrease the amount of time that you wait before being seen by our physicians.       _____________________________________________________________  Should you have questions after your visit to [facility], please contact our office at [phone] between the hours of 8:30 a.m. and 4:30 p.m.  Voicemails left after 4:30 p.m. will not be returned until the following business day.  For prescription refill requests, have your pharmacy contact our office.

## 2015-02-26 NOTE — Progress Notes (Signed)
Erica [facility] presented for Portacath access and flush.  Proper placement of portacath confirmed by CXR.  Portacath located right chest wall accessed with  H 20 needle.  Good blood return present. Portacath flushed with 25m NS and 500U/557mHeparin and needle removed intact.  Procedure tolerated well and without incident.

## 2015-03-27 ENCOUNTER — Other Ambulatory Visit (HOSPITAL_COMMUNITY): Payer: Self-pay | Admitting: Hematology & Oncology

## 2015-04-02 ENCOUNTER — Encounter (HOSPITAL_COMMUNITY): Attending: Hematology & Oncology

## 2015-04-02 ENCOUNTER — Ambulatory Visit (HOSPITAL_COMMUNITY): Admission: RE | Admit: 2015-04-02 | Source: Ambulatory Visit

## 2015-04-02 ENCOUNTER — Other Ambulatory Visit (HOSPITAL_COMMUNITY): Payer: Self-pay | Admitting: Hematology & Oncology

## 2015-04-02 ENCOUNTER — Other Ambulatory Visit (HOSPITAL_COMMUNITY)

## 2015-04-02 ENCOUNTER — Ambulatory Visit (HOSPITAL_COMMUNITY)
Admission: RE | Admit: 2015-04-02 | Discharge: 2015-04-02 | Disposition: A | Discharge status: Home / Self Care | Source: Ambulatory Visit | Attending: Hematology & Oncology | Admitting: Hematology & Oncology

## 2015-04-02 DIAGNOSIS — Z08 Encounter for follow-up examination after completed treatment for malignant neoplasm: Secondary | ICD-10-CM | POA: Diagnosis not present

## 2015-04-02 DIAGNOSIS — Z452 Encounter for adjustment and management of vascular access device: Secondary | ICD-10-CM | POA: Diagnosis not present

## 2015-04-02 DIAGNOSIS — R932 Abnormal findings on diagnostic imaging of liver and biliary tract: Secondary | ICD-10-CM | POA: Insufficient documentation

## 2015-04-02 DIAGNOSIS — C787 Secondary malignant neoplasm of liver and intrahepatic bile duct: Secondary | ICD-10-CM | POA: Diagnosis not present

## 2015-04-02 DIAGNOSIS — R599 Enlarged lymph nodes, unspecified: Secondary | ICD-10-CM | POA: Insufficient documentation

## 2015-04-02 DIAGNOSIS — C7800 Secondary malignant neoplasm of unspecified lung: Secondary | ICD-10-CM | POA: Diagnosis not present

## 2015-04-02 DIAGNOSIS — C189 Malignant neoplasm of colon, unspecified: Secondary | ICD-10-CM | POA: Insufficient documentation

## 2015-04-02 DIAGNOSIS — C187 Malignant neoplasm of sigmoid colon: Secondary | ICD-10-CM

## 2015-04-02 MED ORDER — OXYCODONE-ACETAMINOPHEN 5-325 MG PO TABS: 1.0000 | ORAL_TABLET | ORAL | Status: DC | PRN

## 2015-04-02 MED ORDER — HEPARIN SOD (PORK) LOCK FLUSH 100 UNIT/ML IV SOLN
INTRAVENOUS | Status: AC
  Filled 2015-04-02: qty 5

## 2015-04-02 MED ORDER — SODIUM CHLORIDE 0.9 % IJ SOLN
10.0000 mL | INTRAMUSCULAR | Status: DC | PRN
  Administered 2015-04-02: 10 mL via INTRAVENOUS
  Filled 2015-04-02: qty 10

## 2015-04-02 MED ORDER — IOHEXOL 300 MG/ML  SOLN
100.0000 mL | Freq: Once | INTRAMUSCULAR | Status: AC | PRN
  Administered 2015-04-02: 100 mL via INTRAVENOUS

## 2015-04-02 MED ORDER — HEPARIN SOD (PORK) LOCK FLUSH 100 UNIT/ML IV SOLN
500.0000 [IU] | Freq: Once | INTRAVENOUS | Status: AC
  Administered 2015-04-02: 500 [IU] via INTRAVENOUS

## 2015-04-02 NOTE — Progress Notes (Signed)
Velora Mediate presented for Portacath access and flush. Proper placement of portacath confirmed by CXR. Portacath located right chest wall accessed with  PH 20 needle.  Left accessed for CT scan. Good blood return present. Portacath flushed with 73m NS and 500U/534mHeparin and needle removed intact after patient returned to floor from CT. Procedure without incident. Patient tolerated procedure well.

## 2015-04-04 ENCOUNTER — Encounter (HOSPITAL_COMMUNITY): Payer: Self-pay | Admitting: Hematology & Oncology

## 2015-04-04 ENCOUNTER — Encounter (HOSPITAL_COMMUNITY)

## 2015-04-04 ENCOUNTER — Encounter (HOSPITAL_BASED_OUTPATIENT_CLINIC_OR_DEPARTMENT_OTHER): Admitting: Hematology & Oncology

## 2015-04-04 VITALS — BP 149/101 | HR 72 | Temp 98.3°F | Resp 18 | Wt 252.6 lb

## 2015-04-04 DIAGNOSIS — C189 Malignant neoplasm of colon, unspecified: Secondary | ICD-10-CM

## 2015-04-04 DIAGNOSIS — C7800 Secondary malignant neoplasm of unspecified lung: Secondary | ICD-10-CM

## 2015-04-04 MED ORDER — DIPHENOXYLATE-ATROPINE 2.5-0.025 MG PO TABS: 1.0000 | ORAL_TABLET | ORAL | Status: DC | PRN

## 2015-04-04 NOTE — Progress Notes (Signed)
[place] Progress Note  Patient Care Team: No Pcp Per Patient as PCP - General ([place]) Melrose Nakayama, MD as Consulting Physician (Cardiothoracic Surgery)   CHIEF COMPLAINTS/PURPOSE OF CONSULTATION:  Stage IV CRC CT of abdomen and pelvis on 02/20/2014 at [facility] showing 3.6 cm apple core lesion in the proximal sigmoid colon 11 mm hypoenhancing lesion in the medial left hepatic dome, 4.1 cm right lower lobe mass and adjacent right lower lobe nodule CEA on 08/18/2014 of 93.7 ng/ml Right upper extremity DVT 04/17/2014 paired brachial veins, axillary vein, and peripheral aspect of the right subclavian vein CT chest 07/06/2014 with bilateral pulmonary nodules and masses, RUL, lateral RUL, posterior LUL, lobulated nodule in the subpleural RLL,     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   02/20/2014 Imaging CT of abdomen and pelvis on 02/20/2014 at [facility] showing 3.6 cm apple core lesion in the proximal sigmoid colon 11 mm hypoenhancing lesion in the medial left hepatic dome, 4.1 cm right lower lobe mass and adjacent right lower lobe nodule   04/17/2014 Imaging Right upper extremity DVT 04/17/2014 paired brachial veins, axillary vein, and peripheral aspect of the right subclavian vein   07/06/2014 Imaging CT chest 07/06/2014 with bilateral pulmonary nodules and masses, RUL, lateral RUL, posterior LUL, lobulated nodule in the subpleural RLL   08/18/2014 Tumor Marker CEA 93.7 ng/ml   10/09/2014 Initial Diagnosis 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   02/19/2015 Imaging Progressive pulmonary metastatic disease as detailed above. Stable mediastinal lymphadenopathy. No abdominal/pelvic metastatic disease is identified    HISTORY OF PRESENTING ILLNESS:   Erica Barker 54 y.o. female is here because of stage IV  colon cancer. She is here today to review recent CT scans.   Erica Barker is here alone today. She is an incredibly pleasant woman, often smiling and laughing. She is very excited about her new apartment.  She says she's been doing okay, aside from some lack of energy lately.  She denies any new coughing or problems breathing. She says her bowels have been okay, without any blood in her stool and without any trouble passing them.  She has a good support system around, and feels willing to pursue further treatment of her cancer. Her questions mainly revolve around the nature of continued treatment. She says she would be willing to start her new therapy next week.  Erica Barker remarks that she's been having terrible headaches lately, about twice a month. The headaches last about a day and a half, and then go away. She says other cancer survivors have told her "that's just part of being treated with chemo." We did a head CT in August that was negative. She denies blurry vision, change in speech or other concerning symptoms.  She has very watery eyes, and states that this happens all the time.  At the end of the exam, she remarks that she experiences some pain in her right arm, "where the blood clot was." She says she's afraid of being on Xarelto because of all of the side effects she sees on TV. She notes that she still takes it daily however. She has no problems with bleeding or bruising.   MEDICAL HISTORY:  Past Medical History  Diagnosis Date  . Hypertension   . Depression   . Pulmonary nodules 01/30/14    CTA CHEST  . Lymphadenopathy,  mediastinal 01/2014    [place]  . Morbid obesity   . Diabetes mellitus, type II   . Headache(784.0)     SURGICAL HISTORY: Past Surgical History  Procedure Laterality Date  . Cholecystectomy    . Video bronchoscopy with endobronchial ultrasound N/A 02/06/2014    Procedure: VIDEO BRONCHOSCOPY WITH ENDOBRONCHIAL ULTRASOUND,bronchial  biopsies , node sampling;  Surgeon: Melrose Nakayama, MD;  Location: [facility];  Service: Thoracic;  Laterality: N/A;  . Tubal ligation      SOCIAL HISTORY: Social History   Social History  . Marital Status: Married    Spouse Name: N/A  . Number of Children: N/A  . Years of Education: N/A   Occupational History  . Not on file.   Social History Main Topics  . Smoking status: Former Smoker -- 0.50 packs/day for 7 years    Types: Cigarettes    Quit date: 02/03/1989  . Smokeless tobacco: Never Used  . Alcohol Use: Yes     Comment: occasional  . Drug Use: No  . Sexual Activity: Not on file   Other Topics Concern  . Not on file   Social History Narrative  She is in a relationship. 3 children. Aged 32,35,20.  She has no grandchildren. She smoked in high school but quit after graduation. No ETOH. 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  She is originally from [place].  FAMILY HISTORY: Family History  Problem Relation Age of Onset  . Cancer Mother     UTERINE  . Cancer Father     LEUKEMIA  . Crohn's disease Son    indicated that her mother is alive. She indicated that her father is deceased. She indicated that her son is alive.   Father is deceased at age 92 from leukemia, mother is alive at 89 with a history of uterine cancer.  She has one sister how is healthy and one brother who is healthy. He works as a Engineer, structural.   ALLERGIES:  is allergic to asa.  MEDICATIONS:  Current Outpatient Prescriptions  Medication Sig Dispense Refill  . aspirin-acetaminophen-caffeine (EXCEDRIN MIGRAINE) 250-250-65 MG per tablet Take 2 tablets by mouth every 6 (six) hours as needed for headache.    . escitalopram (LEXAPRO) 20 MG tablet Take 1/2 tablet by mouth daily for 5 days then increase to one tablet daily 30 tablet 6  . IRINOTECAN HCL IV Inject into the vein every 14 (fourteen) days.    Marland Kitchen lisinopril (PRINIVIL,ZESTRIL) 10 MG tablet Take 1  tablet (10 mg total) by mouth daily. 30 tablet 6  . Multiple Vitamin (MULTIVITAMIN) capsule Take 1 capsule by mouth daily.    Marland Kitchen oxyCODONE-acetaminophen (PERCOCET/ROXICET) 5-325 MG per tablet Take 1 tablet by mouth every 4 (four) hours as needed for severe pain. 60 tablet 0  . pantoprazole (PROTONIX) 40 MG tablet Take 1 tablet (40 mg total) by mouth daily. 30 tablet 3  . rivaroxaban (XARELTO) 20 MG TABS tablet Take 1 tablet (20 mg total) by mouth daily with supper. 30 tablet 3  . capecitabine (XELODA) 500 MG tablet Take 4 tablets twice daily for 14 days on, then take a seven day break and restart 112 tablet 6  . Diphenhyd-Hydrocort-Nystatin (FIRST-DUKES MOUTHWASH) SUSP Use as directed 5 mLs in the mouth or throat 4 (four) times daily as needed. (Patient not taking: Reported on 04/04/2015) 300 mL 2  . diphenoxylate-atropine (LOMOTIL) 2.5-0.025 MG per tablet Take  1 tablet by mouth every 4 (four) hours as needed for diarrhea or loose stools. 45 tablet 0  . fluorouracil CALGB 84166 in sodium chloride 0.9 % 150 mL Inject into the vein. To infuse over 46 hours every 14 days    . LEUCOVORIN CALCIUM IV Inject into the vein every 14 (fourteen) days.    . metFORMIN (GLUCOPHAGE) 500 MG tablet Take 500 mg by mouth daily with breakfast.     . promethazine (PHENERGAN) 25 MG suppository Place 1 suppository (25 mg total) rectally every 6 (six) hours as needed for nausea or vomiting. (Patient not taking: Reported on 04/04/2015) 12 each 0   No current facility-administered medications for this visit.    Review of Systems  Constitutional: Negative for fever, chills, weight loss and malaise/fatigue.  HENT: Negative for congestion, hearing loss, nosebleeds, sore throat and tinnitus.   Eyes: Negative for blurred vision, double vision, pain and discharge.  Respiratory: Negative for cough, hemoptysis, sputum production, shortness of breath and wheezing.   Cardiovascular: Negative for chest pain, palpitations,  claudication, leg swelling and PND.  Gastrointestinal: Negative for heartburn, nausea, vomiting, abdominal pain, diarrhea, constipation, blood in stool and melena.  Genitourinary: Negative for dysuria, urgency, frequency and hematuria.  Musculoskeletal: Negative for myalgias, joint pain and falls.  Skin: Negative for itching and rash.  Neurological: Positive for headaches. Negative for dizziness, tingling, tremors, sensory change, speech change, focal weakness, seizures, loss of consciousness, weakness. Endo/Heme/Allergies: Does not bruise/bleed easily.  Psychiatric/Behavioral: Negative for depression, suicidal ideas, memory loss and substance abuse. The patient is not nervous/anxious and does not have insomnia.   14 point review of systems was performed and is negative except as detailed under history of present illness and above   PHYSICAL EXAMINATION: ECOG PERFORMANCE STATUS: 1 - Symptomatic but completely ambulatory  Filed Vitals:   04/04/15 1345  BP: 149/101  Pulse: 72  Temp: 98.3 F (36.8 C)  Resp: 18   Filed Weights   04/04/15 1345  Weight: 252 lb 9.6 oz (114.579 kg)    Physical Exam  Constitutional: She is oriented to person, place, and time and well-developed, well-nourished, and in no distress.  HENT:  Head: Normocephalic and atraumatic.  Nose: Nose normal.  Mouth/Throat: Oropharynx is clear and moist. No oropharyngeal exudate.  Eyes: Conjunctivae and EOM are normal. Pupils are equal, round, and reactive to light. Right eye exhibits no discharge. Left eye exhibits no discharge. No scleral icterus.   Her eyes are watering. Neck: Normal range of motion. Neck supple. No tracheal deviation present. No thyromegaly present.  Cardiovascular: Normal rate, regular rhythm and normal heart sounds.  Exam reveals no gallop and no friction rub.  No murmur heard. Pulmonary/Chest: Effort normal and breath sounds normal. She has no wheezes. She has no rales.  Abdominal: Soft. Bowel  sounds are normal. She exhibits no distension and no mass. There is no tenderness. There is no rebound and no guarding.  Musculoskeletal: Normal range of motion. She exhibits no edema.  Lymphadenopathy:    She has no cervical adenopathy.  Neurological: She is alert and oriented to person, place, and time. She has normal reflexes. No cranial nerve deficit. Gait normal. Coordination normal.  Skin: Skin is warm and dry. No rash noted.  Psychiatric: Mood, memory, affect and judgment normal.  Nursing note and vitals reviewed.   LABORATORY DATA:  I have reviewed the data as listed.  CBC    Component Value Date/Time   WBC 8.4 01/29/2015 0852   RBC  4.04 01/29/2015 0852   RBC 4.36 11/16/2014 1214   HGB 12.3 01/29/2015 0852   HCT 37.6 01/29/2015 0852   PLT 185 01/29/2015 0852   MCV 93.1 01/29/2015 0852   MCH 30.4 01/29/2015 0852   MCHC 32.7 01/29/2015 0852   RDW 15.3 01/29/2015 0852   LYMPHSABS 2.7 01/29/2015 0852   MONOABS 0.8 01/29/2015 0852   EOSABS 0.1 01/29/2015 0852   BASOSABS 0.0 01/29/2015 0852    CMP     Component Value Date/Time   NA 140 02/26/2015 1200   K 3.7 02/26/2015 1200   CL 105 02/26/2015 1200   CO2 26 02/26/2015 1200   GLUCOSE 124* 02/26/2015 1200   BUN 12 02/26/2015 1200   CREATININE 0.70 02/26/2015 1200   CALCIUM 9.3 02/26/2015 1200   PROT 7.5 01/29/2015 0852   ALBUMIN 3.5 01/29/2015 0852   AST 19 01/29/2015 0852   ALT 16 01/29/2015 0852   ALKPHOS 80 01/29/2015 0852   BILITOT 0.5 01/29/2015 0852   GFRNONAA >60 02/26/2015 1200   GFRAA >60 02/26/2015 1200   RADIOGRAPHIC STUDIES  Study Result     CLINICAL DATA: Subsequent encounter for colon cancer diagnoses 2015 with lung metastases.  EXAM: CT CHEST, ABDOMEN, AND PELVIS WITH CONTRAST  TECHNIQUE: Multidetector CT imaging of the chest, abdomen and pelvis was performed following the standard protocol during bolus administration of intravenous contrast.  CONTRAST: 17m OMNIPAQUE IOHEXOL  300 MG/ML SOLN  COMPARISON: 02/19/2015  FINDINGS: CT CHEST FINDINGS  Mediastinum/Lymph Nodes: There is no axillary lymphadenopathy. 10 mm short axis precarinal lymph node measured previously is stable at 9 mm 10 mm. 18 mm short axis subcarinal lymph node on the previous study is now 19 mm. No evidence for hilar lymphadenopathy. The heart size is normal. No pericardial effusion. The esophagus has normal CT imaging features.  Lungs/Pleura: 16 x 14 mm right upper lobe index lesion on the previous study now measures 17 x 14 mm.  Index nodule in the left upper lobe measured previously at 10 x 10 mm now measures 11 x 14 mm.  A second index nodule in the right upper lobe was measured previously at 20 x 17 mm compared to 23 x 18 mm today.  Dominant lung lesion is in the right lower lobe and was measured previously at 3.9 x 4.2 cm which compares to 4.1 x 4.7 cm today.  Numerous other bilateral pulmonary nodules are evident. No focal airspace consolidation. No pulmonary edema or pleural effusion.  Musculoskeletal: Bone windows reveal no worrisome lytic or sclerotic osseous lesions.  CT ABDOMEN PELVIS FINDINGS  Hepatobiliary: 7 mm very subtle hypo attenuating focus is identified in the dome of the right liver (image 36 series 2). This is too small to definitively characterize. 9 mm hypo attenuating lesion is seen at the extreme inferior tip of the right liver and appears to be new in the interval. A third tiny hypo attenuating focus is seen in the right liver on image 53 of series 2, also apparently new in the interval. Gallbladder is surgically absent. No intrahepatic or extrahepatic biliary dilation.  Pancreas: No focal mass lesion. No dilatation of the main duct. No intraparenchymal cyst. No peripancreatic edema.  Spleen: No splenomegaly. No focal mass lesion.  Adrenals/Urinary Tract: No adrenal nodule or mass. Kidneys are normal in appearance bilaterally. No  evidence for hydroureter. The urinary bladder appears normal for the degree of distention.  Stomach/Bowel: Stomach is nondistended. No gastric wall thickening. No evidence of outlet obstruction. Duodenum is  normally positioned as is the ligament of Treitz. No small bowel wall thickening. No small bowel dilatation. The terminal ileum is normal. The appendix is normal. A few scattered diverticuli are seen in the colon. There is an area of wall thickening identified in the mid sigmoid colon (see image 87 series 2).  Vascular/Lymphatic: No abdominal aortic aneurysm. There is no gastrohepatic or hepatoduodenal ligament lymphadenopathy. No intraperitoneal or retroperitoneal lymphadenopy.  Multiple lymph nodes are seen in the sigmoid mesocolon, adjacent to the area of wall thickening. Imaging features are highly suspicious for metastatic nodal spread.  Reproductive: The uterus has normal CT imaging appearance. There is no adnexal mass.  Other: No intraperitoneal free fluid.  Musculoskeletal: Bone windows reveal no worrisome lytic or sclerotic osseous lesions.  IMPRESSION: 1. Mild interval progression of bilateral pulmonary metastases. 2. No substantial interval change in mediastinal lymphadenopathy. 3. Interval development of tiny, very subtle low-density foci in the liver parenchyma. This raises concern for interval development of hepatic metastases. Liver MRI without and with contrast could be used to further assess, as clinically warranted. 4. Apparent wall thickening in the mid sigmoid colon is more prominent than on the previous study. Adjacent borderline enlarged lymph nodes in the sigmoid mesocolon are suspicious for metastatic disease.   Electronically Signed  By: Misty Stanley M.D.  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  At times the patient seems to be very accepting of her disease and prognosis and other times she is very difficult time addressing it. We will review the results of her CT imaging and I discussed with her there is evidence of progression. She remains asymptomatic. She currently has very good quality of life and is very active.   I have recommended second line therapy with FOLFIRI and Avastin. She is not sure if she wants to do infusional 5-FU. She had tried Xeloda in the past and had significant problems with nausea and vomiting. I advised her we could try XELIRI/Avastin and make adjustments to her anti-emetics. She could certainly retry XELODA therapy.  I have instructed her to contact me if her intermittent headaches get any worse.  I will set her up for further chemotherapy teaching with [facility].  She does not need refills on her oxycodone at the time.  All questions were answered. The patient knows to call the clinic with any problems, questions or concerns.  This note was electronically signed.    This document serves as a record of services personally performed by Ancil Linsey, MD. It was created on her behalf by Toni Amend, a trained medical scribe. The creation of this record is based on the scribe's personal observations and the provider's statements to them. This document has been checked and approved by the attending provider.  I have reviewed the above documentation for accuracy and completeness, and I agree with the above.  Kelby Fam. Whitney Muse, MD

## 2015-04-04 NOTE — Progress Notes (Signed)
Chemo teaching done with patient today regarding Irinotecan. Information provided to patient on MD visit AVS. Patient just needs to sign consent for Irinotecan on Wed 9/28 when she comes for treatment.

## 2015-04-04 NOTE — Patient Instructions (Signed)
[place] at [facility] Discharge Instructions  RECOMMENDATIONS MADE BY THE CONSULTANT AND ANY TEST RESULTS WILL BE SENT TO YOUR REFERRING PHYSICIAN.   We are starting you on Irinotecan, Leucovorin, and 5FU. You will still wear the pump.  Irinotecan can cause stomach cramps, diarrhea, nausea, vomiting. We will prescribe Lomotil for you but we prefer that you start taking Imodium first for diarrhea.   Over-the-Counter for diarrhea (#1 to take for diarrhea ) Imodium - take 2 tablets after the 1st loose stool and then 1 tablet every 2 hours until you go a total of 12 hours in a row without having a loose stool. During the night if you have diarrhea, take 2 tablets every 4 hours until morning and call the [facility].   Prescription for diarrhea (#2 to take for diarrhea) Lomotil 1 tablet every 4 hours as needed for diarrhea.   We are starting your new chemo on Wed 9/28 @ 9:45.  Fantasy Donald to follow up with you regarding [employer].  Irinotecan is a chemotherapy drug primarily used to treat colon and rectal cancer. This drug like other chemo can cause lowering of blood counts, increased risk of infection, bleeding or bruising more easily, fatigue, hair loss, mouth sores, stomach cramps, diarrhea, and nausea with vomiting. The main symptoms that we look for with this drug are stomach cramps and diarrhea. It is very important that you drink 6-8 glasses of fluids daily while going thru chemo especially if you are having diarrhea. We do not want you to get dehydrated. If the Imodium does not stop diarrhea within 12 hrs please call us at [phone]. If this occurs on the weekend report to the ED.  Thank you for choosing [place] at [facility] to provide your oncology and hematology care.  To afford each patient quality time with our provider, please arrive at least 15 minutes before your scheduled appointment time.    You need to re-schedule  your appointment should you arrive 10 or more minutes late.  We strive to give you quality time with our providers, and arriving late affects you and other patients whose appointments are after yours.  Also, if you no show three or more times for appointments you may be dismissed from the clinic at the providers discretion.     Again, thank you for choosing [facility].  Our hope is that these requests will decrease the amount of time that you wait before being seen by our physicians.       _____________________________________________________________  Should you have questions after your visit to [facility], please contact our office at [phone] between the hours of 8:30 a.m. and 4:30 p.m.  Voicemails left after 4:30 p.m. will not be returned until the following business day.  For prescription refill requests, have your pharmacy contact our office.   Irinotecan injection What is this medicine? IRINOTECAN (ir in oh TEE kan ) is a chemotherapy drug. It is used to treat colon and rectal cancer. This medicine may be used for other purposes; ask your health care provider or pharmacist if you have questions. COMMON BRAND NAME(S): Camptosar What should I tell my health care provider before I take this medicine? They need to know if you have any of these conditions: -blood disorders -dehydration -diarrhea -infection (especially a virus infection such as chickenpox, cold sores, or herpes) -liver disease -low blood counts, like low white cell, platelet, or red  cell counts -recent or ongoing radiation therapy -an unusual or allergic reaction to irinotecan, sorbitol, other chemotherapy, other medicines, foods, dyes, or preservatives -pregnant or trying to get pregnant -breast-feeding How should I use this medicine? This drug is given as an infusion into a vein. It is administered in a hospital or clinic by a specially trained health care professional. Talk to your  pediatrician regarding the use of this medicine in children. Special care may be needed. Overdosage: If you think you have taken too much of this medicine contact a poison control center or emergency room at once. NOTE: This medicine is only for you. Do not share this medicine with others. What if I miss a dose? It is important not to miss your dose. Call your doctor or health care professional if you are unable to keep an appointment. What may interact with this medicine? Do not take this medicine with any of the following medications: -atazanavir -certain medicines for fungal infections like itraconazole and ketoconazole -St. John's Wort This medicine may also interact with the following medications: -dexamethasone -diuretics -laxatives -medicines for seizures like carbamazepine, mephobarbital, phenobarbital, phenytoin, primidone -medicines to increase blood counts like filgrastim, pegfilgrastim, sargramostim -prochlorperazine -vaccines This list may not describe all possible interactions. Give your health care provider a list of all the medicines, herbs, non-prescription drugs, or dietary supplements you use. Also tell them if you smoke, drink alcohol, or use illegal drugs. Some items may interact with your medicine. What should I watch for while using this medicine? Your condition will be monitored carefully while you are receiving this medicine. You will need important blood work done while you are taking this medicine. This drug may make you feel generally unwell. This is not uncommon, as chemotherapy can affect healthy cells as well as cancer cells. Report any side effects. Continue your course of treatment even though you feel ill unless your doctor tells you to stop. In some cases, you may be given additional medicines to help with side effects. Follow all directions for their use. You may get drowsy or dizzy. Do not drive, use machinery, or do anything that needs mental alertness  until you know how this medicine affects you. Do not stand or sit up quickly, especially if you are an older patient. This reduces the risk of dizzy or fainting spells. Call your doctor or health care professional for advice if you get a fever, chills or sore throat, or other symptoms of a cold or flu. Do not treat yourself. This drug decreases your body's ability to fight infections. Try to avoid being around people who are sick. This medicine may increase your risk to bruise or bleed. Call your doctor or health care professional if you notice any unusual bleeding. Be careful brushing and flossing your teeth or using a toothpick because you may get an infection or bleed more easily. If you have any dental work done, tell your dentist you are receiving this medicine. Avoid taking products that contain aspirin, acetaminophen, ibuprofen, naproxen, or ketoprofen unless instructed by your doctor. These medicines may hide a fever. Do not become pregnant while taking this medicine. Women should inform their doctor if they wish to become pregnant or think they might be pregnant. There is a potential for serious side effects to an unborn child. Talk to your health care professional or pharmacist for more information. Do not breast-feed an infant while taking this medicine. What side effects may I notice from receiving this medicine? Side effects that you  should report to your doctor or health care professional as soon as possible: -allergic reactions like skin rash, itching or hives, swelling of the face, lips, or tongue -low blood counts - this medicine may decrease the number of white blood cells, red blood cells and platelets. You may be at increased risk for infections and bleeding. -signs of infection - fever or chills, cough, sore throat, pain or difficulty passing urine -signs of decreased platelets or bleeding - bruising, pinpoint red spots on the skin, black, tarry stools, blood in the urine -signs of  decreased red blood cells - unusually weak or tired, fainting spells, lightheadedness -breathing problems -chest pain -diarrhea -feeling faint or lightheaded, falls -flushing, runny nose, sweating during infusion -mouth sores or pain -pain, swelling, redness or irritation where injected -pain, swelling, warmth in the leg -pain, tingling, numbness in the hands or feet -problems with balance, talking, walking -stomach cramps, pain -trouble passing urine or change in the amount of urine -vomiting as to be unable to hold down drinks or food -yellowing of the eyes or skin Side effects that usually do not require medical attention (report to your doctor or health care professional if they continue or are bothersome): -constipation -hair loss -headache -loss of appetite -nausea, vomiting -stomach upset This list may not describe all possible side effects. Call your doctor for medical advice about side effects. You may report side effects to FDA at 1-800-FDA-1088. Where should I keep my medicine? This drug is given in a hospital or clinic and will not be stored at home. NOTE: This sheet is a summary. It may not cover all possible information. If you have questions about this medicine, talk to your doctor, pharmacist, or health care provider.  2015, Elsevier/Gold Standard. (2012-12-27 16:29:32) Loperamide tablets or capsules What is this medicine? LOPERAMIDE (loe PER a mide) is used to treat diarrhea. This medicine may be used for other purposes; ask your health care provider or pharmacist if you have questions. COMMON BRAND NAME(S): Anti-Diarrheal, Imodium A-D, K-Pek II What should I tell my health care provider before I take this medicine? They need to know if you have any of these conditions: -a black or bloody stool -bacterial food poisoning -colitis or mucus in your stool -currently taking an antibiotic medication for an infection -fever -liver disease -severe abdominal pain,  swelling or bulging -an unusual or allergic reaction to loperamide, other medicines, foods, dyes, or preservatives -pregnant or trying to get pregnant -breast-feeding How should I use this medicine? Take this medicine by mouth with a glass of water. Follow the directions on the prescription label. Take your doses at regular intervals. Do not take your medicine more often than directed. Talk to your pediatrician regarding the use of this medicine in children. Special care may be needed. Overdosage: If you think you have taken too much of this medicine contact a poison control center or emergency room at once. NOTE: This medicine is only for you. Do not share this medicine with others. What if I miss a dose? This does not apply. This medicine is not for regular use. Only take this medicine while you continue to have loose bowel movements. Do not take more medicine than recommended by the packaging label or by your healthcare professional. What may interact with this medicine? Do not take this medicine with any of the following medications: -alosetron This medicine may also interact with the following medications: -quinidine -ritonavir -saquinavir This list may not describe all possible interactions. Give your health  care provider a list of all the medicines, herbs, non-prescription drugs, or dietary supplements you use. Also tell them if you smoke, drink alcohol, or use illegal drugs. Some items may interact with your medicine. What should I watch for while using this medicine? Do not take this medicine for more than 1 week without asking your doctor or health care professional. If your symptoms do not start to get better after two days, you may have a problem that needs further evaluation. Check with your doctor or health care professional right away if you develop a fever, severe abdominal pain, swelling or bulging, or if you have have bloody/black diarrhea or stools. You may get drowsy or  dizzy. Do not drive, use machinery, or do anything that needs mental alertness until you know how this medicine affects you. Do not stand or sit up quickly, especially if you are an older patient. This reduces the risk of dizzy or fainting spells. Alcohol can increase possible drowsiness and dizziness. Avoid alcoholic drinks. Your mouth may get dry. Chewing sugarless gum or sucking hard candy, and drinking plenty of water may help. Contact your doctor if the problem does not go away or is severe. Drinking plenty of water can also help prevent dehydration that can occur with diarrhea. Elderly patients may have a more variable response to the effects of this medicine, and are more susceptible to the effects of dehydration. What side effects may I notice from receiving this medicine? Side effects that you should report to your doctor or health care professional as soon as possible: -allergic reactions like skin rash, itching or hives, swelling of the face, lips, or tongue -bloated, swollen feeling in your abdomen -blurred vision -loss of appetite -stomach pain Side effects that usually do not require medical attention (report to your doctor or health care professional if they continue or are bothersome): -constipation -drowsiness or dizziness -dry mouth -nausea, vomiting This list may not describe all possible side effects. Call your doctor for medical advice about side effects. You may report side effects to FDA at 1-800-FDA-1088. Where should I keep my medicine? Keep out of the reach of children. Store at room temperature between 15 and 25 degrees C (59 and 77 degrees F). Keep container tightly closed. Throw away any unused medicine after the expiration date. NOTE: This sheet is a summary. It may not cover all possible information. If you have questions about this medicine, talk to your doctor, pharmacist, or health care provider.  2015, Elsevier/Gold Standard. (2008-01-04 16:02:13) Atropine;  Diphenoxylate tablets What is this medicine? ATROPINE; DIPHENOXYLATE (A troe peen dye fen OX i late) is used to treat diarrhea. This medicine may be used for other purposes; ask your health care provider or pharmacist if you have questions. COMMON BRAND NAME(S): Lomotil, Lonox, Vi-Atro What should I tell my health care provider before I take this medicine? They need to know if you have any of these conditions: -bacterial food poisoning -colitis -dehydration -Down's syndrome -jaundice or liver disease -an unusual or allergic reaction to atropine, diphenoxylate, other medicines, foods, dyes, or preservatives -pregnant or trying to get pregnant -breast-feeding How should I use this medicine? Take this medicine by mouth with a glass of water. Follow the directions on the prescription label. You can take the tablets with food. Take your doses at regular intervals. Do not take your medicine more often than directed. Once your diarrhea has been brought under control your doctor or health care professional may reduce your doses. Talk to  your pediatrician regarding the use of this medicine in children. Special care may be needed. Elderly patients may be more sensitive to the effects of this medicine. Overdosage: If you think you have taken too much of this medicine contact a poison control center or emergency room at once. NOTE: This medicine is only for you. Do not share this medicine with others. What if I miss a dose? If you miss a dose, take it as soon as you can. If it is almost time for your next dose, take only that dose. Do not take double or extra doses. What may interact with this medicine? -alcohol -antihistamines for allergy, cough and cold -barbiturate medicines for inducing sleep or treating seizures -certain medicines for depression, anxiety, or psychotic disturbances -certain medicines for sleep -medicines for movement abnormalities as in Parkinson's disease, or for  gastrointestinal problems -muscle relaxants -narcotic medicines (opiates) for pain This list may not describe all possible interactions. Give your health care provider a list of all the medicines, herbs, non-prescription drugs, or dietary supplements you use. Also tell them if you smoke, drink alcohol, or use illegal drugs. Some items may interact with your medicine. What should I watch for while using this medicine? If your symptoms do not start to get better after taking this medicine for two days, check with your doctor or health care professional, you may have a problem that needs further evaluation. Check with your doctor or health care professional right away if you develop a fever or bloody diarrhea. You may get drowsy or dizzy. Do not drive, use machinery, or do anything that needs mental alertness until you know how this medicine affects you. Alcohol can increase possible drowsiness and dizziness. Avoid alcoholic drinks. Your mouth may get dry. Chewing sugarless gum or sucking hard candy, and drinking plenty of water may help. Contact your doctor if the problem does not go away or is severe. Drinking plenty of water can also help prevent dehydration that can occur with diarrhea. What side effects may I notice from receiving this medicine? Side effects that you should report to your doctor or health care professional as soon as possible: -allergic reactions like skin rash, itching or hives, swelling of the face, lips, or tongue -bloated, swollen feeling -breathing problems -changes in vision -fast, irregular heartbeat -stomach pain Side effects that usually do not require medical attention (report to your doctor or health care professional if they continue or are bothersome): -headache -loss of appetite -mood changes -nausea, vomiting -numbness or tingling in the hands and feet This list may not describe all possible side effects. Call your doctor for medical advice about side effects.  You may report side effects to FDA at 1-800-FDA-1088. Where should I keep my medicine? Keep out of the reach of children. This medicine can be abused. Keep your medicine in a safe place to protect it from theft. Do not share this medicine with anyone. Selling or giving away this medicine is dangerous and against the law. Store at room temperature between 15 and 30 degrees C (59 and 86 degrees F). Protect from light. Keep container tightly closed. Throw away any unused medicine after the expiration date. Discard unused medicine and used packaging carefully. Pets and children can be harmed if they find used or lost packages. NOTE: This sheet is a summary. It may not cover all possible information. If you have questions about this medicine, talk to your doctor, pharmacist, or health care provider.  2015, Elsevier/Gold Standard. (2011-03-11 15:32:45)

## 2015-04-11 ENCOUNTER — Inpatient Hospital Stay (HOSPITAL_COMMUNITY)

## 2015-04-12 ENCOUNTER — Other Ambulatory Visit (HOSPITAL_COMMUNITY): Payer: Self-pay | Admitting: Hematology & Oncology

## 2015-04-12 MED ORDER — CAPECITABINE 500 MG PO TABS: ORAL_TABLET | ORAL | Status: DC

## 2015-04-13 ENCOUNTER — Encounter (HOSPITAL_COMMUNITY)

## 2015-04-16 NOTE — Patient Instructions (Addendum)
[place]   CHEMOTHERAPY INSTRUCTIONS   Irinotecan - diarrhea, bone marrow suppression, hair loss, abdominal cramping. This drug can cause early and late diarrhea. Early diarrhea can occur within 24 hours of administration. We recommend Imodium. For diarrhea, you take Imodium 2 tablets @ the onset of diarrhea, and then 1 tablet every 2 hours until 12 hours have passed without a loose stool. May take 2 tablets @ bedtime and every 4 hours until morning. If diarrhea recurs repeat. Call [facility] and let us know that you are having diarrhea and whether or not the Imodium is working.  (this takes 2 hours to infuse)  Xeloda - diarrhea, hand-foot syndrome (hands/feet can get red/tender/and skin can peel). Avoid friction and hot environments on hands/feet. Wear cotton socks. Lotion twice a day to hands/fingers/feet/toes with Udder cream. Mucositis (inflammation of any mucosal membrane can develop -this can occur in the throat/mouth). Mouth sores, nausea/vomiting, anemia, fatigue can also develop. Take Imodium if diarrhea develops and contact us immediately - we will give you further instructions on how to take your Imodium. Xeloda usually comes with a teaching packet from the mail order/specialty pharmacy that will supply you with the drug that is really informative about diarrhea and hand-foot syndrome. No pregnant, child bearing age people, or animals should come into contact with this drug. Even though this is in pill form - it is still very powerful!!! If you should be instructed to discontinue taking this drug, bring the drug into the [facility] and we will dispose of it in a safe manner. Chemotherapy is a biohazard and must be disposed of properly. Do not touch this pill much. It would be best if the caregiver wore gloves while handling. Do not put this pill in with the rest of your pills in a pill box. Keep them in a separate pill box.   Xeloda '500mg'$  tablet. Take 4  tablets in the am and 4 tablets in the pm for 14 days in a row. Then no Xeloda for 7 days. You should take this medication within 30 minutes after eating your am and pm meals. NO grapefruit or grapefruit juice while taking Xeloda.    POTENTIAL SIDE EFFECTS OF TREATMENT: Increased Susceptibility to Infection, Vomiting, Hair Thinning, Changes in Character of Skin and Nails (brittleness, dryness,etc.), Bone Marrow Suppression, Abdominal Cramping, Complete Hair Loss, Nausea, Diarrhea, Sun Sensitivity and Mouth Sores   SELF IMAGE NEEDS AND REFERRALS MADE: Obtain hair accessories as soon as possible (wigs, scarves, turbans,caps,etc.)  Referral to Look Good, Feel Better consultant   EDUCATIONAL MATERIALS GIVEN AND REVIEWED: Specific Instructions Sheets: Irinotecan & Xeloda   SELF CARE ACTIVITIES WHILE ON CHEMOTHERAPY: Increase your fluid intake 48 hours prior to treatment and drink at least 2 quarts per day after treatment., No alcohol intake., No aspirin or other medications unless approved by your oncologist., Eat foods that are light and easy to digest., Eat foods at cold or room temperature., No fried, fatty, or spicy foods immediately before or after treatment., Have teeth cleaned professionally before starting treatment. Keep dentures and partial plates clean., Use soft toothbrush and do not use mouthwashes that contain alcohol. Biotene is a good mouthwash that is available at most pharmacies or may be ordered by calling [phone]., Use warm salt water gargles (1 teaspoon salt per 1 quart warm water) before and after meals and at bedtime. Or you may rinse with 2 tablespoons of three -percent hydrogen peroxide mixed in eight ounces  of water., Always use sunscreen with SPF (Sun Protection Factor) of 30 or higher., Use your nausea medication as directed to prevent nausea., Use your stool softener or laxative as directed to prevent constipation. and Use your anti-diarrheal medication as directed to  stop diarrhea.  Please wash your hands for at least 30 seconds using warm soapy water. Handwashing is the #1 way to prevent the spread of germs. Stay away from sick people or people who are getting over a cold. If you develop respiratory systems such as green/yellow mucus production or productive cough or persistent cough let us know and we will see if you need an antibiotic. It is a good idea to keep a pair of gloves on when going into grocery stores/[employer] to decrease your risk of coming into contact with germs on the carts, etc. Carry alcohol hand gel with you at all times and use it frequently if out in public. All foods need to be cooked thoroughly. No raw foods. No medium or undercooked meats, eggs. If your food is cooked medium well, it does not need to be hot pink or saturated with bloody liquid at all. Vegetables and fruits need to be washed/rinsed under the faucet with a dish detergent before being consumed. You can eat raw fruits and vegetables unless we tell you otherwise but it would be best if you cooked them or bought frozen. Do not eat off of salad bars or hot bars unless you really trust the cleanliness of the restaurant. If you need dental work, please let Dr. Whitney Muse know before you go for your appointment so that we can coordinate the best possible time for you in regards to your chemo regimen. You need to also let your dentist know that you are actively taking chemo. We may need to do labs prior to your dental appointment. We also want your bowels moving at least every other day. If this is not happening, we need to know so that we can get you on a bowel regimen to help you go.      MEDICATIONS: You have been given prescriptions for the following medications:  Xeloda '500mg'$  tablet. Take 4 tablets in the am and 4 tablets in the pm for 14 days in a row. Then no Xeloda for 7 days. This is your chemo pill.   Zofran '8mg'$  tablet. Take 1 tablet every 8 hours as needed for nausea/vomiting.  (#1 nausea med to take, this can constipate)  Compazine '10mg'$  tablet. Take 1 tablet every 6 hours as needed for nausea/vomiting. (#2 nausea med to take, this can make you sleepy)  EMLA cream. Apply a quarter size amount to port site 1 hour prior to chemo. Do not rub in. Cover with plastic wrap.   Over-the-Counter Meds:  Miralax 1 capful in 8 oz of fluid daily. May increase to two times a day if needed. This is a stool softener. If this doesn't work proceed you can add:  Senokot S  - start with 1 tablet two times a day and increase to 4 tablets two times a day if needed. (total of 8 tablets in a 24 hour period). This is a stimulant laxative.   Call us if this does not help your bowels move.   Imodium '2mg'$  capsule. Take 2 capsules after the 1st loose stool and then 1 capsule every 2 hours until you go a total of 12 hours without having a loose stool. Call the [facility] if loose stools continue. May take 2 tablets @  bedtime and every 4 hours until morning. If diarrhea recurs repeat. Call [facility] and let us know that you are having diarrhea and whether or not the Imodium is working.   You also have a prescription for Lomotil. You may take 1 capsule every 4 hours as needed for diarrhea.    SYMPTOMS TO REPORT AS SOON AS POSSIBLE AFTER TREATMENT:  FEVER GREATER THAN 100.5 F  CHILLS WITH OR WITHOUT FEVER  NAUSEA AND VOMITING THAT IS NOT CONTROLLED WITH YOUR NAUSEA MEDICATION  UNUSUAL SHORTNESS OF BREATH  UNUSUAL BRUISING OR BLEEDING  TENDERNESS IN MOUTH AND THROAT WITH OR WITHOUT PRESENCE OF ULCERS  URINARY PROBLEMS  BOWEL PROBLEMS  UNUSUAL RASH    Wear comfortable clothing and clothing appropriate for easy access to any Portacath or PICC line. Let us know if there is anything that we can do to make your therapy better!      I have been informed and understand all of the instructions given to me and have received a copy. I have been instructed to call the clinic [phone] or my family physician as soon as possible for continued medical care, if indicated. I do not have any more questions at this time but understand that I may call the [facility] or the Patient Navigator at [phone] during office hours should I have questions or need assistance in obtaining follow-up care.            Irinotecan injection What is this medicine? IRINOTECAN (ir in oh TEE kan ) is a chemotherapy drug. It is used to treat colon and rectal cancer. This medicine may be used for other purposes; ask your health care provider or pharmacist if you have questions. COMMON BRAND NAME(S): Camptosar What should I tell my health care provider before I take this medicine? They need to know if you have any of these conditions: -blood disorders -dehydration -diarrhea -infection (especially a virus infection such as chickenpox, cold sores, or herpes) -liver disease -low blood counts, like low white cell, platelet, or red cell counts -recent or ongoing radiation therapy -an unusual or allergic reaction to irinotecan, sorbitol, other chemotherapy, other medicines, foods, dyes, or preservatives -pregnant or trying to get pregnant -breast-feeding How should I use this medicine? This drug is given as an infusion into a vein. It is administered in a hospital or clinic by a specially trained health care professional. Talk to your pediatrician regarding the use of this medicine in children. Special care may be needed. Overdosage: If you think you have taken too much of this medicine contact a poison control center or emergency room at once. NOTE: This medicine is only for you. Do not share this medicine with others. What if I miss a dose? It is important not to miss your dose. Call your doctor or health care professional if you are unable to keep an appointment. What may interact with this medicine? Do not take this medicine with any of the following  medications: -atazanavir -certain medicines for fungal infections like itraconazole and ketoconazole -St. John's Wort This medicine may also interact with the following medications: -dexamethasone -diuretics -laxatives -medicines for seizures like carbamazepine, mephobarbital, phenobarbital, phenytoin, primidone -medicines to increase blood counts like filgrastim, pegfilgrastim, sargramostim -prochlorperazine -vaccines This list may not describe all possible interactions. Give your health care provider a list of all the medicines, herbs, non-prescription drugs, or dietary supplements you use. Also tell them if you smoke, drink alcohol, or use illegal drugs. Some items  may interact with your medicine. What should I watch for while using this medicine? Your condition will be monitored carefully while you are receiving this medicine. You will need important blood work done while you are taking this medicine. This drug may make you feel generally unwell. This is not uncommon, as chemotherapy can affect healthy cells as well as cancer cells. Report any side effects. Continue your course of treatment even though you feel ill unless your doctor tells you to stop. In some cases, you may be given additional medicines to help with side effects. Follow all directions for their use. You may get drowsy or dizzy. Do not drive, use machinery, or do anything that needs mental alertness until you know how this medicine affects you. Do not stand or sit up quickly, especially if you are an older patient. This reduces the risk of dizzy or fainting spells. Call your doctor or health care professional for advice if you get a fever, chills or sore throat, or other symptoms of a cold or flu. Do not treat yourself. This drug decreases your body's ability to fight infections. Try to avoid being around people who are sick. This medicine may increase your risk to bruise or bleed. Call your doctor or health care professional  if you notice any unusual bleeding. Be careful brushing and flossing your teeth or using a toothpick because you may get an infection or bleed more easily. If you have any dental work done, tell your dentist you are receiving this medicine. Avoid taking products that contain aspirin, acetaminophen, ibuprofen, naproxen, or ketoprofen unless instructed by your doctor. These medicines may hide a fever. Do not become pregnant while taking this medicine. Women should inform their doctor if they wish to become pregnant or think they might be pregnant. There is a potential for serious side effects to an unborn child. Talk to your health care professional or pharmacist for more information. Do not breast-feed an infant while taking this medicine. What side effects may I notice from receiving this medicine? Side effects that you should report to your doctor or health care professional as soon as possible: -allergic reactions like skin rash, itching or hives, swelling of the face, lips, or tongue -low blood counts - this medicine may decrease the number of white blood cells, red blood cells and platelets. You may be at increased risk for infections and bleeding. -signs of infection - fever or chills, cough, sore throat, pain or difficulty passing urine -signs of decreased platelets or bleeding - bruising, pinpoint red spots on the skin, black, tarry stools, blood in the urine -signs of decreased red blood cells - unusually weak or tired, fainting spells, lightheadedness -breathing problems -chest pain -diarrhea -feeling faint or lightheaded, falls -flushing, runny nose, sweating during infusion -mouth sores or pain -pain, swelling, redness or irritation where injected -pain, swelling, warmth in the leg -pain, tingling, numbness in the hands or feet -problems with balance, talking, walking -stomach cramps, pain -trouble passing urine or change in the amount of urine -vomiting as to be unable to hold down  drinks or food -yellowing of the eyes or skin Side effects that usually do not require medical attention (report to your doctor or health care professional if they continue or are bothersome): -constipation -hair loss -headache -loss of appetite -nausea, vomiting -stomach upset This list may not describe all possible side effects. Call your doctor for medical advice about side effects. You may report side effects to FDA at 1-800-FDA-1088. Where should  I keep my medicine? This drug is given in a hospital or clinic and will not be stored at home. NOTE: This sheet is a summary. It may not cover all possible information. If you have questions about this medicine, talk to your doctor, pharmacist, or health care provider.  2015, Elsevier/Gold Standard. (2012-12-27 16:29:32) Capecitabine tablets What is this medicine? CAPECITABINE (ka pe SITE a been) is a chemotherapy drug. It slows the growth of cancer cells. This medicine is used to treat breast cancer, and also colon or rectal cancer. This medicine may be used for other purposes; ask your health care provider or pharmacist if you have questions. COMMON BRAND NAME(S): Xeloda What should I tell my health care provider before I take this medicine? They need to know if you have any of these conditions: -bleeding or blood disorders -dihydropyrimidine dehydrogenase (DPD) deficiency -heart disease -infection (especially a virus infection such as chickenpox, cold sores, or herpes) -kidney disease -liver disease -an unusual or allergic reaction to capecitabine, 5-fluorouracil, other medicines, foods, dyes, or preservatives -pregnant or trying to get pregnant -breast-feeding How should I use this medicine? Take this medicine by mouth with a glass of water, within 30 minutes of the end of a meal. Do not cut, crush or chew this medicine. Follow the directions on the prescription label. Take your medicine at regular intervals. Do not take it more  often than directed. Do not stop taking except on your doctor's advice. Your doctor may want you to take a combination of 150 mg and 500 mg tablets for each dose. It is very important that you know how to correctly take your dose. Taking the wrong tablets could result in an overdose (too much medication) or underdose (too little medication). Talk to your pediatrician regarding the use of this medicine in children. Special care may be needed. Overdosage: If you think you have taken too much of this medicine contact a poison control center or emergency room at once. NOTE: This medicine is only for you. Do not share this medicine with others. What if I miss a dose? If you miss a dose, do not take the missed dose at all. Do not take double or extra doses. Instead, continue with your next scheduled dose and check with your doctor. What may interact with this medicine? -antacids with aluminum and/or magnesium -folic acid -leucovorin -medicines to increase blood counts like filgrastim, pegfilgrastim, sargramostim -phenytoin -vaccines -warfarin Talk to your doctor or health care professional before taking any of these medicines: -acetaminophen -aspirin -ibuprofen -ketoprofen -naproxen This list may not describe all possible interactions. Give your health care provider a list of all the medicines, herbs, non-prescription drugs, or dietary supplements you use. Also tell them if you smoke, drink alcohol, or use illegal drugs. Some items may interact with your medicine. What should I watch for while using this medicine? Visit your doctor for checks on your progress. This drug may make you feel generally unwell. This is not uncommon, as chemotherapy can affect healthy cells as well as cancer cells. Report any side effects. Continue your course of treatment even though you feel ill unless your doctor tells you to stop. In some cases, you may be given additional medicines to help with side effects. Follow  all directions for their use. Call your doctor or health care professional for advice if you get a fever, chills or sore throat, or other symptoms of a cold or flu. Do not treat yourself. This drug decreases your body's ability to  fight infections. Try to avoid being around people who are sick. This medicine may increase your risk to bruise or bleed. Call your doctor or health care professional if you notice any unusual bleeding. Be careful brushing and flossing your teeth or using a toothpick because you may get an infection or bleed more easily. If you have any dental work done, tell your dentist you are receiving this medicine. Avoid taking products that contain aspirin, acetaminophen, ibuprofen, naproxen, or ketoprofen unless instructed by your doctor. These medicines may hide a fever. Do not become pregnant while taking this medicine. Women should inform their doctor if they wish to become pregnant or think they might be pregnant. There is a potential for serious side effects to an unborn child. Talk to your health care professional or pharmacist for more information. Do not breast-feed an infant while taking this medicine. Men are advised not to father a child while taking this medicine. What side effects may I notice from receiving this medicine? Side effects that you should report to your doctor or health care professional as soon as possible: -allergic reactions like skin rash, itching or hives, swelling of the face, lips, or tongue -low blood counts - this medicine may decrease the number of white blood cells, red blood cells and platelets. You may be at increased risk for infections and bleeding. -signs of infection - fever or chills, cough, sore throat, pain or difficulty passing urine -signs of decreased platelets or bleeding - bruising, pinpoint red spots on the skin, black, tarry stools, blood in the urine -signs of decreased red blood cells - unusually weak or tired, fainting spells,  lightheadedness -breathing problems -changes in vision -chest pain -dark urine -diarrhea of more than 4 bowel movements in one day or any diarrhea at night; bloody or watery diarrhea -dizziness -mouth sores -nausea and vomiting -pain, swelling, redness at site where injected -pain, tingling, numbness in the hands or feet -redness, swelling, or sores on hands or feet -stomach pain -vomiting -yellow color of skin or eyes Side effects that usually do not require medical attention (report to your doctor or health care professional if they continue or are bothersome): -constipation -diarrhea -dry or itchy skin -hair loss -loss of appetite -nausea -weak or tired This list may not describe all possible side effects. Call your doctor for medical advice about side effects. You may report side effects to FDA at 1-800-FDA-1088. Where should I keep my medicine? Keep out of the reach of children. Store at room temperature between 15 and 30 degrees C (59 and 86 degrees F). Keep container tightly closed. Throw away any unused medicine after the expiration date. NOTE: This sheet is a summary. It may not cover all possible information. If you have questions about this medicine, talk to your doctor, pharmacist, or health care provider.  2015, Elsevier/Gold Standard. (2013-05-06 12:47:46)

## 2015-04-17 NOTE — Patient Instructions (Signed)
[facility]   CHEMOTHERAPY INSTRUCTIONS   Irinotecan - diarrhea, bone marrow suppression, hair loss, abdominal cramping. This drug can cause early and late diarrhea. Early diarrhea can occur within 24 hours of administration. We recommend Imodium. For diarrhea, you take Imodium 2 tablets @ the onset of diarrhea, and then 1 tablet every 2 hours until 12 hours have passed without a loose stool. May take 2 tablets @ bedtime and every 4 hours until morning. If diarrhea recurs repeat. Call [facility] and let us know that you are having diarrhea and whether or not the Imodium is working.  (this takes 2 hours to infuse)  Xeloda - diarrhea, hand-foot syndrome (hands/feet can get red/tender/and skin can peel). Avoid friction and hot environments on hands/feet. Wear cotton socks. Lotion twice a day to hands/fingers/feet/toes with Udder cream. Mucositis (inflammation of any mucosal membrane can develop -this can occur in the throat/mouth). Mouth sores, nausea/vomiting, anemia, fatigue can also develop. Take Imodium if diarrhea develops and contact us immediately - we will give you further instructions on how to take your Imodium. Xeloda usually comes with a teaching packet from the mail order/specialty pharmacy that will supply you with the drug that is really informative about diarrhea and hand-foot syndrome. No pregnant, child bearing age people, or animals should come into contact with this drug. Even though this is in pill form - it is still very powerful!!! If you should be instructed to discontinue taking this drug, bring the drug into the [facility] and we will dispose of it in a safe manner. Chemotherapy is a biohazard and must be disposed of properly. Do not touch this pill much. It would be best if the caregiver wore gloves while handling. Do not put this pill in with the rest of your pills in a pill box. Keep them in a separate pill box.   Xeloda '500mg'$  tablet. Take 4  tablets in the am and 4 tablets in the pm for 14 days in a row. Then no Xeloda for 7 days. You should take this medication within 30 minutes after eating your am and pm meals. NO grapefruit or grapefruit juice while taking Xeloda.    POTENTIAL SIDE EFFECTS OF TREATMENT: Increased Susceptibility to Infection, Vomiting, Hair Thinning, Changes in Character of Skin and Nails (brittleness, dryness,etc.), Bone Marrow Suppression, Abdominal Cramping, Complete Hair Loss, Nausea, Diarrhea, Sun Sensitivity and Mouth Sores   SELF IMAGE NEEDS AND REFERRALS MADE: Obtain hair accessories as soon as possible (wigs, scarves, turbans,caps,etc.)  Referral to Look Good, Feel Better consultant   EDUCATIONAL MATERIALS GIVEN AND REVIEWED: Specific Instructions Sheets: Irinotecan & Xeloda   SELF CARE ACTIVITIES WHILE ON CHEMOTHERAPY: Increase your fluid intake 48 hours prior to treatment and drink at least 2 quarts per day after treatment., No alcohol intake., No aspirin or other medications unless approved by your oncologist., Eat foods that are light and easy to digest., Eat foods at cold or room temperature., No fried, fatty, or spicy foods immediately before or after treatment., Have teeth cleaned professionally before starting treatment. Keep dentures and partial plates clean., Use soft toothbrush and do not use mouthwashes that contain alcohol. Biotene is a good mouthwash that is available at most pharmacies or may be ordered by calling [phone]., Use warm salt water gargles (1 teaspoon salt per 1 quart warm water) before and after meals and at bedtime. Or you may rinse with 2 tablespoons of three -percent hydrogen peroxide mixed in eight ounces  of water., Always use sunscreen with SPF (Sun Protection Factor) of 30 or higher., Use your nausea medication as directed to prevent nausea., Use your stool softener or laxative as directed to prevent constipation. and Use your anti-diarrheal medication as directed to  stop diarrhea.  Please wash your hands for at least 30 seconds using warm soapy water. Handwashing is the #1 way to prevent the spread of germs. Stay away from sick people or people who are getting over a cold. If you develop respiratory systems such as green/yellow mucus production or productive cough or persistent cough let us know and we will see if you need an antibiotic. It is a good idea to keep a pair of gloves on when going into grocery stores/[employer] to decrease your risk of coming into contact with germs on the carts, etc. Carry alcohol hand gel with you at all times and use it frequently if out in public. All foods need to be cooked thoroughly. No raw foods. No medium or undercooked meats, eggs. If your food is cooked medium well, it does not need to be hot pink or saturated with bloody liquid at all. Vegetables and fruits need to be washed/rinsed under the faucet with a dish detergent before being consumed. You can eat raw fruits and vegetables unless we tell you otherwise but it would be best if you cooked them or bought frozen. Do not eat off of salad bars or hot bars unless you really trust the cleanliness of the restaurant. If you need dental work, please let Dr. Whitney Muse know before you go for your appointment so that we can coordinate the best possible time for you in regards to your chemo regimen. You need to also let your dentist know that you are actively taking chemo. We may need to do labs prior to your dental appointment. We also want your bowels moving at least every other day. If this is not happening, we need to know so that we can get you on a bowel regimen to help you go.      MEDICATIONS: You have been given prescriptions for the following medications:  Xeloda '500mg'$  tablet. Take 4 tablets in the am and 4 tablets in the pm for 14 days in a row. Then no Xeloda for 7 days. This is your chemo pill.   Zofran '8mg'$  tablet. Take 1 tablet every 8 hours as needed for nausea/vomiting.  (#1 nausea med to take, this can constipate)  Compazine '10mg'$  tablet. Take 1 tablet every 6 hours as needed for nausea/vomiting. (#2 nausea med to take, this can make you sleepy)  EMLA cream. Apply a quarter size amount to port site 1 hour prior to chemo. Do not rub in. Cover with plastic wrap.   Over-the-Counter Meds:  Miralax 1 capful in 8 oz of fluid daily. May increase to two times a day if needed. This is a stool softener. If this doesn't work proceed you can add:  Senokot S  - start with 1 tablet two times a day and increase to 4 tablets two times a day if needed. (total of 8 tablets in a 24 hour period). This is a stimulant laxative.   Call us if this does not help your bowels move.   Imodium '2mg'$  capsule. Take 2 capsules after the 1st loose stool and then 1 capsule every 2 hours until you go a total of 12 hours without having a loose stool. Call the [facility] if loose stools continue. May take 2 tablets @  bedtime and every 4 hours until morning. If diarrhea recurs repeat. Call [facility] and let us know that you are having diarrhea and whether or not the Imodium is working.   You also have a prescription for Lomotil. You may take 1 capsule every 4 hours as needed for diarrhea.    SYMPTOMS TO REPORT AS SOON AS POSSIBLE AFTER TREATMENT:  FEVER GREATER THAN 100.5 F  CHILLS WITH OR WITHOUT FEVER  NAUSEA AND VOMITING THAT IS NOT CONTROLLED WITH YOUR NAUSEA MEDICATION  UNUSUAL SHORTNESS OF BREATH  UNUSUAL BRUISING OR BLEEDING  TENDERNESS IN MOUTH AND THROAT WITH OR WITHOUT PRESENCE OF ULCERS  URINARY PROBLEMS  BOWEL PROBLEMS  UNUSUAL RASH    Wear comfortable clothing and clothing appropriate for easy access to any Portacath or PICC line. Let us know if there is anything that we can do to make your therapy better!      I have been informed and understand all of the instructions given to me and have received a copy. I have been instructed to call the clinic [phone] or my family physician as soon as possible for continued medical care, if indicated. I do not have any more questions at this time but understand that I may call the [facility] or the Patient Navigator at [phone] during office hours should I have questions or need assistance in obtaining follow-up care.  Irinotecan injection What is this medicine? IRINOTECAN (ir in oh TEE kan ) is a chemotherapy drug. It is used to treat colon and rectal cancer. This medicine may be used for other purposes; ask your health care provider or pharmacist if you have questions. COMMON BRAND NAME(S): Camptosar What should I tell my health care provider before I take this medicine? They need to know if you have any of these conditions: -blood disorders -dehydration -diarrhea -infection (especially a virus infection such as chickenpox, cold sores, or herpes) -liver disease -low blood counts, like low white cell, platelet, or red cell counts -recent or ongoing radiation therapy -an unusual or allergic reaction to irinotecan, sorbitol, other chemotherapy, other medicines, foods, dyes, or preservatives -pregnant or trying to get pregnant -breast-feeding How should I use this medicine? This drug is given as an infusion into a vein. It is administered in a hospital or clinic by a specially trained health care professional. Talk to your pediatrician regarding the use of this medicine in children. Special care may be needed. Overdosage: If you think you have taken too much of this medicine contact a poison control center or emergency room at once. NOTE: This medicine is only for you. Do not share this medicine with others. What if I miss a dose? It is important not to miss your dose. Call your doctor or health care professional if you are unable to keep an appointment. What may interact with this medicine? Do not take this medicine with any of the following medications: -atazanavir -certain medicines  for fungal infections like itraconazole and ketoconazole -St. John's Wort This medicine may also interact with the following medications: -dexamethasone -diuretics -laxatives -medicines for seizures like carbamazepine, mephobarbital, phenobarbital, phenytoin, primidone -medicines to increase blood counts like filgrastim, pegfilgrastim, sargramostim -prochlorperazine -vaccines This list may not describe all possible interactions. Give your health care provider a list of all the medicines, herbs, non-prescription drugs, or dietary supplements you use. Also tell them if you smoke, drink alcohol, or use illegal drugs. Some items may interact with your medicine. What should I watch for  while using this medicine? Your condition will be monitored carefully while you are receiving this medicine. You will need important blood work done while you are taking this medicine. This drug may make you feel generally unwell. This is not uncommon, as chemotherapy can affect healthy cells as well as cancer cells. Report any side effects. Continue your course of treatment even though you feel ill unless your doctor tells you to stop. In some cases, you may be given additional medicines to help with side effects. Follow all directions for their use. You may get drowsy or dizzy. Do not drive, use machinery, or do anything that needs mental alertness until you know how this medicine affects you. Do not stand or sit up quickly, especially if you are an older patient. This reduces the risk of dizzy or fainting spells. Call your doctor or health care professional for advice if you get a fever, chills or sore throat, or other symptoms of a cold or flu. Do not treat yourself. This drug decreases your body's ability to fight infections. Try to avoid being around people who are sick. This medicine may increase your risk to bruise or bleed. Call your doctor or health care professional if you notice any unusual bleeding. Be  careful brushing and flossing your teeth or using a toothpick because you may get an infection or bleed more easily. If you have any dental work done, tell your dentist you are receiving this medicine. Avoid taking products that contain aspirin, acetaminophen, ibuprofen, naproxen, or ketoprofen unless instructed by your doctor. These medicines may hide a fever. Do not become pregnant while taking this medicine. Women should inform their doctor if they wish to become pregnant or think they might be pregnant. There is a potential for serious side effects to an unborn child. Talk to your health care professional or pharmacist for more information. Do not breast-feed an infant while taking this medicine. What side effects may I notice from receiving this medicine? Side effects that you should report to your doctor or health care professional as soon as possible: -allergic reactions like skin rash, itching or hives, swelling of the face, lips, or tongue -low blood counts - this medicine may decrease the number of white blood cells, red blood cells and platelets. You may be at increased risk for infections and bleeding. -signs of infection - fever or chills, cough, sore throat, pain or difficulty passing urine -signs of decreased platelets or bleeding - bruising, pinpoint red spots on the skin, black, tarry stools, blood in the urine -signs of decreased red blood cells - unusually weak or tired, fainting spells, lightheadedness -breathing problems -chest pain -diarrhea -feeling faint or lightheaded, falls -flushing, runny nose, sweating during infusion -mouth sores or pain -pain, swelling, redness or irritation where injected -pain, swelling, warmth in the leg -pain, tingling, numbness in the hands or feet -problems with balance, talking, walking -stomach cramps, pain -trouble passing urine or change in the amount of urine -vomiting as to be unable to hold down drinks or food -yellowing of the eyes  or skin Side effects that usually do not require medical attention (report to your doctor or health care professional if they continue or are bothersome): -constipation -hair loss -headache -loss of appetite -nausea, vomiting -stomach upset This list may not describe all possible side effects. Call your doctor for medical advice about side effects. You may report side effects to FDA at 1-800-FDA-1088. Where should I keep my medicine? This drug is given in a  hospital or clinic and will not be stored at home. NOTE: This sheet is a summary. It may not cover all possible information. If you have questions about this medicine, talk to your doctor, pharmacist, or health care provider.  2015, Elsevier/Gold Standard. (2012-12-27 16:29:32) Capecitabine tablets What is this medicine? CAPECITABINE (ka pe SITE a been) is a chemotherapy drug. It slows the growth of cancer cells. This medicine is used to treat breast cancer, and also colon or rectal cancer. This medicine may be used for other purposes; ask your health care provider or pharmacist if you have questions. COMMON BRAND NAME(S): Xeloda What should I tell my health care provider before I take this medicine? They need to know if you have any of these conditions: -bleeding or blood disorders -dihydropyrimidine dehydrogenase (DPD) deficiency -heart disease -infection (especially a virus infection such as chickenpox, cold sores, or herpes) -kidney disease -liver disease -an unusual or allergic reaction to capecitabine, 5-fluorouracil, other medicines, foods, dyes, or preservatives -pregnant or trying to get pregnant -breast-feeding How should I use this medicine? Take this medicine by mouth with a glass of water, within 30 minutes of the end of a meal. Do not cut, crush or chew this medicine. Follow the directions on the prescription label. Take your medicine at regular intervals. Do not take it more often than directed. Do not stop taking  except on your doctor's advice. Your doctor may want you to take a combination of 150 mg and 500 mg tablets for each dose. It is very important that you know how to correctly take your dose. Taking the wrong tablets could result in an overdose (too much medication) or underdose (too little medication). Talk to your pediatrician regarding the use of this medicine in children. Special care may be needed. Overdosage: If you think you have taken too much of this medicine contact a poison control center or emergency room at once. NOTE: This medicine is only for you. Do not share this medicine with others. What if I miss a dose? If you miss a dose, do not take the missed dose at all. Do not take double or extra doses. Instead, continue with your next scheduled dose and check with your doctor. What may interact with this medicine? -antacids with aluminum and/or magnesium -folic acid -leucovorin -medicines to increase blood counts like filgrastim, pegfilgrastim, sargramostim -phenytoin -vaccines -warfarin Talk to your doctor or health care professional before taking any of these medicines: -acetaminophen -aspirin -ibuprofen -ketoprofen -naproxen This list may not describe all possible interactions. Give your health care provider a list of all the medicines, herbs, non-prescription drugs, or dietary supplements you use. Also tell them if you smoke, drink alcohol, or use illegal drugs. Some items may interact with your medicine. What should I watch for while using this medicine? Visit your doctor for checks on your progress. This drug may make you feel generally unwell. This is not uncommon, as chemotherapy can affect healthy cells as well as cancer cells. Report any side effects. Continue your course of treatment even though you feel ill unless your doctor tells you to stop. In some cases, you may be given additional medicines to help with side effects. Follow all directions for their use. Call your  doctor or health care professional for advice if you get a fever, chills or sore throat, or other symptoms of a cold or flu. Do not treat yourself. This drug decreases your body's ability to fight infections. Try to avoid being around people who are  sick. This medicine may increase your risk to bruise or bleed. Call your doctor or health care professional if you notice any unusual bleeding. Be careful brushing and flossing your teeth or using a toothpick because you may get an infection or bleed more easily. If you have any dental work done, tell your dentist you are receiving this medicine. Avoid taking products that contain aspirin, acetaminophen, ibuprofen, naproxen, or ketoprofen unless instructed by your doctor. These medicines may hide a fever. Do not become pregnant while taking this medicine. Women should inform their doctor if they wish to become pregnant or think they might be pregnant. There is a potential for serious side effects to an unborn child. Talk to your health care professional or pharmacist for more information. Do not breast-feed an infant while taking this medicine. Men are advised not to father a child while taking this medicine. What side effects may I notice from receiving this medicine? Side effects that you should report to your doctor or health care professional as soon as possible: -allergic reactions like skin rash, itching or hives, swelling of the face, lips, or tongue -low blood counts - this medicine may decrease the number of white blood cells, red blood cells and platelets. You may be at increased risk for infections and bleeding. -signs of infection - fever or chills, cough, sore throat, pain or difficulty passing urine -signs of decreased platelets or bleeding - bruising, pinpoint red spots on the skin, black, tarry stools, blood in the urine -signs of decreased red blood cells - unusually weak or tired, fainting spells, lightheadedness -breathing  problems -changes in vision -chest pain -dark urine -diarrhea of more than 4 bowel movements in one day or any diarrhea at night; bloody or watery diarrhea -dizziness -mouth sores -nausea and vomiting -pain, swelling, redness at site where injected -pain, tingling, numbness in the hands or feet -redness, swelling, or sores on hands or feet -stomach pain -vomiting -yellow color of skin or eyes Side effects that usually do not require medical attention (report to your doctor or health care professional if they continue or are bothersome): -constipation -diarrhea -dry or itchy skin -hair loss -loss of appetite -nausea -weak or tired This list may not describe all possible side effects. Call your doctor for medical advice about side effects. You may report side effects to FDA at 1-800-FDA-1088. Where should I keep my medicine? Keep out of the reach of children. Store at room temperature between 15 and 30 degrees C (59 and 86 degrees F). Keep container tightly closed. Throw away any unused medicine after the expiration date. NOTE: This sheet is a summary. It may not cover all possible information. If you have questions about this medicine, talk to your doctor, pharmacist, or health care provider.  2015, Elsevier/Gold Standard. (2013-05-06 12:47:46)

## 2015-04-18 ENCOUNTER — Ambulatory Visit (HOSPITAL_COMMUNITY): Admitting: Oncology

## 2015-04-18 ENCOUNTER — Ambulatory Visit (HOSPITAL_COMMUNITY): Payer: Medicaid Other

## 2015-04-19 ENCOUNTER — Telehealth (HOSPITAL_COMMUNITY): Payer: Self-pay | Admitting: *Deleted

## 2015-04-19 ENCOUNTER — Encounter (HOSPITAL_COMMUNITY): Payer: Medicaid Other | Attending: Hematology & Oncology

## 2015-04-19 DIAGNOSIS — C7801 Secondary malignant neoplasm of right lung: Secondary | ICD-10-CM | POA: Diagnosis not present

## 2015-04-19 DIAGNOSIS — C189 Malignant neoplasm of colon, unspecified: Secondary | ICD-10-CM | POA: Insufficient documentation

## 2015-04-19 DIAGNOSIS — C187 Malignant neoplasm of sigmoid colon: Secondary | ICD-10-CM

## 2015-04-19 DIAGNOSIS — C7800 Secondary malignant neoplasm of unspecified lung: Secondary | ICD-10-CM | POA: Insufficient documentation

## 2015-04-19 DIAGNOSIS — C787 Secondary malignant neoplasm of liver and intrahepatic bile duct: Secondary | ICD-10-CM

## 2015-04-19 MED ORDER — LOPERAMIDE HCL 2 MG PO CAPS: ORAL_CAPSULE | ORAL | Status: DC

## 2015-04-19 MED ORDER — CYCLOBENZAPRINE HCL 10 MG PO TABS: 10.0000 mg | ORAL_TABLET | Freq: Four times a day (QID) | ORAL | Status: DC | PRN

## 2015-04-19 MED ORDER — MAGIC MOUTHWASH: ORAL | Status: DC

## 2015-04-19 MED ORDER — GRANISETRON HCL 1 MG PO TABS: ORAL_TABLET | ORAL | Status: DC

## 2015-04-19 NOTE — Progress Notes (Signed)
Approval obtained by Medicaid for Kytril x 1 year: approval # [id number]

## 2015-04-19 NOTE — Telephone Encounter (Signed)
[facility] called to let us know that Xeloda and Lexapro can cause QT prolongation and is a  Category D.  Imodium, Phenergan, and Lexapro used together can cause QT prolongation and is a Category D. Xeloda and Kytril used together can cause QT prolongation and is a Category C.

## 2015-04-23 ENCOUNTER — Other Ambulatory Visit (HOSPITAL_COMMUNITY): Payer: Self-pay | Admitting: Oncology

## 2015-04-24 ENCOUNTER — Inpatient Hospital Stay (HOSPITAL_COMMUNITY)

## 2015-04-24 ENCOUNTER — Encounter: Payer: Self-pay | Admitting: *Deleted

## 2015-04-24 ENCOUNTER — Encounter (HOSPITAL_BASED_OUTPATIENT_CLINIC_OR_DEPARTMENT_OTHER): Payer: Medicaid Other

## 2015-04-24 VITALS — BP 120/81 | HR 72 | Temp 97.8°F | Resp 16 | Wt 252.2 lb

## 2015-04-24 DIAGNOSIS — C787 Secondary malignant neoplasm of liver and intrahepatic bile duct: Secondary | ICD-10-CM

## 2015-04-24 DIAGNOSIS — Z5111 Encounter for antineoplastic chemotherapy: Secondary | ICD-10-CM

## 2015-04-24 DIAGNOSIS — C187 Malignant neoplasm of sigmoid colon: Secondary | ICD-10-CM | POA: Diagnosis not present

## 2015-04-24 DIAGNOSIS — C189 Malignant neoplasm of colon, unspecified: Secondary | ICD-10-CM | POA: Diagnosis not present

## 2015-04-24 DIAGNOSIS — C7800 Secondary malignant neoplasm of unspecified lung: Secondary | ICD-10-CM | POA: Diagnosis not present

## 2015-04-24 DIAGNOSIS — C7801 Secondary malignant neoplasm of right lung: Secondary | ICD-10-CM

## 2015-04-24 LAB — COMPREHENSIVE METABOLIC PANEL
ALT: 19 U/L (ref 14–54)
ANION GAP: 7 (ref 5–15)
AST: 27 U/L (ref 15–41)
Albumin: 3.6 g/dL (ref 3.5–5.0)
Alkaline Phosphatase: 87 U/L (ref 38–126)
BUN: 9 mg/dL (ref 6–20)
CHLORIDE: 106 mmol/L (ref 101–111)
CO2: 25 mmol/L (ref 22–32)
CREATININE: 0.7 mg/dL (ref 0.44–1.00)
Calcium: 8.5 mg/dL — ABNORMAL LOW (ref 8.9–10.3)
Glucose, Bld: 150 mg/dL — ABNORMAL HIGH (ref 65–99)
Potassium: 3.9 mmol/L (ref 3.5–5.1)
SODIUM: 138 mmol/L (ref 135–145)
Total Bilirubin: 0.4 mg/dL (ref 0.3–1.2)
Total Protein: 7.7 g/dL (ref 6.5–8.1)

## 2015-04-24 LAB — CBC WITH DIFFERENTIAL/PLATELET
Basophils Absolute: 0 10*3/uL (ref 0.0–0.1)
Basophils Relative: 0 %
EOS ABS: 0.2 10*3/uL (ref 0.0–0.7)
EOS PCT: 2 %
HCT: 39.1 % (ref 36.0–46.0)
Hemoglobin: 12.9 g/dL (ref 12.0–15.0)
LYMPHS ABS: 3.2 10*3/uL (ref 0.7–4.0)
LYMPHS PCT: 31 %
MCH: 31 pg (ref 26.0–34.0)
MCHC: 33 g/dL (ref 30.0–36.0)
MCV: 94 fL (ref 78.0–100.0)
MONO ABS: 0.8 10*3/uL (ref 0.1–1.0)
MONOS PCT: 8 %
Neutro Abs: 6.2 10*3/uL (ref 1.7–7.7)
Neutrophils Relative %: 59 %
PLATELETS: 227 10*3/uL (ref 150–400)
RBC: 4.16 MIL/uL (ref 3.87–5.11)
RDW: 13.8 % (ref 11.5–15.5)
WBC: 10.5 10*3/uL (ref 4.0–10.5)

## 2015-04-24 MED ORDER — PALONOSETRON HCL INJECTION 0.25 MG/5ML
0.2500 mg | Freq: Once | INTRAVENOUS | Status: AC
  Administered 2015-04-24: 0.25 mg via INTRAVENOUS

## 2015-04-24 MED ORDER — SODIUM CHLORIDE 0.9 % IV SOLN
Freq: Once | INTRAVENOUS | Status: AC
  Administered 2015-04-24: 11:00:00 via INTRAVENOUS
  Filled 2015-04-24: qty 5

## 2015-04-24 MED ORDER — ATROPINE SULFATE 1 MG/ML IJ SOLN
INTRAMUSCULAR | Status: AC
  Filled 2015-04-24: qty 1

## 2015-04-24 MED ORDER — ATROPINE SULFATE 1 MG/ML IJ SOLN
0.5000 mg | Freq: Once | INTRAMUSCULAR | Status: AC | PRN
  Administered 2015-04-24: 0.5 mg via INTRAVENOUS

## 2015-04-24 MED ORDER — SODIUM CHLORIDE 0.9 % IV SOLN
Freq: Once | INTRAVENOUS | Status: AC
  Administered 2015-04-24: 10:00:00 via INTRAVENOUS

## 2015-04-24 MED ORDER — HEPARIN SOD (PORK) LOCK FLUSH 100 UNIT/ML IV SOLN
500.0000 [IU] | Freq: Once | INTRAVENOUS | Status: AC | PRN
  Administered 2015-04-24: 500 [IU]

## 2015-04-24 MED ORDER — LORAZEPAM 2 MG/ML IJ SOLN: 0.5000 mg | Freq: Once | INTRAMUSCULAR | Status: DC

## 2015-04-24 MED ORDER — IRINOTECAN HCL CHEMO INJECTION 100 MG/5ML
200.0000 mg/m2 | Freq: Once | INTRAVENOUS | Status: AC
  Administered 2015-04-24: 326 mg via INTRAVENOUS
  Filled 2015-04-24: qty 5.43

## 2015-04-24 MED ORDER — HEPARIN SOD (PORK) LOCK FLUSH 100 UNIT/ML IV SOLN
INTRAVENOUS | Status: AC
  Filled 2015-04-24: qty 5

## 2015-04-24 MED ORDER — PALONOSETRON HCL INJECTION 0.25 MG/5ML
INTRAVENOUS | Status: AC
  Filled 2015-04-24: qty 5

## 2015-04-24 MED ORDER — LORAZEPAM 2 MG/ML IJ SOLN
INTRAMUSCULAR | Status: AC
  Filled 2015-04-24: qty 1

## 2015-04-24 MED ORDER — SODIUM CHLORIDE 0.9 % IJ SOLN: 10.0000 mL | INTRAMUSCULAR | Status: DC | PRN

## 2015-04-24 NOTE — Progress Notes (Signed)
Patient declined receiving the Flu Vaccination.  She was informed of the importance of it for her immunosuppression, but she would rather not have it.  She tolerated her infusion well and VSS post infusion.

## 2015-04-24 NOTE — Progress Notes (Signed)
[facility] Clinical Social Work  Clinical Social Work was referred by [facility] rounding for assessment of psychosocial needs due to pt starting new treatment . Clinical Social Worker met with patient at [facility] to offer support and assess for needs. Pt reports she has moved to [place] and is glad to have switched her care her full time. Pt reports she has some financial concerns and recently was approved for food stamps. She is open to additional resources and support. Pt eager to attend support programs and CSW provided her with information today. CSW also provided her with [facility] application. CSW to follow and assist. Pt agrees to begin application and return to CSW or directly to [facility]. Pt aware to contact CSW as needed.     Clinical Social Work interventions: Resource education Supportive listening  [place], [place] Tuesdays   [phone]

## 2015-04-25 LAB — CEA: CEA: 318.1 ng/mL — AB (ref 0.0–4.7)

## 2015-04-26 ENCOUNTER — Encounter (HOSPITAL_COMMUNITY)

## 2015-04-27 ENCOUNTER — Encounter (HOSPITAL_COMMUNITY): Payer: Self-pay | Admitting: Hematology & Oncology

## 2015-04-27 ENCOUNTER — Encounter (HOSPITAL_BASED_OUTPATIENT_CLINIC_OR_DEPARTMENT_OTHER): Payer: Medicaid Other | Admitting: Hematology & Oncology

## 2015-04-27 VITALS — BP 148/91 | HR 108 | Temp 98.5°F | Resp 20 | Wt 249.4 lb

## 2015-04-27 DIAGNOSIS — C7800 Secondary malignant neoplasm of unspecified lung: Secondary | ICD-10-CM

## 2015-04-27 DIAGNOSIS — K5900 Constipation, unspecified: Secondary | ICD-10-CM

## 2015-04-27 DIAGNOSIS — C189 Malignant neoplasm of colon, unspecified: Secondary | ICD-10-CM | POA: Diagnosis not present

## 2015-04-27 NOTE — Patient Instructions (Signed)
[place] at [facility] Discharge Instructions  RECOMMENDATIONS MADE BY THE CONSULTANT AND ANY TEST RESULTS WILL BE SENT TO YOUR REFERRING PHYSICIAN.   Constipation given to you by Dr. Whitney Muse since you are having constipation. Please starting taking Miralax +/- Senokot to help your bowels move. Don't forget that the Irinotecan can cause a quick onset of diarrhea or a delayed onset of diarrhea.   Calendar given to you by Hildred Alamin - the one that Whitney spilled coffee on previously   If you continue to have fatigue, we can adjust the Xeloda (pills) that you are taking.   Thank you for choosing [place] at [facility] to provide your oncology and hematology care.  To afford each patient quality time with our provider, please arrive at least 15 minutes before your scheduled appointment time.    You need to re-schedule your appointment should you arrive 10 or more minutes late.  We strive to give you quality time with our providers, and arriving late affects you and other patients whose appointments are after yours.  Also, if you no show three or more times for appointments you may be dismissed from the clinic at the providers discretion.     Again, thank you for choosing [facility].  Our hope is that these requests will decrease the amount of time that you wait before being seen by our physicians.       _____________________________________________________________  Should you have questions after your visit to [facility], please contact our office at [phone] between the hours of 8:30 a.m. and 4:30 p.m.  Voicemails left after 4:30 p.m. will not be returned until the following business day.  For prescription refill requests, have your pharmacy contact our office.

## 2015-04-27 NOTE — Progress Notes (Signed)
[place] Progress Note  Patient Care Team: No Pcp Per Patient as PCP - General ([place]) Melrose Nakayama, MD as Consulting Physician (Cardiothoracic Surgery)  CHIEF COMPLAINTS/PURPOSE OF CONSULTATION:  Stage IV CRC CT of abdomen and pelvis on 02/20/2014 at [facility] showing 3.6 cm apple core lesion in the proximal sigmoid colon 11 mm hypoenhancing lesion in the medial left hepatic dome, 4.1 cm right lower lobe mass and adjacent right lower lobe nodule CEA on 08/18/2014 of 93.7 ng/ml Right upper extremity DVT 04/17/2014 paired brachial veins, axillary vein, and peripheral aspect of the right subclavian vein CT chest 07/06/2014 with bilateral pulmonary nodules and masses, RUL, lateral RUL, posterior LUL, lobulated nodule in the subpleural RLL,     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 ([name])   02/20/2014 Imaging CT of abdomen and pelvis on 02/20/2014 at [facility] showing 3.6 cm apple core lesion in the proximal sigmoid colon 11 mm hypoenhancing lesion in the medial left hepatic dome, 4.1 cm right lower lobe mass and adjacent right lower lobe nodule   04/17/2014 Imaging Right upper extremity DVT 04/17/2014 paired brachial veins, axillary vein, and peripheral aspect of the right subclavian vein   07/06/2014 Imaging CT chest 07/06/2014 with bilateral pulmonary nodules and masses, RUL, lateral RUL, posterior LUL, lobulated nodule in the subpleural RLL   08/18/2014 Tumor Marker CEA 93.7 ng/ml   10/09/2014 Initial Diagnosis 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   02/19/2015 Imaging Progressive pulmonary metastatic disease as detailed above. Stable mediastinal lymphadenopathy. No abdominal/pelvic metastatic disease is identified   04/24/2015 -  Chemotherapy XELIRI started on 04/24/2015. patient did not want to wear 5-FU pump.      HISTORY OF PRESENTING ILLNESS:   Erica Barker 54 y.o. female is here because of stage IV colon cancer. She has just started therapy with XELIRI. She has had problems in the past tolerating Xeloda. So far she is doing well. Plan is for addition of Avastin if she tolerates her first cycle well.  Erica Barker is here today with a female friend. She is following up after her first chemo treatment on Tuesday. She says the treatment went well, and that she slept during the treatment itself and afterward. Her only complaint was that she felt woozy afterward, and experienced dry mouth.  She reports that today she feels okay, still "just a little woozy, maybe lack of energy, but that's it." She says the chemo is contributing to her lack of energy, but she adds that after the "fanny pack," she was used to going a few days before her energy levels would come back up, so she is hoping for this. She expresses a desire to rejuvenate her energy levels, and mentions the B12 vitamin that dissolves under the tongue. She doesn't know how to obtain it at the moment. She says "I just need that energy."  After the first day, she says she felt a sour stomach, with a bit of "foaming." She declares "I had to get that out of me," and says that she made herself throw up. She remarks that "once she got that out" she felt better.  She denies any sore hands or feet; no mouth sores.  She confirms that she's taking the nausea medicine before she takes the Xeloda.  Erica Barker remarks that she's been resting since Tuesday, and "letting the medicine do what  it does." She say she slept on and off on Tuesday after treatment, and then slept half of Wednesday. She says depending on her energy level, she will or will not get out and do something this weekend. She continues to repeat that she just needs some energy first. Her friend adds, "I always tell her that if you stay in a slump, you will stay in a slump;" and that she  encourages Erica Barker to get out and about.  She also says that she cannot drink water at the moment because it tastes like metal, so she has been drinking juice.  She remarks that last Saturday, she had some back pain emerge out of nowhere. She was given muscle relaxers and they didn't help her much, but she went to the store and got something for back pain which helped. She was advised to call in if this back pain returns or gets worse.  She says she's been a bit constipated, so that's one of her primary concerns.   MEDICAL HISTORY:  Past Medical History  Diagnosis Date  . Hypertension   . Depression   . Pulmonary nodules 01/30/14    CTA CHEST  . Lymphadenopathy, mediastinal 01/2014    CTA ANGIO .Marland KitchenBethany  . Morbid obesity   . Diabetes mellitus, type II   . Headache(784.0)     SURGICAL HISTORY: Past Surgical History  Procedure Laterality Date  . Cholecystectomy    . Video bronchoscopy with endobronchial ultrasound N/A 02/06/2014    Procedure: VIDEO BRONCHOSCOPY WITH ENDOBRONCHIAL ULTRASOUND,bronchial biopsies , node sampling;  Surgeon: Melrose Nakayama, MD;  Location: [place];  Service: Thoracic;  Laterality: N/A;  . Tubal ligation      SOCIAL HISTORY: Social History   Social History  . Marital Status: Married    Spouse Name: N/A  . Number of Children: N/A  . Years of Education: N/A   Occupational History  . Not on file.   Social History Main Topics  . Smoking status: Former Smoker -- 0.50 packs/day for 7 years    Types: Cigarettes    Quit date: 02/03/1989  . Smokeless tobacco: Never Used  . Alcohol Use: Yes     Comment: occasional  . Drug Use: No  . Sexual Activity: Not on file   Other Topics Concern  . Not on file   Social History Narrative  She is in a relationship. 3 children. Aged 32,35,20.  She has no grandchildren. She smoked in high school but quit after graduation. No ETOH. 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  She is originally from [place].  FAMILY HISTORY: Family History  Problem Relation Age of Onset  . Cancer Mother     UTERINE  . Cancer Father     LEUKEMIA  . Crohn's disease Son    indicated that her mother is alive. She indicated that her father is deceased. She indicated that her son is alive.   Father is deceased at age 69 from leukemia, mother is alive at 58 with a history of uterine cancer.  She has one sister how is healthy and one brother who is healthy. He works as a Engineer, structural.   ALLERGIES:  is allergic to asa.  MEDICATIONS:  Current Outpatient Prescriptions  Medication Sig Dispense Refill  . aspirin-acetaminophen-caffeine (EXCEDRIN MIGRAINE) 250-250-65 MG per tablet Take 2 tablets by mouth every 6 (six) hours as needed for headache.    Marland Kitchen  capecitabine (XELODA) 500 MG tablet Take 4 tablets twice daily for 14 days on, then take a seven day break and restart 112 tablet 6  . cyclobenzaprine (FLEXERIL) 10 MG tablet Take 1 tablet (10 mg total) by mouth every 6 (six) hours as needed for muscle spasms. 60 tablet 0  . escitalopram (LEXAPRO) 20 MG tablet Take 1/2 tablet by mouth daily for 5 days then increase to one tablet daily 30 tablet 6  . granisetron (KYTRIL) 1 MG tablet On days of taking Xeloda, take 1 tab 30 min prior to Xeloda in the am and pm. 56 tablet 3  . IRINOTECAN HCL IV Inject into the vein every 21 ( twenty-one) days.     Marland Kitchen lisinopril (PRINIVIL,ZESTRIL) 10 MG tablet Take 1 tablet (10 mg total) by mouth daily. 30 tablet 6  . metFORMIN (GLUCOPHAGE) 500 MG tablet Take 500 mg by mouth daily with breakfast.     . Multiple Vitamin (MULTIVITAMIN) capsule Take 1 capsule by mouth daily.    . pantoprazole (PROTONIX) 40 MG tablet Take 1 tablet (40 mg total) by mouth daily. 30 tablet 3  . rivaroxaban (XARELTO) 20 MG TABS tablet Take 1 tablet (20 mg total) by mouth daily with supper. 30 tablet 3  . Diphenhyd-Hydrocort-Nystatin (FIRST-DUKES MOUTHWASH) SUSP Use as  directed 5 mLs in the mouth or throat 4 (four) times daily as needed. (Patient not taking: Reported on 04/27/2015) 300 mL 2  . diphenoxylate-atropine (LOMOTIL) 2.5-0.025 MG per tablet Take 1 tablet by mouth every 4 (four) hours as needed for diarrhea or loose stools. (Patient not taking: Reported on 04/27/2015) 45 tablet 0  . loperamide (IMODIUM) 2 MG capsule Take 2 caps after 1st loose stool then 1 cap every 2 hours until you go 12 hours without having a loose stool. At bedtime for diarrhea, take 2 caps then 2 caps every 4 hours until morning. (Patient not taking: Reported on 04/27/2015) 60 capsule 0  . magic mouthwash SOLN Swish and swallow 66m by mouth every 4 hours as needed for mouth pain. (Patient not taking: Reported on 04/27/2015) 360 mL 1  . oxyCODONE-acetaminophen (PERCOCET/ROXICET) 5-325 MG per tablet Take 1 tablet by mouth every 4 (four) hours as needed for severe pain. (Patient not taking: Reported on 04/27/2015) 60 tablet 0  . promethazine (PHENERGAN) 25 MG suppository Place 1 suppository (25 mg total) rectally every 6 (six) hours as needed for nausea or vomiting. (Patient not taking: Reported on 04/04/2015) 12 each 0   No current facility-administered medications for this visit.    Review of Systems  Constitutional: Negative for fever, chills, weight loss and malaise/fatigue.  HENT: Negative for congestion, hearing loss, nosebleeds, sore throat and tinnitus.   Eyes: Negative for blurred vision, double vision, pain and discharge.  Respiratory: Negative for cough, hemoptysis, sputum production, shortness of breath and wheezing.   Cardiovascular: Negative for chest pain, palpitations, claudication, leg swelling and PND.  Gastrointestinal: Negative for heartburn, nausea, vomiting, abdominal pain, diarrhea, constipation, blood in stool and melena.  Genitourinary: Negative for dysuria, urgency, frequency and hematuria.  Musculoskeletal: Negative for myalgias, joint pain and falls.  Skin:  Negative for itching and rash.  Neurological: Negative for dizziness, tingling, tremors, sensory change, speech change, focal weakness, seizures, loss of consciousness, weakness. Endo/Heme/Allergies: Does not bruise/bleed easily.  Psychiatric/Behavioral: Negative for depression, suicidal ideas, memory loss and substance abuse. The patient is not nervous/anxious and does not have insomnia.   14 point review of systems was performed and is negative except  as detailed under history of present illness and above  PHYSICAL EXAMINATION: ECOG PERFORMANCE STATUS: 1 - Symptomatic but completely ambulatory  Filed Vitals:   04/27/15 0900  BP: 148/91  Pulse: 108  Temp: 98.5 F (36.9 C)  Resp: 20   Filed Weights   04/27/15 0900  Weight: 249 lb 6.4 oz (113.127 kg)    Physical Exam  Constitutional: She is oriented to person, place, and time and well-developed, well-nourished, and in no distress.  HENT:  Head: Normocephalic and atraumatic.  Nose: Nose normal.  Mouth/Throat: Oropharynx is clear and moist. No oropharyngeal exudate.  Eyes: Conjunctivae and EOM are normal. Pupils are equal, round, and reactive to light. Right eye exhibits no discharge. Left eye exhibits no discharge. No scleral icterus.  Neck: Normal range of motion. Neck supple. No tracheal deviation present. No thyromegaly present.  Cardiovascular: Normal rate, regular rhythm and normal heart sounds.  Exam reveals no gallop and no friction rub.  No murmur heard. Pulmonary/Chest: Effort normal and breath sounds normal. She has no wheezes. She has no rales.  Abdominal: Soft. Bowel sounds are normal. She exhibits no distension and no mass. There is no tenderness. There is no rebound and no guarding.  Musculoskeletal: Normal range of motion. She exhibits no edema.  Lymphadenopathy:    She has no cervical adenopathy.  Neurological: She is alert and oriented to person, place, and time. She has normal reflexes. No cranial nerve deficit.  Gait normal. Coordination normal.  Skin: Skin is warm and dry. No rash noted.  Psychiatric: Mood, memory, affect and judgment normal.  Nursing note and vitals reviewed.   LABORATORY DATA:  I have reviewed the data as listed.  CBC    Component Value Date/Time   WBC 10.5 04/24/2015 0928   RBC 4.16 04/24/2015 0928   RBC 4.36 11/16/2014 1214   HGB 12.9 04/24/2015 0928   HCT 39.1 04/24/2015 0928   PLT 227 04/24/2015 0928   MCV 94.0 04/24/2015 0928   MCH 31.0 04/24/2015 0928   MCHC 33.0 04/24/2015 0928   RDW 13.8 04/24/2015 0928   LYMPHSABS 3.2 04/24/2015 0928   MONOABS 0.8 04/24/2015 0928   EOSABS 0.2 04/24/2015 0928   BASOSABS 0.0 04/24/2015 0928    CMP     Component Value Date/Time   NA 138 04/24/2015 0928   K 3.9 04/24/2015 0928   CL 106 04/24/2015 0928   CO2 25 04/24/2015 0928   GLUCOSE 150* 04/24/2015 0928   BUN 9 04/24/2015 0928   CREATININE 0.70 04/24/2015 0928   CALCIUM 8.5* 04/24/2015 0928   PROT 7.7 04/24/2015 0928   ALBUMIN 3.6 04/24/2015 0928   AST 27 04/24/2015 0928   ALT 19 04/24/2015 0928   ALKPHOS 87 04/24/2015 0928   BILITOT 0.4 04/24/2015 0928   GFRNONAA >60 04/24/2015 0928   GFRAA >60 04/24/2015 0928   RADIOGRAPHIC STUDIES  Study Result     CLINICAL DATA: Subsequent encounter for colon cancer diagnoses 2015 with lung metastases.  EXAM: CT CHEST, ABDOMEN, AND PELVIS WITH CONTRAST  TECHNIQUE: Multidetector CT imaging of the chest, abdomen and pelvis was performed following the standard protocol during bolus administration of intravenous contrast.  CONTRAST: 124m OMNIPAQUE IOHEXOL 300 MG/ML SOLN  COMPARISON: 02/19/2015  FINDINGS: CT CHEST FINDINGS  Mediastinum/Lymph Nodes: There is no axillary lymphadenopathy. 10 mm short axis precarinal lymph node measured previously is stable at 9 mm 10 mm. 18 mm short axis subcarinal lymph node on the previous study is now 19 mm. No evidence  for hilar lymphadenopathy. The heart size  is normal. No pericardial effusion. The esophagus has normal CT imaging features.  Lungs/Pleura: 16 x 14 mm right upper lobe index lesion on the previous study now measures 17 x 14 mm.  Index nodule in the left upper lobe measured previously at 10 x 10 mm now measures 11 x 14 mm.  A second index nodule in the right upper lobe was measured previously at 20 x 17 mm compared to 23 x 18 mm today.  Dominant lung lesion is in the right lower lobe and was measured previously at 3.9 x 4.2 cm which compares to 4.1 x 4.7 cm today.  Numerous other bilateral pulmonary nodules are evident. No focal airspace consolidation. No pulmonary edema or pleural effusion.  Musculoskeletal: Bone windows reveal no worrisome lytic or sclerotic osseous lesions.  CT ABDOMEN PELVIS FINDINGS  Hepatobiliary: 7 mm very subtle hypo attenuating focus is identified in the dome of the right liver (image 36 series 2). This is too small to definitively characterize. 9 mm hypo attenuating lesion is seen at the extreme inferior tip of the right liver and appears to be new in the interval. A third tiny hypo attenuating focus is seen in the right liver on image 53 of series 2, also apparently new in the interval. Gallbladder is surgically absent. No intrahepatic or extrahepatic biliary dilation.  Pancreas: No focal mass lesion. No dilatation of the main duct. No intraparenchymal cyst. No peripancreatic edema.  Spleen: No splenomegaly. No focal mass lesion.  Adrenals/Urinary Tract: No adrenal nodule or mass. Kidneys are normal in appearance bilaterally. No evidence for hydroureter. The urinary bladder appears normal for the degree of distention.  Stomach/Bowel: Stomach is nondistended. No gastric wall thickening. No evidence of outlet obstruction. Duodenum is normally positioned as is the ligament of Treitz. No small bowel wall thickening. No small bowel dilatation. The terminal ileum is normal. The  appendix is normal. A few scattered diverticuli are seen in the colon. There is an area of wall thickening identified in the mid sigmoid colon (see image 87 series 2).  Vascular/Lymphatic: No abdominal aortic aneurysm. There is no gastrohepatic or hepatoduodenal ligament lymphadenopathy. No intraperitoneal or retroperitoneal lymphadenopy.  Multiple lymph nodes are seen in the sigmoid mesocolon, adjacent to the area of wall thickening. Imaging features are highly suspicious for metastatic nodal spread.  Reproductive: The uterus has normal CT imaging appearance. There is no adnexal mass.  Other: No intraperitoneal free fluid.  Musculoskeletal: Bone windows reveal no worrisome lytic or sclerotic osseous lesions.  IMPRESSION: 1. Mild interval progression of bilateral pulmonary metastases. 2. No substantial interval change in mediastinal lymphadenopathy. 3. Interval development of tiny, very subtle low-density foci in the liver parenchyma. This raises concern for interval development of hepatic metastases. Liver MRI without and with contrast could be used to further assess, as clinically warranted. 4. Apparent wall thickening in the mid sigmoid colon is more prominent than on the previous study. Adjacent borderline enlarged lymph nodes in the sigmoid mesocolon are suspicious for metastatic disease.   Electronically Signed  By: Misty Stanley M.D.  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  At times the patient seems to be very accepting of her disease and prognosis and other times she is very difficult time addressing it. She  currently has very good quality of life and is very active.   Thus far she is doing well on new therapy. Goal is to continue  XELIRI, I have advised her it can be more toxic than FOLFIRI but she absolutely does not want to wear an infusion pump.  She has had problems in the past with XELODA but is currently doing well. If she tolerates Cycle #1 without difficulty we will proceed with the addition of AVASTIN.  I advised Erica Barker to start on some Miralax or Colace to help stimulate her bowel movements  I reminded her that if she develops redness, soreness of the hands and feet, or mouth sores, that she should just stop the pills, and call us for follow-up.  All questions were answered. The patient knows to call the clinic with any problems, questions or concerns.    This document serves as a record of services personally performed by Ancil Linsey, MD. It was created on her behalf by Toni Amend, a trained medical scribe. The creation of this record is based on the scribe's personal observations and the provider's statements to them. This document has been checked and approved by the attending provider.  I have reviewed the above documentation for accuracy and completeness, and I agree with the above.  Kelby Fam. Whitney Muse, MD

## 2015-05-01 NOTE — Progress Notes (Signed)
No PCP Per Patient No address on file  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 [facility]) - Plan: CBC with Differential, Comprehensive metabolic panel, CEA  CURRENT THERAPY: Xeliri beginning on 04/24/2015 (patient refusing 5FU pump).  Adding Avastin for cycle 2 on 11/1.  Reducing Xeloda dose to 1500 mg BID days 1-14 every 21 days.  INTERVAL HISTORY: Erica Barker 54 y.o. female returns for followup of Stage IV colon cancer.    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 [facility])   02/20/2014 Imaging CT of abdomen and pelvis on 02/20/2014 at [facility] showing 3.6 cm apple core lesion in the proximal sigmoid colon 11 mm hypoenhancing lesion in the medial left hepatic dome, 4.1 cm right lower lobe mass and adjacent right lower lobe nodule   04/17/2014 Imaging Right upper extremity DVT 04/17/2014 paired brachial veins, axillary vein, and peripheral aspect of the right subclavian vein   07/06/2014 Imaging CT chest 07/06/2014 with bilateral pulmonary nodules and masses, RUL, lateral RUL, posterior LUL, lobulated nodule in the subpleural RLL   08/18/2014 Tumor Marker CEA 93.7 ng/ml   10/09/2014 Initial Diagnosis 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   10/11/2014 - 01/29/2015 Chemotherapy FOLFOX + Avastin beginning at [facility]   01/29/2015 Imaging Port study- Unremarkable Port-A-Cath injection.   02/19/2015 Imaging Progressive pulmonary metastatic disease as detailed above. Stable mediastinal lymphadenopathy. No abdominal/pelvic metastatic disease is identified   04/24/2015 -  Chemotherapy XELIRI started on 04/24/2015. Patient did not want to wear 5-FU pump.  On 2000 mg BID 14 days on and 7 days off.   04/28/2015 Adverse Reaction Mild stomatitis.  Xeloda held.  Treated with Magic Mouthwash.  Resolved by 05/02/2015 at office visit.    Treatment Plan Change Will restart Xeloda at 1500 mg BID 14 days on and 7 days off (11/1)    I personally reviewed and went over laboratory results with the patient.  The results  are noted within this dictation.   She reports that after starting Xeloda, she developed mouth sores.  She held her Xeloda on Saturday.  Today, her sores are resolved.  She was treated symptomatically with Magic Mouthwash.  She notes some constipation surrounding the time of her taking Xeloda.  She is advised to use a stool softener to help avoid this.  She notes that she feels great today.   Past Medical History  Diagnosis Date  . Hypertension   . Depression   . Pulmonary nodules 01/30/14    CTA CHEST  . Lymphadenopathy, mediastinal 01/2014    CTA ANGIO .Marland KitchenLamar  . Morbid obesity ([place])   . Diabetes mellitus, type II ([place])   . Headache(784.0)     has Hypertension; Depression; Pulmonary nodules; Lymphadenopathy, mediastinal; Diabetes mellitus type 2 in obese ([place]); Obesity, morbid ([place]); and 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 [facility]) on her problem list.     is allergic to asa.  Current Outpatient Prescriptions on File Prior to Visit  Medication Sig Dispense Refill  . aspirin-acetaminophen-caffeine (EXCEDRIN MIGRAINE) 250-250-65 MG per tablet Take 2 tablets by mouth every 6 (six) hours as needed for headache.    . cyclobenzaprine (FLEXERIL) 10 MG tablet Take 1 tablet (10 mg total) by mouth every 6 (six) hours as needed for muscle spasms. 60 tablet 0  . Diphenhyd-Hydrocort-Nystatin (FIRST-DUKES MOUTHWASH) SUSP Use as directed 5 mLs in the mouth or throat 4 (four) times daily as needed. 300 mL 2  . lisinopril (PRINIVIL,ZESTRIL) 10 MG tablet Take 1 tablet (10 mg total)  by mouth daily. 30 tablet 6  . magic mouthwash SOLN Swish and swallow 48m by mouth every 4 hours as needed for mouth pain. 360 mL 1  . Multiple Vitamin (MULTIVITAMIN) capsule Take 1 capsule by mouth daily.    .Marland KitchenoxyCODONE-acetaminophen (PERCOCET/ROXICET) 5-325 MG per tablet Take 1 tablet by mouth every 4 (four) hours as needed for severe pain. 60 tablet 0  . pantoprazole (PROTONIX) 40 MG tablet Take 1 tablet (40 mg  total) by mouth daily. 30 tablet 3  . rivaroxaban (XARELTO) 20 MG TABS tablet Take 1 tablet (20 mg total) by mouth daily with supper. 30 tablet 3  . capecitabine (XELODA) 500 MG tablet Take 4 tablets twice daily for 14 days on, then take a seven day break and restart (Patient not taking: Reported on 05/02/2015) 112 tablet 6  . diphenoxylate-atropine (LOMOTIL) 2.5-0.025 MG per tablet Take 1 tablet by mouth every 4 (four) hours as needed for diarrhea or loose stools. (Patient not taking: Reported on 04/27/2015) 45 tablet 0  . escitalopram (LEXAPRO) 20 MG tablet Take 1/2 tablet by mouth daily for 5 days then increase to one tablet daily (Patient not taking: Reported on 05/02/2015) 30 tablet 6  . granisetron (KYTRIL) 1 MG tablet On days of taking Xeloda, take 1 tab 30 min prior to Xeloda in the am and pm. (Patient not taking: Reported on 05/02/2015) 56 tablet 3  . IRINOTECAN HCL IV Inject into the vein every 21 ( twenty-one) days.     .Marland Kitchenloperamide (IMODIUM) 2 MG capsule Take 2 caps after 1st loose stool then 1 cap every 2 hours until you go 12 hours without having a loose stool. At bedtime for diarrhea, take 2 caps then 2 caps every 4 hours until morning. (Patient not taking: Reported on 04/27/2015) 60 capsule 0  . metFORMIN (GLUCOPHAGE) 500 MG tablet Take 500 mg by mouth daily with breakfast.     . promethazine (PHENERGAN) 25 MG suppository Place 1 suppository (25 mg total) rectally every 6 (six) hours as needed for nausea or vomiting. (Patient not taking: Reported on 04/04/2015) 12 each 0   No current facility-administered medications on file prior to visit.    Past Surgical History  Procedure Laterality Date  . Cholecystectomy    . Video bronchoscopy with endobronchial ultrasound N/A 02/06/2014    Procedure: VIDEO BRONCHOSCOPY WITH ENDOBRONCHIAL ULTRASOUND,bronchial biopsies , node sampling;  Surgeon: SMelrose Nakayama MD;  [place] [facility]  Service: Thoracic;  Laterality: N/A;  . Tubal ligation       Denies any headaches, dizziness, double vision, fevers, chills, night sweats, nausea, vomiting, diarrhea, chest pain, heart palpitations, shortness of breath, blood in stool, black tarry stool, urinary pain, urinary burning, urinary frequency, hematuria.   PHYSICAL EXAMINATION  ECOG PERFORMANCE STATUS: 1 - Symptomatic but completely ambulatory  Filed Vitals:   05/02/15 1045  BP: 122/82  Pulse: 85  Temp: 98.1 F (36.7 C)  Resp: 16    GENERAL:alert, no distress, well nourished, well developed, comfortable, cooperative, obese and smiling SKIN: skin color, texture, turgor are normal, no rashes or significant lesions HEAD: Normocephalic, No masses, lesions, tenderness or abnormalities EYES: normal, PERRLA, EOMI, Conjunctiva are pink and non-injected EARS: External ears normal OROPHARYNX:lips, buccal mucosa, and tongue normal and mucous membranes are moist  NECK: supple, no adenopathy, thyroid normal size, non-tender, without nodularity, no stridor, non-tender, trachea midline LYMPH:  no palpable lymphadenopathy BREAST:not examined LUNGS: clear to auscultation  HEART: regular rate & rhythm, no murmurs, no  gallops, S1 normal and S2 normal ABDOMEN:abdomen soft, non-tender, obese and normal bowel sounds BACK: Back symmetric, no curvature., No CVA tenderness EXTREMITIES:less then 2 second capillary refill, no joint deformities, effusion, or inflammation, no skin discoloration, no clubbing, no cyanosis  NEURO: alert & oriented x 3 with fluent speech, no focal motor/sensory deficits, gait normal    LABORATORY DATA: CBC    Component Value Date/Time   WBC 10.5 04/24/2015 0928   RBC 4.16 04/24/2015 0928   RBC 4.36 11/16/2014 1214   HGB 12.9 04/24/2015 0928   HCT 39.1 04/24/2015 0928   PLT 227 04/24/2015 0928   MCV 94.0 04/24/2015 0928   MCH 31.0 04/24/2015 0928   MCHC 33.0 04/24/2015 0928   RDW 13.8 04/24/2015 0928   LYMPHSABS 3.2 04/24/2015 0928   MONOABS 0.8 04/24/2015 0928     EOSABS 0.2 04/24/2015 0928   BASOSABS 0.0 04/24/2015 0928      Chemistry      Component Value Date/Time   NA 138 04/24/2015 0928   K 3.9 04/24/2015 0928   CL 106 04/24/2015 0928   CO2 25 04/24/2015 0928   BUN 9 04/24/2015 0928   CREATININE 0.70 04/24/2015 0928      Component Value Date/Time   CALCIUM 8.5* 04/24/2015 0928   ALKPHOS 87 04/24/2015 0928   AST 27 04/24/2015 0928   ALT 19 04/24/2015 0928   BILITOT 0.4 04/24/2015 0928     Lab Results  Component Value Date   CEA 318.1* 04/24/2015    PENDING LABS:   RADIOGRAPHIC STUDIES:  Ct Chest W Contrast  04/02/2015  CLINICAL DATA:  Subsequent encounter for colon cancer diagnoses 2015 with lung metastases. EXAM: CT CHEST, ABDOMEN, AND PELVIS WITH CONTRAST TECHNIQUE: Multidetector CT imaging of the chest, abdomen and pelvis was performed following the standard protocol during bolus administration of intravenous contrast. CONTRAST:  168m OMNIPAQUE IOHEXOL 300 MG/ML  SOLN COMPARISON:  02/19/2015 FINDINGS: CT CHEST FINDINGS Mediastinum/Lymph Nodes: There is no axillary lymphadenopathy. 10 mm short axis precarinal lymph node measured previously is stable at 9 mm 10 mm. 18 mm short axis subcarinal lymph node on the previous study is now 19 mm. No evidence for hilar lymphadenopathy. The heart size is normal. No pericardial effusion. The esophagus has normal CT imaging features. Lungs/Pleura: 16 x 14 mm right upper lobe index lesion on the previous study now measures 17 x 14 mm. Index nodule in the left upper lobe measured previously at 10 x 10 mm now measures 11 x 14 mm. A second index nodule in the right upper lobe was measured previously at 20 x 17 mm compared to 23 x 18 mm today. Dominant lung lesion is in the right lower lobe and was measured previously at 3.9 x 4.2 cm which compares to 4.1 x 4.7 cm today. Numerous other bilateral pulmonary nodules are evident. No focal airspace consolidation. No pulmonary edema or pleural effusion.  Musculoskeletal: Bone windows reveal no worrisome lytic or sclerotic osseous lesions. CT ABDOMEN PELVIS FINDINGS Hepatobiliary: 7 mm very subtle hypo attenuating focus is identified in the dome of the right liver (image 36 series 2). This is too small to definitively characterize. 9 mm hypo attenuating lesion is seen at the extreme inferior tip of the right liver and appears to be new in the interval. A third tiny hypo attenuating focus is seen in the right liver on image 53 of series 2, also apparently new in the interval. Gallbladder is surgically absent. No intrahepatic or extrahepatic biliary  dilation. Pancreas: No focal mass lesion. No dilatation of the main duct. No intraparenchymal cyst. No peripancreatic edema. Spleen: No splenomegaly. No focal mass lesion. Adrenals/Urinary Tract: No adrenal nodule or mass. Kidneys are normal in appearance bilaterally. No evidence for hydroureter. The urinary bladder appears normal for the degree of distention. Stomach/Bowel: Stomach is nondistended. No gastric wall thickening. No evidence of outlet obstruction. Duodenum is normally positioned as is the ligament of Treitz. No small bowel wall thickening. No small bowel dilatation. The terminal ileum is normal. The appendix is normal. A few scattered diverticuli are seen in the colon. There is an area of wall thickening identified in the mid sigmoid colon (see image 87 series 2). Vascular/Lymphatic: No abdominal aortic aneurysm. There is no gastrohepatic or hepatoduodenal ligament lymphadenopathy. No intraperitoneal or retroperitoneal lymphadenopy. Multiple lymph nodes are seen in the sigmoid mesocolon, adjacent to the area of wall thickening. Imaging features are highly suspicious for metastatic nodal spread. Reproductive: The uterus has normal CT imaging appearance. There is no adnexal mass. Other: No intraperitoneal free fluid. Musculoskeletal: Bone windows reveal no worrisome lytic or sclerotic osseous lesions.  IMPRESSION: 1. Mild interval progression of bilateral pulmonary metastases. 2. No substantial interval change in mediastinal lymphadenopathy. 3. Interval development of tiny, very subtle low-density foci in the liver parenchyma. This raises concern for interval development of hepatic metastases. Liver MRI without and with contrast could be used to further assess, as clinically warranted. 4. Apparent wall thickening in the mid sigmoid colon is more prominent than on the previous study. Adjacent borderline enlarged lymph nodes in the sigmoid mesocolon are suspicious for metastatic disease. Electronically Signed   By: Misty Stanley M.D.   On: 04/02/2015 16:44   Ct Abdomen Pelvis W Contrast  04/02/2015  CLINICAL DATA:  Subsequent encounter for colon cancer diagnoses 2015 with lung metastases. EXAM: CT CHEST, ABDOMEN, AND PELVIS WITH CONTRAST TECHNIQUE: Multidetector CT imaging of the chest, abdomen and pelvis was performed following the standard protocol during bolus administration of intravenous contrast. CONTRAST:  114m OMNIPAQUE IOHEXOL 300 MG/ML  SOLN COMPARISON:  02/19/2015 FINDINGS: CT CHEST FINDINGS Mediastinum/Lymph Nodes: There is no axillary lymphadenopathy. 10 mm short axis precarinal lymph node measured previously is stable at 9 mm 10 mm. 18 mm short axis subcarinal lymph node on the previous study is now 19 mm. No evidence for hilar lymphadenopathy. The heart size is normal. No pericardial effusion. The esophagus has normal CT imaging features. Lungs/Pleura: 16 x 14 mm right upper lobe index lesion on the previous study now measures 17 x 14 mm. Index nodule in the left upper lobe measured previously at 10 x 10 mm now measures 11 x 14 mm. A second index nodule in the right upper lobe was measured previously at 20 x 17 mm compared to 23 x 18 mm today. Dominant lung lesion is in the right lower lobe and was measured previously at 3.9 x 4.2 cm which compares to 4.1 x 4.7 cm today. Numerous other bilateral  pulmonary nodules are evident. No focal airspace consolidation. No pulmonary edema or pleural effusion. Musculoskeletal: Bone windows reveal no worrisome lytic or sclerotic osseous lesions. CT ABDOMEN PELVIS FINDINGS Hepatobiliary: 7 mm very subtle hypo attenuating focus is identified in the dome of the right liver (image 36 series 2). This is too small to definitively characterize. 9 mm hypo attenuating lesion is seen at the extreme inferior tip of the right liver and appears to be new in the interval. A third tiny hypo attenuating focus  is seen in the right liver on image 53 of series 2, also apparently new in the interval. Gallbladder is surgically absent. No intrahepatic or extrahepatic biliary dilation. Pancreas: No focal mass lesion. No dilatation of the main duct. No intraparenchymal cyst. No peripancreatic edema. Spleen: No splenomegaly. No focal mass lesion. Adrenals/Urinary Tract: No adrenal nodule or mass. Kidneys are normal in appearance bilaterally. No evidence for hydroureter. The urinary bladder appears normal for the degree of distention. Stomach/Bowel: Stomach is nondistended. No gastric wall thickening. No evidence of outlet obstruction. Duodenum is normally positioned as is the ligament of Treitz. No small bowel wall thickening. No small bowel dilatation. The terminal ileum is normal. The appendix is normal. A few scattered diverticuli are seen in the colon. There is an area of wall thickening identified in the mid sigmoid colon (see image 87 series 2). Vascular/Lymphatic: No abdominal aortic aneurysm. There is no gastrohepatic or hepatoduodenal ligament lymphadenopathy. No intraperitoneal or retroperitoneal lymphadenopy. Multiple lymph nodes are seen in the sigmoid mesocolon, adjacent to the area of wall thickening. Imaging features are highly suspicious for metastatic nodal spread. Reproductive: The uterus has normal CT imaging appearance. There is no adnexal mass. Other: No intraperitoneal free  fluid. Musculoskeletal: Bone windows reveal no worrisome lytic or sclerotic osseous lesions. IMPRESSION: 1. Mild interval progression of bilateral pulmonary metastases. 2. No substantial interval change in mediastinal lymphadenopathy. 3. Interval development of tiny, very subtle low-density foci in the liver parenchyma. This raises concern for interval development of hepatic metastases. Liver MRI without and with contrast could be used to further assess, as clinically warranted. 4. Apparent wall thickening in the mid sigmoid colon is more prominent than on the previous study. Adjacent borderline enlarged lymph nodes in the sigmoid mesocolon are suspicious for metastatic disease. Electronically Signed   By: Misty Stanley M.D.   On: 04/02/2015 16:44     PATHOLOGY:    ASSESSMENT AND PLAN:  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 Stage IV Colon Cancer, beginning XELIRI on 04/24/2015 with progression of diease on CT imaging.  Oncology history is up-to-date.  Goal is to add Avastin to her treatment if cycle 1 is tolerated.  She is educated that [place] is typically less tolerated and has higher risk of toxicity rather than 5FU IV.  She is adamant that she does not want a pump.  She knows to call us with any excessive sores in mouth and/or redness/pain of palms and soles of feet.  She will stop XELODA and call us for follow-up.  She did not tolerate 2000 mg of Xeloda BID.  She developed mild stomatitis.  She was advised to hold the medication.  Magic Mouthwash was prescribed for symptom management.  Today, it has resolved.  As a result, she will continue to hold Xeloda for the time being.  She will restart Xeloda as scheduled on 11/1 with cycle 2 of treatment at a reduced dose of 1500 mg BID days 1-14 with 7 day respite following every 21 days.  If tolerated, we can slowly increase the dose.  A new Rx for Xeloda was printed and delivered to [employer].  I have built and Avastin antibody plan as well.  This  will start on 11/1 with cycle 2.  I have messaged Lendell Caprice to get approval for this treatment.  I have looked up the appropriate dosing for this regimen and it is 7.5 mg/kg.    Continue to hold Xeloda until she is due for her next cycle of therapy,  11/1.  Return for follow-up on 11/1 and to start cycle 2 of treatment.  At that time, she will begin 1500 mg BID of Xeloda days 1-14 very 21 days.  She declines influenza vaccine today.    THERAPY PLAN:  Continue with treatment as outlined above.  All questions were answered. The patient knows to call the clinic with any problems, questions or concerns. We can certainly see the patient much sooner if necessary.  Patient and plan discussed with Dr. Ancil Linsey and she is in agreement with the aforementioned.   This note is electronically signed by: Doy Mince 05/02/2015 11:35 AM

## 2015-05-01 NOTE — Assessment & Plan Note (Addendum)
Stage IV Colon Cancer, beginning XELIRI on 04/24/2015 with progression of diease on CT imaging.  Oncology history is up-to-date.  Goal is to add Avastin to her treatment if cycle 1 is tolerated.  She is educated that [place] is typically less tolerated and has higher risk of toxicity rather than 5FU IV.  She is adamant that she does not want a pump.  She knows to call us with any excessive sores in mouth and/or redness/pain of palms and soles of feet.  She will stop XELODA and call us for follow-up.  She did not tolerate 2000 mg of Xeloda BID.  She developed mild stomatitis.  She was advised to hold the medication.  Magic Mouthwash was prescribed for symptom management.  Today, it has resolved.  As a result, she will continue to hold Xeloda for the time being.  She will restart Xeloda as scheduled on 11/1 with cycle 2 of treatment at a reduced dose of 1500 mg BID days 1-14 with 7 day respite following every 21 days.  If tolerated, we can slowly increase the dose.  A new Rx for Xeloda was printed and delivered to [employer].  I have built and Avastin antibody plan as well.  This will start on 11/1 with cycle 2.  I have messaged Lendell Caprice to get approval for this treatment.  I have looked up the appropriate dosing for this regimen and it is 7.5 mg/kg.    Continue to hold Xeloda until she is due for her next cycle of therapy, 11/1.  Return for follow-up on 11/1 and to start cycle 2 of treatment.  At that time, she will begin 1500 mg BID of Xeloda days 1-14 very 21 days.  She declines influenza vaccine today.

## 2015-05-02 ENCOUNTER — Encounter (HOSPITAL_COMMUNITY): Payer: Self-pay | Admitting: Oncology

## 2015-05-02 ENCOUNTER — Encounter (HOSPITAL_BASED_OUTPATIENT_CLINIC_OR_DEPARTMENT_OTHER): Payer: Medicaid Other | Admitting: Oncology

## 2015-05-02 ENCOUNTER — Other Ambulatory Visit (HOSPITAL_COMMUNITY): Payer: Self-pay | Admitting: Oncology

## 2015-05-02 VITALS — BP 122/82 | HR 85 | Temp 98.1°F | Resp 16 | Wt 250.3 lb

## 2015-05-02 DIAGNOSIS — C189 Malignant neoplasm of colon, unspecified: Secondary | ICD-10-CM

## 2015-05-02 DIAGNOSIS — C7800 Secondary malignant neoplasm of unspecified lung: Principal | ICD-10-CM

## 2015-05-02 MED ORDER — CAPECITABINE 500 MG PO TABS: ORAL_TABLET | ORAL | Status: DC

## 2015-05-02 NOTE — Patient Instructions (Addendum)
[place] at [facility] Discharge Instructions  RECOMMENDATIONS MADE BY THE CONSULTANT AND ANY TEST RESULTS WILL BE SENT TO YOUR REFERRING PHYSICIAN.  Exam and discussion by Robynn Pane, PA-C Will decrease your xeloda to 1500 mg twice daily days 1 thru 14 every 21 days.  To begin on 05/15/15 Will add avastin to your next treatment.  Follow-up on 11/1 for office visit and treatment.  Thank you for choosing [place] at [facility] to provide your oncology and hematology care.  To afford each patient quality time with our provider, please arrive at least 15 minutes before your scheduled appointment time.    You need to re-schedule your appointment should you arrive 10 or more minutes late.  We strive to give you quality time with our providers, and arriving late affects you and other patients whose appointments are after yours.  Also, if you no show three or more times for appointments you may be dismissed from the clinic at the providers discretion.     Again, thank you for choosing [facility].  Our hope is that these requests will decrease the amount of time that you wait before being seen by our physicians.       _____________________________________________________________  Should you have questions after your visit to [facility], please contact our office at [phone] between the hours of 8:30 a.m. and 4:30 p.m.  Voicemails left after 4:30 p.m. will not be returned until the following business day.  For prescription refill requests, have your pharmacy contact our office.    Bevacizumab injection What is this medicine? BEVACIZUMAB (be va SIZ yoo mab) is a monoclonal antibody. It is used to treat cervical cancer, colorectal cancer, glioblastoma multiforme, non-small cell lung cancer (NSCLC), ovarian cancer, and renal cell cancer. This medicine may be used for other purposes; ask your health care provider or  pharmacist if you have questions. What should I tell my health care provider before I take this medicine? They need to know if you have any of these conditions: -blood clots -heart disease, including heart failure, heart attack, or chest pain (angina) -high blood pressure -infection (especially a virus infection such as chickenpox, cold sores, or herpes) -kidney disease -lung disease -prior chemotherapy with doxorubicin, daunorubicin, epirubicin, or other anthracycline type chemotherapy agents -recent or ongoing radiation therapy -recent surgery -stroke -an unusual or allergic reaction to bevacizumab, hamster proteins, mouse proteins, other medicines, foods, dyes, or preservatives -pregnant or trying to get pregnant -breast-feeding How should I use this medicine? This medicine is for infusion into a vein. It is given by a health care professional in a hospital or clinic setting. Talk to your pediatrician regarding the use of this medicine in children. Special care may be needed. Overdosage: If you think you have taken too much of this medicine contact a poison control center or emergency room at once. NOTE: This medicine is only for you. Do not share this medicine with others. What if I miss a dose? It is important not to miss your dose. Call your doctor or health care professional if you are unable to keep an appointment. What may interact with this medicine? Interactions are not expected. This list may not describe all possible interactions. Give your health care provider a list of all the medicines, herbs, non-prescription drugs, or dietary supplements you use. Also tell them if you smoke, drink alcohol, or use illegal drugs. Some items may interact with your  medicine. What should I watch for while using this medicine? Your condition will be monitored carefully while you are receiving this medicine. You will need important blood work and urine testing done while you are taking this  medicine. During your treatment, let your health care professional know if you have any unusual symptoms, such as difficulty breathing. This medicine may rarely cause 'gastrointestinal perforation' (holes in the stomach, intestines or colon), a serious side effect requiring surgery to repair. This medicine should be started at least 28 days following major surgery and the site of the surgery should be totally healed. Check with your doctor before scheduling dental work or surgery while you are receiving this treatment. Talk to your doctor if you have recently had surgery or if you have a wound that has not healed. Do not become pregnant while taking this medicine or for 6 months after stopping it. Women should inform their doctor if they wish to become pregnant or think they might be pregnant. There is a potential for serious side effects to an unborn child. Talk to your health care professional or pharmacist for more information. Do not breast-feed an infant while taking this medicine. This medicine has caused ovarian failure in some women. This medicine may interfere with the ability to have a child. You should talk to your doctor or health care professional if you are concerned about your fertility. What side effects may I notice from receiving this medicine? Side effects that you should report to your doctor or health care professional as soon as possible: -allergic reactions like skin rash, itching or hives, swelling of the face, lips, or tongue -signs of infection - fever or chills, cough, sore throat, pain or trouble passing urine -signs of decreased platelets or bleeding - bruising, pinpoint red spots on the skin, black, tarry stools, nosebleeds, blood in the urine -breathing problems -changes in vision -chest pain -confusion -jaw pain, especially after dental work -mouth sores -seizures -severe abdominal pain -severe headache -sudden numbness or weakness of the face, arm or  leg -swelling of legs or ankles -symptoms of a stroke: change in mental awareness, inability to talk or move one side of the body (especially in patients with lung cancer) -trouble passing urine or change in the amount of urine -trouble speaking or understanding -trouble walking, dizziness, loss of balance or coordination Side effects that usually do not require medical attention (report to your doctor or health care professional if they continue or are bothersome): -constipation -diarrhea -dry skin -headache -loss of appetite -nausea, vomiting This list may not describe all possible side effects. Call your doctor for medical advice about side effects. You may report side effects to FDA at 1-800-FDA-1088. Where should I keep my medicine? This drug is given in a hospital or clinic and will not be stored at home. NOTE: This sheet is a summary. It may not cover all possible information. If you have questions about this medicine, talk to your doctor, pharmacist, or health care provider.    2016, Elsevier/Gold Standard. (2014-08-29 16:58:44)

## 2015-05-08 ENCOUNTER — Inpatient Hospital Stay (HOSPITAL_COMMUNITY)

## 2015-05-08 ENCOUNTER — Ambulatory Visit (HOSPITAL_COMMUNITY): Payer: Medicaid Other | Admitting: Hematology & Oncology

## 2015-05-08 ENCOUNTER — Inpatient Hospital Stay (HOSPITAL_COMMUNITY): Payer: Medicaid Other

## 2015-05-10 ENCOUNTER — Encounter (HOSPITAL_COMMUNITY)

## 2015-05-14 NOTE — Assessment & Plan Note (Addendum)
Stage IV Colon Cancer, beginning XELIRI on 04/24/2015 with progression of diease on CT imaging.  Xeloda dosing was reduced to 1500 mg BID, days 1-14 every 21 days beginning on 05/15/2015 (did not tolerate 2000 mg BID Days 1-14 ecery 21 days with stomatitis).  Oncology history is updated.  Avastin is added to her treatment plan and will begin today which is officially cycle 2 of treatment.  She will start Xeloda 1500 mg BID days 1-14 today as well.  She is educated that [place] is typically less tolerated and has higher risk of toxicity rather than 5FU IV.  She is adamant that she does not want a pump.  She knows to call us with any excessive sores in mouth and/or redness/pain of palms and soles of feet.  She will stop XELODA and call us for follow-up.  She is to call with any complications associated with treatment.  She is educated on side effects of Xeloda.  Risks of side effects should be reduced with recent dose reduction for cycle #2.  She knows to hold Xeloda and call the clinic with any Xeloda-induced complications.  She has a left upper eyelid stye.  She is using warm compressed and OTC stye medication.  She is to continue with this supportive intervention.  Lupita Raider was able to provide the patient with some education regarding Avastin therapy.  Return in 21 days for follow-up.

## 2015-05-14 NOTE — Progress Notes (Signed)
No PCP Per Patient No address on file  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 [facility]) - Plan: Bevacizumab (AVASTIN IV), Urinalysis, dipstick only  CURRENT THERAPY: Xeliri beginning on 04/24/2015 (patient refusing 5FU pump).  Adding Avastin for cycle 2 on 11/1.  Reduced Xeloda dose to 1500 mg BID days 1-14 every 21 days.  INTERVAL HISTORY: Erica Barker 54 y.o. female returns for followup of Stage IV colon cancer.    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 [facility])   02/20/2014 Imaging CT of abdomen and pelvis on 02/20/2014 at [facility] showing 3.6 cm apple core lesion in the proximal sigmoid colon 11 mm hypoenhancing lesion in the medial left hepatic dome, 4.1 cm right lower lobe mass and adjacent right lower lobe nodule   04/17/2014 Imaging Right upper extremity DVT 04/17/2014 paired brachial veins, axillary vein, and peripheral aspect of the right subclavian vein   07/06/2014 Imaging CT chest 07/06/2014 with bilateral pulmonary nodules and masses, RUL, lateral RUL, posterior LUL, lobulated nodule in the subpleural RLL   08/18/2014 Tumor Marker CEA 93.7 ng/ml   10/09/2014 Initial Diagnosis 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   10/11/2014 - 01/29/2015 Chemotherapy FOLFOX + Avastin beginning at [facility]   01/29/2015 Imaging Port study- Unremarkable Port-A-Cath injection.   02/19/2015 Imaging Progressive pulmonary metastatic disease as detailed above. Stable mediastinal lymphadenopathy. No abdominal/pelvic metastatic disease is identified   04/24/2015 -  Chemotherapy XELIRI started on 04/24/2015. Patient did not want to wear 5-FU pump.  On 2000 mg BID 14 days on and 7 days off.   04/28/2015 Adverse Reaction Mild stomatitis.  Xeloda held.  Treated with Magic Mouthwash.  Resolved by 05/02/2015 at office visit.   05/15/2015 Treatment Plan Change Will restart Xeloda at 1500 mg BID 14 days on and 7 days off (11/1)    I personally reviewed and went over laboratory results with the patient.  The results  are noted within this dictation.   She requested re-education on Avastin and I was more than glad to go over that with her.  She is starting Avastin today.  She is informed that she got it before with her prior chemotherapy.  I reviewed the side effects of this medication.  Lupita Raider, Nurse Navigator was available to provide a mini-teaching session for [place].  She was very appreciative of this.  She has a left upper eyelid stye.  She is using warm compresses and OTC stye medication.  She notes that it is tender to palpation, but otherwise it does not cause any symptoms, other than aesthetic issues which she does not like.   Past Medical History  Diagnosis Date  . Hypertension   . Depression   . Pulmonary nodules 01/30/14    CTA CHEST  . Lymphadenopathy, mediastinal 01/2014    CTA ANGIO [place]  . Morbid obesity ([place])   . Diabetes mellitus, type II ([place])   . Headache(784.0)     has Hypertension; Depression; Pulmonary nodules; Lymphadenopathy, mediastinal; Diabetes mellitus type 2 in obese ([place]); Obesity, morbid ([place]); and 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 [facility]) on her problem list.     is allergic to asa.  Current Outpatient Prescriptions on File Prior to Visit  Medication Sig Dispense Refill  . aspirin-acetaminophen-caffeine (EXCEDRIN MIGRAINE) 250-250-65 MG per tablet Take 2 tablets by mouth every 6 (six) hours as needed for headache.    . capecitabine (XELODA) 500 MG tablet Take 3 tablets twice daily for 14 days on, then take a seven  day break and restart 84 tablet 2  . diphenoxylate-atropine (LOMOTIL) 2.5-0.025 MG per tablet Take 1 tablet by mouth every 4 (four) hours as needed for diarrhea or loose stools. 45 tablet 0  . escitalopram (LEXAPRO) 20 MG tablet Take 1/2 tablet by mouth daily for 5 days then increase to one tablet daily 30 tablet 6  . granisetron (KYTRIL) 1 MG tablet On days of taking Xeloda, take 1 tab 30 min prior to Xeloda in the am and pm. 56 tablet 3    . IRINOTECAN HCL IV Inject into the vein every 21 ( twenty-one) days.     Mar[place] Kitchen lisinopril (PRINIVIL,ZESTRIL) 10 MG tablet Take 1 tablet (10 mg total) by mouth daily. 30 tablet 6  . Multiple Vitamin (MULTIVITAMIN) capsule Take 1 capsule by mouth daily.    [place] oxyCODONE-acetaminophen (PERCOCET/ROXICET) 5-325 MG per tablet Take 1 tablet by mouth every 4 (four) hours as needed for severe pain. 60 tablet 0  . pantoprazole (PROTONIX) 40 MG tablet Take 1 tablet (40 mg total) by mouth daily. 30 tablet 3  . promethazine (PHENERGAN) 25 MG suppository Place 1 suppository (25 mg total) rectally every 6 (six) hours as needed for nausea or vomiting. 12 each 0  . rivaroxaban (XARELTO) 20 MG TABS tablet Take 1 tablet (20 mg total) by mouth daily with supper. 30 tablet 3  . cyclobenzaprine (FLEXERIL) 10 MG tablet Take 1 tablet (10 mg total) by mouth every 6 (six) hours as needed for muscle spasms. (Patient not taking: Reported on 05/15/2015) 60 tablet 0  . Diphenhyd-Hydrocort-Nystatin (FIRST-DUKES MOUTHWASH) SUSP Use as directed 5 mLs in the mouth or throat 4 (four) times daily as needed. (Patient not taking: Reported on 05/15/2015) 300 mL 2  . loperamide (IMODIUM) 2 MG capsule Take 2 caps after 1st loose stool then 1 cap every 2 hours until you go 12 hours without having a loose stool. At bedtime for diarrhea, take 2 caps then 2 caps every 4 hours until morning. (Patient not taking: Reported on 04/27/2015) 60 capsule 0  . magic mouthwash SOLN Swish and swallow 39m by mouth every 4 hours as needed for mouth pain. (Patient not taking: Reported on 05/15/2015) 360 mL 1  . metFORMIN (GLUCOPHAGE) 500 MG tablet Take 500 mg by mouth daily with breakfast.      No current facility-administered medications on file prior to visit.    Past Surgical History  Procedure Laterality Date  . Cholecystectomy    . Video bronchoscopy with endobronchial ultrasound N/A 02/06/2014    Procedure: VIDEO BRONCHOSCOPY WITH ENDOBRONCHIAL  ULTRASOUND,bronchial biopsies , node sampling;  Surgeon: SMelrose Nakayama MD;  [facility]  Service: Thoracic;  Laterality: N/A;  . Tubal ligation      Denies any headaches, dizziness, double vision, fevers, chills, night sweats, nausea, vomiting, diarrhea, chest pain, heart palpitations, shortness of breath, blood in stool, black tarry stool, urinary pain, urinary burning, urinary frequency, hematuria.   PHYSICAL EXAMINATION  ECOG PERFORMANCE STATUS: 1 - Symptomatic but completely ambulatory  Filed Vitals:   05/15/15 0909  BP: 126/83  Pulse: 83  Temp: 98 F (36.7 C)  Resp: 18    GENERAL:alert, no distress, well nourished, well developed, comfortable, cooperative, obese and smiling SKIN: skin color, texture, turgor are normal, no rashes or significant lesions HEAD: Normocephalic, No masses, lesions, tenderness or abnormalities EYES: normal, PERRLA, EOMI, Conjunctiva are pink and non-injected.  Left upper eyelid nodule with erythema, new. EARS: External ears normal OROPHARYNX:lips, buccal mucosa, and tongue  normal and mucous membranes are moist  NECK: supple, no adenopathy, thyroid normal size, non-tender, without nodularity, no stridor, non-tender, trachea midline LYMPH:  no palpable lymphadenopathy BREAST:not examined LUNGS: clear to auscultation  HEART: regular rate & rhythm, no murmurs, no gallops, S1 normal and S2 normal ABDOMEN:abdomen soft, non-tender, obese and normal bowel sounds BACK: Back symmetric, no curvature., No CVA tenderness EXTREMITIES:less then 2 second capillary refill, no joint deformities, effusion, or inflammation, no skin discoloration, no clubbing, no cyanosis  NEURO: alert & oriented x 3 with fluent speech, no focal motor/sensory deficits, gait normal    LABORATORY DATA: CBC    Component Value Date/Time   WBC 8.1 05/15/2015 0928   RBC 3.95 05/15/2015 0928   RBC 4.36 11/16/2014 1214   HGB 12.1 05/15/2015 0928   HCT 37.0 05/15/2015 0928     PLT 240 05/15/2015 0928   MCV 93.7 05/15/2015 0928   MCH 30.6 05/15/2015 0928   MCHC 32.7 05/15/2015 0928   RDW 14.2 05/15/2015 0928   LYMPHSABS 2.8 05/15/2015 0928   MONOABS 0.6 05/15/2015 0928   EOSABS 0.2 05/15/2015 0928   BASOSABS 0.0 05/15/2015 0928      Chemistry      Component Value Date/Time   NA 138 05/15/2015 0928   K 3.6 05/15/2015 0928   CL 104 05/15/2015 0928   CO2 26 05/15/2015 0928   BUN 12 05/15/2015 0928   CREATININE 0.85 05/15/2015 0928      Component Value Date/Time   CALCIUM 9.4 05/15/2015 0928   ALKPHOS 84 05/15/2015 0928   AST 27 05/15/2015 0928   ALT 29 05/15/2015 0928   BILITOT 0.5 05/15/2015 0928     Lab Results  Component Value Date   CEA 318.1* 04/24/2015    PENDING LABS:   RADIOGRAPHIC STUDIES:  No results found.   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  Xeloda dosing was reduced to 1500 mg BID, days 1-14 every 21 days beginning on 05/15/2015 (did not tolerate 2000 mg BID Days 1-14 ecery 21 days with stomatitis).  Oncology history is updated.  Avastin is added to her treatment plan and will begin today which is officially cycle 2 of treatment.  She will start Xeloda 1500 mg BID days 1-14 today as well.  She is educated that [place] is typically less tolerated and has higher risk of toxicity rather than 5FU IV.  She is adamant that she does not want a pump.  She knows to call us with any excessive sores in mouth and/or redness/pain of palms and soles of feet.  She will stop XELODA and call us for follow-up.  She is to call with any complications associated with treatment.  She is educated on side effects of Xeloda.  Risks of side effects should be reduced with recent dose reduction for cycle #2.  She knows to hold Xeloda and call the clinic with any Xeloda-induced complications.  She has a left upper eyelid  stye.  She is using warm compressed and OTC stye medication.  She is to continue with this supportive intervention.  Lupita Raider was able to provide the patient with some education regarding Avastin therapy.  Return in 21 days for follow-up.    THERAPY PLAN:  Continue with treatment as outlined above.  All questions were answered. The patient knows to call the clinic with any problems, questions or concerns.  We can certainly see the patient much sooner if necessary.  Patient and plan discussed with Dr. Ancil Linsey and she is in agreement with the aforementioned.   This note is electronically signed by: Doy Mince 05/15/2015 12:28 PM

## 2015-05-15 ENCOUNTER — Encounter: Payer: Self-pay | Admitting: *Deleted

## 2015-05-15 ENCOUNTER — Encounter (HOSPITAL_BASED_OUTPATIENT_CLINIC_OR_DEPARTMENT_OTHER)

## 2015-05-15 ENCOUNTER — Encounter (HOSPITAL_COMMUNITY): Attending: Oncology | Admitting: Oncology

## 2015-05-15 VITALS — BP 126/83 | HR 83 | Temp 98.0°F | Resp 18 | Wt 256.6 lb

## 2015-05-15 VITALS — BP 140/94 | HR 70 | Temp 98.1°F | Resp 18

## 2015-05-15 DIAGNOSIS — C189 Malignant neoplasm of colon, unspecified: Secondary | ICD-10-CM

## 2015-05-15 DIAGNOSIS — C7800 Secondary malignant neoplasm of unspecified lung: Secondary | ICD-10-CM | POA: Insufficient documentation

## 2015-05-15 DIAGNOSIS — Z5111 Encounter for antineoplastic chemotherapy: Secondary | ICD-10-CM

## 2015-05-15 DIAGNOSIS — Z5112 Encounter for antineoplastic immunotherapy: Secondary | ICD-10-CM

## 2015-05-15 DIAGNOSIS — H00014 Hordeolum externum left upper eyelid: Secondary | ICD-10-CM | POA: Diagnosis not present

## 2015-05-15 LAB — COMPREHENSIVE METABOLIC PANEL
ALK PHOS: 84 U/L (ref 38–126)
ALT: 29 U/L (ref 14–54)
AST: 27 U/L (ref 15–41)
Albumin: 3.5 g/dL (ref 3.5–5.0)
Anion gap: 8 (ref 5–15)
BUN: 12 mg/dL (ref 6–20)
CALCIUM: 9.4 mg/dL (ref 8.9–10.3)
CO2: 26 mmol/L (ref 22–32)
CREATININE: 0.85 mg/dL (ref 0.44–1.00)
Chloride: 104 mmol/L (ref 101–111)
Glucose, Bld: 185 mg/dL — ABNORMAL HIGH (ref 65–99)
Potassium: 3.6 mmol/L (ref 3.5–5.1)
Sodium: 138 mmol/L (ref 135–145)
Total Bilirubin: 0.5 mg/dL (ref 0.3–1.2)
Total Protein: 7.3 g/dL (ref 6.5–8.1)

## 2015-05-15 LAB — CBC WITH DIFFERENTIAL/PLATELET
BASOS ABS: 0 10*3/uL (ref 0.0–0.1)
Basophils Relative: 0 %
Eosinophils Absolute: 0.2 10*3/uL (ref 0.0–0.7)
Eosinophils Relative: 2 %
HCT: 37 % (ref 36.0–46.0)
HEMOGLOBIN: 12.1 g/dL (ref 12.0–15.0)
LYMPHS ABS: 2.8 10*3/uL (ref 0.7–4.0)
LYMPHS PCT: 34 %
MCH: 30.6 pg (ref 26.0–34.0)
MCHC: 32.7 g/dL (ref 30.0–36.0)
MCV: 93.7 fL (ref 78.0–100.0)
Monocytes Absolute: 0.6 10*3/uL (ref 0.1–1.0)
Monocytes Relative: 7 %
NEUTROS ABS: 4.5 10*3/uL (ref 1.7–7.7)
NEUTROS PCT: 57 %
Platelets: 240 10*3/uL (ref 150–400)
RBC: 3.95 MIL/uL (ref 3.87–5.11)
RDW: 14.2 % (ref 11.5–15.5)
WBC: 8.1 10*3/uL (ref 4.0–10.5)

## 2015-05-15 LAB — URINALYSIS, ROUTINE W REFLEX MICROSCOPIC
BILIRUBIN URINE: NEGATIVE
GLUCOSE, UA: 500 mg/dL — AB
Ketones, ur: NEGATIVE mg/dL
Leukocytes, UA: NEGATIVE
Nitrite: NEGATIVE
Protein, ur: NEGATIVE mg/dL
SPECIFIC GRAVITY, URINE: 1.02 (ref 1.005–1.030)
UROBILINOGEN UA: 0.2 mg/dL (ref 0.0–1.0)
pH: 6 (ref 5.0–8.0)

## 2015-05-15 LAB — URINE MICROSCOPIC-ADD ON

## 2015-05-15 MED ORDER — SODIUM CHLORIDE 0.9 % IV SOLN
Freq: Once | INTRAVENOUS | Status: AC
  Administered 2015-05-15: 11:00:00 via INTRAVENOUS

## 2015-05-15 MED ORDER — SODIUM CHLORIDE 0.9 % IV SOLN
7.5000 mg/kg | Freq: Once | INTRAVENOUS | Status: AC
  Administered 2015-05-15: 850 mg via INTRAVENOUS
  Filled 2015-05-15: qty 28

## 2015-05-15 MED ORDER — SODIUM CHLORIDE 0.9 % IJ SOLN: 10.0000 mL | INTRAMUSCULAR | Status: DC | PRN

## 2015-05-15 MED ORDER — PALONOSETRON HCL INJECTION 0.25 MG/5ML
0.2500 mg | Freq: Once | INTRAVENOUS | Status: AC
  Administered 2015-05-15: 0.25 mg via INTRAVENOUS
  Filled 2015-05-15: qty 5

## 2015-05-15 MED ORDER — HEPARIN SOD (PORK) LOCK FLUSH 100 UNIT/ML IV SOLN
500.0000 [IU] | Freq: Once | INTRAVENOUS | Status: AC | PRN
  Administered 2015-05-15: 500 [IU]
  Filled 2015-05-15: qty 5

## 2015-05-15 MED ORDER — ATROPINE SULFATE 1 MG/ML IJ SOLN
0.5000 mg | Freq: Once | INTRAMUSCULAR | Status: AC | PRN
  Administered 2015-05-15: 0.5 mg via INTRAVENOUS
  Filled 2015-05-15: qty 1

## 2015-05-15 MED ORDER — IRINOTECAN HCL CHEMO INJECTION 100 MG/5ML
200.0000 mg/m2 | Freq: Once | INTRAVENOUS | Status: AC
  Administered 2015-05-15: 326 mg via INTRAVENOUS
  Filled 2015-05-15: qty 5.43

## 2015-05-15 MED ORDER — SODIUM CHLORIDE 0.9 % IV SOLN
Freq: Once | INTRAVENOUS | Status: AC
  Administered 2015-05-15: 11:00:00 via INTRAVENOUS
  Filled 2015-05-15: qty 5

## 2015-05-15 NOTE — Progress Notes (Signed)
[place] chemotherapy today Discharged via wheelchair

## 2015-05-15 NOTE — Progress Notes (Signed)
[facility] Clinical Social Work  Clinical Social Work was referred by CSW rounding for assessment of psychosocial needs due to CSW checking in. Clinical Social Worker met briefly with patient at [facility] to offer support and assess for needs.  Pt was on the phone, but denied current needs. She agrees to reach out as needed.   Clinical Social Work interventions: Check in   Grier Hock, LCSW [facility] Tuesdays   [phone]  

## 2015-05-15 NOTE — Patient Instructions (Addendum)
[place] at [facility] Discharge Instructions  RECOMMENDATIONS MADE BY THE CONSULTANT AND ANY TEST RESULTS WILL BE SENT TO YOUR REFERRING PHYSICIAN.  Warm compresses to left eye for stye. Do not try to pop it on your own. If it gets bothersome you may want to see a dermatologist or eye doctor. Keep using your stye ointment that you purchased over-the-counter. Use good handwashing prior to messing with the stye.   Xeloda  -- please reduce dosage to '1500mg'$  in the morning and '1500mg'$  in the evening on days 1-14 every 21 days.   Today we are adding Avastin to your treatment plan. You received this in the past with your FOLFOX treatment.   Contact us for any problems.     Thank you for choosing [place] at [facility] to provide your oncology and hematology care.  To afford each patient quality time with our provider, please arrive at least 15 minutes before your scheduled appointment time.    You need to re-schedule your appointment should you arrive 10 or more minutes late.  We strive to give you quality time with our providers, and arriving late affects you and other patients whose appointments are after yours.  Also, if you no show three or more times for appointments you may be dismissed from the clinic at the providers discretion.     Again, thank you for choosing [facility].  Our hope is that these requests will decrease the amount of time that you wait before being seen by our physicians.       _____________________________________________________________  Should you have questions after your visit to [facility], please contact our office at [phone] between the hours of 8:30 a.m. and 4:30 p.m.  Voicemails left after 4:30 p.m. will not be returned until the following business day.  For prescription refill requests, have your pharmacy contact our office.   Stye A stye is a bump on your eyelid caused by a  bacterial infection. A stye can form inside the eyelid (internal stye) or outside the eyelid (external stye). An internal stye may be caused by an infected oil-producing gland inside your eyelid. An external stye may be caused by an infection at the base of your eyelash (hair follicle). Styes are very common. Anyone can get them at any age. They usually occur in just one eye, but you may have more than one in either eye.  CAUSES  The infection is almost always caused by bacteria called Staphylococcus aureus. This is a common type of bacteria that lives on your skin. RISK FACTORS You may be at higher risk for a stye if you have had one before. You may also be at higher risk if you have:  Diabetes.  Long-term illness.  Long-term eye redness.  A skin condition called seborrhea.  High fat levels in your blood (lipids). SIGNS AND SYMPTOMS  Eyelid pain is the most common symptom of a stye. Internal styes are more painful than external styes. Other signs and symptoms may include:  Painful swelling of your eyelid.  A scratchy feeling in your eye.  Tearing and redness of your eye.  Pus draining from the stye. DIAGNOSIS  Your health care provider may be able to diagnose a stye just by examining your eye. The health care provider may also check to make sure:  You do not have a fever or other signs of a more serious infection.  The infection  has not spread to other parts of your eye or areas around your eye. TREATMENT  Most styes will clear up in a few days without treatment. In some cases, you may need to use antibiotic drops or ointment to prevent infection. Your health care provider may have to drain the stye surgically if your stye is:  Large.  Causing a lot of pain.  Interfering with your vision. This can be done using a thin blade or a needle.  HOME CARE INSTRUCTIONS   Take medicines only as directed by your health care provider.  Apply a clean, warm compress to your eye for 10  minutes, 4 times a day.  Do not wear contact lenses or eye makeup until your stye has healed.  Do not try to pop or drain the stye. SEEK MEDICAL CARE IF:  You have chills or a fever.  Your stye does not go away after several days.  Your stye affects your vision.  Your eyeball becomes swollen, red, or painful. MAKE SURE YOU:  Understand these instructions.  Will watch your condition.  Will get help right away if you are not doing well or get worse.   This information is not intended to replace advice given to you by your health care provider. Make sure you discuss any questions you have with your health care provider.   Document Released: 04/09/2005 Document Revised: 07/21/2014 Document Reviewed: 10/14/2013 Elsevier Interactive Patient Education 2016 [place]. Bevacizumab injection What is this medicine? BEVACIZUMAB (be va SIZ yoo mab) is a monoclonal antibody. It is used to treat cervical cancer, colorectal cancer, glioblastoma multiforme, non-small cell lung cancer (NSCLC), ovarian cancer, and renal cell cancer. This medicine may be used for other purposes; ask your health care provider or pharmacist if you have questions. What should I tell my health care provider before I take this medicine? They need to know if you have any of these conditions: -blood clots -heart disease, including heart failure, heart attack, or chest pain (angina) -high blood pressure -infection (especially a virus infection such as chickenpox, cold sores, or herpes) -kidney disease -lung disease -prior chemotherapy with doxorubicin, daunorubicin, epirubicin, or other anthracycline type chemotherapy agents -recent or ongoing radiation therapy -recent surgery -stroke -an unusual or allergic reaction to bevacizumab, hamster proteins, mouse proteins, other medicines, foods, dyes, or preservatives -pregnant or trying to get pregnant -breast-feeding How should I use this medicine? This medicine is  for infusion into a vein. It is given by a health care professional in a hospital or clinic setting. Talk to your pediatrician regarding the use of this medicine in children. Special care may be needed. Overdosage: If you think you have taken too much of this medicine contact a poison control center or emergency room at once. NOTE: This medicine is only for you. Do not share this medicine with others. What if I miss a dose? It is important not to miss your dose. Call your doctor or health care professional if you are unable to keep an appointment. What may interact with this medicine? Interactions are not expected. This list may not describe all possible interactions. Give your health care provider a list of all the medicines, herbs, non-prescription drugs, or dietary supplements you use. Also tell them if you smoke, drink alcohol, or use illegal drugs. Some items may interact with your medicine. What should I watch for while using this medicine? Your condition will be monitored carefully while you are receiving this medicine. You will need important blood work  and urine testing done while you are taking this medicine. During your treatment, let your health care professional know if you have any unusual symptoms, such as difficulty breathing. This medicine may rarely cause 'gastrointestinal perforation' (holes in the stomach, intestines or colon), a serious side effect requiring surgery to repair. This medicine should be started at least 28 days following major surgery and the site of the surgery should be totally healed. Check with your doctor before scheduling dental work or surgery while you are receiving this treatment. Talk to your doctor if you have recently had surgery or if you have a wound that has not healed. Do not become pregnant while taking this medicine or for 6 months after stopping it. Women should inform their doctor if they wish to become pregnant or think they might be pregnant.  There is a potential for serious side effects to an unborn child. Talk to your health care professional or pharmacist for more information. Do not breast-feed an infant while taking this medicine. This medicine has caused ovarian failure in some women. This medicine may interfere with the ability to have a child. You should talk to your doctor or health care professional if you are concerned about your fertility. What side effects may I notice from receiving this medicine? Side effects that you should report to your doctor or health care professional as soon as possible: -allergic reactions like skin rash, itching or hives, swelling of the face, lips, or tongue -signs of infection - fever or chills, cough, sore throat, pain or trouble passing urine -signs of decreased platelets or bleeding - bruising, pinpoint red spots on the skin, black, tarry stools, nosebleeds, blood in the urine -breathing problems -changes in vision -chest pain -confusion -jaw pain, especially after dental work -mouth sores -seizures -severe abdominal pain -severe headache -sudden numbness or weakness of the face, arm or leg -swelling of legs or ankles -symptoms of a stroke: change in mental awareness, inability to talk or move one side of the body (especially in patients with lung cancer) -trouble passing urine or change in the amount of urine -trouble speaking or understanding -trouble walking, dizziness, loss of balance or coordination Side effects that usually do not require medical attention (report to your doctor or health care professional if they continue or are bothersome): -constipation -diarrhea -dry skin -headache -loss of appetite -nausea, vomiting This list may not describe all possible side effects. Call your doctor for medical advice about side effects. You may report side effects to FDA at 1-800-FDA-1088. Where should I keep my medicine? This drug is given in a hospital or clinic and will not be  stored at home. NOTE: This sheet is a summary. It may not cover all possible information. If you have questions about this medicine, talk to your doctor, pharmacist, or health care provider.    2016, Elsevier/Gold Standard. (2014-08-29 16:58:44)

## 2015-05-15 NOTE — Patient Instructions (Signed)
[facility] Discharge Instructions for Patients Receiving Chemotherapy  Today you received the following chemotherapy agents irinotecan and avastin Please follow up as scheduled Call the clinic if you have any questions or concerns  To help prevent nausea and vomiting after your treatment, we encourage you to take your nausea medication  If you develop nausea and vomiting, or diarrhea that is not controlled by your medication, call the clinic.  The clinic phone number is [phone]. Office hours are Monday-Friday 8:30am-5:00pm.  BELOW ARE SYMPTOMS THAT SHOULD BE REPORTED IMMEDIATELY:  *FEVER GREATER THAN 101.0 F  *CHILLS WITH OR WITHOUT FEVER  NAUSEA AND VOMITING THAT IS NOT CONTROLLED WITH YOUR NAUSEA MEDICATION  *UNUSUAL SHORTNESS OF BREATH  *UNUSUAL BRUISING OR BLEEDING  TENDERNESS IN MOUTH AND THROAT WITH OR WITHOUT PRESENCE OF ULCERS  *URINARY PROBLEMS  *BOWEL PROBLEMS  UNUSUAL RASH Items with * indicate a potential emergency and should be followed up as soon as possible. If you have an emergency after office hours please contact your primary care physician or go to the nearest emergency department.  Please call the clinic during office hours if you have any questions or concerns.   You may also contact the Patient Navigator at [phone] should you have any questions or need assistance in obtaining follow up care. _____________________________________________________________________ Have you asked about our STAR program?    STAR stands for Survivorship Training and Rehabilitation, and this is a nationally recognized cancer care program that focuses on survivorship and rehabilitation.  Cancer and cancer treatments may cause problems, such as, pain, making you feel tired and keeping you from doing the things that you need or want to do. Cancer rehabilitation can help. Our goal is to reduce these troubling effects and help you have the best quality of  life possible.  You may receive a survey from a nurse that asks questions about your current state of health.  Based on the survey results, all eligible patients will be referred to the [facility] program for an evaluation so we can better serve you! A frequently asked questions sheet is available upon request.      Bevacizumab injection What is this medicine? BEVACIZUMAB (be va SIZ yoo mab) is a monoclonal antibody. It is used to treat cervical cancer, colorectal cancer, glioblastoma multiforme, non-small cell lung cancer (NSCLC), ovarian cancer, and renal cell cancer. This medicine may be used for other purposes; ask your health care provider or pharmacist if you have questions. What should I tell my health care provider before I take this medicine? They need to know if you have any of these conditions: -blood clots -heart disease, including heart failure, heart attack, or chest pain (angina) -high blood pressure -infection (especially a virus infection such as chickenpox, cold sores, or herpes) -kidney disease -lung disease -prior chemotherapy with doxorubicin, daunorubicin, epirubicin, or other anthracycline type chemotherapy agents -recent or ongoing radiation therapy -recent surgery -stroke -an unusual or allergic reaction to bevacizumab, hamster proteins, mouse proteins, other medicines, foods, dyes, or preservatives -pregnant or trying to get pregnant -breast-feeding How should I use this medicine? This medicine is for infusion into a vein. It is given by a health care professional in a hospital or clinic setting. Talk to your pediatrician regarding the use of this medicine in children. Special care may be needed. Overdosage: If you think you have taken too much of this medicine contact a poison control center or emergency room at once. NOTE: This medicine is only for you.  Do not share this medicine with others. What if I miss a dose? It is important not to miss your dose. Call  your doctor or health care professional if you are unable to keep an appointment. What may interact with this medicine? Interactions are not expected. This list may not describe all possible interactions. Give your health care provider a list of all the medicines, herbs, non-prescription drugs, or dietary supplements you use. Also tell them if you smoke, drink alcohol, or use illegal drugs. Some items may interact with your medicine. What should I watch for while using this medicine? Your condition will be monitored carefully while you are receiving this medicine. You will need important blood work and urine testing done while you are taking this medicine. During your treatment, let your health care professional know if you have any unusual symptoms, such as difficulty breathing. This medicine may rarely cause 'gastrointestinal perforation' (holes in the stomach, intestines or colon), a serious side effect requiring surgery to repair. This medicine should be started at least 28 days following major surgery and the site of the surgery should be totally healed. Check with your doctor before scheduling dental work or surgery while you are receiving this treatment. Talk to your doctor if you have recently had surgery or if you have a wound that has not healed. Do not become pregnant while taking this medicine or for 6 months after stopping it. Women should inform their doctor if they wish to become pregnant or think they might be pregnant. There is a potential for serious side effects to an unborn child. Talk to your health care professional or pharmacist for more information. Do not breast-feed an infant while taking this medicine. This medicine has caused ovarian failure in some women. This medicine may interfere with the ability to have a child. You should talk to your doctor or health care professional if you are concerned about your fertility. What side effects may I notice from receiving this  medicine? Side effects that you should report to your doctor or health care professional as soon as possible: -allergic reactions like skin rash, itching or hives, swelling of the face, lips, or tongue -signs of infection - fever or chills, cough, sore throat, pain or trouble passing urine -signs of decreased platelets or bleeding - bruising, pinpoint red spots on the skin, black, tarry stools, nosebleeds, blood in the urine -breathing problems -changes in vision -chest pain -confusion -jaw pain, especially after dental work -mouth sores -seizures -severe abdominal pain -severe headache -sudden numbness or weakness of the face, arm or leg -swelling of legs or ankles -symptoms of a stroke: change in mental awareness, inability to talk or move one side of the body (especially in patients with lung cancer) -trouble passing urine or change in the amount of urine -trouble speaking or understanding -trouble walking, dizziness, loss of balance or coordination Side effects that usually do not require medical attention (report to your doctor or health care professional if they continue or are bothersome): -constipation -diarrhea -dry skin -headache -loss of appetite -nausea, vomiting This list may not describe all possible side effects. Call your doctor for medical advice about side effects. You may report side effects to FDA at 1-800-FDA-1088. Where should I keep my medicine? This drug is given in a hospital or clinic and will not be stored at home. NOTE: This sheet is a summary. It may not cover all possible information. If you have questions about this medicine, talk to your doctor, pharmacist,  or health care provider.    2016, Elsevier/Gold Standard. (2014-08-29 16:58:44)     Irinotecan injection What is this medicine? IRINOTECAN (ir in oh TEE kan ) is a chemotherapy drug. It is used to treat colon and rectal cancer. This medicine may be used for other purposes; ask your health  care provider or pharmacist if you have questions. What should I tell my health care provider before I take this medicine? They need to know if you have any of these conditions: -blood disorders -dehydration -diarrhea -infection (especially a virus infection such as chickenpox, cold sores, or herpes) -liver disease -low blood counts, like low white cell, platelet, or red cell counts -recent or ongoing radiation therapy -an unusual or allergic reaction to irinotecan, sorbitol, other chemotherapy, other medicines, foods, dyes, or preservatives -pregnant or trying to get pregnant -breast-feeding How should I use this medicine? This drug is given as an infusion into a vein. It is administered in a hospital or clinic by a specially trained health care professional. Talk to your pediatrician regarding the use of this medicine in children. Special care may be needed. Overdosage: If you think you have taken too much of this medicine contact a poison control center or emergency room at once. NOTE: This medicine is only for you. Do not share this medicine with others. What if I miss a dose? It is important not to miss your dose. Call your doctor or health care professional if you are unable to keep an appointment. What may interact with this medicine? Do not take this medicine with any of the following medications: -atazanavir -certain medicines for fungal infections like itraconazole and ketoconazole -St. John's Wort This medicine may also interact with the following medications: -dexamethasone -diuretics -laxatives -medicines for seizures like carbamazepine, mephobarbital, phenobarbital, phenytoin, primidone -medicines to increase blood counts like filgrastim, pegfilgrastim, sargramostim -prochlorperazine -vaccines This list may not describe all possible interactions. Give your health care provider a list of all the medicines, herbs, non-prescription drugs, or dietary supplements you use.  Also tell them if you smoke, drink alcohol, or use illegal drugs. Some items may interact with your medicine. What should I watch for while using this medicine? Your condition will be monitored carefully while you are receiving this medicine. You will need important blood work done while you are taking this medicine. This drug may make you feel generally unwell. This is not uncommon, as chemotherapy can affect healthy cells as well as cancer cells. Report any side effects. Continue your course of treatment even though you feel ill unless your doctor tells you to stop. In some cases, you may be given additional medicines to help with side effects. Follow all directions for their use. You may get drowsy or dizzy. Do not drive, use machinery, or do anything that needs mental alertness until you know how this medicine affects you. Do not stand or sit up quickly, especially if you are an older patient. This reduces the risk of dizzy or fainting spells. Call your doctor or health care professional for advice if you get a fever, chills or sore throat, or other symptoms of a cold or flu. Do not treat yourself. This drug decreases your body's ability to fight infections. Try to avoid being around people who are sick. This medicine may increase your risk to bruise or bleed. Call your doctor or health care professional if you notice any unusual bleeding. Be careful brushing and flossing your teeth or using a toothpick because you may get an  infection or bleed more easily. If you have any dental work done, tell your dentist you are receiving this medicine. Avoid taking products that contain aspirin, acetaminophen, ibuprofen, naproxen, or ketoprofen unless instructed by your doctor. These medicines may hide a fever. Do not become pregnant while taking this medicine. Women should inform their doctor if they wish to become pregnant or think they might be pregnant. There is a potential for serious side effects to an  unborn child. Talk to your health care professional or pharmacist for more information. Do not breast-feed an infant while taking this medicine. What side effects may I notice from receiving this medicine? Side effects that you should report to your doctor or health care professional as soon as possible: -allergic reactions like skin rash, itching or hives, swelling of the face, lips, or tongue -low blood counts - this medicine may decrease the number of white blood cells, red blood cells and platelets. You may be at increased risk for infections and bleeding. -signs of infection - fever or chills, cough, sore throat, pain or difficulty passing urine -signs of decreased platelets or bleeding - bruising, pinpoint red spots on the skin, black, tarry stools, blood in the urine -signs of decreased red blood cells - unusually weak or tired, fainting spells, lightheadedness -breathing problems -chest pain -diarrhea -feeling faint or lightheaded, falls -flushing, runny nose, sweating during infusion -mouth sores or pain -pain, swelling, redness or irritation where injected -pain, swelling, warmth in the leg -pain, tingling, numbness in the hands or feet -problems with balance, talking, walking -stomach cramps, pain -trouble passing urine or change in the amount of urine -vomiting as to be unable to hold down drinks or food -yellowing of the eyes or skin Side effects that usually do not require medical attention (report to your doctor or health care professional if they continue or are bothersome): -constipation -hair loss -headache -loss of appetite -nausea, vomiting -stomach upset This list may not describe all possible side effects. Call your doctor for medical advice about side effects. You may report side effects to FDA at 1-800-FDA-1088. Where should I keep my medicine? This drug is given in a hospital or clinic and will not be stored at home. NOTE: This sheet is a summary. It may not  cover all possible information. If you have questions about this medicine, talk to your doctor, pharmacist, or health care provider.    2016, Elsevier/Gold Standard. (2012-12-27 16:29:32)

## 2015-05-16 LAB — CEA: CEA: 286 ng/mL — AB (ref 0.0–4.7)

## 2015-05-22 ENCOUNTER — Inpatient Hospital Stay (HOSPITAL_COMMUNITY): Payer: Medicaid Other

## 2015-05-22 ENCOUNTER — Ambulatory Visit (HOSPITAL_COMMUNITY): Payer: Medicaid Other | Admitting: Hematology & Oncology

## 2015-06-05 ENCOUNTER — Inpatient Hospital Stay (HOSPITAL_COMMUNITY): Payer: Medicaid Other

## 2015-06-05 ENCOUNTER — Ambulatory Visit (HOSPITAL_COMMUNITY): Payer: Medicaid Other | Admitting: Oncology

## 2015-06-10 NOTE — Progress Notes (Signed)
No PCP Per Patient No address on file  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 [facility])  CURRENT THERAPY: Xeliri beginning on 04/24/2015 (patient refusing 5FU pump).  Adding Avastin for cycle 2 on 11/1.  Reduced Xeloda dose to 1500 mg BID days 1-14 every 21 days.  INTERVAL HISTORY: Erica Barker 54 y.o. female returns for followup of Stage IV colon cancer.    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 [facility])   02/20/2014 Imaging CT of abdomen and pelvis on 02/20/2014 at [facility] showing 3.6 cm apple core lesion in the proximal sigmoid colon 11 mm hypoenhancing lesion in the medial left hepatic dome, 4.1 cm right lower lobe mass and adjacent right lower lobe nodule   04/17/2014 Imaging Right upper extremity DVT 04/17/2014 paired brachial veins, axillary vein, and peripheral aspect of the right subclavian vein   07/06/2014 Imaging CT chest 07/06/2014 with bilateral pulmonary nodules and masses, RUL, lateral RUL, posterior LUL, lobulated nodule in the subpleural RLL   08/18/2014 Tumor Marker CEA 93.7 ng/ml   10/09/2014 Initial Diagnosis 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   10/11/2014 - 01/29/2015 Chemotherapy FOLFOX + Avastin beginning at [facility]   01/29/2015 Imaging Port study- Unremarkable Port-A-Cath injection.   02/19/2015 Imaging Progressive pulmonary metastatic disease as detailed above. Stable mediastinal lymphadenopathy. No abdominal/pelvic metastatic disease is identified   04/24/2015 -  Chemotherapy XELIRI started on 04/24/2015. Patient did not want to wear 5-FU pump.  On 2000 mg BID 14 days on and 7 days off.   04/28/2015 Adverse Reaction Mild stomatitis.  Xeloda held.  Treated with Magic Mouthwash.  Resolved by 05/02/2015 at office visit.   05/15/2015 Treatment Plan Change Will restart Xeloda at 1500 mg BID 14 days on and 7 days off (11/1)    I personally reviewed and went over laboratory results with the patient.  The results are noted within this dictation.   She notes some nausea  with vomiting for the first 2 days following treatment.  "What did you do differently this time?"  The only difference was the addition of Avastin which should not be contributing to her nausea.  She notes that for 2 days post-treatment, she had a sour taste in her mouth.  She "made myself throw up" 3-4 times over those 2 days and she felt much improved.  She used her PO antiemetics at home, but they were not very effective.  She reports that suppositories work best for her.  She did not call the clinic with these reported issues.  She is advised to do so moving forward.    She started her Xeloda this AM.  She notes resolution of her left eye stye.  Past Medical History  Diagnosis Date  . Hypertension   . Depression   . Pulmonary nodules 01/30/14    CTA CHEST  . Lymphadenopathy, mediastinal 01/2014    [place]IO [place]  . Morbid obesity ([place])   . Diabetes mellitus, type II ([place])   . Headache(784.0)     has Hypertension; Depression; Pulmonary nodules; Lymphadenopathy, mediastinal; Diabetes mellitus type 2 in obese ([place]); Obesity, morbid ([place]); and 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 [facility]) on her problem list.     is allergic to asa.  Current Outpatient Prescriptions on File Prior to Visit  Medication Sig Dispense Refill  . aspirin-acetaminophen-caffeine (EXCEDRIN MIGRAINE) 250-250-65 MG per tablet Take 2 tablets by mouth every 6 (six) hours as needed for headache.    . Bevacizumab (AVASTIN IV) Inject into  the vein. To be given every 21 days. Started back on 05/15/15.    . capecitabine (XELODA) 500 MG tablet Take 3 tablets twice daily for 14 days on, then take a seven day break and restart 84 tablet 2  . cyclobenzaprine (FLEXERIL) 10 MG tablet Take 1 tablet (10 mg total) by mouth every 6 (six) hours as needed for muscle spasms. 60 tablet 0  . escitalopram (LEXAPRO) 20 MG tablet Take 1/2 tablet by mouth daily for 5 days then increase to one tablet daily 30 tablet 6  . granisetron  (KYTRIL) 1 MG tablet On days of taking Xeloda, take 1 tab 30 min prior to Xeloda in the am and pm. 56 tablet 3  . IRINOTECAN HCL IV Inject into the vein every 21 ( twenty-one) days.     Marland Kitchen lisinopril (PRINIVIL,ZESTRIL) 10 MG tablet Take 1 tablet (10 mg total) by mouth daily. 30 tablet 6  . Multiple Vitamin (MULTIVITAMIN) capsule Take 1 capsule by mouth daily.    Marland Kitchen oxyCODONE-acetaminophen (PERCOCET/ROXICET) 5-325 MG per tablet Take 1 tablet by mouth every 4 (four) hours as needed for severe pain. 60 tablet 0  . pantoprazole (PROTONIX) 40 MG tablet Take 1 tablet (40 mg total) by mouth daily. 30 tablet 3  . promethazine (PHENERGAN) 25 MG suppository Place 1 suppository (25 mg total) rectally every 6 (six) hours as needed for nausea or vomiting. 12 each 0  . rivaroxaban (XARELTO) 20 MG TABS tablet Take 1 tablet (20 mg total) by mouth daily with supper. 30 tablet 3  . Diphenhyd-Hydrocort-Nystatin (FIRST-DUKES MOUTHWASH) SUSP Use as directed 5 mLs in the mouth or throat 4 (four) times daily as needed. (Patient not taking: Reported on 05/15/2015) 300 mL 2  . diphenoxylate-atropine (LOMOTIL) 2.5-0.025 MG per tablet Take 1 tablet by mouth every 4 (four) hours as needed for diarrhea or loose stools. (Patient not taking: Reported on 06/12/2015) 45 tablet 0  . loperamide (IMODIUM) 2 MG capsule Take 2 caps after 1st loose stool then 1 cap every 2 hours until you go 12 hours without having a loose stool. At bedtime for diarrhea, take 2 caps then 2 caps every 4 hours until morning. (Patient not taking: Reported on 04/27/2015) 60 capsule 0  . magic mouthwash SOLN Swish and swallow 59m by mouth every 4 hours as needed for mouth pain. (Patient not taking: Reported on 05/15/2015) 360 mL 1  . metFORMIN (GLUCOPHAGE) 500 MG tablet Take 500 mg by mouth daily with breakfast.      Current Facility-Administered Medications on File Prior to Visit  Medication Dose Route Frequency Provider Last Rate Last Dose  . alteplase (CATHFLO  ACTIVASE) injection 2 mg  2 mg Intracatheter Once TBaird Cancer PA-C        Past Surgical History  Procedure Laterality Date  . Cholecystectomy    . Video bronchoscopy with endobronchial ultrasound N/A 02/06/2014    Procedure: VIDEO BRONCHOSCOPY WITH ENDOBRONCHIAL ULTRASOUND,bronchial biopsies , node sampling;  Surgeon: SMelrose Nakayama MD;  [facility]  Service: Thoracic;  Laterality: N/A;  . Tubal ligation      Denies any headaches, dizziness, double vision, fevers, chills, night sweats, nausea, vomiting, diarrhea, chest pain, heart palpitations, shortness of breath, blood in stool, black tarry stool, urinary pain, urinary burning, urinary frequency, hematuria.   PHYSICAL EXAMINATION  ECOG PERFORMANCE STATUS: 1 - Symptomatic but completely ambulatory  Filed Vitals:   06/12/15 0900  BP: 140/88  Pulse: 78  Temp: 98 F (36.7 C)  Resp: 18    GENERAL:alert, no distress, well nourished, well developed, comfortable, cooperative, obese and smiling, accompanied by her two children who played on their cell phones during the entire visit without any interaction with me. SKIN: skin color, texture, turgor are normal, no rashes or significant lesions HEAD: Normocephalic, No masses, lesions, tenderness or abnormalities EYES: normal, PERRLA, EOMI, Conjunctiva are pink and non-injected.   EARS: External ears normal OROPHARYNX:lips, buccal mucosa, and tongue normal and mucous membranes are moist  NECK: supple, no adenopathy, thyroid normal size, non-tender, without nodularity, no stridor, non-tender, trachea midline LYMPH:  no palpable lymphadenopathy BREAST:not examined LUNGS: clear to auscultation  HEART: regular rate & rhythm, no murmurs, no gallops, S1 normal and S2 normal ABDOMEN:abdomen soft, non-tender, obese and normal bowel sounds BACK: Back symmetric, no curvature., No CVA tenderness EXTREMITIES:less then 2 second capillary refill, no joint deformities, effusion, or  inflammation, no skin discoloration, no clubbing, no cyanosis  NEURO: alert & oriented x 3 with fluent speech, no focal motor/sensory deficits, gait normal    LABORATORY DATA: CBC    Component Value Date/Time   WBC 8.1 05/15/2015 0928   RBC 3.95 05/15/2015 0928   RBC 4.36 11/16/2014 1214   HGB 12.1 05/15/2015 0928   HCT 37.0 05/15/2015 0928   PLT 240 05/15/2015 0928   MCV 93.7 05/15/2015 0928   MCH 30.6 05/15/2015 0928   MCHC 32.7 05/15/2015 0928   RDW 14.2 05/15/2015 0928   LYMPHSABS 2.8 05/15/2015 0928   MONOABS 0.6 05/15/2015 0928   EOSABS 0.2 05/15/2015 0928   BASOSABS 0.0 05/15/2015 0928      Chemistry      Component Value Date/Time   NA 138 05/15/2015 0928   K 3.6 05/15/2015 0928   CL 104 05/15/2015 0928   CO2 26 05/15/2015 0928   BUN 12 05/15/2015 0928   CREATININE 0.85 05/15/2015 0928      Component Value Date/Time   CALCIUM 9.4 05/15/2015 0928   ALKPHOS 84 05/15/2015 0928   AST 27 05/15/2015 0928   ALT 29 05/15/2015 0928   BILITOT 0.5 05/15/2015 0928     Lab Results  Component Value Date   CEA 286.0* 05/15/2015    PENDING LABS:   RADIOGRAPHIC STUDIES:  No results found.   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  Xeloda dosing was reduced to 1500 mg BID, days 1-14 every 21 days beginning on 05/15/2015 (did not tolerate 2000 mg BID Days 1-14 ecery 21 days with stomatitis).  Oncology history is up-to-date.  She knows to hold Xeloda and call the clinic with any Xeloda-induced complications.  She will use anti-emetics at home for nausea/vomiting.  She notes that phenergan suppositories are most effective for her.  She will call us with any issues.    Return in 21 days for follow-up.    THERAPY PLAN:  Continue with treatment as outlined above.  All questions were answered. The patient knows to call the clinic with  any problems, questions or concerns. We can certainly see the patient much sooner if necessary.  Patient and plan discussed with Dr. Ancil Linsey and she is in agreement with the aforementioned.   This note is electronically signed by: Robynn Pane, PA-C 06/12/2015 10:00 AM

## 2015-06-10 NOTE — Assessment & Plan Note (Addendum)
Stage IV Colon Cancer, beginning XELIRI on 04/24/2015 with progression of diease on CT imaging.  Xeloda dosing was reduced to 1500 mg BID, days 1-14 every 21 days beginning on 05/15/2015 (did not tolerate 2000 mg BID Days 1-14 ecery 21 days with stomatitis).  Oncology history is up-to-date.  She knows to hold Xeloda and call the clinic with any Xeloda-induced complications.  She will use anti-emetics at home for nausea/vomiting.  She notes that phenergan suppositories are most effective for her.  She will call us with any issues.    Return in 21 days for follow-up.

## 2015-06-12 ENCOUNTER — Encounter: Payer: Self-pay | Admitting: *Deleted

## 2015-06-12 ENCOUNTER — Encounter (HOSPITAL_BASED_OUTPATIENT_CLINIC_OR_DEPARTMENT_OTHER): Admitting: Oncology

## 2015-06-12 ENCOUNTER — Encounter (HOSPITAL_COMMUNITY): Payer: Self-pay | Admitting: Oncology

## 2015-06-12 ENCOUNTER — Encounter (HOSPITAL_BASED_OUTPATIENT_CLINIC_OR_DEPARTMENT_OTHER)

## 2015-06-12 VITALS — BP 153/97 | HR 70 | Temp 98.3°F | Resp 18

## 2015-06-12 VITALS — BP 140/88 | HR 78 | Temp 98.0°F | Resp 18 | Wt 253.3 lb

## 2015-06-12 DIAGNOSIS — Z452 Encounter for adjustment and management of vascular access device: Secondary | ICD-10-CM | POA: Diagnosis not present

## 2015-06-12 DIAGNOSIS — C189 Malignant neoplasm of colon, unspecified: Secondary | ICD-10-CM | POA: Diagnosis not present

## 2015-06-12 DIAGNOSIS — C187 Malignant neoplasm of sigmoid colon: Secondary | ICD-10-CM

## 2015-06-12 DIAGNOSIS — C7800 Secondary malignant neoplasm of unspecified lung: Secondary | ICD-10-CM

## 2015-06-12 DIAGNOSIS — Z5112 Encounter for antineoplastic immunotherapy: Secondary | ICD-10-CM

## 2015-06-12 DIAGNOSIS — Z95828 Presence of other vascular implants and grafts: Secondary | ICD-10-CM

## 2015-06-12 DIAGNOSIS — Z5111 Encounter for antineoplastic chemotherapy: Secondary | ICD-10-CM | POA: Diagnosis not present

## 2015-06-12 DIAGNOSIS — R112 Nausea with vomiting, unspecified: Secondary | ICD-10-CM | POA: Diagnosis not present

## 2015-06-12 LAB — COMPREHENSIVE METABOLIC PANEL
ALBUMIN: 3.5 g/dL (ref 3.5–5.0)
ALK PHOS: 81 U/L (ref 38–126)
ALT: 19 U/L (ref 14–54)
ANION GAP: 9 (ref 5–15)
AST: 19 U/L (ref 15–41)
BILIRUBIN TOTAL: 0.4 mg/dL (ref 0.3–1.2)
BUN: 9 mg/dL (ref 6–20)
CO2: 23 mmol/L (ref 22–32)
Calcium: 9.2 mg/dL (ref 8.9–10.3)
Chloride: 107 mmol/L (ref 101–111)
Creatinine, Ser: 0.67 mg/dL (ref 0.44–1.00)
GFR calc Af Amer: >60 mL/min (ref >60)
GFR calc non Af Amer: >60 mL/min (ref >60)
GLUCOSE: 177 mg/dL — AB (ref 65–99)
POTASSIUM: 4 mmol/L (ref 3.5–5.1)
SODIUM: 139 mmol/L (ref 135–145)
TOTAL PROTEIN: 7.3 g/dL (ref 6.5–8.1)

## 2015-06-12 LAB — URINALYSIS, DIPSTICK ONLY
BILIRUBIN URINE: NEGATIVE
Glucose, UA: 250 mg/dL — AB
KETONES UR: NEGATIVE mg/dL
LEUKOCYTES UA: NEGATIVE
NITRITE: NEGATIVE
PROTEIN: NEGATIVE mg/dL
Specific Gravity, Urine: 1.025 (ref 1.005–1.030)
pH: 6 (ref 5.0–8.0)

## 2015-06-12 LAB — CBC WITH DIFFERENTIAL/PLATELET
BASOS PCT: 0 %
Basophils Absolute: 0 10*3/uL (ref 0.0–0.1)
EOS ABS: 0.2 10*3/uL (ref 0.0–0.7)
Eosinophils Relative: 2 %
HEMATOCRIT: 37.6 % (ref 36.0–46.0)
HEMOGLOBIN: 12.4 g/dL (ref 12.0–15.0)
Lymphocytes Relative: 35 %
Lymphs Abs: 3.2 10*3/uL (ref 0.7–4.0)
MCH: 30.5 pg (ref 26.0–34.0)
MCHC: 33 g/dL (ref 30.0–36.0)
MCV: 92.4 fL (ref 78.0–100.0)
Monocytes Absolute: 0.8 10*3/uL (ref 0.1–1.0)
Monocytes Relative: 8 %
Neutro Abs: 5 10*3/uL (ref 1.7–7.7)
Neutrophils Relative %: 55 %
Platelets: 243 10*3/uL (ref 150–400)
RBC: 4.07 MIL/uL (ref 3.87–5.11)
RDW: 14.5 % (ref 11.5–15.5)
WBC: 9.1 10*3/uL (ref 4.0–10.5)

## 2015-06-12 MED ORDER — ATROPINE SULFATE 1 MG/ML IJ SOLN
0.5000 mg | Freq: Once | INTRAMUSCULAR | Status: AC | PRN
  Administered 2015-06-12: 0.5 mg via INTRAVENOUS
  Filled 2015-06-12: qty 1

## 2015-06-12 MED ORDER — HEPARIN SOD (PORK) LOCK FLUSH 100 UNIT/ML IV SOLN
INTRAVENOUS | Status: AC
  Filled 2015-06-12: qty 5

## 2015-06-12 MED ORDER — SODIUM CHLORIDE 0.9 % IJ SOLN
10.0000 mL | INTRAMUSCULAR | Status: DC | PRN
Start: 2015-06-12 — End: 2015-06-12
  Administered 2015-06-12: 10 mL
  Filled 2015-06-12: qty 10

## 2015-06-12 MED ORDER — STERILE WATER FOR INJECTION IJ SOLN
INTRAMUSCULAR | Status: AC
  Filled 2015-06-12: qty 10

## 2015-06-12 MED ORDER — PALONOSETRON HCL INJECTION 0.25 MG/5ML
0.2500 mg | Freq: Once | INTRAVENOUS | Status: AC
  Administered 2015-06-12: 0.25 mg via INTRAVENOUS
  Filled 2015-06-12: qty 5

## 2015-06-12 MED ORDER — ALTEPLASE 2 MG IJ SOLR
INTRAMUSCULAR | Status: AC
  Filled 2015-06-12: qty 2

## 2015-06-12 MED ORDER — ALTEPLASE 2 MG IJ SOLR
2.0000 mg | Freq: Once | INTRAMUSCULAR | Status: AC
  Administered 2015-06-12: 2 mg

## 2015-06-12 MED ORDER — FOSAPREPITANT DIMEGLUMINE INJECTION 150 MG
Freq: Once | INTRAVENOUS | Status: AC
  Administered 2015-06-12: 11:00:00 via INTRAVENOUS
  Filled 2015-06-12: qty 5

## 2015-06-12 MED ORDER — SODIUM CHLORIDE 0.9 % IV SOLN
Freq: Once | INTRAVENOUS | Status: AC
  Administered 2015-06-12: 11:00:00 via INTRAVENOUS

## 2015-06-12 MED ORDER — HEPARIN SOD (PORK) LOCK FLUSH 100 UNIT/ML IV SOLN
500.0000 [IU] | Freq: Once | INTRAVENOUS | Status: AC | PRN
  Administered 2015-06-12: 500 [IU]

## 2015-06-12 MED ORDER — SODIUM CHLORIDE 0.9 % IV SOLN
7.5000 mg/kg | Freq: Once | INTRAVENOUS | Status: AC
  Administered 2015-06-12: 850 mg via INTRAVENOUS
  Filled 2015-06-12: qty 28

## 2015-06-12 MED ORDER — IRINOTECAN HCL CHEMO INJECTION 100 MG/5ML
200.0000 mg/m2 | Freq: Once | INTRAVENOUS | Status: AC
  Administered 2015-06-12: 326 mg via INTRAVENOUS
  Filled 2015-06-12: qty 5.43

## 2015-06-12 NOTE — Patient Instructions (Addendum)
[place] at [facility] Discharge Instructions  RECOMMENDATIONS MADE BY THE CONSULTANT AND ANY TEST RESULTS WILL BE SENT TO YOUR REFERRING PHYSICIAN.  Exam and discussion by Robynn Pane, PA-C If blood counts are good, you will be treated today. Report fevers, uncontrolled nausea, vomiting, diarrhea or other concerns.  Follow-up in 21 days with chemotherapy and office visit.  Thank you for choosing [place] at [facility] to provide your oncology and hematology care.  To afford each patient quality time with our provider, please arrive at least 15 minutes before your scheduled appointment time.    You need to re-schedule your appointment should you arrive 10 or more minutes late.  We strive to give you quality time with our providers, and arriving late affects you and other patients whose appointments are after yours.  Also, if you no show three or more times for appointments you may be dismissed from the clinic at the providers discretion.     Again, thank you for choosing [facility].  Our hope is that these requests will decrease the amount of time that you wait before being seen by our physicians.       _____________________________________________________________  Should you have questions after your visit to [facility], please contact our office at [phone] between the hours of 8:30 a.m. and 4:30 p.m.  Voicemails left after 4:30 p.m. will not be returned until the following business day.  For prescription refill requests, have your pharmacy contact our office.

## 2015-06-12 NOTE — Progress Notes (Signed)
[place] Clinical Social Work  Clinical Social Work was referred by [place] rounding for re-assessment of psychosocial needs due to need for emotional support and interest in support programs. Clinical Social Worker shared with pt upcoming support and healing arts programs. Pt eager to attend and often attends groups in [place]. Pt shared she was surprised there were not more options for pts here for additional support. CSW discussed all the options available, Duanne [place] and that she could attend groups in [place] as well. Pt stated understanding and plans to register for healing arts programs. No other needs shared today. Pt agrees to [place] following.   Clinical Social Work interventions: Supportive listening Support program education   Loren Racer, [place] Tuesdays   [phone]

## 2015-06-12 NOTE — Progress Notes (Signed)
0930 accessed port. No blood return after multiple saline flushes and change in positions. No complaints voiced related to saline flush. Alteplase instilled per protocol at 940 am. 1100 am removed alteplase and 10 cc blood easily from port. Flushed easily and was able to withdraw blood easily again.  Tolerated chemo well. Ambulatory on discharge home with family.

## 2015-06-12 NOTE — Patient Instructions (Signed)
[facility] Discharge Instructions for Patients Receiving Chemotherapy  Today you received the following chemotherapy agents Irinotecan and Avastin.  To help prevent nausea and vomiting after your treatment, we encourage you to take your nausea medication as instructed.  If you develop nausea and vomiting that is not controlled by your nausea medication, call the clinic. If it is after clinic hours your family physician or the after hours number for the clinic or go to the Emergency Department. BELOW ARE SYMPTOMS THAT SHOULD BE REPORTED IMMEDIATELY:  *FEVER GREATER THAN 101.0 F  *CHILLS WITH OR WITHOUT FEVER  NAUSEA AND VOMITING THAT IS NOT CONTROLLED WITH YOUR NAUSEA MEDICATION  *UNUSUAL SHORTNESS OF BREATH  *UNUSUAL BRUISING OR BLEEDING  TENDERNESS IN MOUTH AND THROAT WITH OR WITHOUT PRESENCE OF ULCERS  *URINARY PROBLEMS  *BOWEL PROBLEMS  UNUSUAL RASH Items with * indicate a potential emergency and should be followed up as soon as possible.  Return as scheduled.  I have been informed and understand all the instructions given to me. I know to contact the clinic, my physician, or go to the Emergency Department if any problems should occur. I do not have any questions at this time, but understand that I may call the clinic during office hours or the Patient Navigator at [phone] should I have any questions or need assistance in obtaining follow up care.    __________________________________________  _____________  __________ Signature of Patient or Authorized Representative            Date                   Time    __________________________________________ Nurse's Signature

## 2015-06-13 ENCOUNTER — Telehealth (HOSPITAL_COMMUNITY): Payer: Self-pay | Admitting: *Deleted

## 2015-06-13 LAB — CEA: CEA: 245.5 ng/mL — AB (ref 0.0–4.7)

## 2015-06-13 NOTE — Telephone Encounter (Signed)
Patient let me know today that she experienced a sour stomach since getting home yesterday from chemo. She has to vomit up the sour stuff on her stomach. Vomit is orange in color when she does vomit. Vomited yday and today. Patient feels nauseated. She is trying to push fluids. BMs are occurring. Regular BMs reported as normal. Patient has taken a bubble bath and is resting now. She is taking her phenergan suppository for her nausea. She states that that works the best.

## 2015-06-14 ENCOUNTER — Telehealth (HOSPITAL_COMMUNITY): Payer: Self-pay | Admitting: *Deleted

## 2015-06-14 NOTE — Telephone Encounter (Signed)
I'm torn about whether this is chemotherapy nausea/vomiting or GERD induced due to "sour stomach."  I think the easiest and quickest way to address this is ask her to double her Protonix for the this week and through the weekend.  If this is not effective, have her call tomorrow and I will try another anti-emetic.  TK

## 2015-06-14 NOTE — Telephone Encounter (Signed)
Patient has not been nauseated today. She says she feels better. No Phenergan since last night. No sour stomach or vomiting today. She ate oatmeal this morning and that is all she has eaten so far. She said she would try to eat something in a little bit. She said she is drinking plenty of fluids. Pt given recommendations by Gershon Mussel which was to increase Protonix '40mg'$  to BID through the weekend and call Hildred Alamin on Monday with a progress report. She said ok.

## 2015-06-26 ENCOUNTER — Inpatient Hospital Stay (HOSPITAL_COMMUNITY): Payer: Medicaid Other

## 2015-07-03 ENCOUNTER — Inpatient Hospital Stay (HOSPITAL_COMMUNITY): Payer: Medicaid Other

## 2015-07-03 ENCOUNTER — Ambulatory Visit (HOSPITAL_COMMUNITY): Payer: Medicaid Other | Admitting: Hematology & Oncology

## 2015-07-17 ENCOUNTER — Encounter (HOSPITAL_COMMUNITY): Attending: Oncology | Admitting: Hematology & Oncology

## 2015-07-17 ENCOUNTER — Encounter (HOSPITAL_COMMUNITY)

## 2015-07-17 ENCOUNTER — Encounter (HOSPITAL_COMMUNITY): Payer: Self-pay | Admitting: Hematology & Oncology

## 2015-07-17 VITALS — BP 165/91 | HR 71 | Temp 98.0°F | Resp 20 | Wt 254.0 lb

## 2015-07-17 DIAGNOSIS — C189 Malignant neoplasm of colon, unspecified: Secondary | ICD-10-CM

## 2015-07-17 DIAGNOSIS — I82621 Acute embolism and thrombosis of deep veins of right upper extremity: Secondary | ICD-10-CM

## 2015-07-17 DIAGNOSIS — C7800 Secondary malignant neoplasm of unspecified lung: Principal | ICD-10-CM

## 2015-07-17 DIAGNOSIS — R918 Other nonspecific abnormal finding of lung field: Secondary | ICD-10-CM

## 2015-07-17 DIAGNOSIS — C787 Secondary malignant neoplasm of liver and intrahepatic bile duct: Secondary | ICD-10-CM

## 2015-07-17 DIAGNOSIS — Z5112 Encounter for antineoplastic immunotherapy: Secondary | ICD-10-CM | POA: Diagnosis not present

## 2015-07-17 DIAGNOSIS — Z87891 Personal history of nicotine dependence: Secondary | ICD-10-CM

## 2015-07-17 DIAGNOSIS — C19 Malignant neoplasm of rectosigmoid junction: Secondary | ICD-10-CM | POA: Diagnosis not present

## 2015-07-17 DIAGNOSIS — R51 Headache: Secondary | ICD-10-CM

## 2015-07-17 DIAGNOSIS — Z5111 Encounter for antineoplastic chemotherapy: Secondary | ICD-10-CM

## 2015-07-17 LAB — COMPREHENSIVE METABOLIC PANEL
ALT: 25 U/L (ref 14–54)
AST: 22 U/L (ref 15–41)
Albumin: 3.6 g/dL (ref 3.5–5.0)
Alkaline Phosphatase: 95 U/L (ref 38–126)
Anion gap: 10 (ref 5–15)
BUN: 9 mg/dL (ref 6–20)
CHLORIDE: 104 mmol/L (ref 101–111)
CO2: 25 mmol/L (ref 22–32)
CREATININE: 0.67 mg/dL (ref 0.44–1.00)
Calcium: 9.5 mg/dL (ref 8.9–10.3)
GFR calc Af Amer: >60 mL/min (ref >60)
GFR calc non Af Amer: >60 mL/min (ref >60)
Glucose, Bld: 197 mg/dL — ABNORMAL HIGH (ref 65–99)
Potassium: 3.9 mmol/L (ref 3.5–5.1)
SODIUM: 139 mmol/L (ref 135–145)
Total Bilirubin: 0.5 mg/dL (ref 0.3–1.2)
Total Protein: 7.7 g/dL (ref 6.5–8.1)

## 2015-07-17 LAB — URINALYSIS, DIPSTICK ONLY
BILIRUBIN URINE: NEGATIVE
Glucose, UA: 250 mg/dL — AB
Ketones, ur: NEGATIVE mg/dL
Leukocytes, UA: NEGATIVE
NITRITE: NEGATIVE
PH: 5.5 (ref 5.0–8.0)

## 2015-07-17 LAB — CBC WITH DIFFERENTIAL/PLATELET
Basophils Absolute: 0 10*3/uL (ref 0.0–0.1)
Basophils Relative: 0 %
EOS ABS: 0.3 10*3/uL (ref 0.0–0.7)
Eosinophils Relative: 2 %
HCT: 41.6 % (ref 36.0–46.0)
HEMOGLOBIN: 13.9 g/dL (ref 12.0–15.0)
LYMPHS ABS: 3.8 10*3/uL (ref 0.7–4.0)
LYMPHS PCT: 31 %
MCH: 30.7 pg (ref 26.0–34.0)
MCHC: 33.4 g/dL (ref 30.0–36.0)
MCV: 91.8 fL (ref 78.0–100.0)
Monocytes Absolute: 1 10*3/uL (ref 0.1–1.0)
Monocytes Relative: 8 %
NEUTROS PCT: 59 %
Neutro Abs: 7.1 10*3/uL (ref 1.7–7.7)
Platelets: 237 10*3/uL (ref 150–400)
RBC: 4.53 MIL/uL (ref 3.87–5.11)
RDW: 14.4 % (ref 11.5–15.5)
WBC: 12.3 10*3/uL — AB (ref 4.0–10.5)

## 2015-07-17 MED ORDER — RIVAROXABAN 20 MG PO TABS: 20.0000 mg | ORAL_TABLET | Freq: Every day | ORAL | Status: DC

## 2015-07-17 MED ORDER — HEPARIN SOD (PORK) LOCK FLUSH 100 UNIT/ML IV SOLN
INTRAVENOUS | Status: AC
  Filled 2015-07-17: qty 5

## 2015-07-17 MED ORDER — MAGIC MOUTHWASH: ORAL | Status: DC

## 2015-07-17 MED ORDER — SODIUM CHLORIDE 0.9 % IV SOLN
Freq: Once | INTRAVENOUS | Status: AC
  Administered 2015-07-17: 11:00:00 via INTRAVENOUS
  Filled 2015-07-17: qty 5

## 2015-07-17 MED ORDER — OXYCODONE-ACETAMINOPHEN 5-325 MG PO TABS: 1.0000 | ORAL_TABLET | ORAL | Status: DC | PRN

## 2015-07-17 MED ORDER — SODIUM CHLORIDE 0.9 % IV SOLN
Freq: Once | INTRAVENOUS | Status: AC
  Administered 2015-07-17: 10:00:00 via INTRAVENOUS

## 2015-07-17 MED ORDER — HEPARIN SOD (PORK) LOCK FLUSH 100 UNIT/ML IV SOLN
500.0000 [IU] | Freq: Once | INTRAVENOUS | Status: AC
  Administered 2015-07-17: 500 [IU] via INTRAVENOUS

## 2015-07-17 MED ORDER — PALONOSETRON HCL INJECTION 0.25 MG/5ML
0.2500 mg | Freq: Once | INTRAVENOUS | Status: AC
  Administered 2015-07-17: 0.25 mg via INTRAVENOUS
  Filled 2015-07-17: qty 5

## 2015-07-17 MED ORDER — METFORMIN HCL 500 MG PO TABS: 500.0000 mg | ORAL_TABLET | Freq: Every day | ORAL | Status: DC

## 2015-07-17 MED ORDER — DEXTROSE 5 % IV SOLN
200.0000 mg/m2 | Freq: Once | INTRAVENOUS | Status: AC
  Administered 2015-07-17: 326 mg via INTRAVENOUS
  Filled 2015-07-17: qty 5.43

## 2015-07-17 MED ORDER — SODIUM CHLORIDE 0.9 % IV SOLN
7.5000 mg/kg | Freq: Once | INTRAVENOUS | Status: AC
  Administered 2015-07-17: 850 mg via INTRAVENOUS
  Filled 2015-07-17: qty 34

## 2015-07-17 MED ORDER — ATROPINE SULFATE 1 MG/ML IJ SOLN: 0.5000 mg | Freq: Once | INTRAMUSCULAR | Status: DC | PRN

## 2015-07-17 MED ORDER — SODIUM CHLORIDE 0.9 % IJ SOLN: 10.0000 mL | INTRAMUSCULAR | Status: DC | PRN

## 2015-07-17 NOTE — Patient Instructions (Addendum)
[place] at [facility] Discharge Instructions  RECOMMENDATIONS MADE BY THE CONSULTANT AND ANY TEST RESULTS WILL BE SENT TO YOUR REFERRING PHYSICIAN.   Exam completed by Dr Whitney Muse today Hold Xeloda this week and start next week on 07/24/2015 If you spit up anymore blood please call us or if it is after hours go to ED  Keep your hands well moisturized  If your hands or feet get red or worse please let us know  Hopefully this will help with the mouth sores Pain medication refill Xarelto sent to [facility] mouth wash refilled Chemotherapy today  Return in 3 weeks for chemotherapy Try taking tums when you get that feeling in your chest to see if that helps if it doestn let us know We are going to get you set up for scans before you leave today  Return to see the doctor in 3 weeks  Please call the clinic if you have any questions or concerns   Thank you for choosing [place] at [facility] to provide your oncology and hematology care. To afford each patient quality time with our provider, please arrive at least 15 minutes before your scheduled appointment time.   You need to re-schedule your appointment should you arrive 10 or more minutes late. We strive to give you quality time with our providers, and arriving late affects you and other patients whose appointments are after yours. Also, if you no show three or more times for appointments you may be dismissed from the clinic at the providers discretion.   Again, thank you for choosing [facility]. Our hope is that these requests will decrease the amount of time that you wait before being seen by our physicians.  _____________________________________________________________  Should you have questions after your visit to [facility], please contact our office at [phone] between the hours of 8:30 a.m. and 4:30 p.m. Voicemails left after 4:30  p.m. will not be returned until the following business day. For prescription refill requests, have your pharmacy contact our office.

## 2015-07-17 NOTE — Progress Notes (Signed)
Discussed with Dr. Whitney Muse patient's nausea and spitting up blood with gagging/vomitting in tx area prior to treatment Orders are to proceed with irinotecan and avastin today. Hold xeloda this week and start on 07/24/2015 with 7 day on 7 day off regimen. Patient verbalized understanding . Marland KitchenVelora Barker reports slight nausea after today's treatment and was offered additional nausea meds, she prefers to use her suppository when she gets home. She is tearful at the end of treatment and when questioned she states 'sometimes it is just all to much and I get emotional".  Patient has been instructed to call us if nausea is not better tomorrow or if she has any further worsening of symptoms.

## 2015-07-17 NOTE — Progress Notes (Signed)
[facility] Progress Note  Patient Care Team: No Pcp Per Patient as PCP - General ([place]) Melrose Nakayama, MD as Consulting Physician (Cardiothoracic Surgery)  CHIEF COMPLAINTS/PURPOSE OF CONSULTATION:  Stage IV CRC CT of abdomen and pelvis on 02/20/2014 at 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 ([place])   02/20/2014 Imaging CT of abdomen and pelvis on 02/20/2014 at [facility] showing 3.6 cm apple core lesion in the proximal sigmoid colon 11 mm hypoenhancing lesion in the medial left hepatic dome, 4.1 cm right lower lobe mass and adjacent right lower lobe nodule   04/17/2014 Imaging Right upper extremity DVT 04/17/2014 paired brachial veins, axillary vein, and peripheral aspect of the right subclavian vein   07/06/2014 Imaging CT chest 07/06/2014 with bilateral pulmonary nodules and masses, RUL, lateral RUL, posterior LUL, lobulated nodule in the subpleural RLL   08/18/2014 Tumor Marker CEA 93.7 ng/ml   10/09/2014 Initial Diagnosis 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   02/19/2015 Imaging Progressive pulmonary metastatic disease as detailed above. Stable mediastinal lymphadenopathy. No abdominal/pelvic metastatic disease is identified   04/24/2015 -  Chemotherapy XELIRI started on 04/24/2015. Patient did not want to wear 5-FU pump.  On  2000 mg BID 14 days on and 7 days off.   04/28/2015 Adverse Reaction Mild stomatitis.  Xeloda held.  Treated with Magic Mouthwash.  Resolved by 05/02/2015 at office visit.   05/15/2015 Treatment Plan Change Will restart Xeloda at 1500 mg BID 14 days on and 7 days off (11/1)    HISTORY OF PRESENTING ILLNESS:   Erica Barker 55 y.o. female is here because of stage IV colon cancer. She is on therapy with XELIRI/AVASTIN.  She is overall doing very well. She was opposed to wearing the 5-FU pump.  Erica Barker is accompanied by her son today. She complains of mouth sores on her tongue, with two currently bothering her. The mouth sores began the day before yesterday. These mouth sores have affected her ability to eat. She has been off xeloda for a week. She has been taking three xeloda in the morning and at night. Denies diarrhea. The skin of her feet "has been getting scaly". Denies skin changes in her hands. She has some heart burn. She reports migraines every three to four days which begin in the back of her neck. Initially, she will lay down, relax, make sure she has eaten, and hope the migraine passes. She often has to take pain medication to manage these headaches. She has had CT imaging of the head in the past. No focal deficits or other neurologic complaints.  She is planning another cruise.  She notes that her QOL is excellent. Weight is stable.  She had a great holiday with family/friends.    MEDICAL HISTORY:  Past Medical History  Diagnosis Date  . Hypertension   . Depression   . Pulmonary nodules 01/30/14  CTA CHEST  . Lymphadenopathy, mediastinal 01/2014    CTA ANGIO .Marland KitchenEllsworth  . Morbid obesity ([place])   . Diabetes mellitus, type II ([place])   . Headache(784.0)     SURGICAL HISTORY: Past Surgical History  Procedure Laterality Date  . Cholecystectomy    . Video bronchoscopy with endobronchial ultrasound N/A 02/06/2014    Procedure: VIDEO BRONCHOSCOPY WITH ENDOBRONCHIAL  ULTRASOUND,bronchial biopsies , node sampling;  Surgeon: Melrose Nakayama, MD;  Location: [facility];  Service: Thoracic;  Laterality: N/A;  . Tubal ligation      SOCIAL HISTORY: Social History   Social History  . Marital Status: Married    Spouse Name: N/A  . Number of Children: N/A  . Years of Education: N/A   Occupational History  . Not on file.   Social History Main Topics  . Smoking status: Former Smoker -- 0.50 packs/day for 7 years    Types: Cigarettes    Quit date: 02/03/1989  . Smokeless tobacco: Never Used  . Alcohol Use: Yes     Comment: occasional  . Drug Use: No  . Sexual Activity: Not on file   Other Topics Concern  . Not on file   Social History Narrative  She is in a relationship. 3 children. Aged 32,35,20.  She has no grandchildren. She smoked in high school but quit after graduation. No ETOH. 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  She is originally from [place].  FAMILY HISTORY: Family History  Problem Relation Age of Onset  . Cancer Mother     UTERINE  . Cancer Father     LEUKEMIA  . Crohn's disease Son    indicated that her mother is alive. She indicated that her father is deceased. She indicated that her son is alive.   Father is deceased at age 71 from leukemia, mother is alive at 63 with a history of uterine cancer.  She has one sister how is healthy and one brother who is healthy. He works as a Engineer, structural.   ALLERGIES:  is allergic to asa.  MEDICATIONS:  Current Outpatient Prescriptions  Medication Sig Dispense Refill  . aspirin-acetaminophen-caffeine (EXCEDRIN MIGRAINE) 250-250-65 MG per tablet Take 2 tablets by mouth every 6 (six) hours as needed for headache.    . Bevacizumab (AVASTIN IV) Inject into the vein. To be given every 21 days. Started back on 05/15/15.    Marland Kitchen escitalopram (LEXAPRO) 20 MG tablet Take 1/2 tablet by mouth daily for 5 days then increase to one tablet daily 30 tablet 6  .  granisetron (KYTRIL) 1 MG tablet On days of taking Xeloda, take 1 tab 30 min prior to Xeloda in the am and pm. 56 tablet 3  . IRINOTECAN HCL IV Inject into the vein every 21 ( twenty-one) days.     Marland Kitchen lisinopril (PRINIVIL,ZESTRIL) 10 MG tablet Take 1 tablet (10 mg total) by mouth daily. 30 tablet 6  . magic mouthwash SOLN Swish and swallow 10m by mouth every 4 hours as needed for mouth pain. 360 mL 3  . Multiple Vitamin (MULTIVITAMIN) capsule Take 1 capsule by mouth daily.    .Marland KitchenoxyCODONE-acetaminophen (PERCOCET/ROXICET) 5-325 MG tablet Take 1 tablet by mouth every 4 (four) hours as needed for severe pain. 60 tablet 0  . pantoprazole (PROTONIX) 40 MG tablet Take 1 tablet (40 mg total) by mouth daily. 30 tablet 3  . promethazine (PHENERGAN) 25 MG suppository Place 1 suppository (  25 mg total) rectally every 6 (six) hours as needed for nausea or vomiting. 12 each 0  . rivaroxaban (XARELTO) 20 MG TABS tablet Take 1 tablet (20 mg total) by mouth daily with supper. 30 tablet 3  . capecitabine (XELODA) 500 MG tablet Take 3 tablets twice daily for 14 days on, then take a seven day break and restart 84 tablet 2  . Diphenhyd-Hydrocort-Nystatin (FIRST-DUKES MOUTHWASH) SUSP Use as directed 5 mLs in the mouth or throat 4 (four) times daily as needed. (Patient not taking: Reported on 07/17/2015) 300 mL 2  . diphenoxylate-atropine (LOMOTIL) 2.5-0.025 MG per tablet Take 1 tablet by mouth every 4 (four) hours as needed for diarrhea or loose stools. (Patient not taking: Reported on 06/12/2015) 45 tablet 0  . loperamide (IMODIUM) 2 MG capsule Take 2 caps after 1st loose stool then 1 cap every 2 hours until you go 12 hours without having a loose stool. At bedtime for diarrhea, take 2 caps then 2 caps every 4 hours until morning. (Patient not taking: Reported on 07/17/2015) 60 capsule 0   No current facility-administered medications for this visit.   Facility-Administered Medications Ordered in Other Visits  Medication Dose  Route Frequency Provider Last Rate Last Dose  . 0.9 %  sodium chloride infusion   Intravenous Once Patrici Ranks, MD      . sodium chloride 0.9 % injection 10 mL  10 mL Intracatheter PRN Patrici Ranks, MD        Review of Systems  Constitutional: Negative for fever, chills, weight loss and malaise/fatigue.  HENT: Positive for headaches and mouth sores. Negative for congestion, hearing loss, nosebleeds, sore throat and tinnitus.       Described as migraines that occur every 3-4 days, alleviated with pain medication.     Mouth sores began two days ago. Eyes: Negative for blurred vision, double vision, pain and discharge.  Respiratory: Negative for cough, hemoptysis, sputum production, shortness of breath and wheezing.   Cardiovascular: Negative for chest pain, palpitations, claudication, leg swelling and PND.  Gastrointestinal: Positive for heartburn. Negative for nausea, vomiting, abdominal pain, diarrhea, constipation, blood in stool and melena.  Genitourinary: Negative for dysuria, urgency, frequency and hematuria.  Musculoskeletal: Negative for myalgias, joint pain and falls.  Skin: Positive for skin changes. Negative for rash.      'Scaly' skin on feet. Neurological: Negative for dizziness, tingling, tremors, sensory change, speech change, focal weakness, seizures, loss of consciousness, weakness. Endo/Heme/Allergies: Does not bruise/bleed easily.  Psychiatric/Behavioral: Negative for depression, suicidal ideas, memory loss and substance abuse. The patient is not nervous/anxious and does not have insomnia.   14 point review of systems was performed and is negative except as detailed under history of present illness and above  PHYSICAL EXAMINATION: ECOG PERFORMANCE STATUS: 1 - Symptomatic but completely ambulatory  Filed Vitals:   07/17/15 0900  BP: 139/98  Pulse: 88  Temp: 97.8 F (36.6 C)  Resp: 16   Filed Weights   07/17/15 0900  Weight: 254 lb (115.214 kg)     Physical Exam  Constitutional: She is oriented to person, place, and time and well-developed, well-nourished, and in no distress.  HENT:  Head: Normocephalic and atraumatic.  Nose: Nose normal.  Mouth/Throat: Oropharynx is clear and moist. No oropharyngeal exudate.  Eyes: Conjunctivae and EOM are normal. Pupils are equal, round, and reactive to light. Right eye exhibits no discharge. Left eye exhibits no discharge. No scleral icterus.  Neck: Normal range of motion. Neck supple.  No tracheal deviation present. No thyromegaly present.  Cardiovascular: Normal rate, regular rhythm and normal heart sounds.  Exam reveals no gallop and no friction rub.  No murmur heard. Pulmonary/Chest: Effort normal and breath sounds normal. She has no wheezes. She has no rales.  Abdominal: Soft. Bowel sounds are normal. She exhibits no distension and no mass. There is no tenderness. There is no rebound and no guarding.  Musculoskeletal: Normal range of motion. She exhibits no edema.  Lymphadenopathy:    She has no cervical adenopathy.  Neurological: She is alert and oriented to person, place, and time. She has normal reflexes. No cranial nerve deficit. Gait normal. Coordination normal.  Skin: Skin is warm and dry. No rash noted.  Psychiatric: Mood, memory, affect and judgment normal.  Nursing note and vitals reviewed.   LABORATORY DATA:  I have reviewed the data as listed.  CBC    Component Value Date/Time   WBC 9.1 06/12/2015 0950   RBC 4.07 06/12/2015 0950   RBC 4.36 11/16/2014 1214   HGB 12.4 06/12/2015 0950   HCT 37.6 06/12/2015 0950   PLT 243 06/12/2015 0950   MCV 92.4 06/12/2015 0950   MCH 30.5 06/12/2015 0950   MCHC 33.0 06/12/2015 0950   RDW 14.5 06/12/2015 0950   LYMPHSABS 3.2 06/12/2015 0950   MONOABS 0.8 06/12/2015 0950   EOSABS 0.2 06/12/2015 0950   BASOSABS 0.0 06/12/2015 0950    CMP     Component Value Date/Time   NA 139 06/12/2015 0950   K 4.0 06/12/2015 0950   CL 107  06/12/2015 0950   CO2 23 06/12/2015 0950   GLUCOSE 177* 06/12/2015 0950   BUN 9 06/12/2015 0950   CREATININE 0.67 06/12/2015 0950   CALCIUM 9.2 06/12/2015 0950   PROT 7.3 06/12/2015 0950   ALBUMIN 3.5 06/12/2015 0950   AST 19 06/12/2015 0950   ALT 19 06/12/2015 0950   ALKPHOS 81 06/12/2015 0950   BILITOT 0.4 06/12/2015 0950   GFRNONAA >60 06/12/2015 0950   GFRAA >60 06/12/2015 0950   RADIOGRAPHIC STUDIES  Study Result     CLINICAL DATA: Subsequent encounter for colon cancer diagnoses 2015 with lung metastases.  EXAM: CT CHEST, ABDOMEN, AND PELVIS WITH CONTRAST  TECHNIQUE: Multidetector CT imaging of the chest, abdomen and pelvis was performed following the standard protocol during bolus administration of intravenous contrast.  CONTRAST: OMNIPAQUE IOHEXOL 300 MG/ML SOLN  COMPARISON: 02/19/2015  FINDINGS: CT CHEST FINDINGS  Mediastinum/Lymph Nodes: There is no axillary lymphadenopathy. 10 mm short axis precarinal lymph node measured previously is stable at 9 mm 10 mm. 18 mm short axis subcarinal lymph node on the previous study is now 19 mm. No evidence for hilar lymphadenopathy. The heart size is normal. No pericardial effusion. The esophagus has normal CT imaging features.  Lungs/Pleura: 16 x 14 mm right upper lobe index lesion on the previous study now measures 17 x 14 mm.  Index nodule in the left upper lobe measured previously at 10 x 10 mm now measures 11 x 14 mm.  A second index nodule in the right upper lobe was measured previously at 20 x 17 mm compared to 23 x 18 mm today.  Dominant lung lesion is in the right lower lobe and was measured previously at 3.9 x 4.2 cm which compares to 4.1 x 4.7 cm today.  Numerous other bilateral pulmonary nodules are evident. No focal airspace consolidation. No pulmonary edema or pleural effusion.  Musculoskeletal: Bone windows reveal no worrisome lytic  or sclerotic osseous lesions.  CT  ABDOMEN PELVIS FINDINGS  Hepatobiliary: 7 mm very subtle hypo attenuating focus is identified in the dome of the right liver (image 36 series 2). This is too small to definitively characterize. 9 mm hypo attenuating lesion is seen at the extreme inferior tip of the right liver and appears to be new in the interval. A third tiny hypo attenuating focus is seen in the right liver on image 53 of series 2, also apparently new in the interval. Gallbladder is surgically absent. No intrahepatic or extrahepatic biliary dilation.  Pancreas: No focal mass lesion. No dilatation of the main duct. No intraparenchymal cyst. No peripancreatic edema.  Spleen: No splenomegaly. No focal mass lesion.  Adrenals/Urinary Tract: No adrenal nodule or mass. Kidneys are normal in appearance bilaterally. No evidence for hydroureter. The urinary bladder appears normal for the degree of distention.  Stomach/Bowel: Stomach is nondistended. No gastric wall thickening. No evidence of outlet obstruction. Duodenum is normally positioned as is the ligament of Treitz. No small bowel wall thickening. No small bowel dilatation. The terminal ileum is normal. The appendix is normal. A few scattered diverticuli are seen in the colon. There is an area of wall thickening identified in the mid sigmoid colon (see image 87 series 2).  Vascular/Lymphatic: No abdominal aortic aneurysm. There is no gastrohepatic or hepatoduodenal ligament lymphadenopathy. No intraperitoneal or retroperitoneal lymphadenopy.  Multiple lymph nodes are seen in the sigmoid mesocolon, adjacent to the area of wall thickening. Imaging features are highly suspicious for metastatic nodal spread.  Reproductive: The uterus has normal CT imaging appearance. There is no adnexal mass.  Other: No intraperitoneal free fluid.  Musculoskeletal: Bone windows reveal no worrisome lytic or sclerotic osseous lesions.  IMPRESSION: 1. Mild interval  progression of bilateral pulmonary metastases. 2. No substantial interval change in mediastinal lymphadenopathy. 3. Interval development of tiny, very subtle low-density foci in the liver parenchyma. This raises concern for interval development of hepatic metastases. Liver MRI without and with contrast could be used to further assess, as clinically warranted. 4. Apparent wall thickening in the mid sigmoid colon is more prominent than on the previous study. Adjacent borderline enlarged lymph nodes in the sigmoid mesocolon are suspicious for metastatic disease.   Electronically Signed  By: Misty Stanley M.D.  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  At times the patient seems to be very accepting of her disease and prognosis and other times she is very difficult time addressing it. She currently has very good quality of life and is very active.   Thus far she is doing well on new therapy. Goal is to continue XELIRI/Avastin I have advised her it can be more toxic than FOLFIRI but she absolutely does not want to wear an infusion pump.  She has had problems in the past with XELODA but is currently doing well. We have changed her XELODA dosing to 7 on and 7 off. She has minor mouth soreness and reflux. No diarrhea or soreness of hands/feet.   I reminded her that if she develops redness, soreness of the hands and feet, or mouth sores, that she should just stop the pills, and call us for follow-up.  She will return in 3 weeks for follow-up.  I have scheduled repeat imaging for next month.  Refill [facility] mouthwash. Refill pain medication. Refill xarelto.  All questions were answered. The patient  knows to call the clinic with any problems, questions or concerns.    This document serves as a record of services personally performed by Ancil Linsey, MD. It was created on her behalf by Arlyce Harman, a trained medical scribe. The creation of this record is based on the scribe's personal observations and the provider's statements to them. This document has been checked and approved by the attending provider.  I have reviewed the above documentation for accuracy and completeness, and I agree with the above.  Kelby Fam. Whitney Muse, MD

## 2015-07-17 NOTE — Patient Instructions (Addendum)
[place] at [facility] Discharge Instructions  RECOMMENDATIONS MADE BY THE CONSULTANT AND ANY TEST RESULTS WILL BE SENT TO YOUR REFERRING PHYSICIAN.   Exam completed by Dr Whitney Muse today Take Xeloda 7 days on and 7 days off this will make the side effects less toxic Keep your hands moisturized good If your hands or feet get red or worse please let us know  Hopefully this will help with the mouth sores Pain medication refill Xarelto sent to [facility] mouth wash refilled Chemotherapy today if lab work is good Return in 3 weeks for chemotherapy Try taking tums when you get that feeling in your chest to see if that helps if it doestn let us know We are going to get you set up for scans before you leave today  Return to see the doctor in 3 weeks  Please call the clinic if you have any questions or concerns   Thank you for choosing [place] [facility] to provide your oncology and hematology care.  To afford each patient quality time with our provider, please arrive at least 15 minutes before your scheduled appointment time.    You need to re-schedule your appointment should you arrive 10 or more minutes late.  We strive to give you quality time with our providers, and arriving late affects you and other patients whose appointments are after yours.  Also, if you no show three or more times for appointments you may be dismissed from the clinic at the providers discretion.     Again, thank you for choosing [facility].  Our hope is that these requests will decrease the amount of time that you wait before being seen by our physicians.       _____________________________________________________________  Should you have questions after your visit to [facility], please contact our office at [phone] between the hours of 8:30 a.m. and 4:30 p.m.  Voicemails left after 4:30 p.m. will not be returned until the  following business day.  For prescription refill requests, have your pharmacy contact our office.

## 2015-07-18 ENCOUNTER — Telehealth (HOSPITAL_COMMUNITY): Payer: Self-pay | Admitting: Emergency Medicine

## 2015-07-18 LAB — CEA: CEA: 233.5 ng/mL — AB (ref 0.0–4.7)

## 2015-07-18 NOTE — Telephone Encounter (Signed)
-----   Message from [facility], PA-C sent at 07/18/2015  2:05 PM EST ----- CEA is declining.

## 2015-07-18 NOTE — Telephone Encounter (Signed)
Pt called and notified that CEA is declining

## 2015-07-19 NOTE — Assessment & Plan Note (Signed)
She is doing well. Will continue with current RX. See her back in 2 weeks.

## 2015-07-25 ENCOUNTER — Ambulatory Visit (HOSPITAL_COMMUNITY): Admission: RE | Admit: 2015-07-25 | Payer: Medicaid Other | Source: Ambulatory Visit

## 2015-07-26 ENCOUNTER — Other Ambulatory Visit (HOSPITAL_COMMUNITY): Payer: Self-pay | Admitting: Oncology

## 2015-07-26 DIAGNOSIS — C7800 Secondary malignant neoplasm of unspecified lung: Principal | ICD-10-CM

## 2015-07-26 DIAGNOSIS — R51 Headache: Secondary | ICD-10-CM

## 2015-07-26 DIAGNOSIS — R519 Headache, unspecified: Secondary | ICD-10-CM

## 2015-07-26 DIAGNOSIS — C189 Malignant neoplasm of colon, unspecified: Secondary | ICD-10-CM

## 2015-07-30 ENCOUNTER — Ambulatory Visit (HOSPITAL_COMMUNITY): Admission: RE | Admit: 2015-07-30 | Source: Ambulatory Visit

## 2015-08-05 NOTE — Progress Notes (Signed)
No PCP Per Patient No address on file  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 ([place])  Gastroesophageal reflux disease, esophagitis presence not specified - Plan: dexlansoprazole (DEXILANT) 60 MG capsule  CURRENT THERAPY: Xeliri beginning on 04/24/2015 (patient refusing 5FU pump).  Addition of Avastin for cycle 2 on 11/1.  Reduced Xeloda dose to 1500 mg BID, with further change in frequency to 7 days on and 7 days off (compared to days 1-14 every 21 days).  INTERVAL HISTORY: Erica Barker 55 y.o. female returns for followup of Stage IV colon cancer.    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 [facility])   02/20/2014 Imaging CT of abdomen and pelvis on 02/20/2014 at [facility] showing 3.6 cm apple core lesion in the proximal sigmoid colon 11 mm hypoenhancing lesion in the medial left hepatic dome, 4.1 cm right lower lobe mass and adjacent right lower lobe nodule   04/17/2014 Imaging Right upper extremity DVT 04/17/2014 paired brachial veins, axillary vein, and peripheral aspect of the right subclavian vein   07/06/2014 Imaging CT chest 07/06/2014 with bilateral pulmonary nodules and masses, RUL, lateral RUL, posterior LUL, lobulated nodule in the subpleural RLL   08/18/2014 Tumor Marker CEA 93.7 ng/ml   10/09/2014 Initial Diagnosis 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   10/11/2014 - 01/29/2015 Chemotherapy FOLFOX + Avastin beginning at [facility]   01/29/2015 Imaging Port study- Unremarkable Port-A-Cath injection.   02/19/2015 Imaging Progressive pulmonary metastatic disease as detailed above. Stable mediastinal lymphadenopathy. No abdominal/pelvic metastatic disease is identified   04/24/2015 -  Chemotherapy XELIRI started on 04/24/2015. Patient did not want to wear 5-FU pump.  On 2000 mg BID 14 days on and 7 days off.   04/28/2015 Adverse Reaction Mild stomatitis.  Xeloda held.  Treated with Magic Mouthwash.  Resolved by 05/02/2015 at office visit.   05/15/2015 Treatment Plan Change Will restart Xeloda  at 1500 mg BID 14 days on and 7 days off (11/1)   07/17/2015 Adverse Reaction Mouth sores   07/17/2015 Treatment Plan Change Change in Xeloda frequency: 1500 mg BID 7 days on and 7 days off.   07/24/2015 Adverse Reaction Increased nausea/vomiting   08/07/2015 Treatment Plan Change Irinotecan dose reduced by 10%    I personally reviewed and went over laboratory results with the patient.  The results are noted within this dictation.  Labs are being updated today for treatment.  She notes increased nausea with vomiting with her most recent cycle, lasting 8 days, compared to 3 days in the past.  She continues with a "sour stomach."  She reports that she is taking Protonix 80 mg daily at our request, without significant improvement in GERD symptoms.  Her mental/emoytional health remains intact and she continues with her great sense of humor and smiling.  She is wanting further therapy.  She notes some heat and coldness of her lower extremities.  Etiology unknown.  She reports that this has resolved.   Vitals are reviewed.  No weight loss noted.  Weight is stable at 252 lbs.     She admits that she did not call the clinic when she felt bad.  Home antiemetics are not effective with regards to PO meds.  She reports that suppository anti-emetics are most effective.  She is educated that she can call us and she can come in for symptom management, ie IV antiemetics and fluids. She was unaware of this.  She will call with problems moving forward.  Her mother has been telling her  about holistic cures for cancer.  She is not actively pursuing these, but she is educated that prior to starting any of these interventions, she is to consult with [place] to verify no interactions with chemotherapy.  At this time, she is informed by her mother about marijuana and some sort of tea.   Past Medical History  Diagnosis Date  . Hypertension   . Depression   . Pulmonary nodules 01/30/14    CTA CHEST  . Lymphadenopathy,  mediastinal 01/2014    CTA ANGIO [place]  . Morbid obesity ([place])   . Diabetes mellitus, type II ([place])   . Headache(784.0)     has Hypertension; Depression; Pulmonary nodules; Lymphadenopathy, mediastinal; Diabetes mellitus type 2 in obese ([place]); Obesity, morbid ([place]); and 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 [facility]) on her problem list.     is allergic to asa.  Current Outpatient Prescriptions on File Prior to Visit  Medication Sig Dispense Refill  . aspirin-acetaminophen-caffeine (EXCEDRIN MIGRAINE) 250-250-65 MG per tablet Take 2 tablets by mouth every 6 (six) hours as needed for headache.    . Bevacizumab (AVASTIN IV) Inject into the vein. To be given every 21 days. Started back on 05/15/15.    . capecitabine (XELODA) 500 MG tablet Take 3 tablets twice daily for 14 days on, then take a seven day break and restart (Patient taking differently: Take 3 tablets twice daily for 7 days on, then take a seven day break and restart) 84 tablet 2  . Diphenhyd-Hydrocort-Nystatin (FIRST-DUKES MOUTHWASH) SUSP Use as directed 5 mLs in the mouth or throat 4 (four) times daily as needed. 300 mL 2  . diphenoxylate-atropine (LOMOTIL) 2.5-0.025 MG per tablet Take 1 tablet by mouth every 4 (four) hours as needed for diarrhea or loose stools. 45 tablet 0  . escitalopram (LEXAPRO) 20 MG tablet Take 1/2 tablet by mouth daily for 5 days then increase to one tablet daily 30 tablet 6  . granisetron (KYTRIL) 1 MG tablet On days of taking Xeloda, take 1 tab 30 min prior to Xeloda in the am and pm. 56 tablet 3  . IRINOTECAN HCL IV Inject into the vein every 21 ( twenty-one) days.     Marland Kitchen lisinopril (PRINIVIL,ZESTRIL) 10 MG tablet Take 1 tablet (10 mg total) by mouth daily. 30 tablet 6  . loperamide (IMODIUM) 2 MG capsule Take 2 caps after 1st loose stool then 1 cap every 2 hours until you go 12 hours without having a loose stool. At bedtime for diarrhea, take 2 caps then 2 caps every 4 hours until morning. 60 capsule 0   . magic mouthwash SOLN Swish and swallow 31m by mouth every 4 hours as needed for mouth pain. 360 mL 3  . metFORMIN (GLUCOPHAGE) 500 MG tablet Take 1 tablet (500 mg total) by mouth daily with breakfast. 30 tablet 3  . Multiple Vitamin (MULTIVITAMIN) capsule Take 1 capsule by mouth daily.    .Marland KitchenoxyCODONE-acetaminophen (PERCOCET/ROXICET) 5-325 MG tablet Take 1 tablet by mouth every 4 (four) hours as needed for severe pain. 60 tablet 0  . pantoprazole (PROTONIX) 40 MG tablet Take 1 tablet (40 mg total) by mouth daily. 30 tablet 3  . promethazine (PHENERGAN) 25 MG suppository Place 1 suppository (25 mg total) rectally every 6 (six) hours as needed for nausea or vomiting. 12 each 0  . rivaroxaban (XARELTO) 20 MG TABS tablet Take 1 tablet (20 mg total) by mouth daily with supper. 30 tablet 3   No current facility-administered medications  on file prior to visit.    Past Surgical History  Procedure Laterality Date  . Cholecystectomy    . Video bronchoscopy with endobronchial ultrasound N/A 02/06/2014    Procedure: VIDEO BRONCHOSCOPY WITH ENDOBRONCHIAL ULTRASOUND,bronchial biopsies , node sampling;  Surgeon: Melrose Nakayama, MD;  Location: [place];  Service: Thoracic;  Laterality: N/A;  . Tubal ligation      Denies any headaches, dizziness, double vision, fevers, chills, night sweats, nausea, vomiting, diarrhea, chest pain, heart palpitations, shortness of breath, blood in stool, black tarry stool, urinary pain, urinary burning, urinary frequency, hematuria.   PHYSICAL EXAMINATION  ECOG PERFORMANCE STATUS: 1 - Symptomatic but completely ambulatory  Filed Vitals:   08/07/15 0925  BP: 140/92  Pulse: 76  Temp: 98.4 F (36.9 C)  Resp: 16    GENERAL:alert, no distress, well nourished, well developed, comfortable, cooperative, obese and smiling, accompanied by her son who napped during discussion and exam. SKIN: skin color, texture, turgor are normal, no rashes or significant lesions HEAD:  Normocephalic, No masses, lesions, tenderness or abnormalities EYES: normal, PERRLA, EOMI, Conjunctiva are pink and non-injected.   EARS: External ears normal OROPHARYNX:lips, buccal mucosa, and tongue normal and mucous membranes are moist  NECK: supple, no adenopathy, thyroid normal size, non-tender, without nodularity, no stridor, non-tender, trachea midline LYMPH:  no palpable lymphadenopathy BREAST:not examined LUNGS: clear to auscultation , no wheezes, rales or rhonchi. HEART: regular rate & rhythm, no murmurs, no gallops, S1 normal and S2 normal ABDOMEN:abdomen soft, non-tender, obese and normal bowel sounds BACK: Back symmetric, no curvature., No CVA tenderness EXTREMITIES:less then 2 second capillary refill, no joint deformities, effusion, or inflammation, no skin discoloration, no clubbing, no cyanosis  NEURO: alert & oriented x 3 with fluent speech, no focal motor/sensory deficits, gait normal    LABORATORY DATA: CBC    Component Value Date/Time   WBC 9.0 08/07/2015 0948   RBC 4.28 08/07/2015 0948   RBC 4.36 11/16/2014 1214   HGB 13.0 08/07/2015 0948   HCT 39.4 08/07/2015 0948   PLT 244 08/07/2015 0948   MCV 92.1 08/07/2015 0948   MCH 30.4 08/07/2015 0948   MCHC 33.0 08/07/2015 0948   RDW 14.8 08/07/2015 0948   LYMPHSABS 3.3 08/07/2015 0948   MONOABS 0.7 08/07/2015 0948   EOSABS 0.2 08/07/2015 0948   BASOSABS 0.0 08/07/2015 0948      Chemistry      Component Value Date/Time   NA 139 07/17/2015 0911   K 3.9 07/17/2015 0911   CL 104 07/17/2015 0911   CO2 25 07/17/2015 0911   BUN 9 07/17/2015 0911   CREATININE 0.67 07/17/2015 0911      Component Value Date/Time   CALCIUM 9.5 07/17/2015 0911   ALKPHOS 95 07/17/2015 0911   AST 22 07/17/2015 0911   ALT 25 07/17/2015 0911   BILITOT 0.5 07/17/2015 0911     Lab Results  Component Value Date   CEA 233.5* 07/17/2015    PENDING LABS:   RADIOGRAPHIC STUDIES:  No results found.   PATHOLOGY:      ASSESSMENT AND PLAN:  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 Stage IV Colon Cancer, beginning XELIRI on 04/24/2015 due to progression of diease on CT imaging.  Xeloda dosing was reduced to 1500 mg BID, days 1-14 every 21 days beginning on 05/15/2015 (did not tolerate 2000 mg BID Days 1-14 ecery 21 days with stomatitis) and further reduced on 07/17/2015 to 1500 mg PO BID 7 days on and 7 days off.  Avastin  added for cycle #2 on 05/15/2015.  Oncology history is updated.  She knows to hold Xeloda and call the clinic with any Xeloda-induced complications.  She is scheduled for restaging CT imaging on 08/30/2015.  She is scheduled for MRI of brain due to persistent headaches on 08/10/2015.  She reports increased nausea, vomiting, GERD with last treatment.  Previously, it lasted 3 days.  This cycle, it lasted for 8 days.  No weight loss identified on vitals today.  Relationship between GERD and N/V may be etiology.  This could also be chemotherapy induced.  She is using Protonix 80 mg daily with poor response.  I will provide an Rx for Dexilant 60 mg PO daily.  This medication is escribed.  Additionally, I will reduce her Irinotecan dose by 10% today.  She knows to call us in the interim with issues as I would be glad to provide her IV antiemetics if needed.  She notes some heat and coldness of her LE.  It has resolved.  Return in 21 days for follow-up.  THERAPY PLAN:  Continue with treatment as outlined above.  All questions were answered. The patient knows to call the clinic with any problems, questions or concerns. We can certainly see the patient much sooner if necessary.  Patient and plan discussed with Dr. Ancil Linsey and she is in agreement with the aforementioned.   This note is electronically signed by: Doy Mince 08/07/2015 10:22 AM

## 2015-08-05 NOTE — Assessment & Plan Note (Addendum)
Stage IV Colon Cancer, beginning XELIRI on 04/24/2015 due to progression of diease on CT imaging.  Xeloda dosing was reduced to 1500 mg BID, days 1-14 every 21 days beginning on 05/15/2015 (did not tolerate 2000 mg BID Days 1-14 ecery 21 days with stomatitis) and further reduced on 07/17/2015 to 1500 mg PO BID 7 days on and 7 days off.  Avastin added for cycle #2 on 05/15/2015.  Oncology history is updated.  She knows to hold Xeloda and call the clinic with any Xeloda-induced complications.  She is scheduled for restaging CT imaging on 08/30/2015.  She is scheduled for MRI of brain due to persistent headaches on 08/10/2015.  She reports increased nausea, vomiting, GERD with last treatment.  Previously, it lasted 3 days.  This cycle, it lasted for 8 days.  No weight loss identified on vitals today.  Relationship between GERD and N/V may be etiology.  This could also be chemotherapy induced.  She is using Protonix 80 mg daily with poor response.  I will provide an Rx for Dexilant 60 mg PO daily.  This medication is escribed.  Additionally, I will reduce her Irinotecan dose by 10% today.  She knows to call us in the interim with issues as I would be glad to provide her IV antiemetics if needed.  She notes some heat and coldness of her LE.  It has resolved.  Return in 21 days for follow-up.

## 2015-08-07 ENCOUNTER — Encounter (HOSPITAL_BASED_OUTPATIENT_CLINIC_OR_DEPARTMENT_OTHER)

## 2015-08-07 ENCOUNTER — Encounter (HOSPITAL_BASED_OUTPATIENT_CLINIC_OR_DEPARTMENT_OTHER): Admitting: Oncology

## 2015-08-07 ENCOUNTER — Encounter (HOSPITAL_COMMUNITY): Payer: Self-pay | Admitting: Oncology

## 2015-08-07 ENCOUNTER — Encounter: Payer: Self-pay | Admitting: *Deleted

## 2015-08-07 VITALS — BP 134/83 | HR 72 | Temp 97.7°F | Resp 20

## 2015-08-07 VITALS — BP 140/92 | HR 76 | Temp 98.4°F | Resp 16 | Wt 254.0 lb

## 2015-08-07 DIAGNOSIS — K219 Gastro-esophageal reflux disease without esophagitis: Secondary | ICD-10-CM

## 2015-08-07 DIAGNOSIS — Z5111 Encounter for antineoplastic chemotherapy: Secondary | ICD-10-CM | POA: Diagnosis not present

## 2015-08-07 DIAGNOSIS — Z5112 Encounter for antineoplastic immunotherapy: Secondary | ICD-10-CM

## 2015-08-07 DIAGNOSIS — C7800 Secondary malignant neoplasm of unspecified lung: Secondary | ICD-10-CM

## 2015-08-07 DIAGNOSIS — C189 Malignant neoplasm of colon, unspecified: Secondary | ICD-10-CM

## 2015-08-07 LAB — COMPREHENSIVE METABOLIC PANEL
ALBUMIN: 3.5 g/dL (ref 3.5–5.0)
ALK PHOS: 87 U/L (ref 38–126)
ALT: 23 U/L (ref 14–54)
AST: 22 U/L (ref 15–41)
Anion gap: 9 (ref 5–15)
BILIRUBIN TOTAL: 0.5 mg/dL (ref 0.3–1.2)
BUN: 10 mg/dL (ref 6–20)
CO2: 25 mmol/L (ref 22–32)
CREATININE: 0.65 mg/dL (ref 0.44–1.00)
Calcium: 9.3 mg/dL (ref 8.9–10.3)
Chloride: 103 mmol/L (ref 101–111)
GFR calc Af Amer: >60 mL/min (ref >60)
GLUCOSE: 229 mg/dL — AB (ref 65–99)
POTASSIUM: 3.6 mmol/L (ref 3.5–5.1)
Sodium: 137 mmol/L (ref 135–145)
TOTAL PROTEIN: 7.3 g/dL (ref 6.5–8.1)

## 2015-08-07 LAB — URINALYSIS, DIPSTICK ONLY
BILIRUBIN URINE: NEGATIVE
GLUCOSE, UA: 500 mg/dL — AB
Leukocytes, UA: NEGATIVE
Nitrite: NEGATIVE
PROTEIN: NEGATIVE mg/dL
Specific Gravity, Urine: 1.02 (ref 1.005–1.030)
pH: 5.5 (ref 5.0–8.0)

## 2015-08-07 LAB — CBC WITH DIFFERENTIAL/PLATELET
BASOS ABS: 0 10*3/uL (ref 0.0–0.1)
BASOS PCT: 0 %
Eosinophils Absolute: 0.2 10*3/uL (ref 0.0–0.7)
Eosinophils Relative: 2 %
HEMATOCRIT: 39.4 % (ref 36.0–46.0)
HEMOGLOBIN: 13 g/dL (ref 12.0–15.0)
LYMPHS PCT: 37 %
Lymphs Abs: 3.3 10*3/uL (ref 0.7–4.0)
MCH: 30.4 pg (ref 26.0–34.0)
MCHC: 33 g/dL (ref 30.0–36.0)
MCV: 92.1 fL (ref 78.0–100.0)
Monocytes Absolute: 0.7 10*3/uL (ref 0.1–1.0)
Monocytes Relative: 8 %
NEUTROS ABS: 4.8 10*3/uL (ref 1.7–7.7)
NEUTROS PCT: 53 %
Platelets: 244 10*3/uL (ref 150–400)
RBC: 4.28 MIL/uL (ref 3.87–5.11)
RDW: 14.8 % (ref 11.5–15.5)
WBC: 9 10*3/uL (ref 4.0–10.5)

## 2015-08-07 MED ORDER — PALONOSETRON HCL INJECTION 0.25 MG/5ML
INTRAVENOUS | Status: AC
  Filled 2015-08-07: qty 5

## 2015-08-07 MED ORDER — ATROPINE SULFATE 1 MG/ML IJ SOLN
0.5000 mg | Freq: Once | INTRAMUSCULAR | Status: AC | PRN
  Administered 2015-08-07: 12:00:00 via INTRAVENOUS

## 2015-08-07 MED ORDER — SODIUM CHLORIDE 0.9 % IV SOLN
Freq: Once | INTRAVENOUS | Status: AC
  Administered 2015-08-07: 12:00:00 via INTRAVENOUS

## 2015-08-07 MED ORDER — SODIUM CHLORIDE 0.9 % IJ SOLN: 10.0000 mL | INTRAMUSCULAR | Status: DC | PRN

## 2015-08-07 MED ORDER — SODIUM CHLORIDE 0.9 % IV SOLN
Freq: Once | INTRAVENOUS | Status: AC
  Administered 2015-08-07: 12:00:00 via INTRAVENOUS
  Filled 2015-08-07: qty 5

## 2015-08-07 MED ORDER — IRINOTECAN HCL CHEMO INJECTION 100 MG/5ML
180.0000 mg/m2 | Freq: Once | INTRAVENOUS | Status: AC
  Administered 2015-08-07: 294 mg via INTRAVENOUS
  Filled 2015-08-07: qty 4.9

## 2015-08-07 MED ORDER — DEXLANSOPRAZOLE 60 MG PO CPDR: 60.0000 mg | DELAYED_RELEASE_CAPSULE | Freq: Every day | ORAL | Status: DC

## 2015-08-07 MED ORDER — ATROPINE SULFATE 1 MG/ML IJ SOLN
INTRAMUSCULAR | Status: AC
  Filled 2015-08-07: qty 1

## 2015-08-07 MED ORDER — PALONOSETRON HCL INJECTION 0.25 MG/5ML
0.2500 mg | Freq: Once | INTRAVENOUS | Status: AC
  Administered 2015-08-07: 0.25 mg via INTRAVENOUS

## 2015-08-07 MED ORDER — HEPARIN SOD (PORK) LOCK FLUSH 100 UNIT/ML IV SOLN
INTRAVENOUS | Status: AC
  Filled 2015-08-07: qty 5

## 2015-08-07 MED ORDER — HEPARIN SOD (PORK) LOCK FLUSH 100 UNIT/ML IV SOLN
500.0000 [IU] | Freq: Once | INTRAVENOUS | Status: AC | PRN
  Administered 2015-08-07: 500 [IU]

## 2015-08-07 MED ORDER — SODIUM CHLORIDE 0.9 % IV SOLN
7.5000 mg/kg | Freq: Once | INTRAVENOUS | Status: AC
  Administered 2015-08-07: 850 mg via INTRAVENOUS
  Filled 2015-08-07: qty 28

## 2015-08-07 NOTE — Patient Instructions (Addendum)
[place] at [facility] Discharge Instructions  RECOMMENDATIONS MADE BY THE CONSULTANT AND ANY TEST RESULTS WILL BE SENT TO YOUR REFERRING PHYSICIAN.  Exam and discussion today with Kirby Crigler, PA. Reduced dose of irinotecan (chemo) to help reduce side effects. Dexilant prescription has been sent to your pharmacy - when you start this medicine, STOP taking Protonix. Call the clinic with any issues, questions, or concerns. Continue Xeloda 1500 mg twice a day (seven days on, seven days off). Office visit in three weeks with next treatment.   Thank you for choosing [place] at [facility] to provide your oncology and hematology care.  To afford each patient quality time with our provider, please arrive at least 15 minutes before your scheduled appointment time.   Beginning January 23rd 2017 lab work for the [name] will be done in the  Main lab at [facility] on 1st floor. If you have a lab appointment with the [facility] please come in thru the  Main Entrance and check in at the main information desk  You need to re-schedule your appointment should you arrive 10 or more minutes late.  We strive to give you quality time with our providers, and arriving late affects you and other patients whose appointments are after yours.  Also, if you no show three or more times for appointments you may be dismissed from the clinic at the providers discretion.     Again, thank you for choosing [facility].  Our hope is that these requests will decrease the amount of time that you wait before being seen by our physicians.       _____________________________________________________________  Should you have questions after your visit to [facility], please contact our office at [phone] between the hours of 8:30 a.m. and 4:30 p.m.  Voicemails left after 4:30 p.m. will not be returned until the following business day.  For  prescription refill requests, have your pharmacy contact our office.

## 2015-08-07 NOTE — Progress Notes (Signed)
Patient tolerated infusion well.  VSS.   

## 2015-08-07 NOTE — Progress Notes (Signed)
[place] Clinical Social Work  Clinical Social Work was referred by [place] rounding for assessment of psychosocial needs due to previous support needs. Clinical Social Worker met with patient at [facility] in the infusion room to offer support and assess for needs.  Pt has attended one [employer] group in December. She was very excited to see the article in the paper. Pt missed this month's Support Groups and pt was provided with new resource sheet today with various resource/support program information. Pt reports to be doing well and was in good spirits today. She is eager to attend next [employer]. She agrees to reach out to CSW as needed.    Clinical Social Work interventions: Supportive listening Resource update  Loren Racer, [place] Tuesdays   Phone:([phone] [place], [place] Tuesdays   [phone]

## 2015-08-07 NOTE — Patient Instructions (Signed)
[facility] Discharge Instructions for Patients Receiving Chemotherapy   Beginning January 23rd 2017 lab work for the [facility] will be done in the  Main lab at [facility] on 1st floor. If you have a lab appointment with the [place] please come in thru the  Main Entrance and check in at the main information desk   Today you received the following chemotherapy agents:  Avastin and Irinotecan.     If you develop nausea and vomiting, or diarrhea that is not controlled by your medication, call the clinic.  The clinic phone number is [phone]. Office hours are Monday-Friday 8:30am-5:00pm.  BELOW ARE SYMPTOMS THAT SHOULD BE REPORTED IMMEDIATELY:  *FEVER GREATER THAN 101.0 F  *CHILLS WITH OR WITHOUT FEVER  NAUSEA AND VOMITING THAT IS NOT CONTROLLED WITH YOUR NAUSEA MEDICATION  *UNUSUAL SHORTNESS OF BREATH  *UNUSUAL BRUISING OR BLEEDING  TENDERNESS IN MOUTH AND THROAT WITH OR WITHOUT PRESENCE OF ULCERS  *URINARY PROBLEMS  *BOWEL PROBLEMS  UNUSUAL RASH Items with * indicate a potential emergency and should be followed up as soon as possible. If you have an emergency after office hours please contact your primary care physician or go to the nearest emergency department.  Please call the clinic during office hours if you have any questions or concerns.   You may also contact the Patient Navigator at [phone] should you have any questions or need assistance in obtaining follow up care.

## 2015-08-08 LAB — CEA: CEA: 187.7 ng/mL — AB (ref 0.0–4.7)

## 2015-08-10 ENCOUNTER — Encounter (HOSPITAL_BASED_OUTPATIENT_CLINIC_OR_DEPARTMENT_OTHER)

## 2015-08-10 ENCOUNTER — Ambulatory Visit (HOSPITAL_COMMUNITY)
Admission: RE | Admit: 2015-08-10 | Discharge: 2015-08-10 | Disposition: A | Discharge status: Home / Self Care | Source: Ambulatory Visit | Attending: Oncology | Admitting: Oncology

## 2015-08-10 DIAGNOSIS — R51 Headache: Secondary | ICD-10-CM | POA: Diagnosis present

## 2015-08-10 DIAGNOSIS — C189 Malignant neoplasm of colon, unspecified: Secondary | ICD-10-CM

## 2015-08-10 DIAGNOSIS — R519 Headache, unspecified: Secondary | ICD-10-CM

## 2015-08-10 DIAGNOSIS — Z452 Encounter for adjustment and management of vascular access device: Secondary | ICD-10-CM

## 2015-08-10 DIAGNOSIS — C7800 Secondary malignant neoplasm of unspecified lung: Secondary | ICD-10-CM

## 2015-08-10 MED ORDER — GADOBENATE DIMEGLUMINE 529 MG/ML IV SOLN
20.0000 mL | Freq: Once | INTRAVENOUS | Status: AC | PRN
  Administered 2015-08-10: 20 mL via INTRAVENOUS

## 2015-08-10 NOTE — Progress Notes (Signed)
..  Erica Barker presented for Portacath access for xray procedure. porttacath located rt chest wall accessed with  H 20 power loc needle.  Good blood return present. Portacath flushed with 34m NS and  Patient sent to xray for procedure.  Procedure tolerated well and without incident.

## 2015-08-15 ENCOUNTER — Encounter (HOSPITAL_COMMUNITY): Payer: Self-pay | Admitting: Lab

## 2015-08-15 NOTE — Progress Notes (Signed)
Referral sent to [facility],  [place] faxed on 2/1.  They will contact patient with appt.

## 2015-08-28 ENCOUNTER — Inpatient Hospital Stay (HOSPITAL_COMMUNITY)

## 2015-08-28 ENCOUNTER — Ambulatory Visit (HOSPITAL_COMMUNITY): Admitting: Hematology & Oncology

## 2015-08-29 NOTE — Progress Notes (Signed)
This encounter was created in error - please disregard.

## 2015-08-30 ENCOUNTER — Ambulatory Visit (HOSPITAL_COMMUNITY)

## 2015-09-03 ENCOUNTER — Ambulatory Visit (HOSPITAL_COMMUNITY)
Admission: RE | Admit: 2015-09-03 | Discharge: 2015-09-03 | Disposition: A | Discharge status: Home / Self Care | Source: Ambulatory Visit | Attending: Hematology & Oncology | Admitting: Hematology & Oncology

## 2015-09-03 ENCOUNTER — Ambulatory Visit (HOSPITAL_COMMUNITY): Admission: RE | Admit: 2015-09-03 | Source: Ambulatory Visit

## 2015-09-03 DIAGNOSIS — R590 Localized enlarged lymph nodes: Secondary | ICD-10-CM | POA: Diagnosis not present

## 2015-09-03 DIAGNOSIS — C787 Secondary malignant neoplasm of liver and intrahepatic bile duct: Secondary | ICD-10-CM | POA: Insufficient documentation

## 2015-09-03 DIAGNOSIS — C7800 Secondary malignant neoplasm of unspecified lung: Secondary | ICD-10-CM

## 2015-09-03 DIAGNOSIS — C7801 Secondary malignant neoplasm of right lung: Secondary | ICD-10-CM | POA: Diagnosis not present

## 2015-09-03 DIAGNOSIS — C7802 Secondary malignant neoplasm of left lung: Secondary | ICD-10-CM | POA: Insufficient documentation

## 2015-09-03 DIAGNOSIS — R918 Other nonspecific abnormal finding of lung field: Secondary | ICD-10-CM

## 2015-09-03 DIAGNOSIS — C189 Malignant neoplasm of colon, unspecified: Secondary | ICD-10-CM | POA: Diagnosis present

## 2015-09-03 MED ORDER — HEPARIN SOD (PORK) LOCK FLUSH 100 UNIT/ML IV SOLN
500.0000 [IU] | Freq: Once | INTRAVENOUS | Status: AC
  Administered 2015-09-03: 500 [IU] via INTRAVENOUS

## 2015-09-03 MED ORDER — HEPARIN SOD (PORK) LOCK FLUSH 100 UNIT/ML IV SOLN
INTRAVENOUS | Status: AC
  Administered 2015-09-03: 500 [IU] via INTRAVENOUS
  Filled 2015-09-03: qty 5

## 2015-09-03 MED ORDER — IOHEXOL 300 MG/ML  SOLN
100.0000 mL | Freq: Once | INTRAMUSCULAR | Status: AC | PRN
  Administered 2015-09-03: 100 mL via INTRAVENOUS

## 2015-09-04 ENCOUNTER — Telehealth (HOSPITAL_COMMUNITY): Payer: Self-pay | Admitting: *Deleted

## 2015-09-04 ENCOUNTER — Other Ambulatory Visit (HOSPITAL_COMMUNITY): Payer: Self-pay | Admitting: *Deleted

## 2015-09-04 DIAGNOSIS — C189 Malignant neoplasm of colon, unspecified: Secondary | ICD-10-CM

## 2015-09-04 DIAGNOSIS — R918 Other nonspecific abnormal finding of lung field: Secondary | ICD-10-CM

## 2015-09-04 DIAGNOSIS — C7800 Secondary malignant neoplasm of unspecified lung: Principal | ICD-10-CM

## 2015-09-04 MED ORDER — OXYCODONE-ACETAMINOPHEN 5-325 MG PO TABS: 1.0000 | ORAL_TABLET | ORAL | Status: DC | PRN

## 2015-09-04 NOTE — Progress Notes (Signed)
Spoke with Alyse Low in Pathology and requested MSI by PCR/IHC on specimen [phone].

## 2015-09-04 NOTE — Telephone Encounter (Signed)
MSI testing by PCR/IHC requested on specimen [phone].

## 2015-09-06 ENCOUNTER — Encounter (HOSPITAL_COMMUNITY): Payer: Self-pay | Admitting: Oncology

## 2015-09-06 ENCOUNTER — Encounter (HOSPITAL_COMMUNITY): Attending: Oncology | Admitting: Oncology

## 2015-09-06 VITALS — BP 151/86 | Temp 99.0°F | Resp 20 | Wt 255.0 lb

## 2015-09-06 DIAGNOSIS — C7800 Secondary malignant neoplasm of unspecified lung: Secondary | ICD-10-CM | POA: Insufficient documentation

## 2015-09-06 DIAGNOSIS — C189 Malignant neoplasm of colon, unspecified: Secondary | ICD-10-CM | POA: Insufficient documentation

## 2015-09-06 DIAGNOSIS — C7801 Secondary malignant neoplasm of right lung: Secondary | ICD-10-CM | POA: Diagnosis not present

## 2015-09-06 DIAGNOSIS — Z9119 Patient's noncompliance with other medical treatment and regimen: Secondary | ICD-10-CM

## 2015-09-06 DIAGNOSIS — C7802 Secondary malignant neoplasm of left lung: Secondary | ICD-10-CM

## 2015-09-06 DIAGNOSIS — Z91199 Patient's noncompliance with other medical treatment and regimen due to unspecified reason: Secondary | ICD-10-CM

## 2015-09-06 HISTORY — DX: Patient's noncompliance with other medical treatment and regimen due to unspecified reason: Z91.199

## 2015-09-06 HISTORY — DX: Patient's noncompliance with other medical treatment and regimen: Z91.19

## 2015-09-06 NOTE — Patient Instructions (Addendum)
[employer] at [facility] Discharge Instructions  RECOMMENDATIONS MADE BY THE CONSULTANT AND ANY TEST RESULTS WILL BE SENT TO YOUR REFERRING PHYSICIAN.  Exam and discussion today with Kirby Crigler, PA. Pathology is performing MSI testing to see if you are a candidate for Keytruda. We will be in touch in the interim.  We hope to start treatment before you leave on your cruise.   Thank you for choosing [place] at [facility] to provide your oncology and hematology care.  To afford each patient quality time with our provider, please arrive at least 15 minutes before your scheduled appointment time.   Beginning January 23rd 2017 lab work for the [name] will be done in the  Main lab at [facility] on 1st floor. If you have a lab appointment with the [facility] please come in thru the  Main Entrance and check in at the main information desk  You need to re-schedule your appointment should you arrive 10 or more minutes late.  We strive to give you quality time with our providers, and arriving late affects you and other patients whose appointments are after yours.  Also, if you no show three or more times for appointments you may be dismissed from the clinic at the providers discretion.     Again, thank you for choosing [facility].  Our hope is that these requests will decrease the amount of time that you wait before being seen by our physicians.       _____________________________________________________________  Should you have questions after your visit to [facility], please contact our office at [phone] between the hours of 8:30 a.m. and 4:30 p.m.  Voicemails left after 4:30 p.m. will not be returned until the following business day.  For prescription refill requests, have your pharmacy contact our office.

## 2015-09-06 NOTE — Progress Notes (Addendum)
No PCP Per Patient No address on file  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 [facility])  Medically noncompliant  CURRENT THERAPY: XELIRI discontinued- progression of disease identified  INTERVAL HISTORY: Erica Barker 55 y.o. female returns for followup of Stage IV colon cancer, KRAS mutated, with imaging on 09/03/2015 showing progression of disease.    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 [facility])   02/20/2014 Imaging CT of abdomen and pelvis on 02/20/2014 at [facility] showing 3.6 cm apple core lesion in the proximal sigmoid colon 11 mm hypoenhancing lesion in the medial left hepatic dome, 4.1 cm right lower lobe mass and adjacent right lower lobe nodule   02/23/2014 Pathology Results KRAS MUTATED   04/17/2014 Imaging Right upper extremity DVT 04/17/2014 paired brachial veins, axillary vein, and peripheral aspect of the right subclavian vein   07/06/2014 Imaging CT chest 07/06/2014 with bilateral pulmonary nodules and masses, RUL, lateral RUL, posterior LUL, lobulated nodule in the subpleural RLL   08/18/2014 Tumor Marker CEA 93.7 ng/ml   10/09/2014 Initial Diagnosis 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   10/11/2014 - 01/29/2015 Chemotherapy FOLFOX + Avastin beginning at [facility]   01/29/2015 Imaging Port study- Unremarkable Port-A-Cath injection.   02/19/2015 Imaging Progressive pulmonary metastatic disease as detailed above. Stable mediastinal lymphadenopathy. No abdominal/pelvic metastatic disease is identified   04/24/2015 - 08/07/2015 Chemotherapy XELIRI started on 04/24/2015. Patient did not want to wear 5-FU pump.  On 2000 mg BID 14 days on and 7 days off.   04/28/2015 Adverse Reaction Mild stomatitis.  Xeloda held.  Treated with Magic Mouthwash.  Resolved by 05/02/2015 at office visit.   05/15/2015 Treatment Plan Change Will restart Xeloda at 1500 mg BID 14 days on and 7 days off (11/1)   07/17/2015 Adverse Reaction Mouth sores   07/17/2015 Treatment Plan Change Change in Xeloda frequency:  1500 mg BID 7 days on and 7 days off.   07/24/2015 Adverse Reaction Increased nausea/vomiting   08/07/2015 Treatment Plan Change Irinotecan dose reduced by 10%   08/10/2015 Imaging MRI brain- Scattered punctate subcortical T2 hyperintensities bilaterally are slightly greater than expected for age. The finding is nonspecific but can be seen in the setting of chronic microvascular ischemia, a demyelinating process such as multiple...   09/03/2015 Progression CT CAP- demonstrates new hepatic lesions   09/03/2015 Imaging CT CAP- Multiple new hepatic metastasis in the R hepatic lobe corresponds to subtle hypodensities identified on comparison exam.  Stable metastatic bilateral pulmonary nodules and masses. Small mesenteric lymph nodes along the sigmoid colon are unchanged   I personally reviewed and went over radiographic studies with the patient.  The results are noted within this dictation.  Her CT scan demonstrates multiple new hepatic metastases in the right hepatic lobe. Metastatic disease in lungs with stable along with mesenteric lymph nodes and mediastinal lymph nodes. I pulled up her CT scan images and we reviewed these in detail. I compared her images to her August 2016 CT scan.  I reviewed that we will discontinue chemotherapy at this time given her progression of disease.  We've contacted pathology requesting MSI testing on her malignancy. If high, she will be a candidate for Keytruda.  She is given minimal reading information regarding Keytruda. If her MSI testing is low and not a candidate for Keytruda, we will need to consider other alternative treatments such as regorafenib, Lonsurf, best supportive care, or clinical trial.  I did broach the topic of CODE STATUS. She did not make any decisions  regarding what she wishes, but I did provide her significant information regarding DO NOT RESUSCITATE, limited code, and full CODE STATUS. This was emotional topic for her today. I think it was well  explained. She would like some time to consider her options. She'll be given a MOST form. She will review this and either fill it out or consider her options. I hope to solidify her CODE STATUS moving forward.  She will be leaving on March 3 and returning on 09/22/2015 from [place]. She'll be leaving port from [facility] We will try to work around this schedule with regards to her next therapeutic intervention.  She asks about all natural on asked regarding treatment of cancer. She's been told about a particular Tea that she can drink. She knows that one of her coworkers was cured of colon cancer by using this tea. She may consume this Tea while she's not on therapy, when we've made therapeutic decisions, she is to bring this product to [place] and we can review the ingredients to verify that this will not interfere with any of her chemotherapy drugs.  She denies any complaints today. She notes that she feels well. She denies any nausea or vomiting. Her bowels are working appropriately.  Compliance is been a big issue with this patient. She canceled and rescheduled many appointments including lab appointments, follow-up appointments, and chemotherapy appointments. This is strongly discouraged. This will be reinforced based upon what her next treatment options turn out to be. She still has an excellent performance status and deserves some sort of intervention moving forward if she so desires.   Past Medical History  Diagnosis Date  . Hypertension   . Depression   . Pulmonary nodules 01/30/14    CTA CHEST  . Lymphadenopathy, mediastinal 01/2014    CTA ANGIO .Marland KitchenMarion  . Morbid obesity ([place])   . Diabetes mellitus, type II ([place])   . Headache(784.0)   . Medically noncompliant 09/06/2015    has Hypertension; Depression; Pulmonary nodules; Lymphadenopathy, mediastinal; Diabetes mellitus type 2 in obese ([place]); Obesity, morbid ([place]); 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 [facility]);  and Medically noncompliant on her problem list.     is allergic to asa.  Current Outpatient Prescriptions on File Prior to Visit  Medication Sig Dispense Refill  . aspirin-acetaminophen-caffeine (EXCEDRIN MIGRAINE) 250-250-65 MG per tablet Take 2 tablets by mouth every 6 (six) hours as needed for headache.    . Bevacizumab (AVASTIN IV) Inject into the vein. To be given every 21 days. Started back on 05/15/15.    . capecitabine (XELODA) 500 MG tablet Take 3 tablets twice daily for 14 days on, then take a seven day break and restart (Patient taking differently: Take 3 tablets twice daily for 7 days on, then take a seven day break and restart) 84 tablet 2  . dexlansoprazole (DEXILANT) 60 MG capsule Take 1 capsule (60 mg total) by mouth daily. 30 capsule 5  . Diphenhyd-Hydrocort-Nystatin (FIRST-DUKES MOUTHWASH) SUSP Use as directed 5 mLs in the mouth or throat 4 (four) times daily as needed. 300 mL 2  . diphenoxylate-atropine (LOMOTIL) 2.5-0.025 MG per tablet Take 1 tablet by mouth every 4 (four) hours as needed for diarrhea or loose stools. 45 tablet 0  . escitalopram (LEXAPRO) 20 MG tablet Take 1/2 tablet by mouth daily for 5 days then increase to one tablet daily 30 tablet 6  . granisetron (KYTRIL) 1 MG tablet On days of taking Xeloda, take 1 tab 30  min prior to Xeloda in the am and pm. 56 tablet 3  . IRINOTECAN HCL IV Inject into the vein every 21 ( twenty-one) days.     Marland Kitchen lisinopril (PRINIVIL,ZESTRIL) 10 MG tablet Take 1 tablet (10 mg total) by mouth daily. 30 tablet 6  . loperamide (IMODIUM) 2 MG capsule Take 2 caps after 1st loose stool then 1 cap every 2 hours until you go 12 hours without having a loose stool. At bedtime for diarrhea, take 2 caps then 2 caps every 4 hours until morning. 60 capsule 0  . magic mouthwash SOLN Swish and swallow 51m by mouth every 4 hours as needed for mouth pain. 360 mL 3  . metFORMIN (GLUCOPHAGE) 500 MG tablet Take 1 tablet (500 mg total) by mouth daily with  breakfast. 30 tablet 3  . Multiple Vitamin (MULTIVITAMIN) capsule Take 1 capsule by mouth daily.    .Marland KitchenoxyCODONE-acetaminophen (PERCOCET/ROXICET) 5-325 MG tablet Take 1 tablet by mouth every 4 (four) hours as needed for severe pain. 60 tablet 0  . pantoprazole (PROTONIX) 40 MG tablet Take 1 tablet (40 mg total) by mouth daily. 30 tablet 3  . promethazine (PHENERGAN) 25 MG suppository Place 1 suppository (25 mg total) rectally every 6 (six) hours as needed for nausea or vomiting. 12 each 0  . rivaroxaban (XARELTO) 20 MG TABS tablet Take 1 tablet (20 mg total) by mouth daily with supper. 30 tablet 3   No current facility-administered medications on file prior to visit.    Past Surgical History  Procedure Laterality Date  . Cholecystectomy    . Video bronchoscopy with endobronchial ultrasound N/A 02/06/2014    Procedure: VIDEO BRONCHOSCOPY WITH ENDOBRONCHIAL ULTRASOUND,bronchial biopsies , node sampling;  Surgeon: SMelrose Nakayama MD;  [facility]  Service: Thoracic;  Laterality: N/A;  . Tubal ligation      Denies any headaches, dizziness, double vision, fevers, chills, night sweats, nausea, vomiting, diarrhea, constipation, chest pain, heart palpitations, shortness of breath, blood in stool, black tarry stool, urinary pain, urinary burning, urinary frequency, hematuria.   PHYSICAL EXAMINATION  ECOG PERFORMANCE STATUS: 0 - Asymptomatic  Filed Vitals:   09/06/15 1401  BP: 151/86  Temp: 99 F (37.2 C)  Resp: 20    GENERAL:alert, no distress, well nourished, well developed, comfortable, cooperative, obese, smiling and tearful at times during discussion, and unaccompanied SKIN: skin color, texture, turgor are normal, no rashes or significant lesions HEAD: Normocephalic, No masses, lesions, tenderness or abnormalities EYES: normal, EOMI, Conjunctiva are pink and non-injected EARS: External ears normal OROPHARYNX:lips, buccal mucosa, and tongue normal and mucous membranes are  moist  NECK: supple, trachea midline LYMPH:  no palpable lymphadenopathy BREAST:not examined LUNGS: clear to auscultation  HEART: regular rate & rhythm ABDOMEN:abdomen soft, non-tender, obese and normal bowel sounds BACK: Back symmetric, no curvature., No CVA tenderness EXTREMITIES:less then 2 second capillary refill, no joint deformities, effusion, or inflammation, no skin discoloration, no cyanosis  NEURO: alert & oriented x 3 with fluent speech, no focal motor/sensory deficits, gait normal   LABORATORY DATA: CBC    Component Value Date/Time   WBC 9.0 08/07/2015 0948   RBC 4.28 08/07/2015 0948   RBC 4.36 11/16/2014 1214   HGB 13.0 08/07/2015 0948   HCT 39.4 08/07/2015 0948   PLT 244 08/07/2015 0948   MCV 92.1 08/07/2015 0948   MCH 30.4 08/07/2015 0948   MCHC 33.0 08/07/2015 0948   RDW 14.8 08/07/2015 0948   LYMPHSABS 3.3 08/07/2015 0948   MONOABS  0.7 08/07/2015 0948   EOSABS 0.2 08/07/2015 0948   BASOSABS 0.0 08/07/2015 0948      Chemistry      Component Value Date/Time   NA 137 08/07/2015 0948   K 3.6 08/07/2015 0948   CL 103 08/07/2015 0948   CO2 25 08/07/2015 0948   BUN 10 08/07/2015 0948   CREATININE 0.65 08/07/2015 0948      Component Value Date/Time   CALCIUM 9.3 08/07/2015 0948   ALKPHOS 87 08/07/2015 0948   AST 22 08/07/2015 0948   ALT 23 08/07/2015 0948   BILITOT 0.5 08/07/2015 0948     Lab Results  Component Value Date   CEA 187.7* 08/07/2015      PENDING LABS:   RADIOGRAPHIC STUDIES:  Ct Chest W Contrast  09/03/2015  CLINICAL DATA:  Colorectal carcinoma diagnosis 2015. Additional lesions in the liver lungs. Chemotherapy ongoing. EXAM: CT CHEST, ABDOMEN, AND PELVIS WITH CONTRAST TECHNIQUE: Multidetector CT imaging of the chest, abdomen and pelvis was performed following the standard protocol during bolus administration of intravenous contrast. CONTRAST:  183m OMNIPAQUE IOHEXOL 300 MG/ML  SOLN COMPARISON:  CT 04/02/2015 FINDINGS: CT CHEST  FINDINGS Mediastinum/Nodes: No axillary or supraclavicular lymphadenopathy. Subcarinal lymph node measures 10 mm short axis compared to 14 mm on prior remeasured. No hilar lymphadenopathy. No pericardial fluid esophagus normal. Lungs/Pleura: Bilateral pulmonary nodules and masses are not significant changed from prior. For example RIGHT upper lobe nodule closer 17 mm (image 11, series 6) compared to 17 mm. RIGHT upper lobe nodule measures 22 mm (image 16) compared to 24 mm. RIGHT lower lobe mass measures 41 x 43 mm compared to 41 x 47 mm. LEFT lower lobe nodule measures 12 mm compared to 11 mm (image 21, series 6). Musculoskeletal: No aggressive osseous lesion. CT ABDOMEN AND PELVIS FINDINGS Hepatobiliary: Interval progression of hepatic mass metastasis with increase in the subtle hypodense lesions in liver which are now represented by a large round lesions. For example 20 mm lesion in the RIGHT hepatic lobe on image 31, series 2. 17 mm lesion the RIGHT hepatic lobe on image 35). 50 mm lesion the lower aspect of the RIGHT hepatic lobe on image 64. Pancreas: Pancreas is normal. No ductal dilatation. No pancreatic inflammation. Spleen: Normal spleen Adrenals/urinary tract: Adrenal glands and kidneys are normal. The ureters and bladder normal. Stomach/Bowel: Stomach, small bowel, appendix, and cecum are normal. The colon and rectosigmoid colon are normal. Vascular/Lymphatic: Abdominal aorta is normal caliber. There is no retroperitoneal or periportal lymphadenopathy. No pelvic lymphadenopathy. Mesenteric lymph nodes described on comparison exam are similar. Example 9 mm lymph node in the sigmoid mesial colon image 85, series 2. Reproductive: Stable uterus Other: No free fluid. Musculoskeletal: No aggressive osseous lesion. IMPRESSION: Chest Impression: 1. Stable metastatic bilateral pulmonary nodules and masses. 2. Stable to mildly decreased mediastinal adenopathy. Abdomen / Pelvis Impression: 1. Multiple new hepatic  metastasis in the RIGHT hepatic lobe corresponds to subtle hypodensities identified on comparison exam. 2. Small mesenteric lymph nodes along the sigmoid colon are unchanged. Electronically Signed   By: SSuzy BouchardM.D.   On: 09/03/2015 18:14   Ct Abdomen Pelvis W Contrast  09/03/2015  CLINICAL DATA:  Colorectal carcinoma diagnosis 2015. Additional lesions in the liver lungs. Chemotherapy ongoing. EXAM: CT CHEST, ABDOMEN, AND PELVIS WITH CONTRAST TECHNIQUE: Multidetector CT imaging of the chest, abdomen and pelvis was performed following the standard protocol during bolus administration of intravenous contrast. CONTRAST:  1071mOMNIPAQUE IOHEXOL 300 MG/ML  SOLN COMPARISON:  CT 04/02/2015 FINDINGS: CT CHEST FINDINGS Mediastinum/Nodes: No axillary or supraclavicular lymphadenopathy. Subcarinal lymph node measures 10 mm short axis compared to 14 mm on prior remeasured. No hilar lymphadenopathy. No pericardial fluid esophagus normal. Lungs/Pleura: Bilateral pulmonary nodules and masses are not significant changed from prior. For example RIGHT upper lobe nodule closer 17 mm (image 11, series 6) compared to 17 mm. RIGHT upper lobe nodule measures 22 mm (image 16) compared to 24 mm. RIGHT lower lobe mass measures 41 x 43 mm compared to 41 x 47 mm. LEFT lower lobe nodule measures 12 mm compared to 11 mm (image 21, series 6). Musculoskeletal: No aggressive osseous lesion. CT ABDOMEN AND PELVIS FINDINGS Hepatobiliary: Interval progression of hepatic mass metastasis with increase in the subtle hypodense lesions in liver which are now represented by a large round lesions. For example 20 mm lesion in the RIGHT hepatic lobe on image 31, series 2. 17 mm lesion the RIGHT hepatic lobe on image 35). 50 mm lesion the lower aspect of the RIGHT hepatic lobe on image 64. Pancreas: Pancreas is normal. No ductal dilatation. No pancreatic inflammation. Spleen: Normal spleen Adrenals/urinary tract: Adrenal glands and kidneys are  normal. The ureters and bladder normal. Stomach/Bowel: Stomach, small bowel, appendix, and cecum are normal. The colon and rectosigmoid colon are normal. Vascular/Lymphatic: Abdominal aorta is normal caliber. There is no retroperitoneal or periportal lymphadenopathy. No pelvic lymphadenopathy. Mesenteric lymph nodes described on comparison exam are similar. Example 9 mm lymph node in the sigmoid mesial colon image 85, series 2. Reproductive: Stable uterus Other: No free fluid. Musculoskeletal: No aggressive osseous lesion. IMPRESSION: Chest Impression: 1. Stable metastatic bilateral pulmonary nodules and masses. 2. Stable to mildly decreased mediastinal adenopathy. Abdomen / Pelvis Impression: 1. Multiple new hepatic metastasis in the RIGHT hepatic lobe corresponds to subtle hypodensities identified on comparison exam. 2. Small mesenteric lymph nodes along the sigmoid colon are unchanged. Electronically Signed   By: Genevive Bi M.D.   On: 09/03/2015 18:14     PATHOLOGY:    ASSESSMENT AND PLAN:  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 Stage IV Colon Cancer, KRAS-mutated, now with progression of disease on CT CAP from 09/03/2015 after being on XELIRI from 04/24/2015- 08/07/2015.  Oncology history is updated.  I personally reviewed and went over radiographic studies with the patient.  The results are noted within this dictation.   Previous pathology is being tested for MSI to help guide future treatment options.  If MSI is high and she be a candidate for Keytruda intervention. She is given a little bit of information regarding Keytruda being immunotherapy is given through IV every 21 days. I briefly reviewed the risks, benefits, alternatives, and side effects of this therapy. Based upon pathology results, she will need chemotherapy teaching for this if this is the route that chosen. If her MSI testing is low, she may be a candidate for regorafenib, Lonsurf, or clinical trial. She is in excellent performance  status at this point in time. If she so desires, she would be a candidate for palliative, salvage intervention moving forward.   I broached CODE STATUS with the patient. She is given a MOST form. She is advised to review this and either fill it out or consider all of her options regarding CODE STATUS. No CODE STATUS changes were ascertained today. She will remain a full code at this point in time. Moving forward, she be best served with a change in CODE STATUS.  She has an appointment with Dr. Gerilyn Pilgrim, neurologist,  for her headaches and her findings on her MRI of brain. Dr. Freddie Apley office has had a difficult time making contact with the patient. Her appointment is March 20. [place] will contact Dr. Freddie Apley office regarding this appointment and any financial issues that may impact her.  Her noncompliance is discouraged.  Return on 09/24/2015 for follow-up. I hope to start treatment prior to that based upon what pathology results revealed. She will be leaving for a cruise on March 3 and returning on March 11/12th. This trip was a gift to her and is already paid for. We will work around this the best we can.  ADDENDUM: Pathology from 02/15/2014 demonstrates a very low probability that microsatellite instability is present. As a result, she is not a candidate for Keytruda. Therefore, she would be best served with an oral agent such as Stivarga. Prescription is prescribed 460 mg by mouth days 1 through 21 every 28 days. She will need chemotherapy teaching for [place]. Message has been sent to Lupita Raider, nurse navigator. Prescription is printed and provided to Lendell Caprice, prior authorization specialist.  Medically noncompliant Patient reported abnormal headaches.  MRI of brain was performed in Jan 2017 and she was referred to neurology based upon the results.  Patient reported not receiving appointment details, Dr. Freddie Apley office staff confirm that on multiple occassions they have contacted  the patient but she did not return phone calls.  Patient fails to follow-up for appointments, lab appointments, and scheduled chemotherapy appointments.  She has many other documented instances of noncompliance.    THERAPY PLAN:  Based upon pathology results, we will pursue salvage therapy. If MSI high, she be a candidate for Keytruda. If MSI is low she'll be a candidate for regorafenib, Lonsurf, or clinical trial. She has a excellent performance status at this point in time. Compliance is a big issue for this patient.  All questions were answered. The patient knows to call the clinic with any problems, questions or concerns. We can certainly see the patient much sooner if necessary.  Patient and plan discussed with Dr. Ancil Linsey and she is in agreement with the aforementioned.   This note is electronically signed by: Doy Mince 09/10/2015 5:59 PM

## 2015-09-06 NOTE — Assessment & Plan Note (Addendum)
Stage IV Colon Cancer, KRAS-mutated, now with progression of disease on CT CAP from 09/03/2015 after being on XELIRI from 04/24/2015- 08/07/2015.  Oncology history is updated.  I personally reviewed and went over radiographic studies with the patient.  The results are noted within this dictation.   Previous pathology is being tested for MSI to help guide future treatment options.  If MSI is high and she be a candidate for Keytruda intervention. She is given a little bit of information regarding Keytruda being immunotherapy is given through IV every 21 days. I briefly reviewed the risks, benefits, alternatives, and side effects of this therapy. Based upon pathology results, she will need chemotherapy teaching for this if this is the route that chosen. If her MSI testing is low, she may be a candidate for regorafenib, Lonsurf, or clinical trial. She is in excellent performance status at this point in time. If she so desires, she would be a candidate for palliative, salvage intervention moving forward.   I broached CODE STATUS with the patient. She is given a MOST form. She is advised to review this and either fill it out or consider all of her options regarding CODE STATUS. No CODE STATUS changes were ascertained today. She will remain a full code at this point in time. Moving forward, she be best served with a change in [place].  She has an appointment with Dr. Merlene Laughter, neurologist, for her headaches and her findings on her MRI of brain. Dr. Freddie Apley office has had a difficult time making contact with the patient. Her appointment is March 20. Mc will contact Dr. Freddie Apley office regarding this appointment and any financial issues that may impact her.  Her noncompliance is discouraged.  Return on 09/24/2015 for follow-up. I hope to start treatment prior to that based upon what pathology results revealed. She will be leaving for a cruise on March 3 and returning on March 11/12th. This trip was  a gift to her and is already paid for. We will work around this the best we can.  ADDENDUM: Pathology from 02/15/2014 demonstrates a very low probability that microsatellite instability is present. As a result, she is not a candidate for Keytruda. Therefore, she would be best served with an oral agent such as Stivarga. Prescription is prescribed 460 mg by mouth days 1 through 21 every 28 days. She will need chemotherapy teaching for [place]. Message has been sent to Lupita Raider, nurse navigator. Prescription is printed and provided to Lendell Caprice, prior authorization specialist.

## 2015-09-06 NOTE — Assessment & Plan Note (Signed)
Patient reported abnormal headaches.  MRI of brain was performed in Jan 2017 and she was referred to neurology based upon the results.  Patient reported not receiving appointment details, Dr. Freddie Apley office staff confirm that on multiple occassions they have contacted the patient but she did not return phone calls.  Patient fails to follow-up for appointments, lab appointments, and scheduled chemotherapy appointments.  She has many other documented instances of noncompliance.

## 2015-09-10 MED ORDER — REGORAFENIB 40 MG PO TABS: 160.0000 mg | ORAL_TABLET | Freq: Every day | ORAL | Status: DC

## 2015-09-10 NOTE — Addendum Note (Signed)
Addended by: Baird Cancer on: 09/10/2015 06:01 PM   Modules accepted: Orders, Medications

## 2015-09-11 ENCOUNTER — Other Ambulatory Visit (HOSPITAL_COMMUNITY): Payer: Self-pay | Admitting: Oncology

## 2015-09-11 DIAGNOSIS — C189 Malignant neoplasm of colon, unspecified: Secondary | ICD-10-CM

## 2015-09-11 DIAGNOSIS — C7800 Secondary malignant neoplasm of unspecified lung: Principal | ICD-10-CM

## 2015-09-11 MED ORDER — REGORAFENIB 40 MG PO TABS: 160.0000 mg | ORAL_TABLET | Freq: Every day | ORAL | Status: DC

## 2015-09-21 NOTE — Patient Instructions (Addendum)
[place]   CHEMOTHERAPY INSTRUCTIONS  Stivarga -    Take 4 tablets daily (all at once) for 21 days in a row followed by a 7 day rest period. Repeat this cycle. Swallow tablets whole with water after a low-fat meal. Take Stivarga @ the same time each day.    A low fat meal should contain less than 600 calories and less than 30% fat.    Do not take two doses on the same day to make up for a missed dose.    Avoid drinking grapefruit juice or eating grapefruit while taking Stivarga. No St. John's Wort during treatment with Stivarga. These can affect the way Stivarga works.   The Most Common Side Effects:   Tiredness, weakness, fatigue  Frequent or loose bowel movements (diarrhea)  Loss of appetite  Swelling, pain, and redness of the lining of your mouth, throat, stomach, and bowel (mucositis)  Voice changes or hoarseness  Infection  Pain in other parts of your body  Weight Loss  Stomach area (abdomen) pain  Fever  Nausea  Store this medication at room temperature. Keep Stivarga in the bottle that it comes in. Do not put Stivarga tablets in a daily or weekly pill box. Safely throw away (discard) any unused Stivarga tablets after 7 weeks of opening the bottle.     SYMPTOMS TO REPORT AS SOON AS POSSIBLE AFTER TREATMENT:  FEVER GREATER THAN 100.5 F  CHILLS WITH OR WITHOUT FEVER  NAUSEA AND VOMITING THAT IS NOT CONTROLLED WITH YOUR NAUSEA MEDICATION  UNUSUAL SHORTNESS OF BREATH  UNUSUAL BRUISING OR BLEEDING  TENDERNESS IN MOUTH AND THROAT WITH OR WITHOUT PRESENCE OF ULCERS  URINARY PROBLEMS  BOWEL PROBLEMS  UNUSUAL RASH    Wear comfortable clothing and clothing appropriate for easy access to any Portacath or PICC line. Let us know if there is anything that we can do to make your therapy better!      I have been informed and understand all of the instructions given to me and have received a copy. I have been  instructed to call the clinic [phone] or my family physician as soon as possible for continued medical care, if indicated. I do not have any more questions at this time but understand that I may call the [facility] or the Patient Navigator at [phone] during office hours should I have questions or need assistance in obtaining follow-up care.            Regorafenib tablets What is this medicine? REGORAFENIB (RE goe RAF e nib) is a medicine that targets proteins in cancer cells and stops the cancer cells from growing. It is used to treat colorectal cancer and gastrointestinal stromal tumors (GIST). This medicine may be used for other purposes; ask your health care provider or pharmacist if you have questions. What should I tell my health care provider before I take this medicine? They need to know if you have any of these conditions: -bleeding disorders -heart disease -high blood pressure -liver disease -recent surgery -an unusual or allergic reaction to regorafenib, other medicines, foods, dyes, or preservatives -pregnant or trying to get pregnant -breast-feeding How should I use this medicine? Take this medicine by mouth with a glass of water. Follow the directions on the prescription label. Do not take it more often than directed. Take this medicine with food. Do not take with grapefruit juice. Do not stop taking except on your doctor's advice. Talk to your  pediatrician regarding the use of this medicine in children. Special care may be needed. Overdosage: If you think you have taken too much of this medicine contact a poison control center or emergency room at once. NOTE: This medicine is only for you. Do not share this medicine with others. What if I miss a dose? If you miss a dose, take it as soon as you can. If it is almost time for your next dose, take only that dose. Do not take double or extra doses. What may interact with this medicine? This medicine may  interact with the following: -carbamazepine -irinotecan -itraconazole -ketoconazole -phenobarbital -phenytoin -posaconazole -rifampin -St. John's Wort -telithromycin -voriconazole -warfarin This list may not describe all possible interactions. Give your health care provider a list of all the medicines, herbs, non-prescription drugs, or dietary supplements you use. Also tell them if you smoke, drink alcohol, or use illegal drugs. Some items may interact with your medicine. What should I watch for while using this medicine? This drug may make you feel generally unwell. This is not uncommon, as chemotherapy can affect healthy cells as well as cancer cells. Report any side effects. Continue your course of treatment even though you feel ill unless your doctor tells you to stop. You may need blood work done while you are taking this medicine. Do not become pregnant while taking this medicine or for 2 months after stopping it. Women should inform their doctor if they wish to become pregnant or think they might be pregnant. Men should not father a child while taking this medicine and for 2 months after stopping it. There is a potential for serious side effects to an unborn child. Talk to your health care professional or pharmacist for more information. Do not breast-feed an infant while taking this medicine. If you are going to have surgery or any other procedures, tell your doctor you are taking this medicine. Talk to your doctor about your risk of cancer. You may be more at risk for certain types of cancers if you take this medicine. What side effects may I notice from receiving this medicine? Side effects that you should report to your doctor or health care professional as soon as possible: -allergic reactions like skin rash, itching or hives, swelling of the face, lips, or tongue -bloody or black, tarry stools -breathing problems -changes in vision -chest pain or chest  tightness -confusion -dizziness -feeling faint or lightheaded -high fever -light-colored stools -nausea, vomiting -red or dark-brown urine -red spots on the skin -right upper belly pain -seizures -severe headache -sores on the hands or feet -spitting up blood or brown material that looks like coffee grounds -stomach pain -unusual bruising or bleeding from the eye, gums, or nose -unusually weak or tired -yellowing of the eyes or skin Side effects that usually do not require medical attention (Report these to your doctor or health care professional if they continue or are bothersome.): -diarrhea -hoarseness -loss of appetite -sore throat -tiredness -weight loss This list may not describe all possible side effects. Call your doctor for medical advice about side effects. You may report side effects to FDA at 1-800-FDA-1088. Where should I keep my medicine? Keep out of the reach of children. Store between 20 and 25 degrees C (68 and 77 degrees F). Keep this medicine in the original container. Throw away any unused medicine after the expiration date. NOTE: This sheet is a summary. It may not cover all possible information. If you have questions about this medicine, talk  to your doctor, pharmacist, or health care provider.    2016, [employer]. (2014-08-29 14:34:12)

## 2015-09-24 ENCOUNTER — Telehealth (HOSPITAL_COMMUNITY): Payer: Self-pay | Admitting: *Deleted

## 2015-09-24 ENCOUNTER — Encounter (HOSPITAL_COMMUNITY): Payer: Self-pay | Admitting: Hematology & Oncology

## 2015-09-24 ENCOUNTER — Encounter (HOSPITAL_COMMUNITY): Payer: Medicaid Other

## 2015-09-24 ENCOUNTER — Inpatient Hospital Stay (HOSPITAL_COMMUNITY)

## 2015-09-24 ENCOUNTER — Encounter (HOSPITAL_COMMUNITY): Payer: Medicaid Other | Attending: Oncology | Admitting: Hematology & Oncology

## 2015-09-24 VITALS — BP 117/83 | HR 94 | Temp 97.6°F | Resp 18 | Wt 254.0 lb

## 2015-09-24 DIAGNOSIS — C7800 Secondary malignant neoplasm of unspecified lung: Secondary | ICD-10-CM | POA: Diagnosis not present

## 2015-09-24 DIAGNOSIS — C187 Malignant neoplasm of sigmoid colon: Secondary | ICD-10-CM

## 2015-09-24 DIAGNOSIS — C787 Secondary malignant neoplasm of liver and intrahepatic bile duct: Secondary | ICD-10-CM

## 2015-09-24 DIAGNOSIS — R51 Headache: Secondary | ICD-10-CM

## 2015-09-24 DIAGNOSIS — I82621 Acute embolism and thrombosis of deep veins of right upper extremity: Secondary | ICD-10-CM

## 2015-09-24 DIAGNOSIS — Z95828 Presence of other vascular implants and grafts: Secondary | ICD-10-CM

## 2015-09-24 DIAGNOSIS — C189 Malignant neoplasm of colon, unspecified: Secondary | ICD-10-CM | POA: Insufficient documentation

## 2015-09-24 DIAGNOSIS — R519 Headache, unspecified: Secondary | ICD-10-CM

## 2015-09-24 NOTE — Telephone Encounter (Signed)
There was not significant tissue for her2 testing per [facility] in Pathology. Dr. Whitney Muse notified.

## 2015-09-24 NOTE — Patient Instructions (Addendum)
[place] at [facility] Discharge Instructions  RECOMMENDATIONS MADE BY THE CONSULTANT AND ANY TEST RESULTS WILL BE SENT TO YOUR REFERRING PHYSICIAN.   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  To afford each patient quality time with our provider, please arrive at least 15 minutes before your scheduled appointment time.   Beginning January 23rd 2017 lab work for the [name] will be done in the  Main lab at [facility] on 1st floor. If you have a lab appointment with the [facility] please come in thru the  Main Entrance and check in at the main information desk  You need to re-schedule your appointment should you arrive 10 or more minutes late.  We strive to give you quality time with our providers, and arriving late affects you and other patients whose appointments are after yours.  Also, if you no show three or more times for appointments you may be dismissed from the clinic at the providers discretion.     Again, thank you for choosing [facility].  Our hope is that these requests will decrease the amount of time that you wait before being seen by our physicians.       _____________________________________________________________  Should you have questions after your visit to [facility], please contact our office at [phone] between the hours of 8:30 a.m. and 4:30 p.m.  Voicemails left after 4:30 p.m. will not be returned until the following business day.  For prescription refill requests, have your pharmacy contact  our office.         Resources For Cancer Patients and their Caregivers ? American Cancer Society: Can assist with transportation, wigs, general needs, runs Look Good Feel Better.        (720)756-9432 ? Cancer Care: Provides financial assistance, online support groups, medication/co-pay assistance.  1-800-813-HOPE 440-835-8105) ? [facility] Assists [place] cancer patients and their families through emotional , educational and financial support.  (731)576-1372 ? [place] DSS Where to apply for food stamps, Medicaid and utility assistance. 413 278 7804 ? [facility]: Transportation to medical appointments. 614-428-5467 ? Social Security Administration: May apply for disability if have a Stage IV cancer. 309-491-8260 (475)232-3623 ? [employer], Disability and Transit Services: Assists with nutrition, care and transit needs. 423-131-6571

## 2015-09-24 NOTE — Telephone Encounter (Signed)
Requested HER 2 testing on specimen [phone] today.

## 2015-09-24 NOTE — Progress Notes (Addendum)
Chemo teaching done and consent signed for [facility]. Stivarga to start Tues 09/25/15. Chemo calendar given to patient.

## 2015-09-24 NOTE — Progress Notes (Signed)
[place] Progress Note  Patient Care Team: No Pcp Per Patient as PCP - General ([place]) Melrose Nakayama, MD as Consulting Physician (Cardiothoracic Surgery)  CHIEF COMPLAINTS/PURPOSE OF CONSULTATION:  Stage IV CRC CT of abdomen and pelvis on 02/20/2014 at [facility] showing 3.6 cm apple core lesion in the proximal sigmoid colon 11 mm hypoenhancing lesion in the medial left hepatic dome, 4.1 cm right lower lobe mass and adjacent right lower lobe nodule CEA on 08/18/2014 of 93.7 ng/ml Right upper extremity DVT 04/17/2014 paired brachial veins, axillary vein, and peripheral aspect of the right subclavian vein CT chest 07/06/2014 with bilateral pulmonary nodules and masses, RUL, lateral RUL, posterior LUL, lobulated nodule in the subpleural RLL,     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 ([place])   02/20/2014 Imaging CT of abdomen and pelvis on 02/20/2014 at [facility] showing 3.6 cm apple core lesion in the proximal sigmoid colon 11 mm hypoenhancing lesion in the medial left hepatic dome, 4.1 cm right lower lobe mass and adjacent right lower lobe nodule   02/23/2014 Pathology Results KRAS MUTATED   04/17/2014 Imaging Right upper extremity DVT 04/17/2014 paired brachial veins, axillary vein, and peripheral aspect of the right subclavian vein   07/06/2014 Imaging CT chest 07/06/2014 with bilateral pulmonary nodules and masses, RUL, lateral RUL, posterior LUL, lobulated nodule in the subpleural RLL   08/18/2014 Tumor Marker CEA 93.7 ng/ml   10/09/2014 Initial Diagnosis 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   02/19/2015 Imaging Progressive pulmonary metastatic disease as detailed above. Stable mediastinal lymphadenopathy. No abdominal/pelvic metastatic disease is identified   04/24/2015 - 08/07/2015 Chemotherapy XELIRI started on  04/24/2015. Patient did not want to wear 5-FU pump.  On 2000 mg BID 14 days on and 7 days off.   04/28/2015 Adverse Reaction Mild stomatitis.  Xeloda held.  Treated with Magic Mouthwash.  Resolved by 05/02/2015 at office visit.   05/15/2015 Treatment Plan Change Will restart Xeloda at 1500 mg BID 14 days on and 7 days off (11/1)   07/17/2015 Adverse Reaction Mouth sores   07/17/2015 Treatment Plan Change Change in Xeloda frequency: 1500 mg BID 7 days on and 7 days off.   07/24/2015 Adverse Reaction Increased nausea/vomiting   08/07/2015 Treatment Plan Change Irinotecan dose reduced by 10%   08/10/2015 Imaging MRI brain- Scattered punctate subcortical T2 hyperintensities bilaterally are slightly greater than expected for age. The finding is nonspecific but can be seen in the setting of chronic microvascular ischemia, a demyelinating process such as multiple...   09/03/2015 Progression CT CAP- demonstrates new hepatic lesions   09/03/2015 Imaging CT CAP- Multiple new hepatic metastasis in the R hepatic lobe corresponds to subtle hypodensities identified on comparison exam.  Stable metastatic bilateral pulmonary nodules and masses. Small mesenteric lymph nodes along the sigmoid colon are unchanged    HISTORY OF PRESENTING ILLNESS:   Erica Barker 55 y.o. female is here because of stage IV colon cancer. She has progressive disease. Unfortunately is kras mutated and MSI stable.   Ms. Titsworth was here alone today.  She said that she feels great. She recently got back from a cruise to [place] and is planning one for [place] next.  She has been afraid to take her Stivarga medication because she read some bad reviews of it online. She was reassured about this.  Denies abdominal pain, nausea, and  vomiting but states that she still has headaches. She has an upcoming appointment with Dr. Merlene Laughter for these. She had one previously but did not attend. MRI brain was done on 08/10/2015.  MEDICAL HISTORY:  Past  Medical History  Diagnosis Date  . Hypertension   . Depression   . Pulmonary nodules 01/30/14    CTA CHEST  . Lymphadenopathy, mediastinal 01/2014    CTA ANGIO [place]  . Morbid obesity ([place])   . Diabetes mellitus, type II ([place])   . Headache(784.0)   . Medically noncompliant 09/06/2015    SURGICAL HISTORY: Past Surgical History  Procedure Laterality Date  . Cholecystectomy    . Video bronchoscopy with endobronchial ultrasound N/A 02/06/2014    Procedure: VIDEO BRONCHOSCOPY WITH ENDOBRONCHIAL ULTRASOUND,bronchial biopsies , node sampling;  Surgeon: Melrose Nakayama, MD;  Location: [place];  Service: Thoracic;  Laterality: N/A;  . Tubal ligation      SOCIAL HISTORY: Social History   Social History  . Marital Status: Widowed    Spouse Name: N/A  . Number of Children: N/A  . Years of Education: N/A   Occupational History  . Not on file.   Social History Main Topics  . Smoking status: Former Smoker -- 0.50 packs/day for 7 years    Types: Cigarettes    Quit date: 02/03/1989  . Smokeless tobacco: Never Used  . Alcohol Use: Yes     Comment: occasional  . Drug Use: No  . Sexual Activity: Not on file   Other Topics Concern  . Not on file   Social History Narrative  She is in a relationship. 3 children. Aged 32,35,20.  She has no grandchildren. She smoked in high school but quit after graduation. No ETOH. 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  She is originally from [place].  FAMILY HISTORY: Family History  Problem Relation Age of Onset  . Cancer Mother     UTERINE  . Cancer Father     LEUKEMIA  . Crohn's disease Son    indicated that her mother is alive. She indicated that her father is deceased. She indicated that her son is alive.   Father is deceased at age 12 from leukemia, mother is alive at 18 with a history of uterine cancer.  She has one sister how is healthy and one brother who is healthy. He works as a  Engineer, structural.   ALLERGIES:  is allergic to asa.  MEDICATIONS:  Current Outpatient Prescriptions  Medication Sig Dispense Refill  . aspirin-acetaminophen-caffeine (EXCEDRIN MIGRAINE) 250-250-65 MG per tablet Take 2 tablets by mouth every 6 (six) hours as needed for headache.    . dexlansoprazole (DEXILANT) 60 MG capsule Take 1 capsule (60 mg total) by mouth daily. 30 capsule 5  . Diphenhyd-Hydrocort-Nystatin (FIRST-DUKES MOUTHWASH) SUSP Use as directed 5 mLs in the mouth or throat 4 (four) times daily as needed. 300 mL 2  . diphenoxylate-atropine (LOMOTIL) 2.5-0.025 MG per tablet Take 1 tablet by mouth every 4 (four) hours as needed for diarrhea or loose stools. 45 tablet 0  . escitalopram (LEXAPRO) 20 MG tablet Take 1/2 tablet by mouth daily for 5 days then increase to one tablet daily 30 tablet 6  . granisetron (KYTRIL) 1 MG tablet On days of taking Xeloda, take 1 tab 30 min prior to Xeloda in the am and pm. 56 tablet 3  . lisinopril (PRINIVIL,ZESTRIL) 10 MG tablet Take 1 tablet (10 mg  total) by mouth daily. 30 tablet 6  . loperamide (IMODIUM) 2 MG capsule Take 2 caps after 1st loose stool then 1 cap every 2 hours until you go 12 hours without having a loose stool. At bedtime for diarrhea, take 2 caps then 2 caps every 4 hours until morning. 60 capsule 0  . magic mouthwash SOLN Swish and swallow 91m by mouth every 4 hours as needed for mouth pain. 360 mL 3  . metFORMIN (GLUCOPHAGE) 500 MG tablet Take 1 tablet (500 mg total) by mouth daily with breakfast. 30 tablet 3  . Multiple Vitamin (MULTIVITAMIN) capsule Take 1 capsule by mouth daily.    .Marland KitchenoxyCODONE-acetaminophen (PERCOCET/ROXICET) 5-325 MG tablet Take 1 tablet by mouth every 4 (four) hours as needed for severe pain. 60 tablet 0  . pantoprazole (PROTONIX) 40 MG tablet Take 1 tablet (40 mg total) by mouth daily. 30 tablet 3  . promethazine (PHENERGAN) 25 MG suppository Place 1 suppository (25 mg total) rectally every 6 (six) hours as needed  for nausea or vomiting. 12 each 0  . rivaroxaban (XARELTO) 20 MG TABS tablet Take 1 tablet (20 mg total) by mouth daily with supper. 30 tablet 3  . regorafenib (STIVARGA) 40 MG tablet Take 4 tablets (160 mg total) by mouth daily with breakfast. Take with low fat meal. Caution: Chemotherapy. (Patient not taking: Reported on 09/24/2015) 84 tablet 0   No current facility-administered medications for this visit.    Review of Systems  Constitutional: Negative for fever, chills, weight loss and malaise/fatigue.  HENT: Positive for headaches Negative for congestion, hearing loss, nosebleeds, sore throat and tinnitus.  Described as migraines that occur every 3-4 days, alleviated with pain medication. Eyes: Negative for blurred vision, double vision, pain and discharge.  Respiratory: Negative for cough, hemoptysis, sputum production, shortness of breath and wheezing.   Cardiovascular: Negative for chest pain, palpitations, claudication, leg swelling and PND.  Gastrointestinal: Negative for nausea, vomiting, abdominal pain, diarrhea, constipation, heartburn, blood in stool and melena.  Genitourinary: Negative for dysuria, urgency, frequency and hematuria.  Musculoskeletal: Negative for myalgias, joint pain and falls.  Skin:  Negative for rash and skin changes  Neurological: Negative for dizziness, tingling, tremors, sensory change, speech change, focal weakness, seizures, loss of consciousness, weakness. Endo/Heme/Allergies: Does not bruise/bleed easily.  Psychiatric/Behavioral: Negative for depression, suicidal ideas, memory loss and substance abuse. The patient is not nervous/anxious and does not have insomnia.   14 point review of systems was performed and is negative except as detailed under history of present illness and above  PHYSICAL EXAMINATION: ECOG PERFORMANCE STATUS: 1 - Symptomatic but completely ambulatory  Filed Vitals:   09/24/15 0833  BP: 117/83  Pulse: 94  Temp: 97.6 F (36.4 C)    Resp: 18   Filed Weights   09/24/15 0833  Weight: 254 lb (115.214 kg)    Physical Exam  Constitutional: She is oriented to person, place, and time and well-developed, well-nourished, and in no distress.  HENT:  Head: Normocephalic and atraumatic.  Nose: Nose normal.  Mouth/Throat: Oropharynx is clear and moist. No oropharyngeal exudate.  Eyes: Conjunctivae and EOM are normal. Pupils are equal, round, and reactive to light. Right eye exhibits no discharge. Left eye exhibits no discharge. No scleral icterus.  Neck: Normal range of motion. Neck supple. No tracheal deviation present. No thyromegaly present.  Cardiovascular: Normal rate, regular rhythm and normal heart sounds.  Exam reveals no gallop and no friction rub.  No murmur heard. Pulmonary/Chest: Effort normal  and breath sounds normal. She has no wheezes. She has no rales.  Abdominal: Soft. Bowel sounds are normal. She exhibits no distension and no mass. There is no tenderness. There is no rebound and no guarding.  Musculoskeletal: Normal range of motion. She exhibits no edema.  Lymphadenopathy:    She has no cervical adenopathy.  Neurological: She is alert and oriented to person, place, and time. She has normal reflexes. No cranial nerve deficit. Gait normal. Coordination normal.  Skin: Skin is warm and dry. No rash noted.  Psychiatric: Mood, memory, affect and judgment normal.  Nursing note and vitals reviewed.  LABORATORY DATA:  I have reviewed the data as listed.  CBC    Component Value Date/Time   WBC 9.0 08/07/2015 0948   RBC 4.28 08/07/2015 0948   RBC 4.36 11/16/2014 1214   HGB 13.0 08/07/2015 0948   HCT 39.4 08/07/2015 0948   PLT 244 08/07/2015 0948   MCV 92.1 08/07/2015 0948   MCH 30.4 08/07/2015 0948   MCHC 33.0 08/07/2015 0948   RDW 14.8 08/07/2015 0948   LYMPHSABS 3.3 08/07/2015 0948   MONOABS 0.7 08/07/2015 0948   EOSABS 0.2 08/07/2015 0948   BASOSABS 0.0 08/07/2015 0948    CMP     Component Value  Date/Time   NA 137 08/07/2015 0948   K 3.6 08/07/2015 0948   CL 103 08/07/2015 0948   CO2 25 08/07/2015 0948   GLUCOSE 229* 08/07/2015 0948   BUN 10 08/07/2015 0948   CREATININE 0.65 08/07/2015 0948   CALCIUM 9.3 08/07/2015 0948   PROT 7.3 08/07/2015 0948   ALBUMIN 3.5 08/07/2015 0948   AST 22 08/07/2015 0948   ALT 23 08/07/2015 0948   ALKPHOS 87 08/07/2015 0948   BILITOT 0.5 08/07/2015 0948   GFRNONAA >60 08/07/2015 0948   GFRAA >60 08/07/2015 0948    RADIOGRAPHIC STUDIES I have personally reviewed the radiological images as listed and agreed with the findings in the report.  Study Result     CLINICAL DATA: Colorectal carcinoma diagnosis 2015. Additional lesions in the liver lungs. Chemotherapy ongoing.  EXAM: CT CHEST, ABDOMEN, AND PELVIS WITH CONTRAST  TECHNIQUE: Multidetector CT imaging of the chest, abdomen and pelvis was performed following the standard protocol during bolus administration of intravenous contrast.  CONTRAST: 161m OMNIPAQUE IOHEXOL 300 MG/ML SOLN  COMPARISON: CT 04/02/2015  FINDINGS: CT CHEST FINDINGS  Mediastinum/Nodes: No axillary or supraclavicular lymphadenopathy. Subcarinal lymph node measures 10 mm short axis compared to 14 mm on prior remeasured. No hilar lymphadenopathy. No pericardial fluid esophagus normal.  Lungs/Pleura: Bilateral pulmonary nodules and masses are not significant changed from prior.  For example RIGHT upper lobe nodule closer 17 mm (image 11, series 6) compared to 17 mm.  RIGHT upper lobe nodule measures 22 mm (image 16) compared to 24 mm.  RIGHT lower lobe mass measures 41 x 43 mm compared to 41 x 47 mm.  LEFT lower lobe nodule measures 12 mm compared to 11 mm (image 21, series 6).  Musculoskeletal: No aggressive osseous lesion.  CT ABDOMEN AND PELVIS FINDINGS  Hepatobiliary: Interval progression of hepatic mass metastasis with increase in the subtle hypodense lesions in liver which  are now represented by a large round lesions. For example 20 mm lesion in the RIGHT hepatic lobe on image 31, series 2. 17 mm lesion the RIGHT hepatic lobe on image 35). 50 mm lesion the lower aspect of the RIGHT hepatic lobe on image 64.  Pancreas: Pancreas is normal. No ductal  dilatation. No pancreatic inflammation.  Spleen: Normal spleen  Adrenals/urinary tract: Adrenal glands and kidneys are normal. The ureters and bladder normal.  Stomach/Bowel: Stomach, small bowel, appendix, and cecum are normal. The colon and rectosigmoid colon are normal.  Vascular/Lymphatic: Abdominal aorta is normal caliber. There is no retroperitoneal or periportal lymphadenopathy. No pelvic lymphadenopathy.  Mesenteric lymph nodes described on comparison exam are similar. Example 9 mm lymph node in the sigmoid mesial colon image 85, series 2.  Reproductive: Stable uterus  Other: No free fluid.  Musculoskeletal: No aggressive osseous lesion.  IMPRESSION: Chest Impression:  1. Stable metastatic bilateral pulmonary nodules and masses. 2. Stable to mildly decreased mediastinal adenopathy.  Abdomen / Pelvis Impression:  1. Multiple new hepatic metastasis in the RIGHT hepatic lobe corresponds to subtle hypodensities identified on comparison exam. 2. Small mesenteric lymph nodes along the sigmoid colon are unchanged.   Electronically Signed  By: Suzy Bouchard M.D.  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  At times the patient seems to be very accepting of her disease and prognosis and other times she is very difficult time addressing it. She currently has very good  quality of life and is very active.   She was here today for a follow up. Unfortunately she has progressed through FOLFOX/AVASTIN and FOLFIRI/AVASTIN she has been on XELODA.   She has been afraid of taking her Stivarga medication but was reassured today. Hildred Alamin will do teaching with her today. She will be seen next week for assessment of tolerance.   Her tumor will be tested for HER2.  She did not need any refills today.  She will return in a week for a follow up and labs.   All questions were answered. The patient knows to call the clinic with any problems, questions or concerns.    This document serves as a record of services personally performed by Ancil Linsey, MD. It was created on her behalf by Kandace Blitz, a trained medical scribe. The creation of this record is based on the scribe's personal observations and the provider's statements to them. This document has been checked and approved by the attending provider.  I have reviewed the above documentation for accuracy and completeness, and I agree with the above.  Kelby Fam. Whitney Muse, MD

## 2015-09-26 ENCOUNTER — Encounter (HOSPITAL_COMMUNITY)

## 2015-09-27 ENCOUNTER — Telehealth (HOSPITAL_COMMUNITY): Payer: Self-pay | Admitting: *Deleted

## 2015-09-27 NOTE — Telephone Encounter (Signed)
Patient let me know how she was doing after taking Stivarga. The first day was good. The second day of taking it left her having abdominal pain, nausea, and then a headache. She decided to not take a nausea pill and just "toughened it out". Pt instructed to take a nausea pill daily with Stivarga if needed and to use her Phenergan suppositories if needed. She said ok. She said she understood that the medicine had to get in her system good. Pt aware to call back with any questions, problems, or concerns.

## 2015-09-30 NOTE — Assessment & Plan Note (Addendum)
Stage IV Colon Cancer, KRAS-mutated, now with progression of disease on CT CAP from 09/03/2015 after being on XELIRI from 04/24/2015- 08/07/2015 resulting in a change in therapy to Stivarga 160 mg PO days 1-21 every 28 days beginning on 09/24/2015.  Oncology history is updated.  Labs today: CBC diff, CMET  In an attempt to continue the conversation regarding goals-of-care, I have provided her a pamphlet regarding HCPOA and Living Will and i have asked her to consider filling out this paper work with the assistance of the resources available to her at [facility].  I will reach out to [name], chaplain, in the future if she is agreeable to pursing these two issues.  CODE STATUS will need to be re-addressed in the future.   She has an appointment with Dr. Merlene Laughter, neurologist, for her headaches and her findings on her MRI of brain. Dr. Freddie Apley office has had a difficult time making contact with the patient. Her appointment is March 20. Erica Barker will contact Dr. Freddie Apley office regarding this appointment and any financial issues that may impact her.  Her noncompliance is discouraged.  Dexilant escribed  Labs in 2 weeks: CBC diff, CMET, CEA  Return in 2 weeks for follow-up.

## 2015-09-30 NOTE — Progress Notes (Signed)
No PCP Per Patient No address on file  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 [facility]) - Plan: CBC with Differential, Comprehensive metabolic panel, CEA  Gastroesophageal reflux disease, esophagitis presence not specified - Plan: dexlansoprazole (DEXILANT) 60 MG capsule  CURRENT THERAPY: Stivarga 160 mg PO daily days 1-21 every 28 days.  INTERVAL HISTORY: Erica Barker 55 y.o. female returns for followup of Stage IV colon cancer, KRAS mutated, with imaging on 09/03/2015 showing progression of disease resulting in a change in therapy to [place].    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 [facility])   02/20/2014 Imaging CT of abdomen and pelvis on 02/20/2014 at [facility] showing 3.6 cm apple core lesion in the proximal sigmoid colon 11 mm hypoenhancing lesion in the medial left hepatic dome, 4.1 cm right lower lobe mass and adjacent right lower lobe nodule   02/23/2014 Pathology Results KRAS MUTATED   04/17/2014 Imaging Right upper extremity DVT 04/17/2014 paired brachial veins, axillary vein, and peripheral aspect of the right subclavian vein   07/06/2014 Imaging CT chest 07/06/2014 with bilateral pulmonary nodules and masses, RUL, lateral RUL, posterior LUL, lobulated nodule in the subpleural RLL   08/18/2014 Tumor Marker CEA 93.7 ng/ml   10/09/2014 Initial Diagnosis 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   10/11/2014 - 01/29/2015 Chemotherapy FOLFOX + Avastin beginning at [facility]   01/29/2015 Imaging Port study- Unremarkable Port-A-Cath injection.   02/19/2015 Imaging Progressive pulmonary metastatic disease as detailed above. Stable mediastinal lymphadenopathy. No abdominal/pelvic metastatic disease is identified   04/24/2015 - 08/07/2015 Chemotherapy XELIRI started on 04/24/2015. Patient did not want to wear 5-FU pump.  On 2000 mg BID 14 days on and 7 days off.   04/28/2015 Adverse Reaction Mild stomatitis.  Xeloda held.  Treated with Magic Mouthwash.  Resolved by 05/02/2015 at office visit.   05/15/2015 Treatment Plan Change Will restart Xeloda at 1500 mg BID 14 days on and 7 days off (11/1)   07/17/2015 Adverse Reaction Mouth sores   07/17/2015 Treatment Plan Change Change in Xeloda frequency: 1500 mg BID 7 days on and 7 days off.   07/24/2015 Adverse Reaction Increased nausea/vomiting   08/07/2015 Treatment Plan Change Irinotecan dose reduced by 10%   08/10/2015 Imaging MRI brain- Scattered punctate subcortical T2 hyperintensities bilaterally are slightly greater than expected for age. The finding is nonspecific but can be seen in the setting of chronic microvascular ischemia, a demyelinating process such as multiple...   09/03/2015 Progression CT CAP- demonstrates new hepatic lesions   09/03/2015 Imaging CT CAP- Multiple new hepatic metastasis in the R hepatic lobe corresponds to subtle hypodensities identified on comparison exam.  Stable metastatic bilateral pulmonary nodules and masses. Small mesenteric lymph nodes along the sigmoid colon are unchanged   09/24/2015 -  Chemotherapy Stivarga 160 mg daily 21/28 days   I personally reviewed and went over laboratory results with the patient.  The results are noted within this dictation.  Labs will be updated today.  She is tolerating her 1st week of therapy well without any complaints.  She notes a 1 day episode of nausea without vomiting.  She notes some epigastric discomfort, but admits that she is not taking any of her prescribed PPIs.  She has Dexilant and Protonix on her medication list, both she reports that she is not taking.  A new Rx is escribed.    She otherwise feels great; "why didn't you guys give me this [regarding Stivarga] a long time ago?"  She is educated on  the recommended setting for Stivarga therapy.  In order to progress the conversation regarding death and dying and helping Erica Barker face her mortality, I have given her an advanced directive and HCPOA pamphlet to review and consider   Past Medical History  Diagnosis Date    . Hypertension   . Depression   . Pulmonary nodules 01/30/14    CTA CHEST  . Lymphadenopathy, mediastinal 01/2014    CTA ANGIO .[place]  . Morbid obesity ([place])   . Diabetes mellitus, type II ([place])   . Headache(784.0)   . Medically noncompliant 09/06/2015    has Hypertension; Depression; Pulmonary nodules; Lymphadenopathy, mediastinal; Diabetes mellitus type 2 in obese ([place]); Obesity, morbid ([place]); 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 [facility]); and Medically noncompliant on her problem list.     is allergic to asa.  Current Outpatient Prescriptions on File Prior to Visit  Medication Sig Dispense Refill  . aspirin-acetaminophen-caffeine (EXCEDRIN MIGRAINE) 250-250-65 MG per tablet Take 2 tablets by mouth every 6 (six) hours as needed for headache.    . escitalopram (LEXAPRO) 20 MG tablet Take 1/2 tablet by mouth daily for 5 days then increase to one tablet daily 30 tablet 6  . lisinopril (PRINIVIL,ZESTRIL) 10 MG tablet Take 1 tablet (10 mg total) by mouth daily. 30 tablet 6  . metFORMIN (GLUCOPHAGE) 500 MG tablet Take 1 tablet (500 mg total) by mouth daily with breakfast. 30 tablet 3  . Multiple Vitamin (MULTIVITAMIN) capsule Take 1 capsule by mouth daily.    Marland Kitchen oxyCODONE-acetaminophen (PERCOCET/ROXICET) 5-325 MG tablet Take 1 tablet by mouth every 4 (four) hours as needed for severe pain. 60 tablet 0  . promethazine (PHENERGAN) 25 MG suppository Place 1 suppository (25 mg total) rectally every 6 (six) hours as needed for nausea or vomiting. 12 each 0  . regorafenib (STIVARGA) 40 MG tablet Take 4 tablets (160 mg total) by mouth daily with breakfast. Take with low fat meal. Caution: Chemotherapy. 84 tablet 0  . rivaroxaban (XARELTO) 20 MG TABS tablet Take 1 tablet (20 mg total) by mouth daily with supper. 30 tablet 3  . Diphenhyd-Hydrocort-Nystatin (FIRST-DUKES MOUTHWASH) SUSP Use as directed 5 mLs in the mouth or throat 4 (four) times daily as needed. (Patient not taking: Reported on  10/01/2015) 300 mL 2  . diphenoxylate-atropine (LOMOTIL) 2.5-0.025 MG per tablet Take 1 tablet by mouth every 4 (four) hours as needed for diarrhea or loose stools. (Patient not taking: Reported on 10/01/2015) 45 tablet 0  . granisetron (KYTRIL) 1 MG tablet On days of taking Xeloda, take 1 tab 30 min prior to Xeloda in the am and pm. (Patient not taking: Reported on 10/01/2015) 56 tablet 3  . loperamide (IMODIUM) 2 MG capsule Take 2 caps after 1st loose stool then 1 cap every 2 hours until you go 12 hours without having a loose stool. At bedtime for diarrhea, take 2 caps then 2 caps every 4 hours until morning. (Patient not taking: Reported on 10/01/2015) 60 capsule 0  . magic mouthwash SOLN Swish and swallow 53m by mouth every 4 hours as needed for mouth pain. (Patient not taking: Reported on 10/01/2015) 360 mL 3   No current facility-administered medications on file prior to visit.    Past Surgical History  Procedure Laterality Date  . Cholecystectomy    . Video bronchoscopy with endobronchial ultrasound N/A 02/06/2014    Procedure: VIDEO BRONCHOSCOPY WITH ENDOBRONCHIAL ULTRASOUND,bronchial biopsies , node sampling;  Surgeon: SMelrose Nakayama MD;  [place]  Service:  Thoracic;  Laterality: N/A;  . Tubal ligation      Denies any headaches, dizziness, double vision, fevers, chills, night sweats, nausea, vomiting, diarrhea, constipation, chest pain, heart palpitations, shortness of breath, blood in stool, black tarry stool, urinary pain, urinary burning, urinary frequency, hematuria.   PHYSICAL EXAMINATION  ECOG PERFORMANCE STATUS: 0 - Asymptomatic  Filed Vitals:   10/01/15 0906  BP: 150/93  Pulse: 84  Temp: 97.9 F (36.6 C)  Resp: 18    GENERAL:alert, no distress, well nourished, well developed, comfortable, cooperative, obese, smiling and tearful at times during discussion, and unaccompanied SKIN: skin color, texture, turgor are normal, no rashes or significant lesions HEAD:  Normocephalic, No masses, lesions, tenderness or abnormalities EYES: normal, EOMI, Conjunctiva are pink and non-injected EARS: External ears normal OROPHARYNX:lips, buccal mucosa, and tongue normal and mucous membranes are moist  NECK: supple, trachea midline LYMPH:  no palpable lymphadenopathy BREAST:not examined LUNGS: clear to auscultation  HEART: regular rate & rhythm ABDOMEN:abdomen soft, non-tender, obese and normal bowel sounds BACK: Back symmetric, no curvature., No CVA tenderness EXTREMITIES:less then 2 second capillary refill, no joint deformities, effusion, or inflammation, no skin discoloration, no cyanosis  NEURO: alert & oriented x 3 with fluent speech, no focal motor/sensory deficits, gait normal   LABORATORY DATA: CBC    Component Value Date/Time   WBC 12.0* 10/01/2015 0848   RBC 4.62 10/01/2015 0848   RBC 4.36 11/16/2014 1214   HGB 13.8 10/01/2015 0848   HCT 40.9 10/01/2015 0848   PLT 258 10/01/2015 0848   MCV 88.5 10/01/2015 0848   MCH 29.9 10/01/2015 0848   MCHC 33.7 10/01/2015 0848   RDW 13.6 10/01/2015 0848   LYMPHSABS 3.3 10/01/2015 0848   MONOABS 0.8 10/01/2015 0848   EOSABS 0.5 10/01/2015 0848   BASOSABS 0.0 10/01/2015 0848      Chemistry      Component Value Date/Time   NA 136 10/01/2015 0848   K 3.9 10/01/2015 0848   CL 104 10/01/2015 0848   CO2 22 10/01/2015 0848   BUN 9 10/01/2015 0848   CREATININE 0.67 10/01/2015 0848      Component Value Date/Time   CALCIUM 9.2 10/01/2015 0848   ALKPHOS 95 10/01/2015 0848   AST 24 10/01/2015 0848   ALT 21 10/01/2015 0848   BILITOT 0.5 10/01/2015 0848     Lab Results  Component Value Date   CEA 187.7* 08/07/2015      PENDING LABS:   RADIOGRAPHIC STUDIES:  Ct Chest W Contrast  09/03/2015  CLINICAL DATA:  Colorectal carcinoma diagnosis 2015. Additional lesions in the liver lungs. Chemotherapy ongoing. EXAM: CT CHEST, ABDOMEN, AND PELVIS WITH CONTRAST TECHNIQUE: Multidetector CT imaging of  the chest, abdomen and pelvis was performed following the standard protocol during bolus administration of intravenous contrast. CONTRAST:  113m OMNIPAQUE IOHEXOL 300 MG/ML  SOLN COMPARISON:  CT 04/02/2015 FINDINGS: CT CHEST FINDINGS Mediastinum/Nodes: No axillary or supraclavicular lymphadenopathy. Subcarinal lymph node measures 10 mm short axis compared to 14 mm on prior remeasured. No hilar lymphadenopathy. No pericardial fluid esophagus normal. Lungs/Pleura: Bilateral pulmonary nodules and masses are not significant changed from prior. For example RIGHT upper lobe nodule closer 17 mm (image 11, series 6) compared to 17 mm. RIGHT upper lobe nodule measures 22 mm (image 16) compared to 24 mm. RIGHT lower lobe mass measures 41 x 43 mm compared to 41 x 47 mm. LEFT lower lobe nodule measures 12 mm compared to 11 mm (image 21, series 6). Musculoskeletal: No  aggressive osseous lesion. CT ABDOMEN AND PELVIS FINDINGS Hepatobiliary: Interval progression of hepatic mass metastasis with increase in the subtle hypodense lesions in liver which are now represented by a large round lesions. For example 20 mm lesion in the RIGHT hepatic lobe on image 31, series 2. 17 mm lesion the RIGHT hepatic lobe on image 35). 50 mm lesion the lower aspect of the RIGHT hepatic lobe on image 64. Pancreas: Pancreas is normal. No ductal dilatation. No pancreatic inflammation. Spleen: Normal spleen Adrenals/urinary tract: Adrenal glands and kidneys are normal. The ureters and bladder normal. Stomach/Bowel: Stomach, small bowel, appendix, and cecum are normal. The colon and rectosigmoid colon are normal. Vascular/Lymphatic: Abdominal aorta is normal caliber. There is no retroperitoneal or periportal lymphadenopathy. No pelvic lymphadenopathy. Mesenteric lymph nodes described on comparison exam are similar. Example 9 mm lymph node in the sigmoid mesial colon image 85, series 2. Reproductive: Stable uterus Other: No free fluid. Musculoskeletal: No  aggressive osseous lesion. IMPRESSION: Chest Impression: 1. Stable metastatic bilateral pulmonary nodules and masses. 2. Stable to mildly decreased mediastinal adenopathy. Abdomen / Pelvis Impression: 1. Multiple new hepatic metastasis in the RIGHT hepatic lobe corresponds to subtle hypodensities identified on comparison exam. 2. Small mesenteric lymph nodes along the sigmoid colon are unchanged. Electronically Signed   By: Suzy Bouchard M.D.   On: 09/03/2015 18:14   Ct Abdomen Pelvis W Contrast  09/03/2015  CLINICAL DATA:  Colorectal carcinoma diagnosis 2015. Additional lesions in the liver lungs. Chemotherapy ongoing. EXAM: CT CHEST, ABDOMEN, AND PELVIS WITH CONTRAST TECHNIQUE: Multidetector CT imaging of the chest, abdomen and pelvis was performed following the standard protocol during bolus administration of intravenous contrast. CONTRAST:  165m OMNIPAQUE IOHEXOL 300 MG/ML  SOLN COMPARISON:  CT 04/02/2015 FINDINGS: CT CHEST FINDINGS Mediastinum/Nodes: No axillary or supraclavicular lymphadenopathy. Subcarinal lymph node measures 10 mm short axis compared to 14 mm on prior remeasured. No hilar lymphadenopathy. No pericardial fluid esophagus normal. Lungs/Pleura: Bilateral pulmonary nodules and masses are not significant changed from prior. For example RIGHT upper lobe nodule closer 17 mm (image 11, series 6) compared to 17 mm. RIGHT upper lobe nodule measures 22 mm (image 16) compared to 24 mm. RIGHT lower lobe mass measures 41 x 43 mm compared to 41 x 47 mm. LEFT lower lobe nodule measures 12 mm compared to 11 mm (image 21, series 6). Musculoskeletal: No aggressive osseous lesion. CT ABDOMEN AND PELVIS FINDINGS Hepatobiliary: Interval progression of hepatic mass metastasis with increase in the subtle hypodense lesions in liver which are now represented by a large round lesions. For example 20 mm lesion in the RIGHT hepatic lobe on image 31, series 2. 17 mm lesion the RIGHT hepatic lobe on image 35). 50 mm  lesion the lower aspect of the RIGHT hepatic lobe on image 64. Pancreas: Pancreas is normal. No ductal dilatation. No pancreatic inflammation. Spleen: Normal spleen Adrenals/urinary tract: Adrenal glands and kidneys are normal. The ureters and bladder normal. Stomach/Bowel: Stomach, small bowel, appendix, and cecum are normal. The colon and rectosigmoid colon are normal. Vascular/Lymphatic: Abdominal aorta is normal caliber. There is no retroperitoneal or periportal lymphadenopathy. No pelvic lymphadenopathy. Mesenteric lymph nodes described on comparison exam are similar. Example 9 mm lymph node in the sigmoid mesial colon image 85, series 2. Reproductive: Stable uterus Other: No free fluid. Musculoskeletal: No aggressive osseous lesion. IMPRESSION: Chest Impression: 1. Stable metastatic bilateral pulmonary nodules and masses. 2. Stable to mildly decreased mediastinal adenopathy. Abdomen / Pelvis Impression: 1. Multiple new hepatic metastasis in  the RIGHT hepatic lobe corresponds to subtle hypodensities identified on comparison exam. 2. Small mesenteric lymph nodes along the sigmoid colon are unchanged. Electronically Signed   By: Suzy Bouchard M.D.   On: 09/03/2015 18:14     PATHOLOGY:    ASSESSMENT AND PLAN:  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 Stage IV Colon Cancer, KRAS-mutated, now with progression of disease on CT CAP from 09/03/2015 after being on XELIRI from 04/24/2015- 08/07/2015 resulting in a change in therapy to Stivarga 160 mg PO days 1-21 every 28 days beginning on 09/24/2015.  Oncology history is updated.  Labs today: CBC diff, CMET  In an attempt to continue the conversation regarding goals-of-care, I have provided her a pamphlet regarding HCPOA and Living Will and i have asked her to consider filling out this paper work with the assistance of the resources available to her at [facility].  I will reach out to [name], chaplain, in the future if she is agreeable to pursing these  two issues.  CODE STATUS will need to be re-addressed in the future.   She has an appointment with Dr. Merlene Laughter, neurologist, for her headaches and her findings on her MRI of brain. Dr. Freddie Apley office has had a difficult time making contact with the patient. Her appointment is March 20. Acasia will contact Dr. Freddie Apley office regarding this appointment and any financial issues that may impact her.  Her noncompliance is discouraged.  Dexilant escribed  Labs in 2 weeks: CBC diff, CMET, CEA  Return in 2 weeks for follow-up.    THERAPY PLAN:  Sal[place]age Stivarga beginning on 09/24/2015.  All questions were answered. The patient knows to call the clinic with any problems, questions or concerns. We can certainly see the patient much sooner if necessary.  Patient and plan discussed with Dr. Ancil Linsey and she is in agreement with the aforementioned.   This note is electronically signed by: Doy Mince 10/01/2015 2:26 PM

## 2015-10-01 ENCOUNTER — Encounter (HOSPITAL_BASED_OUTPATIENT_CLINIC_OR_DEPARTMENT_OTHER): Payer: Medicaid Other | Admitting: Oncology

## 2015-10-01 ENCOUNTER — Encounter (HOSPITAL_COMMUNITY): Payer: Self-pay | Admitting: Oncology

## 2015-10-01 ENCOUNTER — Encounter (HOSPITAL_COMMUNITY): Payer: Medicaid Other

## 2015-10-01 VITALS — BP 150/93 | HR 84 | Temp 97.9°F | Resp 18 | Wt 250.2 lb

## 2015-10-01 DIAGNOSIS — C7802 Secondary malignant neoplasm of left lung: Secondary | ICD-10-CM | POA: Diagnosis not present

## 2015-10-01 DIAGNOSIS — C7801 Secondary malignant neoplasm of right lung: Secondary | ICD-10-CM | POA: Diagnosis not present

## 2015-10-01 DIAGNOSIS — C189 Malignant neoplasm of colon, unspecified: Secondary | ICD-10-CM

## 2015-10-01 DIAGNOSIS — C787 Secondary malignant neoplasm of liver and intrahepatic bile duct: Secondary | ICD-10-CM | POA: Diagnosis not present

## 2015-10-01 DIAGNOSIS — C7800 Secondary malignant neoplasm of unspecified lung: Principal | ICD-10-CM

## 2015-10-01 DIAGNOSIS — K219 Gastro-esophageal reflux disease without esophagitis: Secondary | ICD-10-CM

## 2015-10-01 LAB — COMPREHENSIVE METABOLIC PANEL
ALBUMIN: 3.6 g/dL (ref 3.5–5.0)
ALK PHOS: 95 U/L (ref 38–126)
ALT: 21 U/L (ref 14–54)
AST: 24 U/L (ref 15–41)
Anion gap: 10 (ref 5–15)
BILIRUBIN TOTAL: 0.5 mg/dL (ref 0.3–1.2)
BUN: 9 mg/dL (ref 6–20)
CALCIUM: 9.2 mg/dL (ref 8.9–10.3)
CO2: 22 mmol/L (ref 22–32)
CREATININE: 0.67 mg/dL (ref 0.44–1.00)
Chloride: 104 mmol/L (ref 101–111)
GFR calc Af Amer: >60 mL/min (ref >60)
GLUCOSE: 220 mg/dL — AB (ref 65–99)
Potassium: 3.9 mmol/L (ref 3.5–5.1)
Sodium: 136 mmol/L (ref 135–145)
TOTAL PROTEIN: 8.1 g/dL (ref 6.5–8.1)

## 2015-10-01 LAB — CBC WITH DIFFERENTIAL/PLATELET
Basophils Absolute: 0 10*3/uL (ref 0.0–0.1)
Basophils Relative: 0 %
Eosinophils Absolute: 0.5 10*3/uL (ref 0.0–0.7)
Eosinophils Relative: 4 %
HEMATOCRIT: 40.9 % (ref 36.0–46.0)
HEMOGLOBIN: 13.8 g/dL (ref 12.0–15.0)
LYMPHS PCT: 27 %
Lymphs Abs: 3.3 10*3/uL (ref 0.7–4.0)
MCH: 29.9 pg (ref 26.0–34.0)
MCHC: 33.7 g/dL (ref 30.0–36.0)
MCV: 88.5 fL (ref 78.0–100.0)
Monocytes Absolute: 0.8 10*3/uL (ref 0.1–1.0)
Monocytes Relative: 6 %
NEUTROS ABS: 7.5 10*3/uL (ref 1.7–7.7)
NEUTROS PCT: 63 %
Platelets: 258 10*3/uL (ref 150–400)
RBC: 4.62 MIL/uL (ref 3.87–5.11)
RDW: 13.6 % (ref 11.5–15.5)
WBC: 12 10*3/uL — ABNORMAL HIGH (ref 4.0–10.5)

## 2015-10-01 MED ORDER — DEXLANSOPRAZOLE 60 MG PO CPDR: 60.0000 mg | DELAYED_RELEASE_CAPSULE | Freq: Every day | ORAL | Status: AC

## 2015-10-01 NOTE — Patient Instructions (Addendum)
..  [place] at [facility] Discharge Instructions  RECOMMENDATIONS MADE BY THE CONSULTANT AND ANY TEST RESULTS WILL BE SENT TO YOUR REFERRING PHYSICIAN.  Exam per Caroleen Hamman PA-c Return on April 5th  To see Dr. Whitney Muse and port flush with labs Continue Stivarga  Thank you for choosing [place] at [facility] to provide your oncology and hematology care.  To afford each patient quality time with our provider, please arrive at least 15 minutes before your scheduled appointment time.   Beginning January 23rd 2017 lab work for the [name] will be done in the  Main lab at [facility] on 1st floor. If you have a lab appointment with the [place] please come in thru the  Main Entrance and check in at the main information desk  You need to re-schedule your appointment should you arrive 10 or more minutes late.  We strive to give you quality time with our providers, and arriving late affects you and other patients whose appointments are after yours.  Also, if you no show three or more times for appointments you may be dismissed from the clinic at the providers discretion.     Again, thank you for choosing [facility].  Our hope is that these requests will decrease the amount of time that you wait before being seen by our physicians.       _____________________________________________________________  Should you have questions after your visit to [facility], please contact our office at [phone] between the hours of 8:30 a.m. and 4:30 p.m.  Voicemails left after 4:30 p.m. will not be returned until the following business day.  For prescription refill requests, have your pharmacy contact our office.         Resources For Cancer Patients and their Caregivers ? American Cancer Society: Can assist with transportation, wigs, general needs, runs Look Good Feel Better.        (518) 826-1954 ? Cancer  Care: Provides financial assistance, online support groups, medication/co-pay assistance.  1-800-813-HOPE 248-598-0754) ? [place] Assists [place] cancer patients and their families through emotional , educational and financial support.  442 271 3810 ? [place] DSS Where to apply for food stamps, Medicaid and utility assistance. 806-514-6742 ? [facility]: Transportation to medical appointments. 534-828-7926 ? Social Security Administration: May apply for disability if have a Stage IV cancer. (343)732-2209 570 415 2071 ? [employer], Disability and Transit Services: Assists with nutrition, care and transit needs. (309) 015-8033

## 2015-10-03 ENCOUNTER — Encounter (HOSPITAL_COMMUNITY): Payer: Self-pay | Admitting: *Deleted

## 2015-10-03 ENCOUNTER — Other Ambulatory Visit (HOSPITAL_COMMUNITY): Payer: Self-pay | Admitting: *Deleted

## 2015-10-03 DIAGNOSIS — C7800 Secondary malignant neoplasm of unspecified lung: Principal | ICD-10-CM

## 2015-10-03 DIAGNOSIS — C189 Malignant neoplasm of colon, unspecified: Secondary | ICD-10-CM

## 2015-10-03 MED ORDER — SUCRALFATE 1 GM/10ML PO SUSP: 1.0000 g | Freq: Three times a day (TID) | ORAL | Status: DC

## 2015-10-04 ENCOUNTER — Other Ambulatory Visit (HOSPITAL_COMMUNITY): Payer: Self-pay | Admitting: Respiratory Therapy

## 2015-10-04 DIAGNOSIS — R413 Other amnesia: Secondary | ICD-10-CM

## 2015-10-04 DIAGNOSIS — G4459 Other complicated headache syndrome: Secondary | ICD-10-CM

## 2015-10-04 DIAGNOSIS — G4733 Obstructive sleep apnea (adult) (pediatric): Secondary | ICD-10-CM

## 2015-10-04 DIAGNOSIS — I10 Essential (primary) hypertension: Secondary | ICD-10-CM

## 2015-10-10 ENCOUNTER — Other Ambulatory Visit (HOSPITAL_COMMUNITY): Payer: Self-pay | Admitting: Oncology

## 2015-10-10 DIAGNOSIS — C7800 Secondary malignant neoplasm of unspecified lung: Principal | ICD-10-CM

## 2015-10-10 DIAGNOSIS — C189 Malignant neoplasm of colon, unspecified: Secondary | ICD-10-CM

## 2015-10-10 MED ORDER — REGORAFENIB 40 MG PO TABS: 160.0000 mg | ORAL_TABLET | Freq: Every day | ORAL | Status: DC

## 2015-10-12 ENCOUNTER — Other Ambulatory Visit (HOSPITAL_COMMUNITY): Payer: Self-pay | Admitting: *Deleted

## 2015-10-12 DIAGNOSIS — C189 Malignant neoplasm of colon, unspecified: Secondary | ICD-10-CM

## 2015-10-12 DIAGNOSIS — C7800 Secondary malignant neoplasm of unspecified lung: Principal | ICD-10-CM

## 2015-10-12 DIAGNOSIS — R918 Other nonspecific abnormal finding of lung field: Secondary | ICD-10-CM

## 2015-10-12 MED ORDER — OXYCODONE-ACETAMINOPHEN 5-325 MG PO TABS: 1.0000 | ORAL_TABLET | ORAL | Status: DC | PRN

## 2015-10-17 ENCOUNTER — Encounter (HOSPITAL_BASED_OUTPATIENT_CLINIC_OR_DEPARTMENT_OTHER)

## 2015-10-17 ENCOUNTER — Encounter (HOSPITAL_COMMUNITY): Payer: Self-pay | Admitting: Hematology & Oncology

## 2015-10-17 ENCOUNTER — Encounter (HOSPITAL_COMMUNITY): Attending: Hematology & Oncology | Admitting: Hematology & Oncology

## 2015-10-17 VITALS — BP 137/97 | HR 78 | Temp 97.9°F | Resp 18 | Wt 249.0 lb

## 2015-10-17 DIAGNOSIS — C7951 Secondary malignant neoplasm of bone: Secondary | ICD-10-CM | POA: Diagnosis not present

## 2015-10-17 DIAGNOSIS — C787 Secondary malignant neoplasm of liver and intrahepatic bile duct: Secondary | ICD-10-CM | POA: Diagnosis not present

## 2015-10-17 DIAGNOSIS — R112 Nausea with vomiting, unspecified: Secondary | ICD-10-CM | POA: Diagnosis not present

## 2015-10-17 DIAGNOSIS — C189 Malignant neoplasm of colon, unspecified: Secondary | ICD-10-CM

## 2015-10-17 DIAGNOSIS — I82621 Acute embolism and thrombosis of deep veins of right upper extremity: Secondary | ICD-10-CM | POA: Diagnosis not present

## 2015-10-17 DIAGNOSIS — C19 Malignant neoplasm of rectosigmoid junction: Secondary | ICD-10-CM | POA: Insufficient documentation

## 2015-10-17 DIAGNOSIS — C7800 Secondary malignant neoplasm of unspecified lung: Secondary | ICD-10-CM

## 2015-10-17 DIAGNOSIS — R51 Headache: Secondary | ICD-10-CM | POA: Insufficient documentation

## 2015-10-17 LAB — COMPREHENSIVE METABOLIC PANEL
ALK PHOS: 93 U/L (ref 38–126)
ALT: 19 U/L (ref 14–54)
AST: 18 U/L (ref 15–41)
Albumin: 3.6 g/dL (ref 3.5–5.0)
Anion gap: 9 (ref 5–15)
BILIRUBIN TOTAL: 0.4 mg/dL (ref 0.3–1.2)
BUN: 9 mg/dL (ref 6–20)
CALCIUM: 8.9 mg/dL (ref 8.9–10.3)
CO2: 25 mmol/L (ref 22–32)
CREATININE: 0.61 mg/dL (ref 0.44–1.00)
Chloride: 103 mmol/L (ref 101–111)
GFR calc Af Amer: >60 mL/min (ref >60)
GFR calc non Af Amer: >60 mL/min (ref >60)
GLUCOSE: 137 mg/dL — AB (ref 65–99)
Potassium: 3.7 mmol/L (ref 3.5–5.1)
SODIUM: 137 mmol/L (ref 135–145)
TOTAL PROTEIN: 7.7 g/dL (ref 6.5–8.1)

## 2015-10-17 LAB — CBC WITH DIFFERENTIAL/PLATELET
Basophils Absolute: 0 10*3/uL (ref 0.0–0.1)
Basophils Relative: 0 %
EOS ABS: 0.4 10*3/uL (ref 0.0–0.7)
Eosinophils Relative: 3 %
HEMATOCRIT: 41.5 % (ref 36.0–46.0)
HEMOGLOBIN: 13.6 g/dL (ref 12.0–15.0)
LYMPHS ABS: 4.2 10*3/uL — AB (ref 0.7–4.0)
Lymphocytes Relative: 34 %
MCH: 29.1 pg (ref 26.0–34.0)
MCHC: 32.8 g/dL (ref 30.0–36.0)
MCV: 88.9 fL (ref 78.0–100.0)
MONO ABS: 0.8 10*3/uL (ref 0.1–1.0)
MONOS PCT: 6 %
NEUTROS PCT: 57 %
Neutro Abs: 7.1 10*3/uL (ref 1.7–7.7)
Platelets: 276 10*3/uL (ref 150–400)
RBC: 4.67 MIL/uL (ref 3.87–5.11)
RDW: 14.4 % (ref 11.5–15.5)
WBC: 12.5 10*3/uL — ABNORMAL HIGH (ref 4.0–10.5)

## 2015-10-17 MED ORDER — HEPARIN SOD (PORK) LOCK FLUSH 100 UNIT/ML IV SOLN
500.0000 [IU] | Freq: Once | INTRAVENOUS | Status: AC
  Administered 2015-10-17: 500 [IU] via INTRAVENOUS

## 2015-10-17 MED ORDER — HEPARIN SOD (PORK) LOCK FLUSH 100 UNIT/ML IV SOLN
INTRAVENOUS | Status: AC
  Filled 2015-10-17: qty 5

## 2015-10-17 MED ORDER — SODIUM CHLORIDE 0.9% FLUSH
20.0000 mL | INTRAVENOUS | Status: DC | PRN
Start: 2015-10-17 — End: 2015-10-17
  Administered 2015-10-17: 20 mL via INTRAVENOUS
  Filled 2015-10-17: qty 20

## 2015-10-17 NOTE — Progress Notes (Signed)
[facility] Progress Note  Patient Care Team: No Pcp Per Patient as PCP - General ([place]) Melrose Nakayama, MD as Consulting Physician (Cardiothoracic Surgery)  CHIEF COMPLAINTS/PURPOSE OF CONSULTATION:  Stage IV CRC CT of abdomen and pelvis on 02/20/2014 at [facility] showing 3.6 cm apple core lesion in the proximal sigmoid colon 11 mm hypoenhancing lesion in the medial left hepatic dome, 4.1 cm right lower lobe mass and adjacent right lower lobe nodule CEA on 08/18/2014 of 93.7 ng/ml Right upper extremity DVT 04/17/2014 paired brachial veins, axillary vein, and peripheral aspect of the right subclavian vein CT chest 07/06/2014 with bilateral pulmonary nodules and masses, RUL, lateral RUL, posterior LUL, lobulated nodule in the subpleural RLL,     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 ([place])   02/20/2014 Imaging CT of abdomen and pelvis on 02/20/2014 at [facility] showing 3.6 cm apple core lesion in the proximal sigmoid colon 11 mm hypoenhancing lesion in the medial left hepatic dome, 4.1 cm right lower lobe mass and adjacent right lower lobe nodule   02/23/2014 Pathology Results KRAS MUTATED   04/17/2014 Imaging Right upper extremity DVT 04/17/2014 paired brachial veins, axillary vein, and peripheral aspect of the right subclavian vein   07/06/2014 Imaging CT chest 07/06/2014 with bilateral pulmonary nodules and masses, RUL, lateral RUL, posterior LUL, lobulated nodule in the subpleural RLL   08/18/2014 Tumor Marker CEA 93.7 ng/ml   10/09/2014 Initial Diagnosis 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   02/19/2015 Imaging Progressive pulmonary metastatic disease as detailed above. Stable mediastinal lymphadenopathy. No abdominal/pelvic metastatic disease is identified   04/24/2015 - 08/07/2015 Chemotherapy XELIRI started on  04/24/2015. Patient did not want to wear 5-FU pump.  On 2000 mg BID 14 days on and 7 days off.   04/28/2015 Adverse Reaction Mild stomatitis.  Xeloda held.  Treated with Magic Mouthwash.  Resolved by 05/02/2015 at office visit.   05/15/2015 Treatment Plan Change Will restart Xeloda at 1500 mg BID 14 days on and 7 days off (11/1)   07/17/2015 Adverse Reaction Mouth sores   07/17/2015 Treatment Plan Change Change in Xeloda frequency: 1500 mg BID 7 days on and 7 days off.   07/24/2015 Adverse Reaction Increased nausea/vomiting   08/07/2015 Treatment Plan Change Irinotecan dose reduced by 10%   08/10/2015 Imaging MRI brain- Scattered punctate subcortical T2 hyperintensities bilaterally are slightly greater than expected for age. The finding is nonspecific but can be seen in the setting of chronic microvascular ischemia, a demyelinating process such as multiple...   09/03/2015 Progression CT CAP- demonstrates new hepatic lesions   09/03/2015 Imaging CT CAP- Multiple new hepatic metastasis in the R hepatic lobe corresponds to subtle hypodensities identified on comparison exam.  Stable metastatic bilateral pulmonary nodules and masses. Small mesenteric lymph nodes along the sigmoid colon are unchanged   09/24/2015 -  Chemotherapy Stivarga 160 mg daily 21/28 days    HISTORY OF PRESENTING ILLNESS:   Erica Barker 55 y.o. female is here because of stage IV colon cancer. She has progressive disease. Unfortunately is kras mutated and MSI stable.   Erica Barker was here alone today.  She has been on Stivarga for 3 weeks now. She has had no problems with this other than some constipation and that her feet are peeling. She uses some lotion for this. She has had no soreness or redness on  her hands or feet, no mouth sores and no problems eating. She has lost one pound since her last visit and is excited about this. She is trying to lose weight and has been walking, using the treadmill and eating different things.  Appetite is okay, she is cutting back on certain stuff.   She is going to a headache doctor in [place] soon. She thought that she was having headaches because her Xarelto and her blood pressure medication "don't like each other" She started taking her Xarelto in the morning and her blood pressure medication at night to try to prevent her headaches but she still had them. She said when she gets them "it feels like my head was going to pop off"  She said that she sometimes gets blood in her nose. She occasionally has blood tinged mucus but most of the time it feels like she has a sore in her nose.  Her breathing is okay.  She does not need any prescription refills and she just got her pain medication refilled.   She goes on a cruise the 15th. She will return for a follow up the week after that.   MEDICAL HISTORY:  Past Medical History  Diagnosis Date  . Hypertension   . Depression   . Pulmonary nodules 01/30/14    CTA CHEST  . Lymphadenopathy, mediastinal 01/2014    CT[place]IO [place]  . Morbid obesity ([place])   . Diabetes mellitus, type II ([place])   . Headache(784.0)   . Medically noncompliant 09/06/2015    SURGICAL HISTORY: Past Surgical History  Procedure Laterality Date  . Cholecystectomy    . Video bronchoscopy with endobronchial ultrasound N/A 02/06/2014    Procedure: VIDEO BRONCHOSCOPY WITH ENDOBRONCHIAL ULTRASOUND,bronchial biopsies , node sampling;  Surgeon: Melrose Nakayama, MD;  Location: [place];  Service: Thoracic;  Laterality: N/A;  . Tubal ligation      SOCIAL HISTORY: Social History   Social History  . Marital Status: Widowed    Spouse Name: N/A  . Number of Children: N/A  . Years of Education: N/A   Occupational History  . Not on file.   Social History Main Topics  . Smoking status: Former Smoker -- 0.50 packs/day for 7 years    Types: Cigarettes    Quit date: 02/03/1989  . Smokeless tobacco: Never Used  . Alcohol Use: Yes     Comment:  occasional  . Drug Use: No  . Sexual Activity: Not on file   Other Topics Concern  . Not on file   Social History Narrative  She is in a relationship. 3 children. Aged 32,35,20.  She has no grandchildren. She smoked in high school but quit after graduation. No ETOH. 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  She is originally from [place].  FAMILY HISTORY: Family History  Problem Relation Age of Onset  . Cancer Mother     UTERINE  . Cancer Father     LEUKEMIA  . Crohn's disease Son    indicated that her mother is alive. She indicated that her father is deceased. She indicated that her son is alive.   Father is deceased at age 70 from leukemia, mother is alive at 10 with a history of uterine cancer.  She has one sister how is healthy and one brother who is healthy. He works as a Engineer, structural.   ALLERGIES:  is allergic to asa.  MEDICATIONS:  Current  Outpatient Prescriptions  Medication Sig Dispense Refill  . aspirin-acetaminophen-caffeine (EXCEDRIN MIGRAINE) 250-250-65 MG per tablet Take 2 tablets by mouth every 6 (six) hours as needed for headache.    . dexlansoprazole (DEXILANT) 60 MG capsule Take 1 capsule (60 mg total) by mouth daily. 30 capsule 5  . escitalopram (LEXAPRO) 20 MG tablet Take 1/2 tablet by mouth daily for 5 days then increase to one tablet daily 30 tablet 6  . lisinopril (PRINIVIL,ZESTRIL) 10 MG tablet Take 1 tablet (10 mg total) by mouth daily. 30 tablet 6  . metFORMIN (GLUCOPHAGE) 500 MG tablet Take 1 tablet (500 mg total) by mouth daily with breakfast. 30 tablet 3  . Multiple Vitamin (MULTIVITAMIN) capsule Take 1 capsule by mouth daily.    Marland Kitchen oxyCODONE-acetaminophen (PERCOCET/ROXICET) 5-325 MG tablet Take 1 tablet by mouth every 4 (four) hours as needed for severe pain. 60 tablet 0  . promethazine (PHENERGAN) 25 MG suppository Place 1 suppository (25 mg total) rectally every 6 (six) hours as needed for nausea or vomiting.  12 each 0  . regorafenib (STIVARGA) 40 MG tablet Take 4 tablets (160 mg total) by mouth daily with breakfast. Take with low fat meal. Caution: Chemotherapy. 84 tablet 0  . rivaroxaban (XARELTO) 20 MG TABS tablet Take 1 tablet (20 mg total) by mouth daily with supper. 30 tablet 3  . sucralfate (CARAFATE) 1 GM/10ML suspension Take 10 mLs (1 g total) by mouth 4 (four) times daily -  with meals and at bedtime. 420 mL 1  . Diphenhyd-Hydrocort-Nystatin (FIRST-DUKES MOUTHWASH) SUSP Use as directed 5 mLs in the mouth or throat 4 (four) times daily as needed. (Patient not taking: Reported on 10/01/2015) 300 mL 2  . diphenoxylate-atropine (LOMOTIL) 2.5-0.025 MG per tablet Take 1 tablet by mouth every 4 (four) hours as needed for diarrhea or loose stools. (Patient not taking: Reported on 10/01/2015) 45 tablet 0  . loperamide (IMODIUM) 2 MG capsule Take 2 caps after 1st loose stool then 1 cap every 2 hours until you go 12 hours without having a loose stool. At bedtime for diarrhea, take 2 caps then 2 caps every 4 hours until morning. (Patient not taking: Reported on 10/01/2015) 60 capsule 0  . magic mouthwash SOLN Swish and swallow 24m by mouth every 4 hours as needed for mouth pain. (Patient not taking: Reported on 10/01/2015) 360 mL 3   No current facility-administered medications for this visit.   Facility-Administered Medications Ordered in Other Visits  Medication Dose Route Frequency Provider Last Rate Last Dose  . heparin lock flush 100 unit/mL  500 Units Intravenous Once TAmeren Corporation PA-C      . sodium chloride flush (NS) 0.9 % injection 20 mL  20 mL Intravenous PRN TBaird Cancer PA-C        Review of Systems  Constitutional: Negative for fever, chills, weight loss and malaise/fatigue.  HENT: Positive for headaches and minor nose bleeds Negative for congestion, hearing loss, nosebleeds, sore throat and tinnitus.  Described as migraines that occur every 3-4 days, alleviated with pain medication.  Nosebleeds occasionally, feels like a sore in her nose.  Eyes: Negative for blurred vision, double vision, pain and discharge.  Respiratory: Negative for cough, hemoptysis, sputum production, shortness of breath and wheezing.   Cardiovascular: Negative for chest pain, palpitations, claudication, leg swelling and PND.  Gastrointestinal: Positive for constipation. Negative for nausea, vomiting, abdominal pain, diarrhea, constipation, heartburn, blood in stool and melena.  Constipation due to Stivarga medication. Genitourinary: Negative for  dysuria, urgency, frequency and hematuria.  Musculoskeletal: Negative for myalgias, joint pain and falls.  Skin:  Positive for feet peeling, mild. Negative for rash and skin changes Feet peeling due to Stivarga medication. Uses lotion to alleviate.  Neurological: Negative for dizziness, tingling, tremors, sensory change, speech change, focal weakness, seizures, loss of consciousness, weakness. Endo/Heme/Allergies: Does not bruise/bleed easily.  Psychiatric/Behavioral: Negative for depression, suicidal ideas, memory loss and substance abuse. The patient is not nervous/anxious and does not have insomnia.   14 point review of systems was performed and is negative except as detailed under history of present illness and above  PHYSICAL EXAMINATION: ECOG PERFORMANCE STATUS: 1 - Symptomatic but completely ambulatory  Filed Vitals:   10/17/15 1000  BP: 137/97  Pulse: 78  Temp: 97.9 F (36.6 C)  Resp: 18   Filed Weights   10/17/15 1000  Weight: 249 lb (112.946 kg)    Physical Exam  Constitutional: She is oriented to person, place, and time and well-developed, well-nourished, and in no distress.  HENT:  Head: Normocephalic and atraumatic.  Nose: Nose normal.  Mouth/Throat: Oropharynx is clear and moist. No oropharyngeal exudate.  Eyes: Conjunctivae and EOM are normal. Pupils are equal, round, and reactive to light. Right eye exhibits no discharge. Left eye  exhibits no discharge. No scleral icterus.  Neck: Normal range of motion. Neck supple. No tracheal deviation present. No thyromegaly present.  Cardiovascular: Normal rate, regular rhythm and normal heart sounds.  Exam reveals no gallop and no friction rub.  No murmur heard. Pulmonary/Chest: Effort normal and breath sounds normal. She has no wheezes. She has no rales.  Abdominal: Soft. Bowel sounds are normal. She exhibits no distension and no mass. There is no tenderness. There is no rebound and no guarding.  Musculoskeletal: Normal range of motion. She exhibits no edema.  Lymphadenopathy:    She has no cervical adenopathy.  Neurological: She is alert and oriented to person, place, and time. She has normal reflexes. No cranial nerve deficit. Gait normal. Coordination normal.  Skin: Skin is warm and dry. No rash noted.Mild peeling of skin on bilateral feet  Psychiatric: Mood, memory, affect and judgment normal.  Nursing note and vitals reviewed.   LABORATORY DATA:  I have reviewed the data as listed.  CBC    Component Value Date/Time   WBC 12.0* 10/01/2015 0848   RBC 4.62 10/01/2015 0848   RBC 4.36 11/16/2014 1214   HGB 13.8 10/01/2015 0848   HCT 40.9 10/01/2015 0848   PLT 258 10/01/2015 0848   MCV 88.5 10/01/2015 0848   MCH 29.9 10/01/2015 0848   MCHC 33.7 10/01/2015 0848   RDW 13.6 10/01/2015 0848   LYMPHSABS 3.3 10/01/2015 0848   MONOABS 0.8 10/01/2015 0848   EOSABS 0.5 10/01/2015 0848   BASOSABS 0.0 10/01/2015 0848    CMP     Component Value Date/Time   NA 136 10/01/2015 0848   K 3.9 10/01/2015 0848   CL 104 10/01/2015 0848   CO2 22 10/01/2015 0848   GLUCOSE 220* 10/01/2015 0848   BUN 9 10/01/2015 0848   CREATININE 0.67 10/01/2015 0848   CALCIUM 9.2 10/01/2015 0848   PROT 8.1 10/01/2015 0848   ALBUMIN 3.6 10/01/2015 0848   AST 24 10/01/2015 0848   ALT 21 10/01/2015 0848   ALKPHOS 95 10/01/2015 0848   BILITOT 0.5 10/01/2015 0848   GFRNONAA >60 10/01/2015 0848    GFRAA >60 10/01/2015 0848    RADIOGRAPHIC STUDIES I have personally reviewed the radiological  images as listed and agreed with the findings in the report.  Study Result     CLINICAL DATA: Colorectal carcinoma diagnosis 2015. Additional lesions in the liver lungs. Chemotherapy ongoing.  EXAM: CT CHEST, ABDOMEN, AND PELVIS WITH CONTRAST  TECHNIQUE: Multidetector CT imaging of the chest, abdomen and pelvis was performed following the standard protocol during bolus administration of intravenous contrast.  CONTRAST: 183m OMNIPAQUE IOHEXOL 300 MG/ML SOLN  COMPARISON: CT 04/02/2015  FINDINGS: CT CHEST FINDINGS  Mediastinum/Nodes: No axillary or supraclavicular lymphadenopathy. Subcarinal lymph node measures 10 mm short axis compared to 14 mm on prior remeasured. No hilar lymphadenopathy. No pericardial fluid esophagus normal.  Lungs/Pleura: Bilateral pulmonary nodules and masses are not significant changed from prior.  For example RIGHT upper lobe nodule closer 17 mm (image 11, series 6) compared to 17 mm.  RIGHT upper lobe nodule measures 22 mm (image 16) compared to 24 mm.  RIGHT lower lobe mass measures 41 x 43 mm compared to 41 x 47 mm.  LEFT lower lobe nodule measures 12 mm compared to 11 mm (image 21, series 6).  Musculoskeletal: No aggressive osseous lesion.  CT ABDOMEN AND PELVIS FINDINGS  Hepatobiliary: Interval progression of hepatic mass metastasis with increase in the subtle hypodense lesions in liver which are now represented by a large round lesions. For example 20 mm lesion in the RIGHT hepatic lobe on image 31, series 2. 17 mm lesion the RIGHT hepatic lobe on image 35). 50 mm lesion the lower aspect of the RIGHT hepatic lobe on image 64.  Pancreas: Pancreas is normal. No ductal dilatation. No pancreatic inflammation.  Spleen: Normal spleen  Adrenals/urinary tract: Adrenal glands and kidneys are normal. The ureters and  bladder normal.  Stomach/Bowel: Stomach, small bowel, appendix, and cecum are normal. The colon and rectosigmoid colon are normal.  Vascular/Lymphatic: Abdominal aorta is normal caliber. There is no retroperitoneal or periportal lymphadenopathy. No pelvic lymphadenopathy.  Mesenteric lymph nodes described on comparison exam are similar. Example 9 mm lymph node in the sigmoid mesial colon image 85, series 2.  Reproductive: Stable uterus  Other: No free fluid.  Musculoskeletal: No aggressive osseous lesion.  IMPRESSION: Chest Impression:  1. Stable metastatic bilateral pulmonary nodules and masses. 2. Stable to mildly decreased mediastinal adenopathy.  Abdomen / Pelvis Impression:  1. Multiple new hepatic metastasis in the RIGHT hepatic lobe corresponds to subtle hypodensities identified on comparison exam. 2. Small mesenteric lymph nodes along the sigmoid colon are unchanged.   Electronically Signed  By: SSuzy BouchardM.D.  On: 09/03/2015 18:14   Study Result     CLINICAL DATA: Severe headaches over the last 6 months. Personal history of colon cancer.  EXAM: MRI HEAD WITHOUT AND WITH CONTRAST  TECHNIQUE: Multiplanar, multiecho pulse sequences of the brain and surrounding structures were obtained without and with intravenous contrast.  CONTRAST: 234mMULTIHANCE GADOBENATE DIMEGLUMINE 529 MG/ML IV SOLN  COMPARISON: CT head without contrast 02/26/2015.  FINDINGS: Scattered punctate subcortical T2 hyperintensities are slightly advanced for age. No acute infarct, hemorrhage, or mass lesion is present. Insert pass ventricles insert pass fluid  Flow is present in the major intracranial arteries. Globes and orbits are intact. A single anterior right ethmoid air cell is opacified.  The internal auditory canal is within normal limits bilaterally. The brainstem and cerebellum are unremarkable.  The postcontrast images demonstrate no  pathologic enhancement.  IMPRESSION: 1. No acute intracranial abnormality or focal lesion to explain headaches. 2. Scattered punctate subcortical T2 hyperintensities bilaterally are slightly greater  than expected for age. The finding is nonspecific but can be seen in the setting of chronic microvascular ischemia, a demyelinating process such as multiple sclerosis, vasculitis, complicated migraine headaches, or as the sequelae of a prior infectious or inflammatory process. These are most likely associated with the patient's headaches.   Electronically Signed  By: San Morelle M.D.  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  At times the patient seems to be very accepting of her disease and prognosis and other times she is very difficult time addressing it. She currently has very good quality of life and is very active.   She has been on Stivarga for 3 weeks now with excellent tolerance.   She still gets occasional debilitating headaches and has been referred to the headache clinic in [place]. She was advised to take her MRI on CD-ROM with her.   She does not need any prescription refills and she just got her pain medication refilled.   She goes on a cruise the 15th. She will return for a follow up the week after that. We need to start looking for clinical trial options for her. In addition we can send Foundation One testing if additional tumor is available, if not perhaps we can re-biospy.   All questions were answered. The patient knows to call the clinic with any problems, questions or concerns.    This document serves as a record of services personally performed by  Ancil Linsey, MD. It was created on her behalf by Kandace Blitz, a trained medical scribe. The creation of this record is based on the scribe's personal observations and the provider's statements to them. This document has been checked and approved by the attending provider.  I have reviewed the above documentation for accuracy and completeness, and I agree with the above.  Kelby Fam. Whitney Muse, MD

## 2015-10-17 NOTE — Patient Instructions (Signed)
[facility] Discharge Instructions  RECOMMENDATIONS MADE BY THE CONSULTANT AND ANY TEST RESULTS WILL BE SENT TO YOUR REFERRING PHYSICIAN.  Port flush today.    Thank you for choosing [facility] at [facility] to provide your oncology and hematology care.  To afford each patient quality time with our provider, please arrive at least 15 minutes before your scheduled appointment time.   Beginning January 23rd 2017 lab work for the Cancer Center will be done in the  Main lab at [facility] on 1st floor. If you have a lab appointment with the Cancer Center please come in thru the  Main Entrance and check in at the main information desk  You need to re-schedule your appointment should you arrive 10 or more minutes late.  We strive to give you quality time with our providers, and arriving late affects you and other patients whose appointments are after yours.  Also, if you no show three or more times for appointments you may be dismissed from the clinic at the providers discretion.     Again, thank you for choosing [facility].  Our hope is that these requests will decrease the amount of time that you wait before being seen by our physicians.       _____________________________________________________________  Should you have questions after your visit to [facility], please contact our office at [phone] between the hours of 8:30 a.m. and 4:30 p.m.  Voicemails left after 4:30 p.m. will not be returned until the following business day.  For prescription refill requests, have your pharmacy contact our office.         Resources For Cancer Patients and their Caregivers ? American Cancer Society: Can assist with transportation, wigs, general needs, runs Look Good Feel Better.        1-888-227-6333 ? Cancer Care: Provides financial assistance, online support groups, medication/co-pay assistance.   1-800-813-HOPE (4673) ? [facility] Assists [place] cancer patients and their families through emotional , educational and financial support.  336-427-4357 ? [place] DSS Where to apply for food stamps, Medicaid and utility assistance. 336-342-1394 ? [facility]: Transportation to medical appointments. 336-347-2287 ? Social Security Administration: May apply for disability if have a Stage IV cancer. 336-342-7796 1-800-772-1213 ? [place] Aging, Disability and Transit Services: Assists with nutrition, care and transit needs. 336-349-2343  

## 2015-10-17 NOTE — Progress Notes (Signed)
Erica Barker presented for Portacath access and flush. Proper placement of portacath confirmed by CXR. Portacath located right chest wall accessed with  H 20 needle. Good blood return present.  Specimen drawn for labs.   Portacath flushed with 65m NS and 500U/524mHeparin and needle removed intact. Procedure without incident. Patient tolerated procedure well.

## 2015-10-17 NOTE — Patient Instructions (Signed)
[place] at [facility] Discharge Instructions  RECOMMENDATIONS MADE BY THE CONSULTANT AND ANY TEST RESULTS WILL BE SENT TO YOUR REFERRING PHYSICIAN.   Exam and discussion by Dr Whitney Muse today MRI-just stop by radiology to get your disk. Return to see the doctor the week after your trip  Port flush that day with labs  Please call the clinic if you have any questions or concerns     Thank you for choosing [place] at [facility] to provide your oncology and hematology care.  To afford each patient quality time with our provider, please arrive at least 15 minutes before your scheduled appointment time.   Beginning January 23rd 2017 lab work for the [name] will be done in the  Main lab at [facility] on 1st floor. If you have a lab appointment with the [place] please come in thru the  Main Entrance and check in at the main information desk  You need to re-schedule your appointment should you arrive 10 or more minutes late.  We strive to give you quality time with our providers, and arriving late affects you and other patients whose appointments are after yours.  Also, if you no show three or more times for appointments you may be dismissed from the clinic at the providers discretion.     Again, thank you for choosing [facility].  Our hope is that these requests will decrease the amount of time that you wait before being seen by our physicians.       _____________________________________________________________  Should you have questions after your visit to [facility], please contact our office at [phone] between the hours of 8:30 a.m. and 4:30 p.m.  Voicemails left after 4:30 p.m. will not be returned until the following business day.  For prescription refill requests, have your pharmacy contact our office.         Resources For Cancer Patients and their Caregivers ? American Cancer  Society: Can assist with transportation, wigs, general needs, runs Look Good Feel Better.        (416)166-0512 ? Cancer Care: Provides financial assistance, online support groups, medication/co-pay assistance.  1-800-813-HOPE 914-866-0773) ? [place] Assists [place] cancer patients and their families through emotional , educational and financial support.  718 404 3446 ? [place] DSS Where to apply for food stamps, Medicaid and utility assistance. 406-821-8923 ? [facility]: Transportation to medical appointments. 910 608 4895 ? Social Security Administration: May apply for disability if have a Stage IV cancer. 971 211 4332 262-813-8725 ? [employer], Disability and Transit Services: Assists with nutrition, care and transit needs. 828-269-1148

## 2015-10-18 LAB — CEA: CEA: 303.7 ng/mL — ABNORMAL HIGH (ref 0.0–4.7)

## 2015-10-22 ENCOUNTER — Encounter (HOSPITAL_COMMUNITY)

## 2015-10-23 ENCOUNTER — Encounter: Payer: Self-pay | Admitting: *Deleted

## 2015-10-23 NOTE — Progress Notes (Signed)
[place] Clinical Social Work  Pt attended [employer] group today. Pt continues to do well in processing her illness and is planning to start volunteering at [facility].   Clinical Social Work interventions: Support group   Loren Racer, [place] Tuesdays   [phone]

## 2015-11-01 ENCOUNTER — Other Ambulatory Visit (HOSPITAL_COMMUNITY): Payer: Self-pay | Admitting: Respiratory Therapy

## 2015-11-01 DIAGNOSIS — G4733 Obstructive sleep apnea (adult) (pediatric): Secondary | ICD-10-CM

## 2015-11-09 ENCOUNTER — Encounter (HOSPITAL_COMMUNITY)

## 2015-11-09 ENCOUNTER — Encounter (HOSPITAL_BASED_OUTPATIENT_CLINIC_OR_DEPARTMENT_OTHER): Admitting: Hematology & Oncology

## 2015-11-09 ENCOUNTER — Encounter (HOSPITAL_COMMUNITY): Payer: Self-pay | Admitting: Hematology & Oncology

## 2015-11-09 VITALS — BP 124/84 | HR 92 | Temp 98.8°F | Resp 16 | Wt 247.3 lb

## 2015-11-09 DIAGNOSIS — C189 Malignant neoplasm of colon, unspecified: Secondary | ICD-10-CM

## 2015-11-09 DIAGNOSIS — C7800 Secondary malignant neoplasm of unspecified lung: Secondary | ICD-10-CM | POA: Diagnosis not present

## 2015-11-09 DIAGNOSIS — R51 Headache: Secondary | ICD-10-CM

## 2015-11-09 DIAGNOSIS — R918 Other nonspecific abnormal finding of lung field: Secondary | ICD-10-CM

## 2015-11-09 DIAGNOSIS — C787 Secondary malignant neoplasm of liver and intrahepatic bile duct: Secondary | ICD-10-CM

## 2015-11-09 DIAGNOSIS — I82621 Acute embolism and thrombosis of deep veins of right upper extremity: Secondary | ICD-10-CM | POA: Diagnosis not present

## 2015-11-09 LAB — CBC WITH DIFFERENTIAL/PLATELET
BASOS ABS: 0 10*3/uL (ref 0.0–0.1)
BASOS PCT: 0 %
Eosinophils Absolute: 0.4 10*3/uL (ref 0.0–0.7)
Eosinophils Relative: 3 %
HEMATOCRIT: 41.2 % (ref 36.0–46.0)
HEMOGLOBIN: 13.4 g/dL (ref 12.0–15.0)
LYMPHS PCT: 27 %
Lymphs Abs: 3.3 10*3/uL (ref 0.7–4.0)
MCH: 28.6 pg (ref 26.0–34.0)
MCHC: 32.5 g/dL (ref 30.0–36.0)
MCV: 88 fL (ref 78.0–100.0)
MONOS PCT: 6 %
Monocytes Absolute: 0.8 10*3/uL (ref 0.1–1.0)
NEUTROS ABS: 7.9 10*3/uL — AB (ref 1.7–7.7)
NEUTROS PCT: 64 %
Platelets: 303 10*3/uL (ref 150–400)
RBC: 4.68 MIL/uL (ref 3.87–5.11)
RDW: 14.3 % (ref 11.5–15.5)
WBC: 12.4 10*3/uL — ABNORMAL HIGH (ref 4.0–10.5)

## 2015-11-09 LAB — COMPREHENSIVE METABOLIC PANEL
ALBUMIN: 3.5 g/dL (ref 3.5–5.0)
ALK PHOS: 100 U/L (ref 38–126)
ALT: 29 U/L (ref 14–54)
AST: 21 U/L (ref 15–41)
Anion gap: 13 (ref 5–15)
BILIRUBIN TOTAL: 0.5 mg/dL (ref 0.3–1.2)
BUN: 8 mg/dL (ref 6–20)
CALCIUM: 9.3 mg/dL (ref 8.9–10.3)
CO2: 24 mmol/L (ref 22–32)
CREATININE: 0.66 mg/dL (ref 0.44–1.00)
Chloride: 102 mmol/L (ref 101–111)
GFR calc Af Amer: >60 mL/min (ref >60)
GLUCOSE: 254 mg/dL — AB (ref 65–99)
Potassium: 3.5 mmol/L (ref 3.5–5.1)
Sodium: 139 mmol/L (ref 135–145)
TOTAL PROTEIN: 7.9 g/dL (ref 6.5–8.1)

## 2015-11-09 MED ORDER — SUCRALFATE 1 GM/10ML PO SUSP: 1.0000 g | Freq: Three times a day (TID) | ORAL | Status: DC

## 2015-11-09 MED ORDER — HEPARIN SOD (PORK) LOCK FLUSH 100 UNIT/ML IV SOLN
500.0000 [IU] | Freq: Once | INTRAVENOUS | Status: AC
  Administered 2015-11-09: 500 [IU] via INTRAVENOUS
  Filled 2015-11-09: qty 5

## 2015-11-09 MED ORDER — SODIUM CHLORIDE 0.9% FLUSH
10.0000 mL | INTRAVENOUS | Status: DC | PRN
  Administered 2015-11-09: 10 mL via INTRAVENOUS
  Filled 2015-11-09: qty 10

## 2015-11-09 MED ORDER — OXYCODONE-ACETAMINOPHEN 5-325 MG PO TABS: 1.0000 | ORAL_TABLET | ORAL | Status: DC | PRN

## 2015-11-09 NOTE — Progress Notes (Signed)
Erica Barker presented for Portacath access and flush.  Proper placement of portacath confirmed by CXR.  Portacath located right chest wall accessed with  H 20 needle.  Good blood return present. Portacath flushed with 17m NS and 500U/532mHeparin and needle removed intact.  Procedure tolerated well and without incident.

## 2015-11-09 NOTE — Progress Notes (Signed)
Please see doctors encounter for more information 

## 2015-11-09 NOTE — Progress Notes (Signed)
[place] Progress Note  Patient Care Team: No Pcp Per Patient as PCP - General ([place]) Melrose Nakayama, MD as Consulting Physician (Cardiothoracic Surgery)  CHIEF COMPLAINTS/PURPOSE OF CONSULTATION:  Stage IV CRC CT of abdomen and pelvis on 02/20/2014 at [facility] showing 3.6 cm apple core lesion in the proximal sigmoid colon 11 mm hypoenhancing lesion in the medial left hepatic dome, 4.1 cm right lower lobe mass and adjacent right lower lobe nodule CEA on 08/18/2014 of 93.7 ng/ml Right upper extremity DVT 04/17/2014 paired brachial veins, axillary vein, and peripheral aspect of the right subclavian vein CT chest 07/06/2014 with bilateral pulmonary nodules and masses, RUL, lateral RUL, posterior LUL, lobulated nodule in the subpleural RLL,     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 ([place])   02/20/2014 Imaging CT of abdomen and pelvis on 02/20/2014 at [facility] showing 3.6 cm apple core lesion in the proximal sigmoid colon 11 mm hypoenhancing lesion in the medial left hepatic dome, 4.1 cm right lower lobe mass and adjacent right lower lobe nodule   02/23/2014 Pathology Results KRAS MUTATED   04/17/2014 Imaging Right upper extremity DVT 04/17/2014 paired brachial veins, axillary vein, and peripheral aspect of the right subclavian vein   07/06/2014 Imaging CT chest 07/06/2014 with bilateral pulmonary nodules and masses, RUL, lateral RUL, posterior LUL, lobulated nodule in the subpleural RLL   08/18/2014 Tumor Marker CEA 93.7 ng/ml   10/09/2014 Initial Diagnosis 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   02/19/2015 Imaging Progressive pulmonary metastatic disease as detailed above. Stable mediastinal lymphadenopathy. No abdominal/pelvic metastatic disease is identified   04/24/2015 - 08/07/2015 Chemotherapy XELIRI started on  04/24/2015. Patient did not want to wear 5-FU pump.  On 2000 mg BID 14 days on and 7 days off.   04/28/2015 Adverse Reaction Mild stomatitis.  Xeloda held.  Treated with Magic Mouthwash.  Resolved by 05/02/2015 at office visit.   05/15/2015 Treatment Plan Change Will restart Xeloda at 1500 mg BID 14 days on and 7 days off (11/1)   07/17/2015 Adverse Reaction Mouth sores   07/17/2015 Treatment Plan Change Change in Xeloda frequency: 1500 mg BID 7 days on and 7 days off.   07/24/2015 Adverse Reaction Increased nausea/vomiting   08/07/2015 Treatment Plan Change Irinotecan dose reduced by 10%   08/10/2015 Imaging MRI brain- Scattered punctate subcortical T2 hyperintensities bilaterally are slightly greater than expected for age. The finding is nonspecific but can be seen in the setting of chronic microvascular ischemia, a demyelinating process such as multiple...   09/03/2015 Progression CT CAP- demonstrates new hepatic lesions   09/03/2015 Imaging CT CAP- Multiple new hepatic metastasis in the R hepatic lobe corresponds to subtle hypodensities identified on comparison exam.  Stable metastatic bilateral pulmonary nodules and masses. Small mesenteric lymph nodes along the sigmoid colon are unchanged   09/24/2015 -  Chemotherapy Stivarga 160 mg daily 21/28 days    HISTORY OF PRESENTING ILLNESS:   Erica Barker 55 y.o. female is here because of stage IV colon cancer. She has progressive disease. Unfortunately is kras mutated and MSI stable.   Erica Barker was here alone today.  She is still having headaches but they are not as frequent. She goes to her headache doctor on the 8th of May. She thinks that her Prinivil is causing her headaches.   She does not have any sores on  her hands or feet. She says that they just peel. She denies nausea or vomiting. She denies new pain. No SOB or CP.   Her eyes are watery because of allergies. She asked what should she take for this.   She just got back from her cruise  in the [place]. She notes that she had a good time.   She has had no other problems with her medications at this time.   MEDICAL HISTORY:  Past Medical History  Diagnosis Date  . Hypertension   . Depression   . Pulmonary nodules 01/30/14    CTA CHEST  . Lymphadenopathy, mediastinal 01/2014    [facility]  . Morbid obesity (HCC)   . Diabetes mellitus, type II (HCC)   . Headache(784.0)   . Medically noncompliant 09/06/2015    SURGICAL HISTORY: Past Surgical History  Procedure Laterality Date  . Cholecystectomy    . Video bronchoscopy with endobronchial ultrasound N/A 02/06/2014    Procedure: VIDEO BRONCHOSCOPY WITH ENDOBRONCHIAL ULTRASOUND,bronchial biopsies , node sampling;  Surgeon: Loreli Slot, MD;  Location: [facility] OR;  Service: Thoracic;  Laterality: N/A;  . Tubal ligation      SOCIAL HISTORY: Social History   Social History  . Marital Status: Widowed    Spouse Name: N/A  . Number of Children: N/A  . Years of Education: N/A   Occupational History  . Not on file.   Social History Main Topics  . Smoking status: Former Smoker -- 0.50 packs/day for 7 years    Types: Cigarettes    Quit date: 02/03/1989  . Smokeless tobacco: Never Used  . Alcohol Use: Yes     Comment: occasional  . Drug Use: No  . Sexual Activity: Not on file   Other Topics Concern  . Not on file   Social History Narrative  She is in a relationship. 3 children. Aged 32,35,20.  She has no grandchildren. She smoked in high school but quit after graduation. No ETOH. 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  She is originally from [place].  FAMILY HISTORY: Family History  Problem Relation Age of Onset  . Cancer Mother     UTERINE  . Cancer Father     LEUKEMIA  . Crohn's disease Son    indicated that her mother is alive. She indicated that her father is deceased. She indicated that her son is alive.   Father is deceased at age 28  from leukemia, mother is alive at 72 with a history of uterine cancer.  She has one sister how is healthy and one brother who is healthy. He works as a Emergency planning/management officer.   ALLERGIES:  is allergic to asa.  MEDICATIONS:  Current Outpatient Prescriptions  Medication Sig Dispense Refill  . aspirin-acetaminophen-caffeine (EXCEDRIN MIGRAINE) 250-250-65 MG per tablet Take 2 tablets by mouth every 6 (six) hours as needed for headache.    . dexlansoprazole (DEXILANT) 60 MG capsule Take 1 capsule (60 mg total) by mouth daily. 30 capsule 5  . escitalopram (LEXAPRO) 20 MG tablet Take 1/2 tablet by mouth daily for 5 days then increase to one tablet daily 30 tablet 6  . lisinopril (PRINIVIL,ZESTRIL) 10 MG tablet Take 1 tablet (10 mg total) by mouth daily. 30 tablet 6  . metFORMIN (GLUCOPHAGE) 500 MG tablet Take 1 tablet (500 mg total) by mouth daily with breakfast. 30 tablet 3  . Multiple Vitamin (MULTIVITAMIN) capsule Take 1 capsule  by mouth daily.    Marland Kitchen oxyCODONE-acetaminophen (PERCOCET/ROXICET) 5-325 MG tablet Take 1 tablet by mouth every 4 (four) hours as needed for severe pain. 60 tablet 0  . promethazine (PHENERGAN) 25 MG suppository Place 1 suppository (25 mg total) rectally every 6 (six) hours as needed for nausea or vomiting. 12 each 0  . regorafenib (STIVARGA) 40 MG tablet Take 4 tablets (160 mg total) by mouth daily with breakfast. Take with low fat meal. Caution: Chemotherapy. 84 tablet 0  . rivaroxaban (XARELTO) 20 MG TABS tablet Take 1 tablet (20 mg total) by mouth daily with supper. 30 tablet 3  . sucralfate (CARAFATE) 1 GM/10ML suspension Take 10 mLs (1 g total) by mouth 4 (four) times daily -  with meals and at bedtime. 420 mL 1  . Diphenhyd-Hydrocort-Nystatin (FIRST-DUKES MOUTHWASH) SUSP Use as directed 5 mLs in the mouth or throat 4 (four) times daily as needed. (Patient not taking: Reported on 10/01/2015) 300 mL 2  . diphenoxylate-atropine (LOMOTIL) 2.5-0.025 MG per tablet Take 1 tablet by mouth  every 4 (four) hours as needed for diarrhea or loose stools. (Patient not taking: Reported on 10/01/2015) 45 tablet 0  . loperamide (IMODIUM) 2 MG capsule Take 2 caps after 1st loose stool then 1 cap every 2 hours until you go 12 hours without having a loose stool. At bedtime for diarrhea, take 2 caps then 2 caps every 4 hours until morning. (Patient not taking: Reported on 10/01/2015) 60 capsule 0  . magic mouthwash SOLN Swish and swallow 55m by mouth every 4 hours as needed for mouth pain. (Patient not taking: Reported on 10/01/2015) 360 mL 3   Current Facility-Administered Medications  Medication Dose Route Frequency Provider Last Rate Last Dose  . heparin lock flush 100 unit/mL  500 Units Intravenous Once SPatrici Ranks MD      . sodium chloride flush (NS) 0.9 % injection 10 mL  10 mL Intravenous PRN SPatrici Ranks MD        Review of Systems  Constitutional: Negative for fever, chills, weight loss and malaise/fatigue.  HENT: Positive for headaches  Negative for congestion, hearing loss, nosebleeds, sore throat and tinnitus.  Described as migraines that occur every 3-4 days, alleviated with pain medication.  Eyes: Negative for blurred vision, double vision, pain and discharge.  Respiratory: Negative for cough, hemoptysis, sputum production, shortness of breath and wheezing.   Cardiovascular: Negative for chest pain, palpitations, claudication, leg swelling and PND.  Gastrointestinal: Positive for constipation. Negative for nausea, vomiting, abdominal pain, diarrhea, constipation, heartburn, blood in stool and melena.  Constipation due to Stivarga medication. Genitourinary: Negative for dysuria, urgency, frequency and hematuria.  Musculoskeletal: Negative for myalgias, joint pain and falls.  Skin:  Positive for feet peeling, mild. Negative for rash and skin changes Feet peeling due to Stivarga medication. Uses lotion to alleviate.  Neurological: Negative for dizziness, tingling,  tremors, sensory change, speech change, focal weakness, seizures, loss of consciousness, weakness. Endo/Heme/Allergies: Does not bruise/bleed easily.  Psychiatric/Behavioral: Negative for depression, suicidal ideas, memory loss and substance abuse. The patient is not nervous/anxious and does not have insomnia.   14 point review of systems was performed and is negative except as detailed under history of present illness and above  PHYSICAL EXAMINATION: ECOG PERFORMANCE STATUS: 1 - Symptomatic but completely ambulatory  Filed Vitals:   11/09/15 0900  BP: 124/84  Pulse: 92  Temp: 98.8 F (37.1 C)  Resp: 16   Filed Weights   11/09/15 0900  Weight: 247 lb 4.8 oz (112.175 kg)    Physical Exam  Constitutional: She is oriented to person, place, and time and well-developed, well-nourished, and in no distress.  HENT:  Head: Normocephalic and atraumatic.  Nose: Nose normal.  Mouth/Throat: Oropharynx is clear and moist. No oropharyngeal exudate.  Eyes: Conjunctivae and EOM are normal. Pupils are equal, round, and reactive to light. Right eye exhibits no discharge. Left eye exhibits no discharge. No scleral icterus.  Neck: Normal range of motion. Neck supple. No tracheal deviation present. No thyromegaly present.  Cardiovascular: Normal rate, regular rhythm and normal heart sounds.  Exam reveals no gallop and no friction rub.  No murmur heard. Pulmonary/Chest: Effort normal and breath sounds normal. She has no wheezes. She has no rales.  Abdominal: Soft. Bowel sounds are normal. She exhibits no distension and no mass. There is no tenderness. There is no rebound and no guarding.  Musculoskeletal: Normal range of motion. She exhibits no edema.  Lymphadenopathy:    She has no cervical adenopathy.  Neurological: She is alert and oriented to person, place, and time. She has normal reflexes. No cranial nerve deficit. Gait normal. Coordination normal.  Skin: Skin is warm and dry. No rash noted.Mild  peeling of skin on bilateral feet  Psychiatric: Mood, memory, affect and judgment normal.  Nursing note and vitals reviewed.   LABORATORY DATA:  I have reviewed the data as listed.  CBC    Component Value Date/Time   WBC 12.5* 10/17/2015 1015   RBC 4.67 10/17/2015 1015   RBC 4.36 11/16/2014 1214   HGB 13.6 10/17/2015 1015   HCT 41.5 10/17/2015 1015   PLT 276 10/17/2015 1015   MCV 88.9 10/17/2015 1015   MCH 29.1 10/17/2015 1015   MCHC 32.8 10/17/2015 1015   RDW 14.4 10/17/2015 1015   LYMPHSABS 4.2* 10/17/2015 1015   MONOABS 0.8 10/17/2015 1015   EOSABS 0.4 10/17/2015 1015   BASOSABS 0.0 10/17/2015 1015    CMP     Component Value Date/Time   NA 137 10/17/2015 1015   K 3.7 10/17/2015 1015   CL 103 10/17/2015 1015   CO2 25 10/17/2015 1015   GLUCOSE 137* 10/17/2015 1015   BUN 9 10/17/2015 1015   CREATININE 0.61 10/17/2015 1015   CALCIUM 8.9 10/17/2015 1015   PROT 7.7 10/17/2015 1015   ALBUMIN 3.6 10/17/2015 1015   AST 18 10/17/2015 1015   ALT 19 10/17/2015 1015   ALKPHOS 93 10/17/2015 1015   BILITOT 0.4 10/17/2015 1015   GFRNONAA >60 10/17/2015 1015   GFRAA >60 10/17/2015 1015     RADIOGRAPHIC STUDIES I have personally reviewed the radiological images as listed and agreed with the findings in the report.  Study Result     CLINICAL DATA: Colorectal carcinoma diagnosis 2015. Additional lesions in the liver lungs. Chemotherapy ongoing.  EXAM: CT CHEST, ABDOMEN, AND PELVIS WITH CONTRAST  TECHNIQUE: Multidetector CT imaging of the chest, abdomen and pelvis was performed following the standard protocol during bolus administration of intravenous contrast.  CONTRAST: 150m OMNIPAQUE IOHEXOL 300 MG/ML SOLN  COMPARISON: CT 04/02/2015  FINDINGS: CT CHEST FINDINGS  Mediastinum/Nodes: No axillary or supraclavicular lymphadenopathy. Subcarinal lymph node measures 10 mm short axis compared to 14 mm on prior remeasured. No hilar lymphadenopathy. No  pericardial fluid esophagus normal.  Lungs/Pleura: Bilateral pulmonary nodules and masses are not significant changed from prior.  For example RIGHT upper lobe nodule closer 17 mm (image 11, series 6) compared to 17 mm.  RIGHT upper  lobe nodule measures 22 mm (image 16) compared to 24 mm.  RIGHT lower lobe mass measures 41 x 43 mm compared to 41 x 47 mm.  LEFT lower lobe nodule measures 12 mm compared to 11 mm (image 21, series 6).  Musculoskeletal: No aggressive osseous lesion.  CT ABDOMEN AND PELVIS FINDINGS  Hepatobiliary: Interval progression of hepatic mass metastasis with increase in the subtle hypodense lesions in liver which are now represented by a large round lesions. For example 20 mm lesion in the RIGHT hepatic lobe on image 31, series 2. 17 mm lesion the RIGHT hepatic lobe on image 35). 50 mm lesion the lower aspect of the RIGHT hepatic lobe on image 64.  Pancreas: Pancreas is normal. No ductal dilatation. No pancreatic inflammation.  Spleen: Normal spleen  Adrenals/urinary tract: Adrenal glands and kidneys are normal. The ureters and bladder normal.  Stomach/Bowel: Stomach, small bowel, appendix, and cecum are normal. The colon and rectosigmoid colon are normal.  Vascular/Lymphatic: Abdominal aorta is normal caliber. There is no retroperitoneal or periportal lymphadenopathy. No pelvic lymphadenopathy.  Mesenteric lymph nodes described on comparison exam are similar. Example 9 mm lymph node in the sigmoid mesial colon image 85, series 2.  Reproductive: Stable uterus  Other: No free fluid.  Musculoskeletal: No aggressive osseous lesion.  IMPRESSION: Chest Impression:  1. Stable metastatic bilateral pulmonary nodules and masses. 2. Stable to mildly decreased mediastinal adenopathy.  Abdomen / Pelvis Impression:  1. Multiple new hepatic metastasis in the RIGHT hepatic lobe corresponds to subtle hypodensities identified on  comparison exam. 2. Small mesenteric lymph nodes along the sigmoid colon are unchanged.   Electronically Signed  By: Suzy Bouchard M.D.  On: 09/03/2015 18:14   Study Result     CLINICAL DATA: Severe headaches over the last 6 months. Personal history of colon cancer.  EXAM: MRI HEAD WITHOUT AND WITH CONTRAST  TECHNIQUE: Multiplanar, multiecho pulse sequences of the brain and surrounding structures were obtained without and with intravenous contrast.  CONTRAST: 83m MULTIHANCE GADOBENATE DIMEGLUMINE 529 MG/ML IV SOLN  COMPARISON: CT head without contrast 02/26/2015.  FINDINGS: Scattered punctate subcortical T2 hyperintensities are slightly advanced for age. No acute infarct, hemorrhage, or mass lesion is present. Insert pass ventricles insert pass fluid  Flow is present in the major intracranial arteries. Globes and orbits are intact. A single anterior right ethmoid air cell is opacified.  The internal auditory canal is within normal limits bilaterally. The brainstem and cerebellum are unremarkable.  The postcontrast images demonstrate no pathologic enhancement.  IMPRESSION: 1. No acute intracranial abnormality or focal lesion to explain headaches. 2. Scattered punctate subcortical T2 hyperintensities bilaterally are slightly greater than expected for age. The finding is nonspecific but can be seen in the setting of chronic microvascular ischemia, a demyelinating process such as multiple sclerosis, vasculitis, complicated migraine headaches, or as the sequelae of a prior infectious or inflammatory process. These are most likely associated with the patient's headaches.   Electronically Signed  By: CSan MorelleM.D.  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  At times the patient seems to be very accepting of her disease and prognosis and other times she is very difficult time addressing it. She currently has very good  quality of life and is very active.   She has been on Stivarga for 6 weeks now with excellent tolerance.   She still gets occasional debilitating headaches and has been referred to the headache clinic in [place]. She was advised to take her MRI on CD-ROM with her.   We need to start looking for clinical trial options for her. In addition we can send [facility] testing if additional tumor is available, if not perhaps we can re-biospy. I did address this with her today. She seems amenable to the idea if needed.   I told her to take Claritin for her allergies, or other OTC allergy medication.   She needs prescription refills on Carafate and her pain medication.   She will return in 2 weeks for a follow up. She will be notified of her CEA when available.   All questions were answered. The patient knows to call the clinic with any problems, questions or concerns.    This document serves as a record of services personally performed by Ancil Linsey, MD. It was created on her behalf by Kandace Blitz, a trained medical scribe. The creation of this record is based on the scribe's personal observations and the provider's statements to them. This document has been checked and approved by the attending provider.  I have reviewed the above documentation for accuracy and completeness, and I agree with the above.  Erica Fam. Whitney Muse, MD

## 2015-11-09 NOTE — Patient Instructions (Addendum)
[place] at [facility] Discharge Instructions  RECOMMENDATIONS MADE BY THE CONSULTANT AND ANY TEST RESULTS WILL BE SENT TO YOUR REFERRING PHYSICIAN.   Exam and discussion by Dr Whitney Muse today Port flush with labs today, we will call you will lab results carafate refilled Pain medication refilled Continue taking stivarga  Return to see the doctor in 2 weeks with labs Please call the clinic if you have any questions or concerns      Thank you for choosing [place] at [facility] to provide your oncology and hematology care.  To afford each patient quality time with our provider, please arrive at least 15 minutes before your scheduled appointment time.   Beginning January 23rd 2017 lab work for the [name] will be done in the  Main lab at [facility] on 1st floor. If you have a lab appointment with the [facility] please come in thru the  Main Entrance and check in at the main information desk  You need to re-schedule your appointment should you arrive 10 or more minutes late.  We strive to give you quality time with our providers, and arriving late affects you and other patients whose appointments are after yours.  Also, if you no show three or more times for appointments you may be dismissed from the clinic at the providers discretion.     Again, thank you for choosing [facility].  Our hope is that these requests will decrease the amount of time that you wait before being seen by our physicians.       _____________________________________________________________  Should you have questions after your visit to [facility], please contact our office at [phone] between the hours of 8:30 a.m. and 4:30 p.m.  Voicemails left after 4:30 p.m. will not be returned until the following business day.  For prescription refill requests, have your pharmacy contact our office.         Resources For Cancer  Patients and their Caregivers ? American Cancer Society: Can assist with transportation, wigs, general needs, runs Look Good Feel Better.        647-780-0167 ? Cancer Care: Provides financial assistance, online support groups, medication/co-pay assistance.  1-800-813-HOPE 570 526 4883) ? [place] Assists [place] cancer patients and their families through emotional , educational and financial support.  214-220-9070 ? [place] DSS Where to apply for food stamps, Medicaid and utility assistance. 912-830-9848 ? [employer]: Transportation to medical appointments. 205-064-8261 ? Social Security Administration: May apply for disability if have a Stage IV cancer. 724 017 1063 [phone] ? [employer], Disability and Transit Services: Assists with nutrition, care and transit needs. [phone]

## 2015-11-10 LAB — CEA: CEA: 314.1 ng/mL — ABNORMAL HIGH (ref 0.0–4.7)

## 2015-11-13 ENCOUNTER — Other Ambulatory Visit (HOSPITAL_COMMUNITY): Payer: Self-pay | Admitting: Oncology

## 2015-11-13 DIAGNOSIS — C7800 Secondary malignant neoplasm of unspecified lung: Principal | ICD-10-CM

## 2015-11-13 DIAGNOSIS — C189 Malignant neoplasm of colon, unspecified: Secondary | ICD-10-CM

## 2015-11-13 MED ORDER — REGORAFENIB 40 MG PO TABS: 160.0000 mg | ORAL_TABLET | Freq: Every day | ORAL | Status: DC

## 2015-11-20 ENCOUNTER — Encounter

## 2015-11-23 ENCOUNTER — Other Ambulatory Visit (HOSPITAL_COMMUNITY): Payer: Self-pay | Admitting: Oncology

## 2015-11-23 ENCOUNTER — Encounter (HOSPITAL_COMMUNITY): Payer: Self-pay | Admitting: Hematology & Oncology

## 2015-11-23 ENCOUNTER — Encounter (HOSPITAL_BASED_OUTPATIENT_CLINIC_OR_DEPARTMENT_OTHER): Payer: Medicaid Other

## 2015-11-23 ENCOUNTER — Encounter (HOSPITAL_COMMUNITY): Payer: Medicaid Other | Attending: Oncology | Admitting: Hematology & Oncology

## 2015-11-23 VITALS — BP 143/90 | HR 79 | Temp 98.0°F | Resp 16 | Wt 247.6 lb

## 2015-11-23 DIAGNOSIS — C787 Secondary malignant neoplasm of liver and intrahepatic bile duct: Secondary | ICD-10-CM

## 2015-11-23 DIAGNOSIS — C189 Malignant neoplasm of colon, unspecified: Secondary | ICD-10-CM

## 2015-11-23 DIAGNOSIS — C7800 Secondary malignant neoplasm of unspecified lung: Secondary | ICD-10-CM | POA: Diagnosis not present

## 2015-11-23 DIAGNOSIS — R918 Other nonspecific abnormal finding of lung field: Secondary | ICD-10-CM

## 2015-11-23 DIAGNOSIS — E876 Hypokalemia: Secondary | ICD-10-CM

## 2015-11-23 DIAGNOSIS — I82621 Acute embolism and thrombosis of deep veins of right upper extremity: Secondary | ICD-10-CM | POA: Diagnosis not present

## 2015-11-23 LAB — COMPREHENSIVE METABOLIC PANEL
ALK PHOS: 103 U/L (ref 38–126)
ALT: 21 U/L (ref 14–54)
ANION GAP: 9 (ref 5–15)
AST: 24 U/L (ref 15–41)
Albumin: 3.6 g/dL (ref 3.5–5.0)
BILIRUBIN TOTAL: 0.6 mg/dL (ref 0.3–1.2)
BUN: 11 mg/dL (ref 6–20)
CALCIUM: 9.1 mg/dL (ref 8.9–10.3)
CO2: 25 mmol/L (ref 22–32)
Chloride: 101 mmol/L (ref 101–111)
Creatinine, Ser: 0.63 mg/dL (ref 0.44–1.00)
GLUCOSE: 210 mg/dL — AB (ref 65–99)
POTASSIUM: 3.2 mmol/L — AB (ref 3.5–5.1)
Sodium: 135 mmol/L (ref 135–145)
TOTAL PROTEIN: 8.2 g/dL — AB (ref 6.5–8.1)

## 2015-11-23 LAB — CBC WITH DIFFERENTIAL/PLATELET
Basophils Absolute: 0.1 10*3/uL (ref 0.0–0.1)
Basophils Relative: 0 %
Eosinophils Absolute: 0.4 10*3/uL (ref 0.0–0.7)
Eosinophils Relative: 3 %
HEMATOCRIT: 40.4 % (ref 36.0–46.0)
HEMOGLOBIN: 13.2 g/dL (ref 12.0–15.0)
LYMPHS ABS: 3.7 10*3/uL (ref 0.7–4.0)
Lymphocytes Relative: 26 %
MCH: 28.8 pg (ref 26.0–34.0)
MCHC: 32.7 g/dL (ref 30.0–36.0)
MCV: 88 fL (ref 78.0–100.0)
MONO ABS: 1.2 10*3/uL — AB (ref 0.1–1.0)
MONOS PCT: 8 %
NEUTROS ABS: 9 10*3/uL — AB (ref 1.7–7.7)
NEUTROS PCT: 63 %
Platelets: 276 10*3/uL (ref 150–400)
RBC: 4.59 MIL/uL (ref 3.87–5.11)
RDW: 14.4 % (ref 11.5–15.5)
WBC: 14.3 10*3/uL — ABNORMAL HIGH (ref 4.0–10.5)

## 2015-11-23 MED ORDER — HEPARIN SOD (PORK) LOCK FLUSH 100 UNIT/ML IV SOLN
500.0000 [IU] | Freq: Once | INTRAVENOUS | Status: AC
  Administered 2015-11-23: 500 [IU] via INTRAVENOUS
  Filled 2015-11-23: qty 5

## 2015-11-23 MED ORDER — POTASSIUM CHLORIDE CRYS ER 20 MEQ PO TBCR: 40.0000 meq | EXTENDED_RELEASE_TABLET | Freq: Every day | ORAL | Status: DC

## 2015-11-23 MED ORDER — SODIUM CHLORIDE 0.9% FLUSH
10.0000 mL | INTRAVENOUS | Status: DC | PRN
  Administered 2015-11-23: 10 mL via INTRAVENOUS
  Filled 2015-11-23: qty 10

## 2015-11-23 NOTE — Patient Instructions (Addendum)
[place] at [facility] Discharge Instructions  RECOMMENDATIONS MADE BY THE CONSULTANT AND ANY TEST RESULTS WILL BE SENT TO YOUR REFERRING PHYSICIAN.  Return after scans No toxicity with the drug so we do not need to see you every 2 weeks We will do labs monthly Port flush and labs today  Thank you for choosing [place] at [facility] to provide your oncology and hematology care.  To afford each patient quality time with our provider, please arrive at least 15 minutes before your scheduled appointment time.   Beginning January 23rd 2017 lab work for the [name] will be done in the  Main lab at [facility] on 1st floor. If you have a lab appointment with the [facility] please come in thru the  Main Entrance and check in at the main information desk  You need to re-schedule your appointment should you arrive 10 or more minutes late.  We strive to give you quality time with our providers, and arriving late affects you and other patients whose appointments are after yours.  Also, if you no show three or more times for appointments you may be dismissed from the clinic at the providers discretion.     Again, thank you for choosing [facility].  Our hope is that these requests will decrease the amount of time that you wait before being seen by our physicians.       _____________________________________________________________  Should you have questions after your visit to [facility], please contact our office at [phone] between the hours of 8:30 a.m. and 4:30 p.m.  Voicemails left after 4:30 p.m. will not be returned until the following business day.  For prescription refill requests, have your pharmacy contact our office.         Resources For Cancer Patients and their Caregivers ? American Cancer Society: Can assist with transportation, wigs, general needs, runs Look Good Feel Better.         913-535-6790 ? Cancer Care: Provides financial assistance, online support groups, medication/co-pay assistance.  1-800-813-HOPE 705-079-8124) ? [place] Assists [place] cancer patients and their families through emotional , educational and financial support.  (518)194-6593 ? [place] DSS Where to apply for food stamps, Medicaid and utility assistance. 440 479 4176 ? [employer]: Transportation to medical appointments. (321)371-7218 ? Social Security Administration: May apply for disability if have a Stage IV cancer. 380-517-3046 605 194 9314 ? [employer], Disability and Transit Services: Assists with nutrition, care and transit needs. [place] Support Programs: '@10RELATIVEDAYS'$ @ > Cancer Support Group  2nd Tuesday of the month 1pm-2pm, Journey Room  > Creative Journey  3rd Tuesday of the month 1130am-1pm, Journey Room  > Look Good Feel Better  1st Wednesday of the month 10am-12 noon, Journey Room (Call [place] to register [phone])

## 2015-11-23 NOTE — Progress Notes (Signed)
Erica Barker presented for Portacath access and flush.  Proper placement of portacath confirmed by CXR.  Portacath located right  chest wall accessed with  H 20 needle.  Good blood return present. Portacath flushed with 40m NS and 500U/527mHeparin and needle removed intact.  Procedure tolerated well and without incident.

## 2015-11-23 NOTE — Patient Instructions (Signed)
[place] at [facility] Discharge Instructions  RECOMMENDATIONS MADE BY THE CONSULTANT AND ANY TEST RESULTS WILL BE SENT TO YOUR REFERRING PHYSICIAN.  Port flush with labs today Follow up as scheduled   Thank you for choosing [place] at [facility] to provide your oncology and hematology care.  To afford each patient quality time with our provider, please arrive at least 15 minutes before your scheduled appointment time.   Beginning January 23rd 2017 lab work for the [name] will be done in the  Main lab at [facility] on 1st floor. If you have a lab appointment with the [facility] please come in thru the  Main Entrance and check in at the main information desk  You need to re-schedule your appointment should you arrive 10 or more minutes late.  We strive to give you quality time with our providers, and arriving late affects you and other patients whose appointments are after yours.  Also, if you no show three or more times for appointments you may be dismissed from the clinic at the providers discretion.     Again, thank you for choosing [facility].  Our hope is that these requests will decrease the amount of time that you wait before being seen by our physicians.       _____________________________________________________________  Should you have questions after your visit to [facility], please contact our office at [phone] between the hours of 8:30 a.m. and 4:30 p.m.  Voicemails left after 4:30 p.m. will not be returned until the following business day.  For prescription refill requests, have your pharmacy contact our office.         Resources For Cancer Patients and their Caregivers ? American Cancer Society: Can assist with transportation, wigs, general needs, runs Look Good Feel Better.        ([phone] ? Cancer Care: Provides financial assistance, online support groups,  medication/co-pay assistance.  1-800-813-HOPE (719)826-6968) ? [place] Assists [place] cancer patients and their families through emotional , educational and financial support.  7324826059 ? [place] DSS Where to apply for food stamps, Medicaid and utility assistance. 602-101-7934 ? [employer]: Transportation to medical appointments. 863-825-2842 ? Social Security Administration: May apply for disability if have a Stage IV cancer. 330-862-8870 215 249 1540 ? [employer], Disability and Transit Services: Assists with nutrition, care and transit needs. [place] Support Programs: '@10RELATIVEDAYS'$ @ > Cancer Support Group  2nd Tuesday of the month 1pm-2pm, Journey Room  > Creative Journey  3rd Tuesday of the month 1130am-1pm, Journey Room  > Look Good Feel Better  1st Wednesday of the month 10am-12 noon, Journey Room (Call [place] to register [phone])

## 2015-11-23 NOTE — Progress Notes (Signed)
[facility] Progress Note  Patient Care Team: No Pcp Per Patient as PCP - General ([place]) Melrose Nakayama, MD as Consulting Physician (Cardiothoracic Surgery)  CHIEF COMPLAINTS/PURPOSE OF CONSULTATION:  Stage IV CRC CT of abdomen and pelvis on 02/20/2014 at [facility] showing 3.6 cm apple core lesion in the proximal sigmoid colon 11 mm hypoenhancing lesion in the medial left hepatic dome, 4.1 cm right lower lobe mass and adjacent right lower lobe nodule CEA on 08/18/2014 of 93.7 ng/ml Right upper extremity DVT 04/17/2014 paired brachial veins, axillary vein, and peripheral aspect of the right subclavian vein CT chest 07/06/2014 with bilateral pulmonary nodules and masses, RUL, lateral RUL, posterior LUL, lobulated nodule in the subpleural RLL,     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 ([place])   02/20/2014 Imaging CT of abdomen and pelvis on 02/20/2014 at [facility] showing 3.6 cm apple core lesion in the proximal sigmoid colon 11 mm hypoenhancing lesion in the medial left hepatic dome, 4.1 cm right lower lobe mass and adjacent right lower lobe nodule   02/23/2014 Pathology Results KRAS MUTATED   04/17/2014 Imaging Right upper extremity DVT 04/17/2014 paired brachial veins, axillary vein, and peripheral aspect of the right subclavian vein   07/06/2014 Imaging CT chest 07/06/2014 with bilateral pulmonary nodules and masses, RUL, lateral RUL, posterior LUL, lobulated nodule in the subpleural RLL   08/18/2014 Tumor Marker CEA 93.7 ng/ml   10/09/2014 Initial Diagnosis 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   02/19/2015 Imaging Progressive pulmonary metastatic disease as detailed above. Stable mediastinal lymphadenopathy. No abdominal/pelvic metastatic disease is identified   04/24/2015 - 08/07/2015 Chemotherapy XELIRI started on  04/24/2015. Patient did not want to wear 5-FU pump.  On 2000 mg BID 14 days on and 7 days off.   04/28/2015 Adverse Reaction Mild stomatitis.  Xeloda held.  Treated with Magic Mouthwash.  Resolved by 05/02/2015 at office visit.   05/15/2015 Treatment Plan Change Will restart Xeloda at 1500 mg BID 14 days on and 7 days off (11/1)   07/17/2015 Adverse Reaction Mouth sores   07/17/2015 Treatment Plan Change Change in Xeloda frequency: 1500 mg BID 7 days on and 7 days off.   07/24/2015 Adverse Reaction Increased nausea/vomiting   08/07/2015 Treatment Plan Change Irinotecan dose reduced by 10%   08/10/2015 Imaging MRI brain- Scattered punctate subcortical T2 hyperintensities bilaterally are slightly greater than expected for age. The finding is nonspecific but can be seen in the setting of chronic microvascular ischemia, a demyelinating process such as multiple...   09/03/2015 Progression CT CAP- demonstrates new hepatic lesions   09/03/2015 Imaging CT CAP- Multiple new hepatic metastasis in the R hepatic lobe corresponds to subtle hypodensities identified on comparison exam.  Stable metastatic bilateral pulmonary nodules and masses. Small mesenteric lymph nodes along the sigmoid colon are unchanged   09/24/2015 -  Chemotherapy Stivarga 160 mg daily 21/28 days    HISTORY OF PRESENTING ILLNESS:   Erica Barker 55 y.o. female is here because of stage IV colon cancer. She has progressive disease. Unfortunately is kras mutated and MSI stable.   Erica Barker is unaccompanied.  She is feeling well without problems. Her feet are still "peely, scaly", but denies pain.  Denies mouth sores. Reports frequent urination without dysuria. Her energy levels are beginning to improve. Appetite has been okay. Denies nausea or vomiting; admits to some  nausea with a headache the other day resolved with light foods like apple sauce. Denies leg pain, abdominal pain, or diarrhea.   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   She saw the headache doctor who reviewed her brain MRI and believes her headaches may be caused by nerves. She was given medication and will follow up with him in a few weeks. He mentioned a treatment with "needles" but she is apprehensive. She had a big headache the day after seeing him.   No SOB, cough.  MEDICAL HISTORY:  Past Medical History  Diagnosis Date  . Hypertension   . Depression   . Pulmonary nodules 01/30/14    CTA CHEST  . Lymphadenopathy, mediastinal 01/2014    CTA ANGIO .[place]  . Morbid obesity ([place])   . Diabetes mellitus, type II ([place])   . Headache(784.0)   . Medically noncompliant 09/06/2015    SURGICAL HISTORY: Past Surgical History  Procedure Laterality Date  . Cholecystectomy    . Video bronchoscopy with endobronchial ultrasound N/A 02/06/2014    Procedure: VIDEO BRONCHOSCOPY WITH ENDOBRONCHIAL ULTRASOUND,bronchial biopsies , node sampling;  Surgeon: Melrose Nakayama, MD;  Location: [facility];  Service: Thoracic;  Laterality: N/A;  . Tubal ligation      SOCIAL HISTORY: Social History   Social History  . Marital Status: Widowed    Spouse Name: N/A  . Number of Children: N/A  . Years of Education: N/A   Occupational History  . Not on file.   Social History Main Topics  . Smoking status: Former Smoker -- 0.50 packs/day for 7 years    Types: Cigarettes    Quit date: 02/03/1989  . Smokeless tobacco: Never Used  . Alcohol Use: Yes     Comment: occasional  . Drug Use: No  . Sexual Activity: Not on file   Other Topics Concern  . Not on file   Social History Narrative  She is in a relationship. 3 children. Aged 32,35,20.  She has no grandchildren. She smoked in high school but quit after graduation. No ETOH. 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  She is originally from [place].  FAMILY HISTORY: Family History  Problem Relation Age of Onset  . Cancer Mother      UTERINE  . Cancer Father     LEUKEMIA  . Crohn's disease Son    indicated that her mother is alive. She indicated that her father is deceased. She indicated that her son is alive.   Father is deceased at age 34 from leukemia, mother is alive at 59 with a history of uterine cancer.  She has one sister how is healthy and one brother who is healthy. He works as a Engineer, structural.   ALLERGIES:  is allergic to asa.  MEDICATIONS:  Current Outpatient Prescriptions  Medication Sig Dispense Refill  . aspirin-acetaminophen-caffeine (EXCEDRIN MIGRAINE) 250-250-65 MG per tablet Take 2 tablets by mouth every 6 (six) hours as needed for headache.    . baclofen (LIORESAL) 10 MG tablet Take 10 mg by mouth 2 (two) times daily as needed for muscle spasms.    Marland Kitchen dexlansoprazole (DEXILANT) 60 MG capsule Take 1 capsule (60 mg total) by mouth daily. 30 capsule 5  . Diphenhyd-Hydrocort-Nystatin (FIRST-DUKES MOUTHWASH) SUSP Use as directed 5 mLs in the mouth or throat 4 (four) times daily as needed. 300 mL 2  .  diphenoxylate-atropine (LOMOTIL) 2.5-0.025 MG per tablet Take 1 tablet by mouth every 4 (four) hours as needed for diarrhea or loose stools. 45 tablet 0  . escitalopram (LEXAPRO) 20 MG tablet Take 1/2 tablet by mouth daily for 5 days then increase to one tablet daily 30 tablet 6  . gabapentin (NEURONTIN) 300 MG capsule Take 300 mg by mouth at bedtime.    Marland Kitchen lisinopril (PRINIVIL,ZESTRIL) 10 MG tablet Take 1 tablet (10 mg total) by mouth daily. 30 tablet 6  . loperamide (IMODIUM) 2 MG capsule Take 2 caps after 1st loose stool then 1 cap every 2 hours until you go 12 hours without having a loose stool. At bedtime for diarrhea, take 2 caps then 2 caps every 4 hours until morning. 60 capsule 0  . magic mouthwash SOLN Swish and swallow 73m by mouth every 4 hours as needed for mouth pain. 360 mL 3  . metFORMIN (GLUCOPHAGE) 500 MG tablet Take 1 tablet (500 mg total) by mouth daily with breakfast. 30 tablet 3  .  Multiple Vitamin (MULTIVITAMIN) capsule Take 1 capsule by mouth daily.    .Marland KitchenoxyCODONE-acetaminophen (PERCOCET/ROXICET) 5-325 MG tablet Take 1 tablet by mouth every 4 (four) hours as needed for severe pain. 60 tablet 0  . promethazine (PHENERGAN) 25 MG suppository Place 1 suppository (25 mg total) rectally every 6 (six) hours as needed for nausea or vomiting. 12 each 0  . regorafenib (STIVARGA) 40 MG tablet Take 4 tablets (160 mg total) by mouth daily with breakfast. Take with low fat meal. Caution: Chemotherapy. 84 tablet 0  . rivaroxaban (XARELTO) 20 MG TABS tablet Take 1 tablet (20 mg total) by mouth daily with supper. 30 tablet 3  . sucralfate (CARAFATE) 1 GM/10ML suspension Take 10 mLs (1 g total) by mouth 4 (four) times daily -  with meals and at bedtime. 420 mL 1  . potassium chloride SA (K-DUR,KLOR-CON) 20 MEQ tablet Take 2 tablets (40 mEq total) by mouth daily. 60 tablet 0   No current facility-administered medications for this visit.    Review of Systems  Constitutional: Negative for fever, chills, weight loss and malaise/fatigue.  HENT: Positive for headaches  Negative for congestion, hearing loss, nosebleeds, sore throat and tinnitus.  Described as migraines that occur every 3-4 days, alleviated with pain medication.  Eyes: Negative for blurred vision, double vision, pain and discharge.  Respiratory: Negative for cough, hemoptysis, sputum production, shortness of breath and wheezing.   Cardiovascular: Negative for chest pain, palpitations, claudication, leg swelling and PND.  Gastrointestinal:  Negative for nausea, vomiting, abdominal pain, diarrhea, constipation, heartburn, blood in stool and melena.  Genitourinary: Negative for dysuria, urgency, frequency and hematuria.  Musculoskeletal: Negative for myalgias, joint pain and falls.  Skin:  Positive for feet peeling, mild. Negative for rash and skin changes Neurological: Negative for dizziness, tingling, tremors, sensory change,  speech change, focal weakness, seizures, loss of consciousness, weakness. Endo/Heme/Allergies: Does not bruise/bleed easily.  Psychiatric/Behavioral: Negative for depression, suicidal ideas, memory loss and substance abuse. The patient is not nervous/anxious and does not have insomnia.   14 point review of systems was performed and is negative except as detailed under history of present illness and above  PHYSICAL EXAMINATION: ECOG PERFORMANCE STATUS: 1 - Symptomatic but completely ambulatory  Filed Vitals:   11/23/15 0847  BP: 143/90  Pulse: 79  Temp: 98 F (36.7 C)  Resp: 16   Filed Weights   11/23/15 0847  Weight: 247 lb 9.6 oz (112.311 kg)  Physical Exam  Constitutional: She is oriented to person, place, and time and well-developed, well-nourished, and in no distress.  HENT:  Head: Normocephalic and atraumatic.  Nose: Nose normal.  Mouth/Throat: Oropharynx is clear and moist. No oropharyngeal exudate.  Eyes: Conjunctivae and EOM are normal. Pupils are equal, round, and reactive to light. Right eye exhibits no discharge. Left eye exhibits no discharge. No scleral icterus.  Neck: Normal range of motion. Neck supple. No tracheal deviation present. No thyromegaly present.  Cardiovascular: Normal rate, regular rhythm and normal heart sounds.  Exam reveals no gallop and no friction rub.  No murmur heard. Pulmonary/Chest: Effort normal and breath sounds normal. She has no wheezes. She has no rales.  Abdominal: Soft. Bowel sounds are normal. She exhibits no distension and no mass. There is no tenderness. There is no rebound and no guarding.  Musculoskeletal: Normal range of motion. She exhibits no edema.  Lymphadenopathy:    She has no cervical adenopathy.  Neurological: She is alert and oriented to person, place, and time. She has normal reflexes. No cranial nerve deficit. Gait normal. Coordination normal.  Skin: Skin is warm and dry. No rash noted.Mild peeling of skin on bilateral  feet  Psychiatric: Mood, memory, affect and judgment normal.  Nursing note and vitals reviewed.   LABORATORY DATA:  I have reviewed the data as listed.  CBC    Component Value Date/Time   WBC 14.3* 11/23/2015 1048   RBC 4.59 11/23/2015 1048   RBC 4.36 11/16/2014 1214   HGB 13.2 11/23/2015 1048   HCT 40.4 11/23/2015 1048   PLT 276 11/23/2015 1048   MCV 88.0 11/23/2015 1048   MCH 28.8 11/23/2015 1048   MCHC 32.7 11/23/2015 1048   RDW 14.4 11/23/2015 1048   LYMPHSABS 3.7 11/23/2015 1048   MONOABS 1.2* 11/23/2015 1048   EOSABS 0.4 11/23/2015 1048   BASOSABS 0.1 11/23/2015 1048    CMP     Component Value Date/Time   NA 135 11/23/2015 1048   K 3.2* 11/23/2015 1048   CL 101 11/23/2015 1048   CO2 25 11/23/2015 1048   GLUCOSE 210* 11/23/2015 1048   BUN 11 11/23/2015 1048   CREATININE 0.63 11/23/2015 1048   CALCIUM 9.1 11/23/2015 1048   PROT 8.2* 11/23/2015 1048   ALBUMIN 3.6 11/23/2015 1048   AST 24 11/23/2015 1048   ALT 21 11/23/2015 1048   ALKPHOS 103 11/23/2015 1048   BILITOT 0.6 11/23/2015 1048   GFRNONAA >60 11/23/2015 1048   GFRAA >60 11/23/2015 1048     RADIOGRAPHIC STUDIES I have personally reviewed the radiological images as listed and agreed with the findings in the report.  Study Result     CLINICAL DATA: Colorectal carcinoma diagnosis 2015. Additional lesions in the liver lungs. Chemotherapy ongoing.  EXAM: CT CHEST, ABDOMEN, AND PELVIS WITH CONTRAST  TECHNIQUE: Multidetector CT imaging of the chest, abdomen and pelvis was performed following the standard protocol during bolus administration of intravenous contrast.  CONTRAST: OMNIPAQUE IOHEXOL 300 MG/ML SOLN  COMPARISON: CT 04/02/2015  FINDINGS: CT CHEST FINDINGS  Mediastinum/Nodes: No axillary or supraclavicular lymphadenopathy. Subcarinal lymph node measures 10 mm short axis compared to 14 mm on prior remeasured. No hilar lymphadenopathy. No pericardial fluid esophagus  normal.  Lungs/Pleura: Bilateral pulmonary nodules and masses are not significant changed from prior.  For example RIGHT upper lobe nodule closer 17 mm (image 11, series 6) compared to 17 mm.  RIGHT upper lobe nodule measures 22 mm (image 16) compared to 24  mm.  RIGHT lower lobe mass measures 41 x 43 mm compared to 41 x 47 mm.  LEFT lower lobe nodule measures 12 mm compared to 11 mm (image 21, series 6).  Musculoskeletal: No aggressive osseous lesion.  CT ABDOMEN AND PELVIS FINDINGS  Hepatobiliary: Interval progression of hepatic mass metastasis with increase in the subtle hypodense lesions in liver which are now represented by a large round lesions. For example 20 mm lesion in the RIGHT hepatic lobe on image 31, series 2. 17 mm lesion the RIGHT hepatic lobe on image 35). 50 mm lesion the lower aspect of the RIGHT hepatic lobe on image 64.  Pancreas: Pancreas is normal. No ductal dilatation. No pancreatic inflammation.  Spleen: Normal spleen  Adrenals/urinary tract: Adrenal glands and kidneys are normal. The ureters and bladder normal.  Stomach/Bowel: Stomach, small bowel, appendix, and cecum are normal. The colon and rectosigmoid colon are normal.  Vascular/Lymphatic: Abdominal aorta is normal caliber. There is no retroperitoneal or periportal lymphadenopathy. No pelvic lymphadenopathy.  Mesenteric lymph nodes described on comparison exam are similar. Example 9 mm lymph node in the sigmoid mesial colon image 85, series 2.  Reproductive: Stable uterus  Other: No free fluid.  Musculoskeletal: No aggressive osseous lesion.  IMPRESSION: Chest Impression:  1. Stable metastatic bilateral pulmonary nodules and masses. 2. Stable to mildly decreased mediastinal adenopathy.  Abdomen / Pelvis Impression:  1. Multiple new hepatic metastasis in the RIGHT hepatic lobe corresponds to subtle hypodensities identified on comparison exam. 2. Small  mesenteric lymph nodes along the sigmoid colon are unchanged.   Electronically Signed  By: Suzy Bouchard M.D.  On: 09/03/2015 18:14   Study Result     CLINICAL DATA: Severe headaches over the last 6 months. Personal history of colon cancer.  EXAM: MRI HEAD WITHOUT AND WITH CONTRAST  TECHNIQUE: Multiplanar, multiecho pulse sequences of the brain and surrounding structures were obtained without and with intravenous contrast.  CONTRAST: 21m MULTIHANCE GADOBENATE DIMEGLUMINE 529 MG/ML IV SOLN  COMPARISON: CT head without contrast 02/26/2015.  FINDINGS: Scattered punctate subcortical T2 hyperintensities are slightly advanced for age. No acute infarct, hemorrhage, or mass lesion is present. Insert pass ventricles insert pass fluid  Flow is present in the major intracranial arteries. Globes and orbits are intact. A single anterior right ethmoid air cell is opacified.  The internal auditory canal is within normal limits bilaterally. The brainstem and cerebellum are unremarkable.  The postcontrast images demonstrate no pathologic enhancement.  IMPRESSION: 1. No acute intracranial abnormality or focal lesion to explain headaches. 2. Scattered punctate subcortical T2 hyperintensities bilaterally are slightly greater than expected for age. The finding is nonspecific but can be seen in the setting of chronic microvascular ischemia, a demyelinating process such as multiple sclerosis, vasculitis, complicated migraine headaches, or as the sequelae of a prior infectious or inflammatory process. These are most likely associated with the patient's headaches.   Electronically Signed  By: CSan MorelleM.D.  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  Pleasant 55year old female with stage IV colorectal cancer.  At times the patient seems to be very accepting of her disease and prognosis and other times she is very difficult time addressing it. She currently has very good quality of life and is very active.  She has been on Stivarga with excellent tolerance.   We need to start looking for clinical trial options for her. In addition we can send [facility] testing if additional tumor is available, if not perhaps we can re-biospy. I did address this with her today. She seems amenable to the idea if needed.   Repeat imaging has been ordered in 4 weeks. She will return for follow-up post.   She was encouraged to be complaint with her current potassium prescription. She was advised to take an extra dose X 2 days then resume daily.   All questions were answered. The patient knows to call the clinic with any problems, questions or concerns.    This document serves as a record of services personally performed by Ancil Linsey, MD. It was created on her behalf by Arlyce Harman, a trained medical scribe. The creation of this record is based on the scribe's personal observations and the provider's statements to them. This document has been checked and approved by the attending provider.  I have reviewed the above documentation for accuracy and completeness, and I agree with the above.  Kelby Fam. Whitney Muse, MD

## 2015-11-24 LAB — CEA: CEA: 384.2 ng/mL — ABNORMAL HIGH (ref 0.0–4.7)

## 2015-11-27 ENCOUNTER — Other Ambulatory Visit (HOSPITAL_COMMUNITY): Payer: Self-pay | Admitting: Oncology

## 2015-11-27 DIAGNOSIS — C7800 Secondary malignant neoplasm of unspecified lung: Principal | ICD-10-CM

## 2015-11-27 DIAGNOSIS — C189 Malignant neoplasm of colon, unspecified: Secondary | ICD-10-CM

## 2015-11-27 MED ORDER — REGORAFENIB 40 MG PO TABS: 160.0000 mg | ORAL_TABLET | Freq: Every day | ORAL | Status: DC

## 2015-12-17 ENCOUNTER — Encounter (HOSPITAL_COMMUNITY)

## 2015-12-20 ENCOUNTER — Ambulatory Visit: Attending: Neurology | Admitting: Neurology

## 2015-12-20 DIAGNOSIS — Z7901 Long term (current) use of anticoagulants: Secondary | ICD-10-CM | POA: Insufficient documentation

## 2015-12-20 DIAGNOSIS — Z7984 Long term (current) use of oral hypoglycemic drugs: Secondary | ICD-10-CM | POA: Diagnosis not present

## 2015-12-20 DIAGNOSIS — Z79899 Other long term (current) drug therapy: Secondary | ICD-10-CM | POA: Diagnosis not present

## 2015-12-20 DIAGNOSIS — G4733 Obstructive sleep apnea (adult) (pediatric): Secondary | ICD-10-CM | POA: Insufficient documentation

## 2015-12-20 DIAGNOSIS — Z7982 Long term (current) use of aspirin: Secondary | ICD-10-CM | POA: Diagnosis not present

## 2015-12-20 DIAGNOSIS — R0683 Snoring: Secondary | ICD-10-CM | POA: Insufficient documentation

## 2015-12-23 ENCOUNTER — Observation Stay (HOSPITAL_COMMUNITY)
Admission: EM | Admit: 2015-12-23 | Discharge: 2015-12-25 | Disposition: A | Discharge status: Home / Self Care | Attending: Internal Medicine | Admitting: Internal Medicine

## 2015-12-23 ENCOUNTER — Emergency Department (HOSPITAL_COMMUNITY)

## 2015-12-23 ENCOUNTER — Encounter (HOSPITAL_COMMUNITY): Payer: Self-pay | Admitting: *Deleted

## 2015-12-23 DIAGNOSIS — C189 Malignant neoplasm of colon, unspecified: Secondary | ICD-10-CM | POA: Diagnosis not present

## 2015-12-23 DIAGNOSIS — C7801 Secondary malignant neoplasm of right lung: Secondary | ICD-10-CM | POA: Diagnosis not present

## 2015-12-23 DIAGNOSIS — C7800 Secondary malignant neoplasm of unspecified lung: Secondary | ICD-10-CM

## 2015-12-23 DIAGNOSIS — C787 Secondary malignant neoplasm of liver and intrahepatic bile duct: Secondary | ICD-10-CM | POA: Diagnosis not present

## 2015-12-23 DIAGNOSIS — E119 Type 2 diabetes mellitus without complications: Secondary | ICD-10-CM | POA: Insufficient documentation

## 2015-12-23 DIAGNOSIS — K625 Hemorrhage of anus and rectum: Principal | ICD-10-CM | POA: Insufficient documentation

## 2015-12-23 DIAGNOSIS — Z6841 Body Mass Index (BMI) 40.0 and over, adult: Secondary | ICD-10-CM | POA: Diagnosis not present

## 2015-12-23 DIAGNOSIS — C7802 Secondary malignant neoplasm of left lung: Secondary | ICD-10-CM | POA: Insufficient documentation

## 2015-12-23 DIAGNOSIS — Z7901 Long term (current) use of anticoagulants: Secondary | ICD-10-CM | POA: Insufficient documentation

## 2015-12-23 DIAGNOSIS — E1169 Type 2 diabetes mellitus with other specified complication: Secondary | ICD-10-CM

## 2015-12-23 DIAGNOSIS — I82701 Chronic embolism and thrombosis of unspecified veins of right upper extremity: Secondary | ICD-10-CM | POA: Diagnosis not present

## 2015-12-23 DIAGNOSIS — R109 Unspecified abdominal pain: Secondary | ICD-10-CM | POA: Diagnosis not present

## 2015-12-23 DIAGNOSIS — E669 Obesity, unspecified: Secondary | ICD-10-CM

## 2015-12-23 DIAGNOSIS — I10 Essential (primary) hypertension: Secondary | ICD-10-CM | POA: Diagnosis present

## 2015-12-23 DIAGNOSIS — Z79899 Other long term (current) drug therapy: Secondary | ICD-10-CM | POA: Diagnosis not present

## 2015-12-23 HISTORY — DX: Malignant (primary) neoplasm, unspecified: C80.1

## 2015-12-23 LAB — I-STAT CHEM 8, ED
BUN: 9 mg/dL (ref 6–20)
CREATININE: 0.9 mg/dL (ref 0.44–1.00)
Calcium, Ion: 1.14 mmol/L (ref 1.12–1.23)
Chloride: 101 mmol/L (ref 101–111)
Glucose, Bld: 174 mg/dL — ABNORMAL HIGH (ref 65–99)
HEMATOCRIT: 41 % (ref 36.0–46.0)
HEMOGLOBIN: 13.9 g/dL (ref 12.0–15.0)
POTASSIUM: 3.5 mmol/L (ref 3.5–5.1)
Sodium: 139 mmol/L (ref 135–145)
TCO2: 26 mmol/L (ref 0–100)

## 2015-12-23 LAB — CBC WITH DIFFERENTIAL/PLATELET
BASOS ABS: 0.1 10*3/uL (ref 0.0–0.1)
Basophils Relative: 0 %
Eosinophils Absolute: 0.4 10*3/uL (ref 0.0–0.7)
Eosinophils Relative: 2 %
HEMATOCRIT: 38.9 % (ref 36.0–46.0)
HEMOGLOBIN: 12.4 g/dL (ref 12.0–15.0)
LYMPHS PCT: 28 %
Lymphs Abs: 4.6 10*3/uL — ABNORMAL HIGH (ref 0.7–4.0)
MCH: 27.3 pg (ref 26.0–34.0)
MCHC: 31.9 g/dL (ref 30.0–36.0)
MCV: 85.5 fL (ref 78.0–100.0)
Monocytes Absolute: 1.2 10*3/uL — ABNORMAL HIGH (ref 0.1–1.0)
Monocytes Relative: 7 %
NEUTROS ABS: 10.3 10*3/uL — AB (ref 1.7–7.7)
NEUTROS PCT: 63 %
Platelets: 304 10*3/uL (ref 150–400)
RBC: 4.55 MIL/uL (ref 3.87–5.11)
RDW: 15.2 % (ref 11.5–15.5)
WBC: 16.5 10*3/uL — AB (ref 4.0–10.5)

## 2015-12-23 LAB — COMPREHENSIVE METABOLIC PANEL
ALT: 25 U/L (ref 14–54)
AST: 33 U/L (ref 15–41)
Albumin: 3.5 g/dL (ref 3.5–5.0)
Alkaline Phosphatase: 115 U/L (ref 38–126)
Anion gap: 9 (ref 5–15)
BILIRUBIN TOTAL: 0.4 mg/dL (ref 0.3–1.2)
BUN: 10 mg/dL (ref 6–20)
CHLORIDE: 102 mmol/L (ref 101–111)
CO2: 25 mmol/L (ref 22–32)
CREATININE: 0.85 mg/dL (ref 0.44–1.00)
Calcium: 9 mg/dL (ref 8.9–10.3)
GFR calc Af Amer: >60 mL/min (ref >60)
GLUCOSE: 169 mg/dL — AB (ref 65–99)
Potassium: 3.6 mmol/L (ref 3.5–5.1)
Sodium: 136 mmol/L (ref 135–145)
TOTAL PROTEIN: 8.1 g/dL (ref 6.5–8.1)

## 2015-12-23 MED ORDER — DIATRIZOATE MEGLUMINE & SODIUM 66-10 % PO SOLN
ORAL | Status: AC
  Administered 2015-12-23
  Filled 2015-12-23: qty 30

## 2015-12-23 MED ORDER — ONDANSETRON HCL 4 MG/2ML IJ SOLN
4.0000 mg | Freq: Once | INTRAMUSCULAR | Status: AC
  Administered 2015-12-23: 4 mg via INTRAVENOUS
  Filled 2015-12-23: qty 2

## 2015-12-23 MED ORDER — FAMOTIDINE IN NACL 20-0.9 MG/50ML-% IV SOLN
20.0000 mg | Freq: Once | INTRAVENOUS | Status: AC
  Administered 2015-12-23: 20 mg via INTRAVENOUS
  Filled 2015-12-23: qty 50

## 2015-12-23 MED ORDER — HYDROMORPHONE HCL 1 MG/ML IJ SOLN
0.5000 mg | Freq: Once | INTRAMUSCULAR | Status: AC
  Administered 2015-12-23: 0.5 mg via INTRAVENOUS
  Filled 2015-12-23: qty 1

## 2015-12-23 MED ORDER — IOPAMIDOL (ISOVUE-300) INJECTION 61%
100.0000 mL | Freq: Once | INTRAVENOUS | Status: AC | PRN
  Administered 2015-12-23: 100 mL via INTRAVENOUS

## 2015-12-23 NOTE — ED Notes (Signed)
Call to CT regarding when going to CT

## 2015-12-23 NOTE — ED Notes (Signed)
Port accessed, labs drawn to lab- sterile technique observed

## 2015-12-23 NOTE — ED Provider Notes (Signed)
CSN: [id number]     Arrival date & time 12/23/15  1936 History  By signing my name below, I, Erica Barker, attest that this documentation has been prepared under the direction and in the presence of Milton Ferguson, MD. Electronically Signed: Hansel Barker, ED Scribe. 12/23/2015. 8:55 PM.     Chief Complaint  Patient presents with  . rib cage pain   . Rectal Bleeding   Patient is a 55 y.o. female presenting with hematochezia. The history is provided by the patient. No language interpreter was used.  Rectal Bleeding Quality:  Bright red Amount:  Scant Duration:  1 day Timing:  Intermittent Progression:  Unchanged Chronicity:  New Context: defecation   Similar prior episodes: no   Relieved by:  None tried Worsened by:  Defecation Ineffective treatments:  None tried Associated symptoms: abdominal pain   Abdominal pain:    Location:  RUQ   Quality:  Sharp   Severity:  Moderate   Duration:  1 day Risk factors: hx of colorectal cancer    HPI Comments: Erica Barker is a 55 y.o. female with h/o stage IV metastatic colon cancer who presents to the Emergency Department complaining of moderate, sharp RUQ abdominal pain onset yesterday with associated episode of bright red blood in the stool. Per pt, her hematochezia occurred with defecation. No h/o similar hematochezia. Pt states that her abdominal pain is worsened with breathing and palpation. Pt states she is scheduled for CT A/P tomorrow with her oncologist. Pt has h/o cholecystectomy. She denies fever.   Past Medical History  Diagnosis Date  . Hypertension   . Depression   . Pulmonary nodules 01/30/14    CTA CHEST  . Lymphadenopathy, mediastinal 01/2014    [place]  . Morbid obesity ([place])   . Diabetes mellitus, type II ([place])   . Headache(784.0)   . Medically noncompliant 09/06/2015  . Cancer ([place])     stage 4 colon with mets to liver and lung   Past Surgical History  Procedure Laterality Date  .  Cholecystectomy    . Video bronchoscopy with endobronchial ultrasound N/A 02/06/2014    Procedure: VIDEO BRONCHOSCOPY WITH ENDOBRONCHIAL ULTRASOUND,bronchial biopsies , node sampling;  Surgeon: Melrose Nakayama, MD;  Location: [facility] OR;  Service: Thoracic;  Laterality: N/A;  . Tubal ligation     Family History  Problem Relation Age of Onset  . Cancer Mother     UTERINE  . Cancer Father     LEUKEMIA  . Crohn's disease Son    Social History  Substance Use Topics  . Smoking status: Former Smoker -- 0.50 packs/day for 7 years    Types: Cigarettes    Quit date: 02/03/1989  . Smokeless tobacco: Never Used  . Alcohol Use: Yes     Comment: occasional   OB History    No data available     Review of Systems  Constitutional: Negative for appetite change and fatigue.  HENT: Negative for congestion, ear discharge and sinus pressure.   Eyes: Negative for discharge.  Respiratory: Negative for cough.   Cardiovascular: Negative for chest pain.  Gastrointestinal: Positive for abdominal pain and hematochezia. Negative for diarrhea.  Genitourinary: Negative for frequency and hematuria.  Musculoskeletal: Negative for back pain.  Skin: Negative for rash.  Neurological: Negative for seizures and headaches.  Psychiatric/Behavioral: Negative for hallucinations.   Allergies  Asa  Home Medications   Prior to Admission medications   Medication Sig Start Date End Date Taking? Authorizing  Provider  aspirin-acetaminophen-caffeine (EXCEDRIN MIGRAINE) [id number] MG per tablet Take 2 tablets by mouth every 6 (six) hours as needed for headache.    Historical Provider, MD  baclofen (LIORESAL) 10 MG tablet Take 10 mg by mouth 2 (two) times daily as needed for muscle spasms.    Historical Provider, MD  dexlansoprazole (DEXILANT) 60 MG capsule Take 1 capsule (60 mg total) by mouth daily. 10/01/15   Baird Cancer, PA-C  Diphenhyd-Hydrocort-Nystatin (FIRST-DUKES MOUTHWASH) SUSP Use as directed 5 mLs in the  mouth or throat 4 (four) times daily as needed. 11/28/14   Baird Cancer, PA-C  diphenoxylate-atropine (LOMOTIL) 2.5-0.025 MG per tablet Take 1 tablet by mouth every 4 (four) hours as needed for diarrhea or loose stools. 04/04/15   Patrici Ranks, MD  escitalopram (LEXAPRO) 20 MG tablet Take 1/2 tablet by mouth daily for 5 days then increase to one tablet daily 01/16/15   Patrici Ranks, MD  gabapentin (NEURONTIN) 300 MG capsule Take 300 mg by mouth at bedtime.    Historical Provider, MD  lisinopril (PRINIVIL,ZESTRIL) 10 MG tablet Take 1 tablet (10 mg total) by mouth daily. 10/20/14   Patrici Ranks, MD  loperamide (IMODIUM) 2 MG capsule Take 2 caps after 1st loose stool then 1 cap every 2 hours until you go 12 hours without having a loose stool. At bedtime for diarrhea, take 2 caps then 2 caps every 4 hours until morning. 04/19/15   Patrici Ranks, MD  magic mouthwash SOLN Swish and swallow 22m by mouth every 4 hours as needed for mouth pain. 07/17/15   SPatrici Ranks MD  metFORMIN (GLUCOPHAGE) 500 MG tablet Take 1 tablet (500 mg total) by mouth daily with breakfast. 07/17/15   SPatrici Ranks MD  Multiple Vitamin (MULTIVITAMIN) capsule Take 1 capsule by mouth daily.    Historical Provider, MD  oxyCODONE-acetaminophen (PERCOCET/ROXICET) 5-325 MG tablet Take 1 tablet by mouth every 4 (four) hours as needed for severe pain. 11/09/15   SPatrici Ranks MD  potassium chloride SA (K-DUR,KLOR-CON) 20 MEQ tablet Take 2 tablets (40 mEq total) by mouth daily. 11/23/15   TBaird Cancer PA-C  promethazine (PHENERGAN) 25 MG suppository Place 1 suppository (25 mg total) rectally every 6 (six) hours as needed for nausea or vomiting. 10/13/14   TBaird Cancer PA-C  regorafenib (STIVARGA) 40 MG tablet Take 4 tablets (160 mg total) by mouth daily with breakfast. Take with low fat meal. Caution: Chemotherapy. 11/27/15   TBaird Cancer PA-C  rivaroxaban (XARELTO) 20 MG TABS tablet Take 1 tablet (20 mg  total) by mouth daily with supper. 07/17/15   SPatrici Ranks MD  sucralfate (CARAFATE) 1 GM/10ML suspension Take 10 mLs (1 g total) by mouth 4 (four) times daily -  with meals and at bedtime. 11/09/15   SPatrici Ranks MD   BP 157/93 mmHg  Pulse 97  Temp(Src) 99.3 F (37.4 C) (Oral)  Resp 18  Ht '5\' 4"'$  (1.626 m)  Wt 250 lb (113.399 kg)  BMI 42.89 kg/m2  SpO2 99%  LMP 10/12/2013 Physical Exam  Constitutional: She is oriented to person, place, and time. She appears well-developed.  HENT:  Head: Normocephalic.  Eyes: Conjunctivae and EOM are normal. No scleral icterus.  Neck: Neck supple. No thyromegaly present.  Cardiovascular: Normal rate and regular rhythm.  Exam reveals no gallop and no friction rub.   No murmur heard. Pulmonary/Chest: No stridor. She has no wheezes. She has no rales.  She exhibits no tenderness.  Abdominal: She exhibits no distension. There is tenderness (RUQ). There is no rebound.  Well healed cholecystectomy scar to the RUQ  Musculoskeletal: Normal range of motion. She exhibits no edema.  Lymphadenopathy:    She has no cervical adenopathy.  Neurological: She is oriented to person, place, and time. She exhibits normal muscle tone. Coordination normal.  Skin: No rash noted. No erythema.  Psychiatric: She has a normal mood and affect. Her behavior is normal.  Nursing note and vitals reviewed.   ED Course  Procedures (including critical care time) DIAGNOSTIC STUDIES: Oxygen Saturation is 99% on RA, normal by my interpretation.    COORDINATION OF CARE: 8:39 PM Discussed treatment plan with pt at bedside which includes lab work and pt agreed to plan.   Labs Review Labs Reviewed  CBC WITH DIFFERENTIAL/PLATELET  COMPREHENSIVE METABOLIC PANEL  I-STAT CHEM 8, ED    Imaging Review No results found. I have personally reviewed and evaluated these images and lab results as part of my medical decision-making.   EKG Interpretation None      MDM    Final diagnoses:  None   Patient with rectal bleeding and metastatic colon cancer. She will be admitted to medicine.  The chart was scribed for me under my direct supervision.  I personally performed the history, physical, and medical decision making and all procedures in the evaluation of this patient.Milton Ferguson, MD 12/26/15 [phone]

## 2015-12-23 NOTE — ED Notes (Addendum)
Pt states she is a stage 4 cancer pt that started in her colon that is now in her lungs and liver. Pt reports pain under her right rib cage that is worse when she takes a deep breath.  Pt also reports passing clots of blood in her stool today. Pt states the blood is bright red.

## 2015-12-23 NOTE — ED Notes (Signed)
To CT via wheelchair- Tech is informed of pt report of line can be positional

## 2015-12-23 NOTE — Procedures (Signed)
[place] A. Merlene Laughter, MD     [email or web address]             NOCTURNAL POLYSOMNOGRAPHY   LOCATION: [facility]   Patient Name: Erica Barker, Erica Barker Date: 12/20/2015 Gender: Female D.O.B: [birth year] Age (years): 79 Referring Provider: Not Available Height (inches): 64 Interpreting Physician: Phillips Odor MD, ABSM Weight (lbs): 240 RPSGT: Peak, Robert BMI: 41 MRN: [id number] Neck Size: 17.00 CLINICAL INFORMATION Sleep Study Type: NPSG Indication for sleep study: N/A Epworth Sleepiness Score: 15 SLEEP STUDY TECHNIQUE As per the AASM Manual for the Scoring of Sleep and Associated Events v2.3 (April 2016) with a hypopnea requiring 4% desaturations. The channels recorded and monitored were frontal, central and occipital EEG, electrooculogram (EOG), submentalis EMG (chin), nasal and oral airflow, thoracic and abdominal wall motion, anterior tibialis EMG, snore microphone, electrocardiogram, and pulse oximetry. MEDICATIONS Patient's medications include: N/A. Medications self-administered by patient during sleep study : No sleep medicine administered.  Current outpatient prescriptions:  .  aspirin-acetaminophen-caffeine (EXCEDRIN MIGRAINE) 250-250-65 MG per tablet, Take 2 tablets by mouth every 6 (six) hours as needed for headache., Disp: , Rfl:  .  baclofen (LIORESAL) 10 MG tablet, Take 10 mg by mouth 2 (two) times daily as needed for muscle spasms., Disp: , Rfl:  .  dexlansoprazole (DEXILANT) 60 MG capsule, Take 1 capsule (60 mg total) by mouth daily., Disp: 30 capsule, Rfl: 5 .  Diphenhyd-Hydrocort-Nystatin (FIRST-DUKES MOUTHWASH) SUSP, Use as directed 5 mLs in the mouth or throat 4 (four) times daily as needed., Disp: 300 mL, Rfl: 2 .  diphenoxylate-atropine (LOMOTIL) 2.5-0.025 MG per tablet, Take 1 tablet by mouth every 4 (four) hours as needed for diarrhea or loose stools., Disp: 45 tablet, Rfl: 0 .  escitalopram (LEXAPRO) 20 MG tablet, Take 1/2 tablet by  mouth daily for 5 days then increase to one tablet daily, Disp: 30 tablet, Rfl: 6 .  gabapentin (NEURONTIN) 300 MG capsule, Take 300 mg by mouth at bedtime., Disp: , Rfl:  .  lisinopril (PRINIVIL,ZESTRIL) 10 MG tablet, Take 1 tablet (10 mg total) by mouth daily., Disp: 30 tablet, Rfl: 6 .  loperamide (IMODIUM) 2 MG capsule, Take 2 caps after 1st loose stool then 1 cap every 2 hours until you go 12 hours without having a loose stool. At bedtime for diarrhea, take 2 caps then 2 caps every 4 hours until morning., Disp: 60 capsule, Rfl: 0 .  magic mouthwash SOLN, Swish and swallow 55m by mouth every 4 hours as needed for mouth pain., Disp: 360 mL, Rfl: 3 .  metFORMIN (GLUCOPHAGE) 500 MG tablet, Take 1 tablet (500 mg total) by mouth daily with breakfast., Disp: 30 tablet, Rfl: 3 .  Multiple Vitamin (MULTIVITAMIN) capsule, Take 1 capsule by mouth daily., Disp: , Rfl:  .  oxyCODONE-acetaminophen (PERCOCET/ROXICET) 5-325 MG tablet, Take 1 tablet by mouth every 4 (four) hours as needed for severe pain., Disp: 60 tablet, Rfl: 0 .  potassium chloride SA (K-DUR,KLOR-CON) 20 MEQ tablet, Take 2 tablets (40 mEq total) by mouth daily., Disp: 60 tablet, Rfl: 0 .  promethazine (PHENERGAN) 25 MG suppository, Place 1 suppository (25 mg total) rectally every 6 (six) hours as needed for nausea or vomiting., Disp: 12 each, Rfl: 0 .  regorafenib (STIVARGA) 40 MG tablet, Take 4 tablets (160 mg total) by mouth daily with breakfast. Take with low fat meal. Caution: Chemotherapy., Disp: 84 tablet, Rfl: 0 .  rivaroxaban (XARELTO) 20 MG TABS tablet, Take 1 tablet (20 mg total) by mouth daily  with supper., Disp: 30 tablet, Rfl: 3 .  sucralfate (CARAFATE) 1 GM/10ML suspension, Take 10 mLs (1 g total) by mouth 4 (four) times daily -  with meals and at bedtime., Disp: 420 mL, Rfl: 1  SLEEP ARCHITECTURE The study was initiated at 10:59:06 PM and ended at 5:06:39 AM. Sleep onset time was 55.1 minutes and the sleep efficiency was 60.9%.  The total sleep time was 224.0 minutes. Stage REM latency was 73.5 minutes. The patient spent 4.24% of the night in stage N1 sleep, 75.67% in stage N2 sleep, 2.68% in stage N3 and 17.41% in REM. Alpha intrusion was absent. Supine sleep was 0.22%. RESPIRATORY PARAMETERS The overall apnea/hypopnea index (AHI) was 11.0 per hour. There were 0 total apneas, including 0 obstructive, 0 central and 0 mixed apneas. There were 41 hypopneas and 4 RERAs. The AHI during Stage REM sleep was 43.1 per hour. AHI while supine was 120.0 per hour. The mean oxygen saturation was 96.64%. The minimum SpO2 during sleep was 89.00%. Moderate snoring was noted during this study. CARDIAC DATA The 2 lead EKG demonstrated sinus rhythm. The mean heart rate was 82.46 beats per minute. Other EKG findings include: None. LEG MOVEMENT DATA The total PLMS were 0 with a resulting PLMS index of 0.00. Associated arousal with leg movement index was 0.0.    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 Suggest trial of AutoPAP 8-15. - Severe significant periodic limb movements did not occur during sleep. Consider trial of dopamine agonist such as requip or mirapex at bedtime   Delano Metz, MD Diplomate, American Board of Sleep Medicine.

## 2015-12-24 ENCOUNTER — Ambulatory Visit (HOSPITAL_COMMUNITY)

## 2015-12-24 ENCOUNTER — Other Ambulatory Visit (HOSPITAL_COMMUNITY): Payer: Self-pay | Admitting: Oncology

## 2015-12-24 ENCOUNTER — Encounter (HOSPITAL_COMMUNITY): Payer: Self-pay | Admitting: Internal Medicine

## 2015-12-24 DIAGNOSIS — I10 Essential (primary) hypertension: Secondary | ICD-10-CM | POA: Diagnosis not present

## 2015-12-24 DIAGNOSIS — E669 Obesity, unspecified: Secondary | ICD-10-CM | POA: Diagnosis not present

## 2015-12-24 DIAGNOSIS — K625 Hemorrhage of anus and rectum: Secondary | ICD-10-CM | POA: Diagnosis not present

## 2015-12-24 DIAGNOSIS — C787 Secondary malignant neoplasm of liver and intrahepatic bile duct: Secondary | ICD-10-CM

## 2015-12-24 DIAGNOSIS — C189 Malignant neoplasm of colon, unspecified: Secondary | ICD-10-CM | POA: Insufficient documentation

## 2015-12-24 DIAGNOSIS — C7800 Secondary malignant neoplasm of unspecified lung: Secondary | ICD-10-CM

## 2015-12-24 DIAGNOSIS — E119 Type 2 diabetes mellitus without complications: Secondary | ICD-10-CM | POA: Diagnosis not present

## 2015-12-24 DIAGNOSIS — I82401 Acute embolism and thrombosis of unspecified deep veins of right lower extremity: Secondary | ICD-10-CM

## 2015-12-24 LAB — COMPREHENSIVE METABOLIC PANEL
ALT: 22 U/L (ref 14–54)
AST: 24 U/L (ref 15–41)
Albumin: 3.3 g/dL — ABNORMAL LOW (ref 3.5–5.0)
Alkaline Phosphatase: 108 U/L (ref 38–126)
Anion gap: 8 (ref 5–15)
BUN: 9 mg/dL (ref 6–20)
CHLORIDE: 102 mmol/L (ref 101–111)
CO2: 24 mmol/L (ref 22–32)
CREATININE: 0.64 mg/dL (ref 0.44–1.00)
Calcium: 8.6 mg/dL — ABNORMAL LOW (ref 8.9–10.3)
Glucose, Bld: 216 mg/dL — ABNORMAL HIGH (ref 65–99)
POTASSIUM: 3.5 mmol/L (ref 3.5–5.1)
SODIUM: 134 mmol/L — AB (ref 135–145)
Total Bilirubin: 0.5 mg/dL (ref 0.3–1.2)
Total Protein: 7.6 g/dL (ref 6.5–8.1)

## 2015-12-24 LAB — CBC
HCT: 37.2 % (ref 36.0–46.0)
HEMATOCRIT: 38.7 % (ref 36.0–46.0)
HEMOGLOBIN: 11.6 g/dL — AB (ref 12.0–15.0)
HEMOGLOBIN: 12.1 g/dL (ref 12.0–15.0)
MCH: 26.9 pg (ref 26.0–34.0)
MCH: 28 pg (ref 26.0–34.0)
MCHC: 31.2 g/dL (ref 30.0–36.0)
MCHC: 31.3 g/dL (ref 30.0–36.0)
MCV: 86.1 fL (ref 78.0–100.0)
MCV: 89.6 fL (ref 78.0–100.0)
PLATELETS: 273 10*3/uL (ref 150–400)
Platelets: 296 10*3/uL (ref 150–400)
RBC: 4.32 MIL/uL (ref 3.87–5.11)
RBC: 4.32 MIL/uL (ref 3.87–5.11)
RDW: 15 % (ref 11.5–15.5)
RDW: 15.7 % — AB (ref 11.5–15.5)
WBC: 15 10*3/uL — AB (ref 4.0–10.5)
WBC: 14.7 10*3/uL — AB (ref 4.0–10.5)

## 2015-12-24 LAB — GLUCOSE, CAPILLARY
GLUCOSE-CAPILLARY: 217 mg/dL — AB (ref 65–99)
GLUCOSE-CAPILLARY: 162 mg/dL — AB (ref 65–99)
GLUCOSE-CAPILLARY: 147 mg/dL — AB (ref 65–99)
Glucose-Capillary: 255 mg/dL — ABNORMAL HIGH (ref 65–99)
Glucose-Capillary: 189 mg/dL — ABNORMAL HIGH (ref 65–99)

## 2015-12-24 LAB — MRSA PCR SCREENING: MRSA BY PCR: INVALID — AB

## 2015-12-24 LAB — TYPE AND SCREEN
ABO/RH(D): O POS
Antibody Screen: NEGATIVE

## 2015-12-24 LAB — PROTIME-INR
INR: 1.59 — AB (ref 0.00–1.49)
PROTHROMBIN TIME: 19 s — AB (ref 11.6–15.2)

## 2015-12-24 LAB — APTT: APTT: 32 s (ref 24–37)

## 2015-12-24 MED ORDER — SODIUM CHLORIDE 0.9 % IV SOLN
INTRAVENOUS | Status: AC
  Administered 2015-12-24: 07:00:00 via INTRAVENOUS

## 2015-12-24 MED ORDER — SODIUM CHLORIDE 0.9 % IV SOLN
INTRAVENOUS | Status: DC
  Administered 2015-12-24: 02:00:00 via INTRAVENOUS

## 2015-12-24 MED ORDER — HYDRALAZINE HCL 20 MG/ML IJ SOLN: 10.0000 mg | INTRAMUSCULAR | Status: DC | PRN

## 2015-12-24 MED ORDER — ACETAMINOPHEN 325 MG PO TABS
650.0000 mg | ORAL_TABLET | Freq: Four times a day (QID) | ORAL | Status: DC | PRN
  Administered 2015-12-24 (×2): 650 mg via ORAL
  Filled 2015-12-24 (×2): qty 2

## 2015-12-24 MED ORDER — GABAPENTIN 300 MG PO CAPS
300.0000 mg | ORAL_CAPSULE | Freq: Every day | ORAL | Status: DC
  Administered 2015-12-24: 300 mg via ORAL
  Filled 2015-12-24: qty 1

## 2015-12-24 MED ORDER — INSULIN ASPART 100 UNIT/ML ~~LOC~~ SOLN
0.0000 [IU] | SUBCUTANEOUS | Status: DC
  Administered 2015-12-24 (×2): 2 [IU] via SUBCUTANEOUS
  Administered 2015-12-24: 3 [IU] via SUBCUTANEOUS
  Administered 2015-12-24: 5 [IU] via SUBCUTANEOUS
  Administered 2015-12-24: 1 [IU] via SUBCUTANEOUS
  Administered 2015-12-25 (×3): 2 [IU] via SUBCUTANEOUS

## 2015-12-24 MED ORDER — ONDANSETRON HCL 4 MG/2ML IJ SOLN: 4.0000 mg | Freq: Four times a day (QID) | INTRAMUSCULAR | Status: DC | PRN

## 2015-12-24 MED ORDER — MORPHINE SULFATE (PF) 2 MG/ML IV SOLN
2.0000 mg | INTRAVENOUS | Status: DC | PRN
Start: 2015-12-24 — End: 2015-12-25

## 2015-12-24 MED ORDER — FAMOTIDINE IN NACL 20-0.9 MG/50ML-% IV SOLN
20.0000 mg | Freq: Two times a day (BID) | INTRAVENOUS | Status: DC
  Administered 2015-12-24 (×3): 20 mg via INTRAVENOUS
  Filled 2015-12-24 (×5): qty 50

## 2015-12-24 MED ORDER — BACLOFEN 10 MG PO TABS
10.0000 mg | ORAL_TABLET | Freq: Two times a day (BID) | ORAL | Status: DC | PRN
  Filled 2015-12-24: qty 1

## 2015-12-24 MED ORDER — ONDANSETRON HCL 4 MG PO TABS: 4.0000 mg | ORAL_TABLET | Freq: Four times a day (QID) | ORAL | Status: DC | PRN

## 2015-12-24 MED ORDER — SUCRALFATE 1 GM/10ML PO SUSP
1.0000 g | Freq: Three times a day (TID) | ORAL | Status: DC
  Administered 2015-12-24 – 2015-12-25 (×5): 1 g via ORAL
  Filled 2015-12-24 (×5): qty 10

## 2015-12-24 MED ORDER — ACETAMINOPHEN 650 MG RE SUPP: 650.0000 mg | Freq: Four times a day (QID) | RECTAL | Status: DC | PRN

## 2015-12-24 NOTE — ED Notes (Signed)
Report to Janett Billow, RN- She is aware of pt reluctance to have post reaccessed, blood draw from periphery, and difficulty to obtain blood

## 2015-12-24 NOTE — Consult Note (Signed)
Reason for Consult: Blood per rectum Referring Physician: Dr. [name] is an 55 y.o. female.  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 She states she is also taking this due to her usage of a Port-A-Cath. She states that over the last 48 hours, she has passed clots of blood. They have turned the toilet bowl red. She denies any rectal pain, constipation, nausea, vomiting, or lightheadedness. She is hungry.  Past Medical History  Diagnosis Date  . Hypertension   . Depression   . Pulmonary nodules 01/30/14    CTA CHEST  . Lymphadenopathy, mediastinal 01/2014    [place]  . Morbid obesity ([place])   . Diabetes mellitus, type II ([place])   . Headache(784.0)   . Medically noncompliant 09/06/2015  . Cancer ([place])     stage 4 colon with mets to liver and lung    Past Surgical History  Procedure Laterality Date  . Cholecystectomy    . Video bronchoscopy with endobronchial ultrasound N/A 02/06/2014    Procedure: VIDEO BRONCHOSCOPY WITH ENDOBRONCHIAL ULTRASOUND,bronchial biopsies , node sampling;  Surgeon: Melrose Nakayama, MD;  Location: [facility] OR;  Service: Thoracic;  Laterality: N/A;  . Tubal ligation      Family History  Problem Relation Age of Onset  . Cancer Mother     UTERINE  . Cancer Father     LEUKEMIA  . Crohn's disease Son   . Colon cancer Neg Hx     Social History:  reports that she quit smoking about 26 years ago. Her smoking use included Cigarettes. She has a 3.5 pack-year smoking history. She has never used smokeless tobacco. She reports that she drinks alcohol. She reports that she does not use illicit drugs.  Allergies:  Allergies  Allergen Reactions  . Asa [Aspirin] Hives    Medications:  Prior to Admission:  Prescriptions prior  to admission  Medication Sig Dispense Refill Last Dose  . aspirin-acetaminophen-caffeine (EXCEDRIN MIGRAINE) 250-250-65 MG per tablet Take 2 tablets by mouth every 6 (six) hours as needed for headache.   12/23/2015 at Unknown time  . baclofen (LIORESAL) 10 MG tablet Take 10 mg by mouth 2 (two) times daily as needed for muscle spasms.   Past Week at Unknown time  . dexlansoprazole (DEXILANT) 60 MG capsule Take 1 capsule (60 mg total) by mouth daily. 30 capsule 5 12/20/2015 at Unknown time  . gabapentin (NEURONTIN) 300 MG capsule Take 300 mg by mouth at bedtime.   12/20/2015 at Unknown time  . lisinopril (PRINIVIL,ZESTRIL) 10 MG tablet Take 1 tablet (10 mg total) by mouth daily. 30 tablet 6 12/20/2015 at Unknown time  . metFORMIN (GLUCOPHAGE) 500 MG tablet Take 1 tablet (500 mg total) by mouth daily with breakfast. 30 tablet 3 12/20/2015 at Unknown time  . Multiple Vitamin (MULTIVITAMIN) capsule Take 1 capsule by mouth daily.   12/20/2015 at Unknown time  . oxyCODONE-acetaminophen (PERCOCET/ROXICET) 5-325 MG tablet Take 1 tablet by mouth every 4 (four) hours as needed for severe pain. 60 tablet 0 Past Month at Unknown time  . potassium chloride SA (K-DUR,KLOR-CON) 20 MEQ tablet Take 2 tablets (40 mEq total) by mouth daily. 60 tablet 0 12/20/2015 at Unknown time  . regorafenib (STIVARGA) 40 MG tablet Take 4 tablets (160  mg total) by mouth daily with breakfast. Take with low fat meal. Caution: Chemotherapy. 84 tablet 0 12/20/2015 at Unknown time  . rivaroxaban (XARELTO) 20 MG TABS tablet Take 1 tablet (20 mg total) by mouth daily with supper. 30 tablet 3 12/23/2015 at 1500  . sucralfate (CARAFATE) 1 GM/10ML suspension Take 10 mLs (1 g total) by mouth 4 (four) times daily -  with meals and at bedtime. 420 mL 1 12/20/2015 at Unknown time   Scheduled: . famotidine (PEPCID) IV  20 mg Intravenous Q12H  . gabapentin  300 mg Oral QHS  . insulin aspart  0-9 Units Subcutaneous Q4H  . sucralfate  1 g Oral TID WC & HS     Results for orders placed or performed during the hospital encounter of 12/23/15 (from the past 48 hour(s))  CBC with Differential/Platelet     Status: Abnormal   Collection Time: 12/23/15  9:00 PM  Result Value Ref Range   WBC 16.5 (H) 4.0 - 10.5 K/uL   RBC 4.55 3.87 - 5.11 MIL/uL   Hemoglobin 12.4 12.0 - 15.0 g/dL   HCT 38.9 36.0 - 46.0 %   MCV 85.5 78.0 - 100.0 fL   MCH 27.3 26.0 - 34.0 pg   MCHC 31.9 30.0 - 36.0 g/dL   RDW 15.2 11.5 - 15.5 %   Platelets 304 150 - 400 K/uL   Neutrophils Relative % 63 %   Neutro Abs 10.3 (H) 1.7 - 7.7 K/uL   Lymphocytes Relative 28 %   Lymphs Abs 4.6 (H) 0.7 - 4.0 K/uL   Monocytes Relative 7 %   Monocytes Absolute 1.2 (H) 0.1 - 1.0 K/uL   Eosinophils Relative 2 %   Eosinophils Absolute 0.4 0.0 - 0.7 K/uL   Basophils Relative 0 %   Basophils Absolute 0.1 0.0 - 0.1 K/uL  Comprehensive metabolic panel     Status: Abnormal   Collection Time: 12/23/15  9:00 PM  Result Value Ref Range   Sodium 136 135 - 145 mmol/L   Potassium 3.6 3.5 - 5.1 mmol/L   Chloride 102 101 - 111 mmol/L   CO2 25 22 - 32 mmol/L   Glucose, Bld 169 (H) 65 - 99 mg/dL   BUN 10 6 - 20 mg/dL   Creatinine, Ser 0.85 0.44 - 1.00 mg/dL   Calcium 9.0 8.9 - 10.3 mg/dL   Total Protein 8.1 6.5 - 8.1 g/dL   Albumin 3.5 3.5 - 5.0 g/dL   AST 33 15 - 41 U/L   ALT 25 14 - 54 U/L   Alkaline Phosphatase 115 38 - 126 U/L   Total Bilirubin 0.4 0.3 - 1.2 mg/dL   GFR calc non Af Amer >60 >60 mL/min   GFR calc Af Amer >60 >60 mL/min    Comment: (NOTE) The eGFR has been calculated using the CKD EPI equation. This calculation has not been validated in all clinical situations. eGFR's persistently <60 mL/min signify possible Chronic Kidney Disease.    Anion gap 9 5 - 15  I-stat chem 8, ed     Status: Abnormal   Collection Time: 12/23/15  9:20 PM  Result Value Ref Range   Sodium 139 135 - 145 mmol/L   Potassium 3.5 3.5 - 5.1 mmol/L   Chloride 101 101 - 111 mmol/L   BUN 9 6 - 20 mg/dL    Creatinine, Ser 0.90 0.44 - 1.00 mg/dL   Glucose, Bld 174 (H) 65 - 99 mg/dL   Calcium, Ion 1.14 1.12 -  1.23 mmol/L   TCO2 26 0 - 100 mmol/L   Hemoglobin 13.9 12.0 - 15.0 g/dL   HCT 41.0 36.0 - 46.0 %  MRSA PCR Screening     Status: Abnormal   Collection Time: 12/24/15  3:28 AM  Result Value Ref Range   MRSA by PCR INVALID RESULTS, SPECIMEN SENT FOR CULTURE (A) NEGATIVE    Comment: HEITGER J. AT [id number] ON [id number] BY THOMPSON S.        The GeneXpert MRSA Assay (FDA approved for NASAL specimens only), is one component of a comprehensive MRSA colonization surveillance program. It is not intended to diagnose MRSA infection nor to guide or monitor treatment for MRSA infections.   Glucose, capillary     Status: Abnormal   Collection Time: 12/24/15  3:43 AM  Result Value Ref Range   Glucose-Capillary 217 (H) 65 - 99 mg/dL   Comment 1 Notify RN   APTT     Status: None   Collection Time: 12/24/15  4:05 AM  Result Value Ref Range   aPTT 32 24 - 37 seconds  Protime-INR     Status: Abnormal   Collection Time: 12/24/15  4:05 AM  Result Value Ref Range   Prothrombin Time 19.0 (H) 11.6 - 15.2 seconds   INR 1.59 (H) 0.00 - 1.49  Type and screen     Status: None   Collection Time: 12/24/15  4:05 AM  Result Value Ref Range   ABO/RH(D) O POS    Antibody Screen NEG    Sample Expiration 12/27/2015   Comprehensive metabolic panel     Status: Abnormal   Collection Time: 12/24/15  4:05 AM  Result Value Ref Range   Sodium 134 (L) 135 - 145 mmol/L   Potassium 3.5 3.5 - 5.1 mmol/L   Chloride 102 101 - 111 mmol/L   CO2 24 22 - 32 mmol/L   Glucose, Bld 216 (H) 65 - 99 mg/dL   BUN 9 6 - 20 mg/dL   Creatinine, Ser 0.64 0.44 - 1.00 mg/dL   Calcium 8.6 (L) 8.9 - 10.3 mg/dL   Total Protein 7.6 6.5 - 8.1 g/dL   Albumin 3.3 (L) 3.5 - 5.0 g/dL   AST 24 15 - 41 U/L   ALT 22 14 - 54 U/L   Alkaline Phosphatase 108 38 - 126 U/L   Total Bilirubin 0.5 0.3 - 1.2 mg/dL   GFR calc non Af Amer >60 >60  mL/min   GFR calc Af Amer >60 >60 mL/min    Comment: (NOTE) The eGFR has been calculated using the CKD EPI equation. This calculation has not been validated in all clinical situations. eGFR's persistently <60 mL/min signify possible Chronic Kidney Disease.    Anion gap 8 5 - 15  CBC     Status: Abnormal   Collection Time: 12/24/15  4:05 AM  Result Value Ref Range   WBC 15.0 (H) 4.0 - 10.5 K/uL   RBC 4.32 3.87 - 5.11 MIL/uL   Hemoglobin 11.6 (L) 12.0 - 15.0 g/dL   HCT 37.2 36.0 - 46.0 %   MCV 86.1 78.0 - 100.0 fL   MCH 26.9 26.0 - 34.0 pg   MCHC 31.2 30.0 - 36.0 g/dL   RDW 15.0 11.5 - 15.5 %   Platelets 273 150 - 400 K/uL  Glucose, capillary     Status: Abnormal   Collection Time: 12/24/15  7:17 AM  Result Value Ref Range   Glucose-Capillary 255 (H) 65 - 99 mg/dL  CBC     Status: Abnormal   Collection Time: 12/24/15  8:21 AM  Result Value Ref Range   WBC 13.9 (H) 4.0 - 10.5 K/uL   RBC 4.16 3.87 - 5.11 MIL/uL   Hemoglobin 11.5 (L) 12.0 - 15.0 g/dL   HCT 35.6 (L) 36.0 - 46.0 %   MCV 85.6 78.0 - 100.0 fL   MCH 27.6 26.0 - 34.0 pg   MCHC 32.3 30.0 - 36.0 g/dL   RDW 15.2 11.5 - 15.5 %   Platelets 283 150 - 400 K/uL  Glucose, capillary     Status: Abnormal   Collection Time: 12/24/15 11:13 AM  Result Value Ref Range   Glucose-Capillary 162 (H) 65 - 99 mg/dL   Comment 1 Notify RN    Comment 2 Document in Chart     Ct Head Wo Contrast  12/24/2015  CLINICAL DATA:  History of stage IV metastatic cancer. Right chest pain and blood in stools. Headache. EXAM: CT HEAD WITHOUT CONTRAST TECHNIQUE: Contiguous axial images were obtained from the base of the skull through the vertex without intravenous contrast. COMPARISON:  MRI brain 08/10/2015.  CT head 02/26/2015. FINDINGS: Ventricles and sulci appear symmetrical. No ventricular dilatation. No mass effect or midline shift. No abnormal extra-axial fluid collections. Gray-white matter junctions are distinct. Basal cisterns are not effaced.  No evidence of acute intracranial hemorrhage. No depressed skull fractures. Visualized paranasal sinuses and mastoid air cells are not opacified. If there is suspicion for intracranial metastasis, contrast-enhanced MRI would be more sensitive for detection of small metastatic lesions. IMPRESSION: No acute intracranial abnormalities. No significant mass effect or focal lesion identified. Electronically Signed   By: Lucienne Capers M.D.   On: 12/24/2015 00:01   Ct Chest W Contrast  12/24/2015  CLINICAL DATA:  Stage IV colon cancer metastatic to lungs and liver. Right rib cage pain. Blood in stools. EXAM: CT CHEST, ABDOMEN, AND PELVIS WITH CONTRAST TECHNIQUE: Multidetector CT imaging of the chest, abdomen and pelvis was performed following the standard protocol during bolus administration of intravenous contrast. CONTRAST:  167m ISOVUE-300 IOPAMIDOL (ISOVUE-300) INJECTION 61% COMPARISON:  09/03/2015 FINDINGS: CT CHEST FINDINGS Mediastinum/Lymph Nodes: There is right pretracheal lymphadenopathy measuring 1.5 cm diameter, increasing since prior study. Subcarinal lymphadenopathy measures 1.4 cm, increasing since prior study. Normal heart size. Normal caliber thoracic aorta. Esophagus is decompressed. Lungs/Pleura: Multiple pulmonary metastases diffusely throughout both lungs. Largest lesion is in the right lung base posteriorly and measures 5.9 by 4.9 cm, increasing since previous study. No pleural effusions. No pneumothorax. Musculoskeletal: No chest wall mass or suspicious bone lesions identified. CT ABDOMEN PELVIS FINDINGS Hepatobiliary: Multiple low-attenuation metastases throughout the liver. The lesion previously measured in the dome of the liver today measures 4.8 x 3.6 cm, representing significant increase in size. Previously measured lesion in the inferior right lobe of the liver today measures 3.8 by 2.7 cm, representing significant increase. Previous lesion at the junction of segments 4 and 8 today  measures 3.3 x 3.4 cm, representing significant increase in size. The gallbladder is either contracted or surgically absent. No bile duct dilatation. Pancreas: No mass, inflammatory changes, or other significant abnormality. Spleen: Within normal limits in size and appearance. Adrenals/Urinary Tract: No masses identified. No evidence of hydronephrosis. Stomach/Bowel: No evidence of obstruction, inflammatory process, or abnormal fluid collections.There is a circumferential mass in the mid sigmoid colon, more prominent than on prior study. There is regional lymphadenopathy adjacent to the sigmoid colon measuring up to 2.1 cm in diameter, increasing  since previous study. Vascular/Lymphatic: No pathologically enlarged lymph nodes. No evidence of abdominal aortic aneurysm. Reproductive: Multiple uterine masses are likely to represent fibroids. No abnormal adnexal masses. Other: None. Musculoskeletal:  No suspicious bone lesions identified. IMPRESSION: Chest: Significant progression of multiple bilateral pulmonary metastases. Mild progression of mediastinal lymphadenopathy. Abdomen:  Significant progression of multiple hepatic metastases. Pelvis: Mass in the sigmoid colon, increasing in prominence since previous study. Increasing regional lymphadenopathy around the sigmoid colon. Electronically Signed   By: Lucienne Capers M.D.   On: 12/24/2015 00:12   Ct Abdomen Pelvis W Contrast  12/24/2015  CLINICAL DATA:  Stage IV colon cancer metastatic to lungs and liver. Right rib cage pain. Blood in stools. EXAM: CT CHEST, ABDOMEN, AND PELVIS WITH CONTRAST TECHNIQUE: Multidetector CT imaging of the chest, abdomen and pelvis was performed following the standard protocol during bolus administration of intravenous contrast. CONTRAST:  160m ISOVUE-300 IOPAMIDOL (ISOVUE-300) INJECTION 61% COMPARISON:  09/03/2015 FINDINGS: CT CHEST FINDINGS Mediastinum/Lymph Nodes: There is right pretracheal lymphadenopathy measuring 1.5 cm  diameter, increasing since prior study. Subcarinal lymphadenopathy measures 1.4 cm, increasing since prior study. Normal heart size. Normal caliber thoracic aorta. Esophagus is decompressed. Lungs/Pleura: Multiple pulmonary metastases diffusely throughout both lungs. Largest lesion is in the right lung base posteriorly and measures 5.9 by 4.9 cm, increasing since previous study. No pleural effusions. No pneumothorax. Musculoskeletal: No chest wall mass or suspicious bone lesions identified. CT ABDOMEN PELVIS FINDINGS Hepatobiliary: Multiple low-attenuation metastases throughout the liver. The lesion previously measured in the dome of the liver today measures 4.8 x 3.6 cm, representing significant increase in size. Previously measured lesion in the inferior right lobe of the liver today measures 3.8 by 2.7 cm, representing significant increase. Previous lesion at the junction of segments 4 and 8 today measures 3.3 x 3.4 cm, representing significant increase in size. The gallbladder is either contracted or surgically absent. No bile duct dilatation. Pancreas: No mass, inflammatory changes, or other significant abnormality. Spleen: Within normal limits in size and appearance. Adrenals/Urinary Tract: No masses identified. No evidence of hydronephrosis. Stomach/Bowel: No evidence of obstruction, inflammatory process, or abnormal fluid collections.There is a circumferential mass in the mid sigmoid colon, more prominent than on prior study. There is regional lymphadenopathy adjacent to the sigmoid colon measuring up to 2.1 cm in diameter, increasing since previous study. Vascular/Lymphatic: No pathologically enlarged lymph nodes. No evidence of abdominal aortic aneurysm. Reproductive: Multiple uterine masses are likely to represent fibroids. No abnormal adnexal masses. Other: None. Musculoskeletal:  No suspicious bone lesions identified. IMPRESSION: Chest: Significant progression of multiple bilateral pulmonary metastases.  Mild progression of mediastinal lymphadenopathy. Abdomen:  Significant progression of multiple hepatic metastases. Pelvis: Mass in the sigmoid colon, increasing in prominence since previous study. Increasing regional lymphadenopathy around the sigmoid colon. Electronically Signed   By: WLucienne CapersM.D.   On: 12/24/2015 00:12    ROS:  A comprehensive review of systems was negative except for: Gastrointestinal: positive for Blood per rectum  Blood pressure 144/103, pulse 76, temperature 98.5 F (36.9 C), temperature source Oral, resp. rate 21, height '5\' 4"'$  (1.626 m), weight 111.2 kg (245 lb 2.4 oz), last menstrual period 10/12/2013, SpO2 98 %. Physical Exam: Well-developed well-nourished black female in no acute distress. HEENT examination reveals no scleral icterus. Normocephalic, atraumatic. Neck is supple without lymphadenopathy. Lungs clear to auscultation with breath sounds bilaterally. Heart examination reveals a regular rate and rhythm without S3, S4, murmurs. Abdomen is soft, nontender, nondistended. Active bowel sounds appreciated. No rigidity  noted. Rectal examination reveals minimal stool in the vault. No gross blood clots are noted. Normal sphincter tone noted.   Assessment/Plan: Impression: Blood per rectum while on Xarelto. This has been stopped. Hopefully this will decrease the blood in her stools. I did not appreciate a significant internal hemorrhoid to be the cause. Given her stage IV disease, I would like to see whether stopping the Xarelto will help resolve her rectal bleeding over the next 24-48 hours. It does not appear to be bright red blood per rectum. Will follow with you. Will start on clear liquid diet.  Erica Barker A 12/24/2015, 2:35 PM

## 2015-12-24 NOTE — H&P (Addendum)
History and Physical    Erica Barker TOI:[id number] DOB: 1960-11-16 DOA: 12/23/2015  PCP: No PCP Per Patient  Patient coming from: Home.  Chief Complaint: Rectal bleeding and abdominal pain.  HPI: Erica Barker is a 55 y.o. 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 Patient's symptoms started on 12/22/2015, 36 hours ago with a right upper quadrant pain and patient started noticing frank bleeding after each bowel movement. Has been having some nausea denies any vomiting chest pain or shortness of breath. Patient uses Excedrin for headache which also contains aspirin. Denies use of any other NSAIDs. Patient's last colonoscopy was 2 years ago probably at [facility], records of which are not available at this time. Patient is on Xarelto for right upper extremity DVT which was diagnosed in 2015. Patient at this time is hemodynamically stable. CT of the abdomen and pelvis chest and head was done which shows progression of patient's known metastasis in the lung and abdomen with pelvic mass. Patient has been admitted for further management of rectal bleeding and abdominal pain.   ED Course: Patient was started on IV fluids and pain medications and type and screen ordered.  Review of Systems: As per HPI, rest all negative.   Past Medical History  Diagnosis Date  . Hypertension   . Depression   . Pulmonary nodules 01/30/14    CTA CHEST  . Lymphadenopathy, mediastinal 01/2014    [place]  . Morbid obesity ([place])   . Diabetes mellitus, type II ([place])   . Headache(784.0)   . Medically noncompliant 09/06/2015  . Cancer ([place])     stage 4 colon with mets to liver and lung    Past Surgical History  Procedure Laterality Date  . Cholecystectomy    . Video bronchoscopy with endobronchial ultrasound N/A  02/06/2014    Procedure: VIDEO BRONCHOSCOPY WITH ENDOBRONCHIAL ULTRASOUND,bronchial biopsies , node sampling;  Surgeon: Melrose Nakayama, MD;  Location: [facility];  Service: Thoracic;  Laterality: N/A;  . Tubal ligation       reports that she quit smoking about 26 years ago. Her smoking use included Cigarettes. She has a 3.5 pack-year smoking history. She has never used smokeless tobacco. She reports that she drinks alcohol. She reports that she does not use illicit drugs.  Allergies  Allergen Reactions  . Asa [Aspirin] Hives    Family History  Problem Relation Age of Onset  . Cancer Mother     UTERINE  . Cancer Father     LEUKEMIA  . Crohn's disease Son     Prior to Admission medications   Medication Sig Start Date End Date Taking? Authorizing Provider  aspirin-acetaminophen-caffeine (EXCEDRIN MIGRAINE) [phone] MG per tablet Take 2 tablets by mouth every 6 (six) hours as needed for headache.   Yes Historical Provider, MD  baclofen (LIORESAL) 10 MG tablet Take 10 mg by mouth 2 (two) times daily as needed for muscle spasms.   Yes Historical Provider, MD  dexlansoprazole (DEXILANT) 60 MG capsule Take 1 capsule (60 mg total) by mouth daily. 10/01/15  Yes Manon Hilding Kefalas, PA-C  gabapentin (NEURONTIN) 300 MG capsule Take 300 mg by mouth at bedtime.   Yes Historical Provider, MD  lisinopril (PRINIVIL,ZESTRIL) 10 MG tablet Take 1 tablet (10 mg total) by mouth daily. 10/20/14  Yes Patrici Ranks, MD  metFORMIN (GLUCOPHAGE) 500  MG tablet Take 1 tablet (500 mg total) by mouth daily with breakfast. 07/17/15  Yes Patrici Ranks, MD  Multiple Vitamin (MULTIVITAMIN) capsule Take 1 capsule by mouth daily.   Yes Historical Provider, MD  oxyCODONE-acetaminophen (PERCOCET/ROXICET) 5-325 MG tablet Take 1 tablet by mouth every 4 (four) hours as needed for severe pain. 11/09/15  Yes Patrici Ranks, MD  potassium chloride SA (K-DUR,KLOR-CON) 20 MEQ tablet Take 2 tablets (40 mEq total) by mouth daily.  11/23/15  Yes Baird Cancer, PA-C  regorafenib (STIVARGA) 40 MG tablet Take 4 tablets (160 mg total) by mouth daily with breakfast. Take with low fat meal. Caution: Chemotherapy. 11/27/15  Yes Baird Cancer, PA-C  rivaroxaban (XARELTO) 20 MG TABS tablet Take 1 tablet (20 mg total) by mouth daily with supper. 07/17/15  Yes Patrici Ranks, MD  sucralfate (CARAFATE) 1 GM/10ML suspension Take 10 mLs (1 g total) by mouth 4 (four) times daily -  with meals and at bedtime. 11/09/15  Yes Patrici Ranks, MD    Physical Exam: Filed Vitals:   12/24/15 0048 12/24/15 0100 12/24/15 0130 12/24/15 0200  BP: 129/92 140/102 148/101 141/98  Pulse: 88 97 90 89  Temp:      TempSrc:      Resp: 20     Height:      Weight:      SpO2: 96% 93% 100% 99%      Constitutional: Not in distress. Filed Vitals:   12/24/15 0048 12/24/15 0100 12/24/15 0130 12/24/15 0200  BP: 129/92 140/102 148/101 141/98  Pulse: 88 97 90 89  Temp:      TempSrc:      Resp: 20     Height:      Weight:      SpO2: 96% 93% 100% 99%   Eyes: Anicteric. No pallor. ENMT: No discharge from the ears eyes nose and mouth. Neck: No mass felt. No JVD appreciated. Respiratory: No rhonchi or crepitations. Cardiovascular: S1 and S2 heard. Abdomen: Soft mild tenderness in the right upper quadrant no guarding or rigidity. Musculoskeletal: No edema. Skin: No rash. Neurologic: Alert awake oriented to time place and person. Moves all extremities. Psychiatric: Appears normal.   Labs on Admission: I have personally reviewed following labs and imaging studies  CBC:  Recent Labs Lab 12/23/15 2100 12/23/15 2120  WBC 16.5*  --   NEUTROABS 10.3*  --   HGB 12.4 13.9  HCT 38.9 41.0  MCV 85.5  --   PLT 304  --    Basic Metabolic Panel:  Recent Labs Lab 12/23/15 2100 12/23/15 2120  NA 136 139  K 3.6 3.5  CL 102 101  CO2 25  --   GLUCOSE 169* 174*  BUN 10 9  CREATININE 0.85 0.90  CALCIUM 9.0  --    GFR: Estimated  Creatinine Clearance: 89.2 mL/min (by C-G formula based on Cr of 0.9). Liver Function Tests:  Recent Labs Lab 12/23/15 2100  AST 33  ALT 25  ALKPHOS 115  BILITOT 0.4  PROT 8.1  ALBUMIN 3.5   No results for input(s): LIPASE, AMYLASE in the last 168 hours. No results for input(s): AMMONIA in the last 168 hours. Coagulation Profile: No results for input(s): INR, PROTIME in the last 168 hours. Cardiac Enzymes: No results for input(s): CKTOTAL, CKMB, CKMBINDEX, TROPONINI in the last 168 hours. BNP (last 3 results) No results for input(s): PROBNP in the last 8760 hours. HbA1C: No results for input(s): HGBA1C in the  last 72 hours. CBG: No results for input(s): GLUCAP in the last 168 hours. Lipid Profile: No results for input(s): CHOL, HDL, LDLCALC, TRIG, CHOLHDL, LDLDIRECT in the last 72 hours. Thyroid Function Tests: No results for input(s): TSH, T4TOTAL, FREET4, T3FREE, THYROIDAB in the last 72 hours. Anemia Panel: No results for input(s): VITAMINB12, FOLATE, FERRITIN, TIBC, IRON, RETICCTPCT in the last 72 hours. Urine analysis:    Component Value Date/Time   COLORURINE YELLOW 08/07/2015 1000   APPEARANCEUR CLEAR 08/07/2015 1000   LABSPEC 1.020 08/07/2015 1000   PHURINE 5.5 08/07/2015 1000   GLUCOSEU 500* 08/07/2015 1000   HGBUR MODERATE* 08/07/2015 1000   BILIRUBINUR NEGATIVE 08/07/2015 1000   KETONESUR TRACE* 08/07/2015 1000   PROTEINUR NEGATIVE 08/07/2015 1000   UROBILINOGEN 0.2 05/15/2015 0928   NITRITE NEGATIVE 08/07/2015 1000   LEUKOCYTESUR NEGATIVE 08/07/2015 1000   Sepsis Labs: '@LABRCNTIP'$ (procalcitonin:4,lacticidven:4) )No results found for this or any previous visit (from the past 240 hour(s)).   Radiological Exams on Admission: Ct Head Wo Contrast  12/24/2015  CLINICAL DATA:  History of stage IV metastatic cancer. Right chest pain and blood in stools. Headache. EXAM: CT HEAD WITHOUT CONTRAST TECHNIQUE: Contiguous axial images were obtained from the base of  the skull through the vertex without intravenous contrast. COMPARISON:  MRI brain 08/10/2015.  CT head 02/26/2015. FINDINGS: Ventricles and sulci appear symmetrical. No ventricular dilatation. No mass effect or midline shift. No abnormal extra-axial fluid collections. Gray-white matter junctions are distinct. Basal cisterns are not effaced. No evidence of acute intracranial hemorrhage. No depressed skull fractures. Visualized paranasal sinuses and mastoid air cells are not opacified. If there is suspicion for intracranial metastasis, contrast-enhanced MRI would be more sensitive for detection of small metastatic lesions. IMPRESSION: No acute intracranial abnormalities. No significant mass effect or focal lesion identified. Electronically Signed   By: Lucienne Capers M.D.   On: 12/24/2015 00:01   Ct Chest W Contrast  12/24/2015  CLINICAL DATA:  Stage IV colon cancer metastatic to lungs and liver. Right rib cage pain. Blood in stools. EXAM: CT CHEST, ABDOMEN, AND PELVIS WITH CONTRAST TECHNIQUE: Multidetector CT imaging of the chest, abdomen and pelvis was performed following the standard protocol during bolus administration of intravenous contrast. CONTRAST:  13m ISOVUE-300 IOPAMIDOL (ISOVUE-300) INJECTION 61% COMPARISON:  09/03/2015 FINDINGS: CT CHEST FINDINGS Mediastinum/Lymph Nodes: There is right pretracheal lymphadenopathy measuring 1.5 cm diameter, increasing since prior study. Subcarinal lymphadenopathy measures 1.4 cm, increasing since prior study. Normal heart size. Normal caliber thoracic aorta. Esophagus is decompressed. Lungs/Pleura: Multiple pulmonary metastases diffusely throughout both lungs. Largest lesion is in the right lung base posteriorly and measures 5.9 by 4.9 cm, increasing since previous study. No pleural effusions. No pneumothorax. Musculoskeletal: No chest wall mass or suspicious bone lesions identified. CT ABDOMEN PELVIS FINDINGS Hepatobiliary: Multiple low-attenuation metastases  throughout the liver. The lesion previously measured in the dome of the liver today measures 4.8 x 3.6 cm, representing significant increase in size. Previously measured lesion in the inferior right lobe of the liver today measures 3.8 by 2.7 cm, representing significant increase. Previous lesion at the junction of segments 4 and 8 today measures 3.3 x 3.4 cm, representing significant increase in size. The gallbladder is either contracted or surgically absent. No bile duct dilatation. Pancreas: No mass, inflammatory changes, or other significant abnormality. Spleen: Within normal limits in size and appearance. Adrenals/Urinary Tract: No masses identified. No evidence of hydronephrosis. Stomach/Bowel: No evidence of obstruction, inflammatory process, or abnormal fluid collections.There is a circumferential mass in  the mid sigmoid colon, more prominent than on prior study. There is regional lymphadenopathy adjacent to the sigmoid colon measuring up to 2.1 cm in diameter, increasing since previous study. Vascular/Lymphatic: No pathologically enlarged lymph nodes. No evidence of abdominal aortic aneurysm. Reproductive: Multiple uterine masses are likely to represent fibroids. No abnormal adnexal masses. Other: None. Musculoskeletal:  No suspicious bone lesions identified. IMPRESSION: Chest: Significant progression of multiple bilateral pulmonary metastases. Mild progression of mediastinal lymphadenopathy. Abdomen:  Significant progression of multiple hepatic metastases. Pelvis: Mass in the sigmoid colon, increasing in prominence since previous study. Increasing regional lymphadenopathy around the sigmoid colon. Electronically Signed   By: Lucienne Capers M.D.   On: 12/24/2015 00:12   Ct Abdomen Pelvis W Contrast  12/24/2015  CLINICAL DATA:  Stage IV colon cancer metastatic to lungs and liver. Right rib cage pain. Blood in stools. EXAM: CT CHEST, ABDOMEN, AND PELVIS WITH CONTRAST TECHNIQUE: Multidetector CT imaging  of the chest, abdomen and pelvis was performed following the standard protocol during bolus administration of intravenous contrast. CONTRAST:  11m ISOVUE-300 IOPAMIDOL (ISOVUE-300) INJECTION 61% COMPARISON:  09/03/2015 FINDINGS: CT CHEST FINDINGS Mediastinum/Lymph Nodes: There is right pretracheal lymphadenopathy measuring 1.5 cm diameter, increasing since prior study. Subcarinal lymphadenopathy measures 1.4 cm, increasing since prior study. Normal heart size. Normal caliber thoracic aorta. Esophagus is decompressed. Lungs/Pleura: Multiple pulmonary metastases diffusely throughout both lungs. Largest lesion is in the right lung base posteriorly and measures 5.9 by 4.9 cm, increasing since previous study. No pleural effusions. No pneumothorax. Musculoskeletal: No chest wall mass or suspicious bone lesions identified. CT ABDOMEN PELVIS FINDINGS Hepatobiliary: Multiple low-attenuation metastases throughout the liver. The lesion previously measured in the dome of the liver today measures 4.8 x 3.6 cm, representing significant increase in size. Previously measured lesion in the inferior right lobe of the liver today measures 3.8 by 2.7 cm, representing significant increase. Previous lesion at the junction of segments 4 and 8 today measures 3.3 x 3.4 cm, representing significant increase in size. The gallbladder is either contracted or surgically absent. No bile duct dilatation. Pancreas: No mass, inflammatory changes, or other significant abnormality. Spleen: Within normal limits in size and appearance. Adrenals/Urinary Tract: No masses identified. No evidence of hydronephrosis. Stomach/Bowel: No evidence of obstruction, inflammatory process, or abnormal fluid collections.There is a circumferential mass in the mid sigmoid colon, more prominent than on prior study. There is regional lymphadenopathy adjacent to the sigmoid colon measuring up to 2.1 cm in diameter, increasing since previous study. Vascular/Lymphatic: No  pathologically enlarged lymph nodes. No evidence of abdominal aortic aneurysm. Reproductive: Multiple uterine masses are likely to represent fibroids. No abnormal adnexal masses. Other: None. Musculoskeletal:  No suspicious bone lesions identified. IMPRESSION: Chest: Significant progression of multiple bilateral pulmonary metastases. Mild progression of mediastinal lymphadenopathy. Abdomen:  Significant progression of multiple hepatic metastases. Pelvis: Mass in the sigmoid colon, increasing in prominence since previous study. Increasing regional lymphadenopathy around the sigmoid colon. Electronically Signed   By: WLucienne CapersM.D.   On: 12/24/2015 00:12     Assessment/Plan Principal Problem:   Rectal bleeding Active Problems:   Hypertension   Diabetes mellitus type 2 in obese ([facility]   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 ([place]    1. Rectal bleeding with known history of metastatic colon cancer with CT showing progression of metastasis and mass in the sigmoid colon with increased prominence - rectal bleeding could be from diverticulosis or patient's known cancer with CT showing pelvic mass. At this time we will discontinue  Xarelto which patient is agreeable to at this time. Type and screen and may consider FFP transfusion if there is no bleeding or if hemoglobin drops less than 7 then [facility] with PRBC transfusion. Closely follow CBC. I have requested GI and surgery consult. Patient will be kept nothing by mouth for now. Will keep patient on Pepcid since patient also uses Excedrin. 2. Diabetes mellitus type 2 - patient will be placed on sliding scale coverage. 3. Hypertension - since patient is nothing by mouth patient is on when necessary IV hydralazine.   DVT prophylaxis: SCDs. Code Status: Full code.  Family Communication: No family at the bedside.  Disposition Plan: Home.  Consults called: GI and surgery consult requested.  Admission status: Inpatient. Step down. Likely stay 2-3 days.     Rise Patience MD [facility] Pager [phone].  If 7PM-7AM, please contact night-coverage [email or web address] Password [facility]  12/24/2015, 2:31 AM

## 2015-12-24 NOTE — Consult Note (Signed)
[facility] Consultation Oncology  Name: Erica Barker      MRN: [id number]    Location: IC02/IC02-01  Date: 12/24/2015 Time:6:14 PM   REFERRING PHYSICIAN:  Orvan Falconer, MD ([facility])  REASON FOR CONSULT:  "Progression of known cancer"   DIAGNOSIS:  Stage IV CRC, KRAS mutated and MSI-stable, on salvage therapy with Stivarga   HISTORY OF PRESENT ILLNESS:   Erica Barker is a wonderful 55 year old black American who is well known to the [facility] where she has been undergoing treatment for Stage IV CRC.    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 [facility])   02/20/2014 Imaging CT of abdomen and pelvis on 02/20/2014 at [facility] showing 3.6 cm apple core lesion in the proximal sigmoid colon 11 mm hypoenhancing lesion in the medial left hepatic dome, 4.1 cm right lower lobe mass and adjacent right lower lobe nodule   02/23/2014 Pathology Results KRAS MUTATED   04/17/2014 Imaging Right upper extremity DVT 04/17/2014 paired brachial veins, axillary vein, and peripheral aspect of the right subclavian vein   07/06/2014 Imaging CT chest 07/06/2014 with bilateral pulmonary nodules and masses, RUL, lateral RUL, posterior LUL, lobulated nodule in the subpleural RLL   08/18/2014 Tumor Marker CEA 93.7 ng/ml   10/09/2014 Initial Diagnosis 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   10/11/2014 - 01/29/2015 Chemotherapy FOLFOX + Avastin beginning at [facility]   01/29/2015 Imaging Port study- Unremarkable Port-A-Cath injection.   02/19/2015 Imaging Progressive pulmonary metastatic disease as detailed above. Stable mediastinal lymphadenopathy. No abdominal/pelvic metastatic disease is identified   04/24/2015 - 08/07/2015 Chemotherapy XELIRI started on 04/24/2015. Patient did not want to wear 5-FU pump.  On 2000 mg BID 14 days on and 7 days off.   04/28/2015 Adverse Reaction Mild stomatitis.  Xeloda held.  Treated with Magic Mouthwash.  Resolved by 05/02/2015 at office visit.   05/15/2015 Treatment Plan  Change Will restart Xeloda at 1500 mg BID 14 days on and 7 days off (11/1)   07/17/2015 Adverse Reaction Mouth sores   07/17/2015 Treatment Plan Change Change in Xeloda frequency: 1500 mg BID 7 days on and 7 days off.   07/24/2015 Adverse Reaction Increased nausea/vomiting   08/07/2015 Treatment Plan Change Irinotecan dose reduced by 10%   08/10/2015 Imaging MRI brain- Scattered punctate subcortical T2 hyperintensities bilaterally are slightly greater than expected for age. The finding is nonspecific but can be seen in the setting of chronic microvascular ischemia, a demyelinating process such as multiple...   09/03/2015 Progression CT CAP- demonstrates new hepatic lesions   09/03/2015 Imaging CT CAP- Multiple new hepatic metastasis in the R hepatic lobe corresponds to subtle hypodensities identified on comparison exam.  Stable metastatic bilateral pulmonary nodules and masses. Small mesenteric lymph nodes along the sigmoid colon are unchanged   09/24/2015 -  Chemotherapy Stivarga 160 mg daily 21/28 days    She is admitted with GI bleeding and right thorax pain.  Her work-up has consisted of imaging, including CT CAP and head that demonstrates progression of disease. I personally reviewed and went over radiographic studies with the patient.  The results are noted within this dictation.    I personally reviewed and went over laboratory results with the patient.  The results are noted within this dictation.   PAST MEDICAL HISTORY:   Past Medical History  Diagnosis Date  . Hypertension   . Depression   . Pulmonary nodules 01/30/14    CTA CHEST  . Lymphadenopathy, mediastinal 01/2014    [place]  . Morbid obesity ([place])   . Diabetes mellitus, type II ([place])   . Headache(784.0)   . Medically noncompliant 09/06/2015  . Cancer ([place])     stage 4 colon with mets to liver and lung    ALLERGIES: Allergies  Allergen Reactions  . Asa [Aspirin] Hives      MEDICATIONS: I have reviewed  the patient's current medications.     PAST SURGICAL HISTORY Past Surgical History  Procedure Laterality Date  . Cholecystectomy    . Video bronchoscopy with endobronchial ultrasound N/A 02/06/2014    Procedure: VIDEO BRONCHOSCOPY WITH ENDOBRONCHIAL ULTRASOUND,bronchial biopsies , node sampling;  Surgeon: Melrose Nakayama, MD;  Location: [facility] OR;  Service: Thoracic;  Laterality: N/A;  . Tubal ligation      FAMILY HISTORY: Family History  Problem Relation Age of Onset  . Cancer Mother     UTERINE  . Cancer Father     LEUKEMIA  . Crohn's disease Son   . Colon cancer Neg Hx     SOCIAL HISTORY:  reports that she quit smoking about 26 years ago. Her smoking use included Cigarettes. She has a 3.5 pack-year smoking history. She has never used smokeless tobacco. She reports that she drinks alcohol. She reports that she does not use illicit drugs.  PERFORMANCE STATUS: The patient's performance status is 0 - Asymptomatic  PHYSICAL EXAM: Most Recent Vital Signs: Blood pressure 144/95, pulse 87, temperature 98.5 F (36.9 C), temperature source Oral, resp. rate 25, height '5\' 4"'$  (1.626 m), weight 245 lb 2.4 oz (111.2 kg), last menstrual period 10/12/2013, SpO2 100 %. General appearance: alert, cooperative, appears stated age, no distress and daughter, [name], at the bedside Head: Normocephalic, without obvious abnormality, atraumatic Eyes: negative findings: lids and lashes normal, conjunctivae and sclerae normal and corneas clear Throat: lips, mucosa, and tongue normal; teeth and gums normal Lungs: clear to auscultation bilaterally Heart: regular rate and rhythm Abdomen: soft, non-tender; bowel sounds normal; no masses,  no organomegaly Skin: Skin color, texture, turgor normal. No rashes or lesions Neurologic: Grossly normal  LABORATORY DATA:  Results for orders placed or performed during the hospital encounter of 12/23/15 (from the past 48 hour(s))  CBC with Differential/Platelet      Status: Abnormal   Collection Time: 12/23/15  9:00 PM  Result Value Ref Range   WBC 16.5 (H) 4.0 - 10.5 K/uL   RBC 4.55 3.87 - 5.11 MIL/uL   Hemoglobin 12.4 12.0 - 15.0 g/dL   HCT 38.9 36.0 - 46.0 %   MCV 85.5 78.0 - 100.0 fL   MCH 27.3 26.0 - 34.0 pg   MCHC 31.9 30.0 - 36.0 g/dL   RDW 15.2 11.5 - 15.5 %   Platelets 304 150 - 400 K/uL   Neutrophils Relative % 63 %   Neutro Abs 10.3 (H) 1.7 - 7.7 K/uL   Lymphocytes Relative 28 %   Lymphs Abs 4.6 (H) 0.7 - 4.0 K/uL   Monocytes Relative 7 %   Monocytes Absolute 1.2 (H) 0.1 - 1.0 K/uL   Eosinophils Relative 2 %   Eosinophils Absolute 0.4 0.0 - 0.7 K/uL   Basophils Relative 0 %   Basophils Absolute 0.1 0.0 - 0.1 K/uL  Comprehensive metabolic panel     Status: Abnormal   Collection Time: 12/23/15  9:00 PM  Result Value Ref Range   Sodium 136 135 - 145 mmol/L   Potassium 3.6 3.5 - 5.1 mmol/L   Chloride 102 101 - 111  mmol/L   CO2 25 22 - 32 mmol/L   Glucose, Bld 169 (H) 65 - 99 mg/dL   BUN 10 6 - 20 mg/dL   Creatinine, Ser 0.85 0.44 - 1.00 mg/dL   Calcium 9.0 8.9 - 10.3 mg/dL   Total Protein 8.1 6.5 - 8.1 g/dL   Albumin 3.5 3.5 - 5.0 g/dL   AST 33 15 - 41 U/L   ALT 25 14 - 54 U/L   Alkaline Phosphatase 115 38 - 126 U/L   Total Bilirubin 0.4 0.3 - 1.2 mg/dL   GFR calc non Af Amer >60 >60 mL/min   GFR calc Af Amer >60 >60 mL/min    Comment: (NOTE) The eGFR has been calculated using the CKD EPI equation. This calculation has not been validated in all clinical situations. eGFR's persistently <60 mL/min signify possible Chronic Kidney Disease.    Anion gap 9 5 - 15  I-stat chem 8, ed     Status: Abnormal   Collection Time: 12/23/15  9:20 PM  Result Value Ref Range   Sodium 139 135 - 145 mmol/L   Potassium 3.5 3.5 - 5.1 mmol/L   Chloride 101 101 - 111 mmol/L   BUN 9 6 - 20 mg/dL   Creatinine, Ser 0.90 0.44 - 1.00 mg/dL   Glucose, Bld 174 (H) 65 - 99 mg/dL   Calcium, Ion 1.14 1.12 - 1.23 mmol/L   TCO2 26 0 - 100 mmol/L    Hemoglobin 13.9 12.0 - 15.0 g/dL   HCT 41.0 36.0 - 46.0 %  MRSA PCR Screening     Status: Abnormal   Collection Time: 12/24/15  3:28 AM  Result Value Ref Range   MRSA by PCR INVALID RESULTS, SPECIMEN SENT FOR CULTURE (A) NEGATIVE    Comment: HEITGER J. AT [id number] ON [id number] BY THOMPSON S.        The GeneXpert MRSA Assay (FDA approved for NASAL specimens only), is one component of a comprehensive MRSA colonization surveillance program. It is not intended to diagnose MRSA infection nor to guide or monitor treatment for MRSA infections.   Glucose, capillary     Status: Abnormal   Collection Time: 12/24/15  3:43 AM  Result Value Ref Range   Glucose-Capillary 217 (H) 65 - 99 mg/dL   Comment 1 Notify RN   APTT     Status: None   Collection Time: 12/24/15  4:05 AM  Result Value Ref Range   aPTT 32 24 - 37 seconds  Protime-INR     Status: Abnormal   Collection Time: 12/24/15  4:05 AM  Result Value Ref Range   Prothrombin Time 19.0 (H) 11.6 - 15.2 seconds   INR 1.59 (H) 0.00 - 1.49  Type and screen     Status: None   Collection Time: 12/24/15  4:05 AM  Result Value Ref Range   ABO/RH(D) O POS    Antibody Screen NEG    Sample Expiration 12/27/2015   Comprehensive metabolic panel     Status: Abnormal   Collection Time: 12/24/15  4:05 AM  Result Value Ref Range   Sodium 134 (L) 135 - 145 mmol/L   Potassium 3.5 3.5 - 5.1 mmol/L   Chloride 102 101 - 111 mmol/L   CO2 24 22 - 32 mmol/L   Glucose, Bld 216 (H) 65 - 99 mg/dL   BUN 9 6 - 20 mg/dL   Creatinine, Ser 0.64 0.44 - 1.00 mg/dL   Calcium 8.6 (L) 8.9 - 10.3 mg/dL  Total Protein 7.6 6.5 - 8.1 g/dL   Albumin 3.3 (L) 3.5 - 5.0 g/dL   AST 24 15 - 41 U/L   ALT 22 14 - 54 U/L   Alkaline Phosphatase 108 38 - 126 U/L   Total Bilirubin 0.5 0.3 - 1.2 mg/dL   GFR calc non Af Amer >60 >60 mL/min   GFR calc Af Amer >60 >60 mL/min    Comment: (NOTE) The eGFR has been calculated using the CKD EPI equation. This calculation has not been  validated in all clinical situations. eGFR's persistently <60 mL/min signify possible Chronic Kidney Disease.    Anion gap 8 5 - 15  CBC     Status: Abnormal   Collection Time: 12/24/15  4:05 AM  Result Value Ref Range   WBC 15.0 (H) 4.0 - 10.5 K/uL   RBC 4.32 3.87 - 5.11 MIL/uL   Hemoglobin 11.6 (L) 12.0 - 15.0 g/dL   HCT 37.2 36.0 - 46.0 %   MCV 86.1 78.0 - 100.0 fL   MCH 26.9 26.0 - 34.0 pg   MCHC 31.2 30.0 - 36.0 g/dL   RDW 15.0 11.5 - 15.5 %   Platelets 273 150 - 400 K/uL  Glucose, capillary     Status: Abnormal   Collection Time: 12/24/15  7:17 AM  Result Value Ref Range   Glucose-Capillary 255 (H) 65 - 99 mg/dL  Glucose, capillary     Status: Abnormal   Collection Time: 12/24/15 11:13 AM  Result Value Ref Range   Glucose-Capillary 162 (H) 65 - 99 mg/dL   Comment 1 Notify RN    Comment 2 Document in Chart   Glucose, capillary     Status: Abnormal   Collection Time: 12/24/15  4:25 PM  Result Value Ref Range   Glucose-Capillary 147 (H) 65 - 99 mg/dL      RADIOGRAPHY: Ct Head Wo Contrast  12/24/2015  CLINICAL DATA:  History of stage IV metastatic cancer. Right chest pain and blood in stools. Headache. EXAM: CT HEAD WITHOUT CONTRAST TECHNIQUE: Contiguous axial images were obtained from the base of the skull through the vertex without intravenous contrast. COMPARISON:  MRI brain 08/10/2015.  CT head 02/26/2015. FINDINGS: Ventricles and sulci appear symmetrical. No ventricular dilatation. No mass effect or midline shift. No abnormal extra-axial fluid collections. Gray-white matter junctions are distinct. Basal cisterns are not effaced. No evidence of acute intracranial hemorrhage. No depressed skull fractures. Visualized paranasal sinuses and mastoid air cells are not opacified. If there is suspicion for intracranial metastasis, contrast-enhanced MRI would be more sensitive for detection of small metastatic lesions. IMPRESSION: No acute intracranial abnormalities. No significant  mass effect or focal lesion identified. Electronically Signed   By: Lucienne Capers M.D.   On: 12/24/2015 00:01   Ct Chest W Contrast  12/24/2015  CLINICAL DATA:  Stage IV colon cancer metastatic to lungs and liver. Right rib cage pain. Blood in stools. EXAM: CT CHEST, ABDOMEN, AND PELVIS WITH CONTRAST TECHNIQUE: Multidetector CT imaging of the chest, abdomen and pelvis was performed following the standard protocol during bolus administration of intravenous contrast. CONTRAST:  164m ISOVUE-300 IOPAMIDOL (ISOVUE-300) INJECTION 61% COMPARISON:  09/03/2015 FINDINGS: CT CHEST FINDINGS Mediastinum/Lymph Nodes: There is right pretracheal lymphadenopathy measuring 1.5 cm diameter, increasing since prior study. Subcarinal lymphadenopathy measures 1.4 cm, increasing since prior study. Normal heart size. Normal caliber thoracic aorta. Esophagus is decompressed. Lungs/Pleura: Multiple pulmonary metastases diffusely throughout both lungs. Largest lesion is in the right lung base posteriorly and measures  5.9 by 4.9 cm, increasing since previous study. No pleural effusions. No pneumothorax. Musculoskeletal: No chest wall mass or suspicious bone lesions identified. CT ABDOMEN PELVIS FINDINGS Hepatobiliary: Multiple low-attenuation metastases throughout the liver. The lesion previously measured in the dome of the liver today measures 4.8 x 3.6 cm, representing significant increase in size. Previously measured lesion in the inferior right lobe of the liver today measures 3.8 by 2.7 cm, representing significant increase. Previous lesion at the junction of segments 4 and 8 today measures 3.3 x 3.4 cm, representing significant increase in size. The gallbladder is either contracted or surgically absent. No bile duct dilatation. Pancreas: No mass, inflammatory changes, or other significant abnormality. Spleen: Within normal limits in size and appearance. Adrenals/Urinary Tract: No masses identified. No evidence of hydronephrosis.  Stomach/Bowel: No evidence of obstruction, inflammatory process, or abnormal fluid collections.There is a circumferential mass in the mid sigmoid colon, more prominent than on prior study. There is regional lymphadenopathy adjacent to the sigmoid colon measuring up to 2.1 cm in diameter, increasing since previous study. Vascular/Lymphatic: No pathologically enlarged lymph nodes. No evidence of abdominal aortic aneurysm. Reproductive: Multiple uterine masses are likely to represent fibroids. No abnormal adnexal masses. Other: None. Musculoskeletal:  No suspicious bone lesions identified. IMPRESSION: Chest: Significant progression of multiple bilateral pulmonary metastases. Mild progression of mediastinal lymphadenopathy. Abdomen:  Significant progression of multiple hepatic metastases. Pelvis: Mass in the sigmoid colon, increasing in prominence since previous study. Increasing regional lymphadenopathy around the sigmoid colon. Electronically Signed   By: Lucienne Capers M.D.   On: 12/24/2015 00:12   Ct Abdomen Pelvis W Contrast  12/24/2015  CLINICAL DATA:  Stage IV colon cancer metastatic to lungs and liver. Right rib cage pain. Blood in stools. EXAM: CT CHEST, ABDOMEN, AND PELVIS WITH CONTRAST TECHNIQUE: Multidetector CT imaging of the chest, abdomen and pelvis was performed following the standard protocol during bolus administration of intravenous contrast. CONTRAST:  126m ISOVUE-300 IOPAMIDOL (ISOVUE-300) INJECTION 61% COMPARISON:  09/03/2015 FINDINGS: CT CHEST FINDINGS Mediastinum/Lymph Nodes: There is right pretracheal lymphadenopathy measuring 1.5 cm diameter, increasing since prior study. Subcarinal lymphadenopathy measures 1.4 cm, increasing since prior study. Normal heart size. Normal caliber thoracic aorta. Esophagus is decompressed. Lungs/Pleura: Multiple pulmonary metastases diffusely throughout both lungs. Largest lesion is in the right lung base posteriorly and measures 5.9 by 4.9 cm, increasing  since previous study. No pleural effusions. No pneumothorax. Musculoskeletal: No chest wall mass or suspicious bone lesions identified. CT ABDOMEN PELVIS FINDINGS Hepatobiliary: Multiple low-attenuation metastases throughout the liver. The lesion previously measured in the dome of the liver today measures 4.8 x 3.6 cm, representing significant increase in size. Previously measured lesion in the inferior right lobe of the liver today measures 3.8 by 2.7 cm, representing significant increase. Previous lesion at the junction of segments 4 and 8 today measures 3.3 x 3.4 cm, representing significant increase in size. The gallbladder is either contracted or surgically absent. No bile duct dilatation. Pancreas: No mass, inflammatory changes, or other significant abnormality. Spleen: Within normal limits in size and appearance. Adrenals/Urinary Tract: No masses identified. No evidence of hydronephrosis. Stomach/Bowel: No evidence of obstruction, inflammatory process, or abnormal fluid collections.There is a circumferential mass in the mid sigmoid colon, more prominent than on prior study. There is regional lymphadenopathy adjacent to the sigmoid colon measuring up to 2.1 cm in diameter, increasing since previous study. Vascular/Lymphatic: No pathologically enlarged lymph nodes. No evidence of abdominal aortic aneurysm. Reproductive: Multiple uterine masses are likely to represent fibroids. No  abnormal adnexal masses. Other: None. Musculoskeletal:  No suspicious bone lesions identified. IMPRESSION: Chest: Significant progression of multiple bilateral pulmonary metastases. Mild progression of mediastinal lymphadenopathy. Abdomen:  Significant progression of multiple hepatic metastases. Pelvis: Mass in the sigmoid colon, increasing in prominence since previous study. Increasing regional lymphadenopathy around the sigmoid colon. Electronically Signed   By: Lucienne Capers M.D.   On: 12/24/2015 00:12       PATHOLOGY:  N/A    ASSESSMENT/PLAN:   Very nice 55 year old black American woman who is well-known to the [facility] where she is currently on salvage Stivarga beginning on 09/24/2015 for Stage IV CRC, KRAS mutated and MSI-stable.  Imaging today demonstrates progression of disease.  We discussed options moving forward.  Unfortunately, treatment options are limited.  We discussed changing treatment from [place] to [facility], again in the salvage setting.  Additionally, she is agreeable to a referral to [facility] for consideration of clinical trial for MSI-stable CRC. We discussed the role of clinical trial and why it is a good idea in her situation.  Lonsurf is dosed based upon the trifluridine component at 35 mg/m2 PO BID days 1-5 and 8-12 every 28 days.   She presented with rectal bleeding and her Xarelto anticoagulation has been discontinued.  She was on anticoagulation right upper extremity DVT in 2015.  This has since been discontinued which we agree with.  She has been evaluated by GI and Gen Surg and we appreciate their input regarding this lovely patient.  She does have an appointment on 12/27/2015 with Dr. Whitney Muse at that [facility] [place].  We will see her then to arrange for chemotherapy teaching.  In the interim, I will prescribe Lonsurf and ascertain insurance approval in addition to a referral to [facility] for consideration of clinical trial.    Oncology will sign off at this time.  Please call if needed.   All questions were answered. The patient knows to call the clinic with any problems, questions or concerns. We can certainly see the patient much sooner if necessary.  Patient discussed with Dr. Whitney Muse and together we ascertained an up-to-date interval history, and examined the patient.  Dr. Whitney Muse developed the patient's assessment and plan.  This was a shared visit-consultation.  Her attestation will follow below.  KEFALAS,THOMAS  12/24/2015 6:28 PM    Patient seen and  as is stated above. Unfortunately no great treatment options moving forward. Will write for Lonsurf and refer to [facility] for consideration of clinical trial. Her PS remains excellent. Will work on referral and should have appointment in place for her by Friday's appointment with me. I am hoping she can be seen at [facility] next week. Plan discussed with patient and she is in agreement. Continue to hold XARELTO. Donald Pore MD

## 2015-12-24 NOTE — Progress Notes (Signed)
Inpatient Diabetes Program Recommendations  AACE/ADA: New Consensus Statement on Inpatient Glycemic Control (2015)  Target Ranges:  Prepandial:   less than 140 mg/dL      Peak postprandial:   less than 180 mg/dL (1-2 hours)      Critically ill patients:  140 - 180 mg/dL   Results for Erica Barker, Erica Barker (MRN [id number]) as of 12/24/2015 07:50  Ref. Range 12/24/2015 03:43 12/24/2015 07:17  Glucose-Capillary Latest Ref Range: 65-99 mg/dL 217 (H) 255 (H)   Review of Glycemic Control  Diabetes history: DM2 Outpatient Diabetes medications: Metformin 500 mg QAM Current orders for Inpatient glycemic control: Novolog 0-9 units Q4H  Inpatient Diabetes Program Recommendations: Correction (SSI): Please consider increasing Novolog correction to moderate scale Q4H.  Thanks, Barnie Alderman, RN, MSN, CDE Diabetes Coordinator Inpatient Diabetes Program [phone] (Team Pager from [place] to [place]) [phone] ([facility] office) [phone] [facility] office) [phone] [facility] office)

## 2015-12-24 NOTE — Progress Notes (Signed)
[facility] PROGRESS NOTE  Erica Barker UXL:[id number] DOB: [birth year]    PCP:   No PCP Per Patient   HPI: Erica Barker is an 55 y.o. 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  Her Xarelto was stopped, and GI and surgery were consulted.  She has no complaints except for her HA.      Rewiew of Systems:  Constitutional: Negative for malaise, fever and chills. No significant weight loss or weight gain Eyes: Negative for eye pain, redness and discharge, diplopia, visual changes, or flashes of light. ENMT: Negative for ear pain, hoarseness, nasal congestion, sinus pressure and sore throat. No headaches; tinnitus, drooling, or problem swallowing. Cardiovascular: Negative for chest pain, palpitations, diaphoresis, dyspnea and peripheral edema. ; No orthopnea, PND Respiratory: Negative for cough, hemoptysis, wheezing and stridor. No pleuritic chestpain. Gastrointestinal: Negative for nausea, vomiting, diarrhea, constipation, abdominal pain, melena, hematemesis, jaundice and rectal bleeding.    Genitourinary: Negative for frequency, dysuria, incontinence,flank pain and hematuria; Musculoskeletal: Negative for back pain and neck pain. Negative for swelling and trauma.;  Skin: . Negative for pruritus, rash, abrasions, bruising and skin lesion.; ulcerations Neuro: Negative for headache, lightheadedness and neck stiffness. Negative for weakness, altered level of consciousness , altered mental status, extremity weakness, burning feet, involuntary movement, seizure and syncope.  Psych: negative for anxiety, depression, insomnia, tearfulness, panic attacks, hallucinations, paranoia, suicidal or homicidal ideation    Past Medical History  Diagnosis Date  . Hypertension   . Depression   . Pulmonary  nodules 01/30/14    CTA CHEST  . Lymphadenopathy, mediastinal 01/2014    [place]  . Morbid obesity ([place])   . Diabetes mellitus, type II ([place])   . Headache(784.0)   . Medically noncompliant 09/06/2015  . Cancer ([place])     stage 4 colon with mets to liver and lung    Past Surgical History  Procedure Laterality Date  . Cholecystectomy    . Video bronchoscopy with endobronchial ultrasound N/A 02/06/2014    Procedure: VIDEO BRONCHOSCOPY WITH ENDOBRONCHIAL ULTRASOUND,bronchial biopsies , node sampling;  Surgeon: Melrose Nakayama, MD;  Location: [place];  Service: Thoracic;  Laterality: N/A;  . Tubal ligation      Medications:  HOME MEDS: Prior to Admission medications   Medication Sig Start Date End Date Taking? Authorizing Provider  aspirin-acetaminophen-caffeine (EXCEDRIN MIGRAINE) [phone] MG per tablet Take 2 tablets by mouth every 6 (six) hours as needed for headache.   Yes Historical Provider, MD  baclofen (LIORESAL) 10 MG tablet Take 10 mg by mouth 2 (two) times daily as needed for muscle spasms.   Yes Historical Provider, MD  dexlansoprazole (DEXILANT) 60 MG capsule Take 1 capsule (60 mg total) by mouth daily. 10/01/15  Yes Manon Hilding Kefalas, PA-C  gabapentin (NEURONTIN) 300 MG capsule Take 300 mg by mouth at bedtime.   Yes Historical Provider, MD  lisinopril (PRINIVIL,ZESTRIL) 10 MG tablet Take 1 tablet (10 mg total) by mouth daily. 10/20/14  Yes Patrici Ranks, MD  metFORMIN (GLUCOPHAGE) 500 MG tablet Take 1 tablet (500 mg total) by mouth daily with breakfast. 07/17/15  Yes Patrici Ranks, MD  Multiple Vitamin (MULTIVITAMIN) capsule Take 1 capsule by mouth daily.   Yes Historical Provider, MD  oxyCODONE-acetaminophen (PERCOCET/ROXICET) 5-325 MG tablet Take 1  tablet by mouth every 4 (four) hours as needed for severe pain. 11/09/15  Yes Patrici Ranks, MD  potassium chloride SA (K-DUR,KLOR-CON) 20 MEQ tablet Take 2 tablets (40 mEq total) by mouth daily.  11/23/15  Yes Baird Cancer, PA-C  regorafenib (STIVARGA) 40 MG tablet Take 4 tablets (160 mg total) by mouth daily with breakfast. Take with low fat meal. Caution: Chemotherapy. 11/27/15  Yes Baird Cancer, PA-C  rivaroxaban (XARELTO) 20 MG TABS tablet Take 1 tablet (20 mg total) by mouth daily with supper. 07/17/15  Yes Patrici Ranks, MD  sucralfate (CARAFATE) 1 GM/10ML suspension Take 10 mLs (1 g total) by mouth 4 (four) times daily -  with meals and at bedtime. 11/09/15  Yes Patrici Ranks, MD     Allergies:  Allergies  Allergen Reactions  . Asa [Aspirin] Hives    Social History:   reports that she quit smoking about 26 years ago. Her smoking use included Cigarettes. She has a 3.5 pack-year smoking history. She has never used smokeless tobacco. She reports that she drinks alcohol. She reports that she does not use illicit drugs.  Family History: Family History  Problem Relation Age of Onset  . Cancer Mother     UTERINE  . Cancer Father     LEUKEMIA  . Crohn's disease Son      Physical Exam: Filed Vitals:   12/24/15 0337 12/24/15 0400 12/24/15 0700 12/24/15 0755  BP: 139/100 144/96 136/93   Pulse:  88 85   Temp: 97.9 F (36.6 C)   99 F (37.2 C)  TempSrc: Oral   Oral  Resp: '20 22 24   '$ Height: '5\' 4"'$  (1.626 m)     Weight: 111.2 kg (245 lb 2.4 oz)     SpO2:  99% 98%    Blood pressure 136/93, pulse 85, temperature 99 F (37.2 C), temperature source Oral, resp. rate 24, height '5\' 4"'$  (1.626 m), weight 111.2 kg (245 lb 2.4 oz), last menstrual period 10/12/2013, SpO2 98 %.  GEN:  Pleasant  patient lying in the stretcher in no acute distress; cooperative with exam. PSYCH:  alert and oriented x4; does not appear anxious or depressed; affect is appropriate. HEENT: Mucous membranes pink and anicteric; PERRLA; EOM intact; no cervical lymphadenopathy nor thyromegaly or carotid bruit; no JVD; There were no stridor. Neck is very supple. Breasts:: Not examined CHEST WALL: No  tenderness CHEST: Normal respiration, clear to auscultation bilaterally.  HEART: Regular rate and rhythm.  There are no murmur, rub, or gallops.   BACK: No kyphosis or scoliosis; no CVA tenderness ABDOMEN: soft and non-tender; no masses, no organomegaly, normal abdominal bowel sounds; no pannus; no intertriginous candida. There is no rebound and no distention. Rectal Exam: Not done EXTREMITIES: No bone or joint deformity; age-appropriate arthropathy of the hands and knees; no edema; no ulcerations.  There is no calf tenderness. Genitalia: not examined PULSES: 2+ and symmetric SKIN: Normal hydration no rash or ulceration CNS: Cranial nerves 2-12 grossly intact no focal lateralizing neurologic deficit.  Speech is fluent; uvula elevated with phonation, facial symmetry and tongue midline. DTR are normal bilaterally, cerebella exam is intact, barbinski is negative and strengths are equaled bilaterally.  No sensory loss.   Labs on Admission:  Basic Metabolic Panel:  Recent Labs Lab 12/23/15 2100 12/23/15 2120 12/24/15 0405  NA 136 139 134*  K 3.6 3.5 3.5  CL 102 101 102  CO2 25  --  24  GLUCOSE 169* 174*  216*  BUN '10 9 9  '$ CREATININE 0.85 0.90 0.64  CALCIUM 9.0  --  8.6*   Liver Function Tests:  Recent Labs Lab 12/23/15 2100 12/24/15 0405  AST 33 24  ALT 25 22  ALKPHOS 115 108  BILITOT 0.4 0.5  PROT 8.1 7.6  ALBUMIN 3.5 3.3*   CBC:  Recent Labs Lab 12/23/15 2100 12/23/15 2120 12/24/15 0405  WBC 16.5*  --  15.0*  NEUTROABS 10.3*  --   --   HGB 12.4 13.9 11.6*  HCT 38.9 41.0 37.2  MCV 85.5  --  86.1  PLT 304  --  273   CBG:  Recent Labs Lab 12/24/15 0343 12/24/15 0717  GLUCAP 217* 255*     Radiological Exams on Admission: Ct Head Wo Contrast  12/24/2015  CLINICAL DATA:  History of stage IV metastatic cancer. Right chest pain and blood in stools. Headache. EXAM: CT HEAD WITHOUT CONTRAST TECHNIQUE: Contiguous axial images were obtained from the base of the  skull through the vertex without intravenous contrast. COMPARISON:  MRI brain 08/10/2015.  CT head 02/26/2015. FINDINGS: Ventricles and sulci appear symmetrical. No ventricular dilatation. No mass effect or midline shift. No abnormal extra-axial fluid collections. Gray-white matter junctions are distinct. Basal cisterns are not effaced. No evidence of acute intracranial hemorrhage. No depressed skull fractures. Visualized paranasal sinuses and mastoid air cells are not opacified. If there is suspicion for intracranial metastasis, contrast-enhanced MRI would be more sensitive for detection of small metastatic lesions. IMPRESSION: No acute intracranial abnormalities. No significant mass effect or focal lesion identified. Electronically Signed   By: Lucienne Capers M.D.   On: 12/24/2015 00:01   Ct Chest W Contrast  12/24/2015  CLINICAL DATA:  Stage IV colon cancer metastatic to lungs and liver. Right rib cage pain. Blood in stools. EXAM: CT CHEST, ABDOMEN, AND PELVIS WITH CONTRAST TECHNIQUE: Multidetector CT imaging of the chest, abdomen and pelvis was performed following the standard protocol during bolus administration of intravenous contrast. CONTRAST:  121m ISOVUE-300 IOPAMIDOL (ISOVUE-300) INJECTION 61% COMPARISON:  09/03/2015 FINDINGS: CT CHEST FINDINGS Mediastinum/Lymph Nodes: There is right pretracheal lymphadenopathy measuring 1.5 cm diameter, increasing since prior study. Subcarinal lymphadenopathy measures 1.4 cm, increasing since prior study. Normal heart size. Normal caliber thoracic aorta. Esophagus is decompressed. Lungs/Pleura: Multiple pulmonary metastases diffusely throughout both lungs. Largest lesion is in the right lung base posteriorly and measures 5.9 by 4.9 cm, increasing since previous study. No pleural effusions. No pneumothorax. Musculoskeletal: No chest wall mass or suspicious bone lesions identified. CT ABDOMEN PELVIS FINDINGS Hepatobiliary: Multiple low-attenuation metastases  throughout the liver. The lesion previously measured in the dome of the liver today measures 4.8 x 3.6 cm, representing significant increase in size. Previously measured lesion in the inferior right lobe of the liver today measures 3.8 by 2.7 cm, representing significant increase. Previous lesion at the junction of segments 4 and 8 today measures 3.3 x 3.4 cm, representing significant increase in size. The gallbladder is either contracted or surgically absent. No bile duct dilatation. Pancreas: No mass, inflammatory changes, or other significant abnormality. Spleen: Within normal limits in size and appearance. Adrenals/Urinary Tract: No masses identified. No evidence of hydronephrosis. Stomach/Bowel: No evidence of obstruction, inflammatory process, or abnormal fluid collections.There is a circumferential mass in the mid sigmoid colon, more prominent than on prior study. There is regional lymphadenopathy adjacent to the sigmoid colon measuring up to 2.1 cm in diameter, increasing since previous study. Vascular/Lymphatic: No pathologically enlarged lymph nodes. No evidence of  abdominal aortic aneurysm. Reproductive: Multiple uterine masses are likely to represent fibroids. No abnormal adnexal masses. Other: None. Musculoskeletal:  No suspicious bone lesions identified. IMPRESSION: Chest: Significant progression of multiple bilateral pulmonary metastases. Mild progression of mediastinal lymphadenopathy. Abdomen:  Significant progression of multiple hepatic metastases. Pelvis: Mass in the sigmoid colon, increasing in prominence since previous study. Increasing regional lymphadenopathy around the sigmoid colon. Electronically Signed   By: Lucienne Capers M.D.   On: 12/24/2015 00:12   Ct Abdomen Pelvis W Contrast  12/24/2015  CLINICAL DATA:  Stage IV colon cancer metastatic to lungs and liver. Right rib cage pain. Blood in stools. EXAM: CT CHEST, ABDOMEN, AND PELVIS WITH CONTRAST TECHNIQUE: Multidetector CT imaging  of the chest, abdomen and pelvis was performed following the standard protocol during bolus administration of intravenous contrast. CONTRAST:  14m ISOVUE-300 IOPAMIDOL (ISOVUE-300) INJECTION 61% COMPARISON:  09/03/2015 FINDINGS: CT CHEST FINDINGS Mediastinum/Lymph Nodes: There is right pretracheal lymphadenopathy measuring 1.5 cm diameter, increasing since prior study. Subcarinal lymphadenopathy measures 1.4 cm, increasing since prior study. Normal heart size. Normal caliber thoracic aorta. Esophagus is decompressed. Lungs/Pleura: Multiple pulmonary metastases diffusely throughout both lungs. Largest lesion is in the right lung base posteriorly and measures 5.9 by 4.9 cm, increasing since previous study. No pleural effusions. No pneumothorax. Musculoskeletal: No chest wall mass or suspicious bone lesions identified. CT ABDOMEN PELVIS FINDINGS Hepatobiliary: Multiple low-attenuation metastases throughout the liver. The lesion previously measured in the dome of the liver today measures 4.8 x 3.6 cm, representing significant increase in size. Previously measured lesion in the inferior right lobe of the liver today measures 3.8 by 2.7 cm, representing significant increase. Previous lesion at the junction of segments 4 and 8 today measures 3.3 x 3.4 cm, representing significant increase in size. The gallbladder is either contracted or surgically absent. No bile duct dilatation. Pancreas: No mass, inflammatory changes, or other significant abnormality. Spleen: Within normal limits in size and appearance. Adrenals/Urinary Tract: No masses identified. No evidence of hydronephrosis. Stomach/Bowel: No evidence of obstruction, inflammatory process, or abnormal fluid collections.There is a circumferential mass in the mid sigmoid colon, more prominent than on prior study. There is regional lymphadenopathy adjacent to the sigmoid colon measuring up to 2.1 cm in diameter, increasing since previous study. Vascular/Lymphatic: No  pathologically enlarged lymph nodes. No evidence of abdominal aortic aneurysm. Reproductive: Multiple uterine masses are likely to represent fibroids. No abnormal adnexal masses. Other: None. Musculoskeletal:  No suspicious bone lesions identified. IMPRESSION: Chest: Significant progression of multiple bilateral pulmonary metastases. Mild progression of mediastinal lymphadenopathy. Abdomen:  Significant progression of multiple hepatic metastases. Pelvis: Mass in the sigmoid colon, increasing in prominence since previous study. Increasing regional lymphadenopathy around the sigmoid colon. Electronically Signed   By: WLucienne CapersM.D.   On: 12/24/2015 00:12    Assessment/Plan Present on Admission:  . Rectal bleeding . Hypertension . 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 ([facility]  PLAN:  Rectal bleeding:  Will continue with serial H and H.  Await GI and Surgery consultations.  Her metastatic colon CA appeared to be progressing, and she has a mass in the mid sigmoid colon.  I suspect this is the source of her bleeding.  Will consult oncology also for further recommendation.   HTN:  Stable.  HA:  Unclear.  Could be analgesic rebound HA given she has been taking daily ASA.   Recent MRI negative.   Other plans as per orders. Code Status: FULL CHaskel Khan MD.  FRosalita ChessmanTriad  [facility] Pager [phone] 7pm to 7am.  12/24/2015, 8:06 AM

## 2015-12-24 NOTE — Consult Note (Signed)
Referring Provider: Dr. Marin Comment  Primary Care Physician:  No PCP Per Patient Primary Gastroenterologist:  Dr. Gala Romney (previously seen at [facility]   Date of Admission: 12/23/15 Date of Consultation: 12/24/15  Reason for Consultation:  Rectal Bleeding   HPI:  Erica Barker is a 55 y.o. year old female with a history of stage IV colon cancer, diagnosed in Aug 2015 at [facility] with CT showing a 3.6 cm apple core lesion in proximal sigmoid colon.   She was last seen by Dr. Whitney Muse Nov 23, 2015. CT chest this admission shows significant progression of pulmonary and hepatic metastases and increasing prominence of sigmoid colon mass since previous study Sep 03, 2015.   Presented this admission with rectal bleeding in the setting of Xarelto for history of upper extremity DVT in 2015. Hgb appears to be historically in the 13 range, with mild drift to mid 11 range. She states she noted acute onset of rectal bleeding, clots, late Saturday. Looked like "a menstrual cycle". Believes she had a colonoscopy at [facility] in 2015, but she is unable to tell me which physician. Poor historian. Last does of Xarelto yesterday morning. Noted pain in RUQ on Saturday. Took Excedrin and got better. Denies rectal pain, burning. No further abdominal pain. No N/V. Taking oral chemo agent currently. Small amount of bleeding this morning at 1am.   Past Medical History  Diagnosis Date  . Hypertension   . Depression   . Pulmonary nodules 01/30/14    CTA CHEST  . Lymphadenopathy, mediastinal 01/2014    [place]  . Morbid obesity ([place])   . Diabetes mellitus, type II ([place])   . Headache(784.0)   . Medically noncompliant 09/06/2015  . Cancer ([place])     stage 4 colon with mets to liver and lung    Past Surgical History  Procedure Laterality Date  . Cholecystectomy    . Video bronchoscopy with endobronchial ultrasound N/A 02/06/2014    Procedure: VIDEO BRONCHOSCOPY WITH ENDOBRONCHIAL  ULTRASOUND,bronchial biopsies , node sampling;  Surgeon: Melrose Nakayama, MD;  Location: [place];  Service: Thoracic;  Laterality: N/A;  . Tubal ligation      Prior to Admission medications   Medication Sig Start Date End Date Taking? Authorizing Provider  aspirin-acetaminophen-caffeine (EXCEDRIN MIGRAINE) [phone] MG per tablet Take 2 tablets by mouth every 6 (six) hours as needed for headache.   Yes Historical Provider, MD  baclofen (LIORESAL) 10 MG tablet Take 10 mg by mouth 2 (two) times daily as needed for muscle spasms.   Yes Historical Provider, MD  dexlansoprazole (DEXILANT) 60 MG capsule Take 1 capsule (60 mg total) by mouth daily. 10/01/15  Yes Manon Hilding Kefalas, PA-C  gabapentin (NEURONTIN) 300 MG capsule Take 300 mg by mouth at bedtime.   Yes Historical Provider, MD  lisinopril (PRINIVIL,ZESTRIL) 10 MG tablet Take 1 tablet (10 mg total) by mouth daily. 10/20/14  Yes Patrici Ranks, MD  metFORMIN (GLUCOPHAGE) 500 MG tablet Take 1 tablet (500 mg total) by mouth daily with breakfast. 07/17/15  Yes Patrici Ranks, MD  Multiple Vitamin (MULTIVITAMIN) capsule Take 1 capsule by mouth daily.   Yes Historical Provider, MD  oxyCODONE-acetaminophen (PERCOCET/ROXICET) 5-325 MG tablet Take 1 tablet by mouth every 4 (four) hours as needed for severe pain. 11/09/15  Yes Patrici Ranks, MD  potassium chloride SA (K-DUR,KLOR-CON) 20 MEQ tablet Take 2 tablets (40 mEq total) by mouth daily. 11/23/15  Yes Baird Cancer, PA-C  regorafenib (STIVARGA) 40  MG tablet Take 4 tablets (160 mg total) by mouth daily with breakfast. Take with low fat meal. Caution: Chemotherapy. 11/27/15  Yes Baird Cancer, PA-C  rivaroxaban (XARELTO) 20 MG TABS tablet Take 1 tablet (20 mg total) by mouth daily with supper. 07/17/15  Yes Patrici Ranks, MD  sucralfate (CARAFATE) 1 GM/10ML suspension Take 10 mLs (1 g total) by mouth 4 (four) times daily -  with meals and at bedtime. 11/09/15  Yes Patrici Ranks, MD     Current Facility-Administered Medications  Medication Dose Route Frequency Provider Last Rate Last Dose  . 0.9 %  sodium chloride infusion   Intravenous Continuous Rise Patience, MD 100 mL/hr at 12/24/15 1200    . acetaminophen (TYLENOL) tablet 650 mg  650 mg Oral Q6H PRN Rise Patience, MD   650 mg at 12/24/15 0815   Or  . acetaminophen (TYLENOL) suppository 650 mg  650 mg Rectal Q6H PRN Rise Patience, MD      . baclofen (LIORESAL) tablet 10 mg  10 mg Oral BID PRN Rise Patience, MD      . famotidine (PEPCID) IVPB 20 mg premix  20 mg Intravenous Q12H Rise Patience, MD   20 mg at 12/24/15 1028  . gabapentin (NEURONTIN) capsule 300 mg  300 mg Oral QHS Rise Patience, MD      . hydrALAZINE (APRESOLINE) injection 10 mg  10 mg Intravenous Q4H PRN Rise Patience, MD      . insulin aspart (novoLOG) injection 0-9 Units  0-9 Units Subcutaneous Q4H Rise Patience, MD   2 Units at 12/24/15 1148  . morphine 2 MG/ML injection 2 mg  2 mg Intravenous Q4H PRN Rise Patience, MD      . ondansetron [facility]) tablet 4 mg  4 mg Oral Q6H PRN Rise Patience, MD       Or  . ondansetron [facility]) injection 4 mg  4 mg Intravenous Q6H PRN Rise Patience, MD      . sucralfate (CARAFATE) 1 GM/10ML suspension 1 g  1 g Oral TID WC & HS Rise Patience, MD   1 g at 12/24/15 1148    Allergies as of 12/23/2015 - Review Complete 12/23/2015  Allergen Reaction Noted  . Asa [aspirin] Hives 02/03/2014    Family History  Problem Relation Age of Onset  . Cancer Mother     UTERINE  . Cancer Father     LEUKEMIA  . Crohn's disease Son   . Colon cancer Neg Hx     Social History   Social History  . Marital Status: Widowed    Spouse Name: N/A  . Number of Children: N/A  . Years of Education: N/A   Occupational History  . Not on file.   Social History Main Topics  . Smoking status: Former Smoker -- 0.50 packs/day for 7 years    Types: Cigarettes     Quit date: 02/03/1989  . Smokeless tobacco: Never Used  . Alcohol Use: Yes     Comment: occasional  . Drug Use: No  . Sexual Activity: Not on file   Other Topics Concern  . Not on file   Social History Narrative    Review of Systems: Gen: Denies fever, chills, loss of appetite, change in weight or weight loss CV: Denies chest pain, heart palpitations, syncope, edema  Resp: Denies shortness of breath with rest, cough, wheezing GI: see HPI  GU : Denies urinary  burning, urinary frequency, urinary incontinence.  MS: +joint pain  Derm: Denies rash, itching, dry skin Psych: Denies depression, anxiety,confusion, or memory loss Heme: see HPI   Physical Exam: Vital signs in last 24 hours: Temp:  [97.9 F (36.6 C)-99.3 F (37.4 C)] 98.5 F (36.9 C) (06/12 1137) Pulse Rate:  [76-97] 76 (06/12 1200) Resp:  [18-24] 21 (06/12 1200) BP: (129-157)/(89-110) 144/103 mmHg (06/12 1200) SpO2:  [93 %-100 %] 98 % (06/12 1200) Weight:  [245 lb 2.4 oz (111.2 kg)-250 lb (113.399 kg)] 245 lb 2.4 oz (111.2 kg) (06/12 0337) Last BM Date: 12/24/15 General:   Alert,  Well-developed, well-nourished, pleasant and cooperative in NAD Head:  Normocephalic and atraumatic. Eyes:  Sclera clear, no icterus.   Conjunctiva pink. Ears:  Normal auditory acuity. Nose:  No deformity, discharge,  or lesions. Mouth:  No deformity or lesions, dentition normal. Lungs:  Clear throughout to auscultation.   No wheezes, crackles, or rhonchi. No acute distress. Heart:  Regular rate and rhythm; no murmurs, clicks, rubs,  or gallops. Abdomen:  Soft, nontender and nondistended. No masses, hepatosplenomegaly or hernias noted. Normal bowel sounds, without guarding, and without rebound.   Rectal:  Deferred    Msk:  Symmetrical without gross deformities. Normal posture. Extremities:  Without  edema. Neurologic:  Alert and  oriented x4;  grossly normal neurologically. Psych:  Alert and cooperative. Normal mood and  affect.  Intake/Output from previous day:   Intake/Output this shift: Total I/O In: 975 [P.O.:120; I.V.:805; IV Piggyback:50] Out: -   Lab Results:  Recent Labs  12/23/15 2100 12/23/15 2120 12/24/15 0405 12/24/15 0821  WBC 16.5*  --  15.0* 13.9*  HGB 12.4 13.9 11.6* 11.5*  HCT 38.9 41.0 37.2 35.6*  PLT 304  --  273 283   BMET  Recent Labs  12/23/15 2100 12/23/15 2120 12/24/15 0405  NA 136 139 134*  K 3.6 3.5 3.5  CL 102 101 102  CO2 25  --  24  GLUCOSE 169* 174* 216*  BUN '10 9 9  '$ CREATININE 0.85 0.90 0.64  CALCIUM 9.0  --  8.6*   LFT  Recent Labs  12/23/15 2100 12/24/15 0405  PROT 8.1 7.6  ALBUMIN 3.5 3.3*  AST 33 24  ALT 25 22  ALKPHOS 115 108  BILITOT 0.4 0.5   PT/INR  Recent Labs  12/24/15 0405  LABPROT 19.0*  INR 1.59*    Studies/Results: Ct Head Wo Contrast  12/24/2015  CLINICAL DATA:  History of stage IV metastatic cancer. Right chest pain and blood in stools. Headache. EXAM: CT HEAD WITHOUT CONTRAST TECHNIQUE: Contiguous axial images were obtained from the base of the skull through the vertex without intravenous contrast. COMPARISON:  MRI brain 08/10/2015.  CT head 02/26/2015. FINDINGS: Ventricles and sulci appear symmetrical. No ventricular dilatation. No mass effect or midline shift. No abnormal extra-axial fluid collections. Gray-white matter junctions are distinct. Basal cisterns are not effaced. No evidence of acute intracranial hemorrhage. No depressed skull fractures. Visualized paranasal sinuses and mastoid air cells are not opacified. If there is suspicion for intracranial metastasis, contrast-enhanced MRI would be more sensitive for detection of small metastatic lesions. IMPRESSION: No acute intracranial abnormalities. No significant mass effect or focal lesion identified. Electronically Signed   By: Lucienne Capers M.D.   On: 12/24/2015 00:01   Ct Chest W Contrast  12/24/2015  CLINICAL DATA:  Stage IV colon cancer metastatic to lungs  and liver. Right rib cage pain. Blood in stools. EXAM: CT CHEST,  ABDOMEN, AND PELVIS WITH CONTRAST TECHNIQUE: Multidetector CT imaging of the chest, abdomen and pelvis was performed following the standard protocol during bolus administration of intravenous contrast. CONTRAST:  168m ISOVUE-300 IOPAMIDOL (ISOVUE-300) INJECTION 61% COMPARISON:  09/03/2015 FINDINGS: CT CHEST FINDINGS Mediastinum/Lymph Nodes: There is right pretracheal lymphadenopathy measuring 1.5 cm diameter, increasing since prior study. Subcarinal lymphadenopathy measures 1.4 cm, increasing since prior study. Normal heart size. Normal caliber thoracic aorta. Esophagus is decompressed. Lungs/Pleura: Multiple pulmonary metastases diffusely throughout both lungs. Largest lesion is in the right lung base posteriorly and measures 5.9 by 4.9 cm, increasing since previous study. No pleural effusions. No pneumothorax. Musculoskeletal: No chest wall mass or suspicious bone lesions identified. CT ABDOMEN PELVIS FINDINGS Hepatobiliary: Multiple low-attenuation metastases throughout the liver. The lesion previously measured in the dome of the liver today measures 4.8 x 3.6 cm, representing significant increase in size. Previously measured lesion in the inferior right lobe of the liver today measures 3.8 by 2.7 cm, representing significant increase. Previous lesion at the junction of segments 4 and 8 today measures 3.3 x 3.4 cm, representing significant increase in size. The gallbladder is either contracted or surgically absent. No bile duct dilatation. Pancreas: No mass, inflammatory changes, or other significant abnormality. Spleen: Within normal limits in size and appearance. Adrenals/Urinary Tract: No masses identified. No evidence of hydronephrosis. Stomach/Bowel: No evidence of obstruction, inflammatory process, or abnormal fluid collections.There is a circumferential mass in the mid sigmoid colon, more prominent than on prior study. There is regional  lymphadenopathy adjacent to the sigmoid colon measuring up to 2.1 cm in diameter, increasing since previous study. Vascular/Lymphatic: No pathologically enlarged lymph nodes. No evidence of abdominal aortic aneurysm. Reproductive: Multiple uterine masses are likely to represent fibroids. No abnormal adnexal masses. Other: None. Musculoskeletal:  No suspicious bone lesions identified. IMPRESSION: Chest: Significant progression of multiple bilateral pulmonary metastases. Mild progression of mediastinal lymphadenopathy. Abdomen:  Significant progression of multiple hepatic metastases. Pelvis: Mass in the sigmoid colon, increasing in prominence since previous study. Increasing regional lymphadenopathy around the sigmoid colon. Electronically Signed   By: WLucienne CapersM.D.   On: 12/24/2015 00:12   Ct Abdomen Pelvis W Contrast  12/24/2015  CLINICAL DATA:  Stage IV colon cancer metastatic to lungs and liver. Right rib cage pain. Blood in stools. EXAM: CT CHEST, ABDOMEN, AND PELVIS WITH CONTRAST TECHNIQUE: Multidetector CT imaging of the chest, abdomen and pelvis was performed following the standard protocol during bolus administration of intravenous contrast. CONTRAST:  1020mISOVUE-300 IOPAMIDOL (ISOVUE-300) INJECTION 61% COMPARISON:  09/03/2015 FINDINGS: CT CHEST FINDINGS Mediastinum/Lymph Nodes: There is right pretracheal lymphadenopathy measuring 1.5 cm diameter, increasing since prior study. Subcarinal lymphadenopathy measures 1.4 cm, increasing since prior study. Normal heart size. Normal caliber thoracic aorta. Esophagus is decompressed. Lungs/Pleura: Multiple pulmonary metastases diffusely throughout both lungs. Largest lesion is in the right lung base posteriorly and measures 5.9 by 4.9 cm, increasing since previous study. No pleural effusions. No pneumothorax. Musculoskeletal: No chest wall mass or suspicious bone lesions identified. CT ABDOMEN PELVIS FINDINGS Hepatobiliary: Multiple low-attenuation  metastases throughout the liver. The lesion previously measured in the dome of the liver today measures 4.8 x 3.6 cm, representing significant increase in size. Previously measured lesion in the inferior right lobe of the liver today measures 3.8 by 2.7 cm, representing significant increase. Previous lesion at the junction of segments 4 and 8 today measures 3.3 x 3.4 cm, representing significant increase in size. The gallbladder is either contracted or surgically absent. No bile duct  dilatation. Pancreas: No mass, inflammatory changes, or other significant abnormality. Spleen: Within normal limits in size and appearance. Adrenals/Urinary Tract: No masses identified. No evidence of hydronephrosis. Stomach/Bowel: No evidence of obstruction, inflammatory process, or abnormal fluid collections.There is a circumferential mass in the mid sigmoid colon, more prominent than on prior study. There is regional lymphadenopathy adjacent to the sigmoid colon measuring up to 2.1 cm in diameter, increasing since previous study. Vascular/Lymphatic: No pathologically enlarged lymph nodes. No evidence of abdominal aortic aneurysm. Reproductive: Multiple uterine masses are likely to represent fibroids. No abnormal adnexal masses. Other: None. Musculoskeletal:  No suspicious bone lesions identified. IMPRESSION: Chest: Significant progression of multiple bilateral pulmonary metastases. Mild progression of mediastinal lymphadenopathy. Abdomen:  Significant progression of multiple hepatic metastases. Pelvis: Mass in the sigmoid colon, increasing in prominence since previous study. Increasing regional lymphadenopathy around the sigmoid colon. Electronically Signed   By: Lucienne Capers M.D.   On: 12/24/2015 00:12    Impression: 55 year old very pleasant female with history of stage IV colon cancer, progressive disease with CT this admission noting significant progression of pulmonary and hepatic metastases, along with sigmoid colon  mass increasing in prominence since prior study Feb 2017, now presenting with rectal bleeding that does not appear to be hemodynamically significant. Likely culprit is known carcinoma, compounded by presence of anticoagulation for history of DVT. Her prior lower GI evaluation is unclear, and she is a poor historian but does note a possible colonoscopy in 2015 at [facility]. We will request this records. Regardless, would avoid repeat lower GI evaluation unless she developed a hemodynamically significant bleed. Appear she has significant progression of her disease, unfortunately. Recommend a conservative approach in this situation and discontinuation of Xarelto. May advance diet.   Plan: Clear liquids Follow serial H/H Supportive measures Will attempt to retrieve any outside reports from [facility] on invasive evaluation unless clinical situation changes Avoid Excedrin and other NSAIDs, aspirin powders    Orvil Feil, ANP-BC [facility]     LOS: 0 days    12/24/2015, 12:42 PM

## 2015-12-24 NOTE — ED Notes (Signed)
Unable to draw back on part- port will flush, but will not draw. Rns x 3 attempted to access port without success. Pt refuses to allow peripheral draw by lab or ED staff. Tim RN [facility] in to speak with pt but still she declines to have labs drawn in spite of large clots when going to the bathroom

## 2015-12-24 NOTE — ED Notes (Signed)
hospitalist in to assess- informed of pt reluctance to have blood drawn peripherally- Per hospitalist, wait allow fluids to run for a bit, then try to draw labs from port

## 2015-12-25 ENCOUNTER — Encounter (HOSPITAL_COMMUNITY): Payer: Self-pay | Admitting: Lab

## 2015-12-25 DIAGNOSIS — E119 Type 2 diabetes mellitus without complications: Secondary | ICD-10-CM | POA: Diagnosis not present

## 2015-12-25 DIAGNOSIS — C787 Secondary malignant neoplasm of liver and intrahepatic bile duct: Secondary | ICD-10-CM | POA: Diagnosis not present

## 2015-12-25 DIAGNOSIS — C189 Malignant neoplasm of colon, unspecified: Secondary | ICD-10-CM | POA: Diagnosis not present

## 2015-12-25 DIAGNOSIS — K625 Hemorrhage of anus and rectum: Secondary | ICD-10-CM | POA: Diagnosis not present

## 2015-12-25 LAB — GLUCOSE, CAPILLARY
GLUCOSE-CAPILLARY: 167 mg/dL — AB (ref 65–99)
GLUCOSE-CAPILLARY: 172 mg/dL — AB (ref 65–99)
Glucose-Capillary: 164 mg/dL — ABNORMAL HIGH (ref 65–99)

## 2015-12-25 MED ORDER — HEPARIN SOD (PORK) LOCK FLUSH 100 UNIT/ML IV SOLN
500.0000 [IU] | Freq: Once | INTRAVENOUS | Status: DC
  Filled 2015-12-25: qty 5

## 2015-12-25 NOTE — Progress Notes (Signed)
Pt d/c home via wheelchair. D/C instructions and education given. All questions and concerns answered. Belongings w/ pt. Mediport heplocked and deaccessed, Dressing placed.

## 2015-12-25 NOTE — Progress Notes (Signed)
Referral sent to [facility] Pia Mau 6/19 '@11'$ 

## 2015-12-25 NOTE — Discharge Summary (Signed)
Physician Discharge Summary  Erica Barker YTK:[id number] DOB: September 11, 1960 DOA: 12/23/2015  PCP: No PCP Per Patient  Admit date: 12/23/2015 Discharge date: 12/25/2015  Time spent: 35 minutes  Recommendations for Outpatient Follow-up:  1. Follow up with PCP in one week. 2. Follow up with Dr Whitney Muse as scheduled.    Discharge Diagnoses:  Principal Problem:   Rectal bleeding Active Problems:   Hypertension   Diabetes mellitus type 2 in obese [facility])   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 [facility])   Colon cancer metastasized to liver [facility])   Malignant neoplasm of colon [facility])   Discharge Condition:  Bleeding improved.  Cancer was found to have progressed.   Diet recommendation:  As tolerated.   Filed Weights   12/23/15 2011 12/24/15 0337 12/25/15 0500  Weight: 113.399 kg (250 lb) 111.2 kg (245 lb 2.4 oz) 110.6 kg (243 lb 13.3 oz)    History of present illness: Patient was admitted by Dr Hal Hope on December 24, 2015 for rectal bleeding.  As per his H and P:  " Erica Barker is a 55 y.o. 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 Patient's symptoms started on 12/22/2015, 36 hours ago with a right upper quadrant pain and patient started noticing frank bleeding after each bowel movement. Has been having some nausea denies any vomiting chest pain or shortness of breath. Patient uses Excedrin for headache which also contains aspirin. Denies use of any other NSAIDs. Patient's last colonoscopy was 2 years ago probably at [facility], records of which are not available at this time. Patient is on Xarelto for right upper extremity DVT which was diagnosed in 2015. Patient at this time is hemodynamically stable. CT of the abdomen and pelvis chest and head was done which shows progression of patient's known metastasis in the lung and  abdomen with pelvic mass. Patient has been admitted for further management of rectal bleeding and abdominal pain.    Hospital Course: Erica Barker is an 55 y.o. 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 Her Xarelto was stopped, and GI and surgery were consulted. She has no complaints except for her HA.Dr Arnoldo Morale of surgery recommended conservative Tx, and to d/c Xarelto.  GI recommended to d/c Xarelto, and felt that it is safe for her to be discharged today.  She was also seen by Dr Whitney Muse, who unfortunately felt that her treatment going forward is limited.  She will make arrangement trying to get Erica Barker into a clinical trial at [facility].  She is hoping that Erica Barker can be seen next week.  She will see Dr Whitney Muse on Friday.  Xarelto should be discontinued indefinitely, unless there is new finding requiring further anticoagulation.  Thank you and Good Day.    Consultations:  GI, Surgery, and Oncology.   Discharge Exam: Filed Vitals:   12/25/15 0745 12/25/15 0800  BP:  140/106  Pulse:  81  Temp: 99.1 F (37.3 C)   Resp:  10     Discharge Instructions   Discharge Instructions    Diet - low sodium heart healthy    Complete by:  As directed      Discharge instructions  Complete by:  As directed   Avoid NSAIDS, avoid ASA, and stop Xarelto indefinitely.   Follow up with Dr Whitney Muse for further tx on your cancer.     Increase activity slowly    Complete by:  As directed           Current Discharge Medication List    CONTINUE these medications which have NOT CHANGED   Details  baclofen (LIORESAL) 10 MG tablet Take 10 mg by mouth 2 (two) times daily as needed for muscle spasms.    dexlansoprazole (DEXILANT) 60 MG capsule Take 1 capsule (60 mg total) by mouth daily. Qty: 30 capsule,  Refills: 5   Associated Diagnoses: Gastroesophageal reflux disease, esophagitis presence not specified    gabapentin (NEURONTIN) 300 MG capsule Take 300 mg by mouth at bedtime.    lisinopril (PRINIVIL,ZESTRIL) 10 MG tablet Take 1 tablet (10 mg total) by mouth daily. Qty: 30 tablet, Refills: 6    metFORMIN (GLUCOPHAGE) 500 MG tablet Take 1 tablet (500 mg total) by mouth daily with breakfast. Qty: 30 tablet, Refills: 3    Multiple Vitamin (MULTIVITAMIN) capsule Take 1 capsule by mouth daily.    oxyCODONE-acetaminophen (PERCOCET/ROXICET) 5-325 MG tablet Take 1 tablet by mouth every 4 (four) hours as needed for severe pain. Qty: 60 tablet, Refills: 0   Associated Diagnoses: 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 ([place]); Pulmonary nodules    potassium chloride SA (K-DUR,KLOR-CON) 20 MEQ tablet Take 2 tablets (40 mEq total) by mouth daily. Qty: 60 tablet, Refills: 0   Associated Diagnoses: Hypokalemia    regorafenib (STIVARGA) 40 MG tablet Take 4 tablets (160 mg total) by mouth daily with breakfast. Take with low fat meal. Caution: Chemotherapy. Qty: 84 tablet, Refills: 0   Associated Diagnoses: 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 ([facility])    sucralfate (CARAFATE) 1 GM/10ML suspension Take 10 mLs (1 g total) by mouth 4 (four) times daily -  with meals and at bedtime. Qty: 420 mL, Refills: 1   Associated Diagnoses: 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 [facility])      STOP taking these medications     aspirin-acetaminophen-caffeine (EXCEDRIN MIGRAINE) 250-250-65 MG per tablet      rivaroxaban (XARELTO) 20 MG TABS tablet        Allergies  Allergen Reactions  . Asa [Aspirin] Hives      The results of significant diagnostics from this hospitalization (including imaging, microbiology, ancillary and laboratory) are listed below for reference.    Significant Diagnostic Studies: Ct Head Wo Contrast  12/24/2015  CLINICAL DATA:  History of stage IV metastatic cancer. Right chest pain and blood in  stools. Headache. EXAM: CT HEAD WITHOUT CONTRAST TECHNIQUE: Contiguous axial images were obtained from the base of the skull through the vertex without intravenous contrast. COMPARISON:  MRI brain 08/10/2015.  CT head 02/26/2015. FINDINGS: Ventricles and sulci appear symmetrical. No ventricular dilatation. No mass effect or midline shift. No abnormal extra-axial fluid collections. Gray-white matter junctions are distinct. Basal cisterns are not effaced. No evidence of acute intracranial hemorrhage. No depressed skull fractures. Visualized paranasal sinuses and mastoid air cells are not opacified. If there is suspicion for intracranial metastasis, contrast-enhanced MRI would be more sensitive for detection of small metastatic lesions. IMPRESSION: No acute intracranial abnormalities. No significant mass effect or focal lesion identified. Electronically Signed   By: Lucienne Capers M.D.   On: 12/24/2015 00:01   Ct Chest W Contrast  12/24/2015  CLINICAL DATA:  Stage IV colon cancer metastatic to lungs and liver.  Right rib cage pain. Blood in stools. EXAM: CT CHEST, ABDOMEN, AND PELVIS WITH CONTRAST TECHNIQUE: Multidetector CT imaging of the chest, abdomen and pelvis was performed following the standard protocol during bolus administration of intravenous contrast. CONTRAST:  114m ISOVUE-300 IOPAMIDOL (ISOVUE-300) INJECTION 61% COMPARISON:  09/03/2015 FINDINGS: CT CHEST FINDINGS Mediastinum/Lymph Nodes: There is right pretracheal lymphadenopathy measuring 1.5 cm diameter, increasing since prior study. Subcarinal lymphadenopathy measures 1.4 cm, increasing since prior study. Normal heart size. Normal caliber thoracic aorta. Esophagus is decompressed. Lungs/Pleura: Multiple pulmonary metastases diffusely throughout both lungs. Largest lesion is in the right lung base posteriorly and measures 5.9 by 4.9 cm, increasing since previous study. No pleural effusions. No pneumothorax. Musculoskeletal: No chest wall mass or  suspicious bone lesions identified. CT ABDOMEN PELVIS FINDINGS Hepatobiliary: Multiple low-attenuation metastases throughout the liver. The lesion previously measured in the dome of the liver today measures 4.8 x 3.6 cm, representing significant increase in size. Previously measured lesion in the inferior right lobe of the liver today measures 3.8 by 2.7 cm, representing significant increase. Previous lesion at the junction of segments 4 and 8 today measures 3.3 x 3.4 cm, representing significant increase in size. The gallbladder is either contracted or surgically absent. No bile duct dilatation. Pancreas: No mass, inflammatory changes, or other significant abnormality. Spleen: Within normal limits in size and appearance. Adrenals/Urinary Tract: No masses identified. No evidence of hydronephrosis. Stomach/Bowel: No evidence of obstruction, inflammatory process, or abnormal fluid collections.There is a circumferential mass in the mid sigmoid colon, more prominent than on prior study. There is regional lymphadenopathy adjacent to the sigmoid colon measuring up to 2.1 cm in diameter, increasing since previous study. Vascular/Lymphatic: No pathologically enlarged lymph nodes. No evidence of abdominal aortic aneurysm. Reproductive: Multiple uterine masses are likely to represent fibroids. No abnormal adnexal masses. Other: None. Musculoskeletal:  No suspicious bone lesions identified. IMPRESSION: Chest: Significant progression of multiple bilateral pulmonary metastases. Mild progression of mediastinal lymphadenopathy. Abdomen:  Significant progression of multiple hepatic metastases. Pelvis: Mass in the sigmoid colon, increasing in prominence since previous study. Increasing regional lymphadenopathy around the sigmoid colon. Electronically Signed   By: WLucienne CapersM.D.   On: 12/24/2015 00:12   Ct Abdomen Pelvis W Contrast  12/24/2015  CLINICAL DATA:  Stage IV colon cancer metastatic to lungs and liver. Right rib  cage pain. Blood in stools. EXAM: CT CHEST, ABDOMEN, AND PELVIS WITH CONTRAST TECHNIQUE: Multidetector CT imaging of the chest, abdomen and pelvis was performed following the standard protocol during bolus administration of intravenous contrast. CONTRAST:  1058mISOVUE-300 IOPAMIDOL (ISOVUE-300) INJECTION 61% COMPARISON:  09/03/2015 FINDINGS: CT CHEST FINDINGS Mediastinum/Lymph Nodes: There is right pretracheal lymphadenopathy measuring 1.5 cm diameter, increasing since prior study. Subcarinal lymphadenopathy measures 1.4 cm, increasing since prior study. Normal heart size. Normal caliber thoracic aorta. Esophagus is decompressed. Lungs/Pleura: Multiple pulmonary metastases diffusely throughout both lungs. Largest lesion is in the right lung base posteriorly and measures 5.9 by 4.9 cm, increasing since previous study. No pleural effusions. No pneumothorax. Musculoskeletal: No chest wall mass or suspicious bone lesions identified. CT ABDOMEN PELVIS FINDINGS Hepatobiliary: Multiple low-attenuation metastases throughout the liver. The lesion previously measured in the dome of the liver today measures 4.8 x 3.6 cm, representing significant increase in size. Previously measured lesion in the inferior right lobe of the liver today measures 3.8 by 2.7 cm, representing significant increase. Previous lesion at the junction of segments 4 and 8 today measures 3.3 x 3.4 cm, representing significant increase in size. The  gallbladder is either contracted or surgically absent. No bile duct dilatation. Pancreas: No mass, inflammatory changes, or other significant abnormality. Spleen: Within normal limits in size and appearance. Adrenals/Urinary Tract: No masses identified. No evidence of hydronephrosis. Stomach/Bowel: No evidence of obstruction, inflammatory process, or abnormal fluid collections.There is a circumferential mass in the mid sigmoid colon, more prominent than on prior study. There is regional lymphadenopathy adjacent  to the sigmoid colon measuring up to 2.1 cm in diameter, increasing since previous study. Vascular/Lymphatic: No pathologically enlarged lymph nodes. No evidence of abdominal aortic aneurysm. Reproductive: Multiple uterine masses are likely to represent fibroids. No abnormal adnexal masses. Other: None. Musculoskeletal:  No suspicious bone lesions identified. IMPRESSION: Chest: Significant progression of multiple bilateral pulmonary metastases. Mild progression of mediastinal lymphadenopathy. Abdomen:  Significant progression of multiple hepatic metastases. Pelvis: Mass in the sigmoid colon, increasing in prominence since previous study. Increasing regional lymphadenopathy around the sigmoid colon. Electronically Signed   By: Lucienne Capers M.D.   On: 12/24/2015 00:12    Microbiology: Recent Results (from the past 240 hour(s))  MRSA PCR Screening     Status: Abnormal   Collection Time: 12/24/15  3:28 AM  Result Value Ref Range Status   MRSA by PCR INVALID RESULTS, SPECIMEN SENT FOR CULTURE (A) NEGATIVE Final    Comment: HEITGER J. AT 1610R ON [id number] BY THOMPSON S.        The GeneXpert MRSA Assay (FDA approved for NASAL specimens only), is one component of a comprehensive MRSA colonization surveillance program. It is not intended to diagnose MRSA infection nor to guide or monitor treatment for MRSA infections.      Labs: Basic Metabolic Panel:  Recent Labs Lab 12/23/15 2100 12/23/15 2120 12/24/15 0405  NA 136 139 134*  K 3.6 3.5 3.5  CL 102 101 102  CO2 25  --  24  GLUCOSE 169* 174* 216*  BUN '10 9 9  '$ CREATININE 0.85 0.90 0.64  CALCIUM 9.0  --  8.6*   Liver Function Tests:  Recent Labs Lab 12/23/15 2100 12/24/15 0405  AST 33 24  ALT 25 22  ALKPHOS 115 108  BILITOT 0.4 0.5  PROT 8.1 7.6  ALBUMIN 3.5 3.3*   CBC:  Recent Labs Lab 12/23/15 2100 12/23/15 2120 12/24/15 0405 12/24/15 0821  WBC 16.5*  --  15.0* 14.7*  NEUTROABS 10.3*  --   --   --   HGB 12.4  13.9 11.6* 12.1  HCT 38.9 41.0 37.2 38.7  MCV 85.5  --  86.1 89.6  PLT 304  --  273 296    CBG:  Recent Labs Lab 12/24/15 1625 12/24/15 1925 12/25/15 0021 12/25/15 0354 12/25/15 0724  GLUCAP 147* 189* 167* 172* 164*   Signed:  Gwenneth Whiteman MD. FACP [facility] 12/25/2015, 10:01 AM

## 2015-12-25 NOTE — Progress Notes (Signed)
REVIEWED-NO ADDITIONAL RECOMMENDATIONS.   Subjective: No further rectal bleeding. No abdominal pain. Wants to eat. Wants to go home.   Objective: Vital signs in last 24 hours: Temp:  [97.5 F (36.4 C)-99.4 F (37.4 C)] 99.1 F (37.3 C) (06/13 0745) Pulse Rate:  [74-89] 77 (06/13 0700) Resp:  [15-25] 18 (06/13 0700) BP: (118-163)/(85-112) 121/87 mmHg (06/13 0700) SpO2:  [94 %-100 %] 98 % (06/13 0700) Weight:  [243 lb 13.3 oz (110.6 kg)] 243 lb 13.3 oz (110.6 kg) (06/13 0500) Last BM Date: 12/24/15 General:   Alert and oriented, pleasant Head:  Normocephalic and atraumatic. Abdomen:  Bowel sounds present, soft, non-tender, non-distended. No HSM or hernias noted. No rebound or guarding. No masses appreciated  Msk:  Symmetrical without gross deformities. Normal posture. Extremities:  Without edema. Neurologic:  Alert and  oriented x4;  grossly normal neurologically. Psych:  Alert and cooperative. Normal mood and affect.  Intake/Output from previous day: 06/12 0701 - 06/13 0700 In: 1765 [P.O.:360; I.V.:1305; IV Piggyback:100] Out: -  Intake/Output this shift:    Lab Results:  Recent Labs  12/23/15 2100 12/23/15 2120 12/24/15 0405 12/24/15 0821  WBC 16.5*  --  15.0* 14.7*  HGB 12.4 13.9 11.6* 12.1  HCT 38.9 41.0 37.2 38.7  PLT 304  --  273 296   BMET  Recent Labs  12/23/15 2100 12/23/15 2120 12/24/15 0405  NA 136 139 134*  K 3.6 3.5 3.5  CL 102 101 102  CO2 25  --  24  GLUCOSE 169* 174* 216*  BUN '10 9 9  '$ CREATININE 0.85 0.90 0.64  CALCIUM 9.0  --  8.6*   LFT  Recent Labs  12/23/15 2100 12/24/15 0405  PROT 8.1 7.6  ALBUMIN 3.5 3.3*  AST 33 24  ALT 25 22  ALKPHOS 115 108  BILITOT 0.4 0.5   PT/INR  Recent Labs  12/24/15 0405  LABPROT 19.0*  INR 1.59*     Studies/Results: Ct Head Wo Contrast  12/24/2015  CLINICAL DATA:  History of stage IV metastatic cancer. Right chest pain and blood in stools. Headache. EXAM: CT HEAD WITHOUT CONTRAST  TECHNIQUE: Contiguous axial images were obtained from the base of the skull through the vertex without intravenous contrast. COMPARISON:  MRI brain 08/10/2015.  CT head 02/26/2015. FINDINGS: Ventricles and sulci appear symmetrical. No ventricular dilatation. No mass effect or midline shift. No abnormal extra-axial fluid collections. Gray-white matter junctions are distinct. Basal cisterns are not effaced. No evidence of acute intracranial hemorrhage. No depressed skull fractures. Visualized paranasal sinuses and mastoid air cells are not opacified. If there is suspicion for intracranial metastasis, contrast-enhanced MRI would be more sensitive for detection of small metastatic lesions. IMPRESSION: No acute intracranial abnormalities. No significant mass effect or focal lesion identified. Electronically Signed   By: Lucienne Capers M.D.   On: 12/24/2015 00:01   Ct Chest W Contrast  12/24/2015  CLINICAL DATA:  Stage IV colon cancer metastatic to lungs and liver. Right rib cage pain. Blood in stools. EXAM: CT CHEST, ABDOMEN, AND PELVIS WITH CONTRAST TECHNIQUE: Multidetector CT imaging of the chest, abdomen and pelvis was performed following the standard protocol during bolus administration of intravenous contrast. CONTRAST:  136m ISOVUE-300 IOPAMIDOL (ISOVUE-300) INJECTION 61% COMPARISON:  09/03/2015 FINDINGS: CT CHEST FINDINGS Mediastinum/Lymph Nodes: There is right pretracheal lymphadenopathy measuring 1.5 cm diameter, increasing since prior study. Subcarinal lymphadenopathy measures 1.4 cm, increasing since prior study. Normal heart size. Normal caliber thoracic aorta. Esophagus is decompressed. Lungs/Pleura: Multiple pulmonary metastases  diffusely throughout both lungs. Largest lesion is in the right lung base posteriorly and measures 5.9 by 4.9 cm, increasing since previous study. No pleural effusions. No pneumothorax. Musculoskeletal: No chest wall mass or suspicious bone lesions identified. CT ABDOMEN  PELVIS FINDINGS Hepatobiliary: Multiple low-attenuation metastases throughout the liver. The lesion previously measured in the dome of the liver today measures 4.8 x 3.6 cm, representing significant increase in size. Previously measured lesion in the inferior right lobe of the liver today measures 3.8 by 2.7 cm, representing significant increase. Previous lesion at the junction of segments 4 and 8 today measures 3.3 x 3.4 cm, representing significant increase in size. The gallbladder is either contracted or surgically absent. No bile duct dilatation. Pancreas: No mass, inflammatory changes, or other significant abnormality. Spleen: Within normal limits in size and appearance. Adrenals/Urinary Tract: No masses identified. No evidence of hydronephrosis. Stomach/Bowel: No evidence of obstruction, inflammatory process, or abnormal fluid collections.There is a circumferential mass in the mid sigmoid colon, more prominent than on prior study. There is regional lymphadenopathy adjacent to the sigmoid colon measuring up to 2.1 cm in diameter, increasing since previous study. Vascular/Lymphatic: No pathologically enlarged lymph nodes. No evidence of abdominal aortic aneurysm. Reproductive: Multiple uterine masses are likely to represent fibroids. No abnormal adnexal masses. Other: None. Musculoskeletal:  No suspicious bone lesions identified. IMPRESSION: Chest: Significant progression of multiple bilateral pulmonary metastases. Mild progression of mediastinal lymphadenopathy. Abdomen:  Significant progression of multiple hepatic metastases. Pelvis: Mass in the sigmoid colon, increasing in prominence since previous study. Increasing regional lymphadenopathy around the sigmoid colon. Electronically Signed   By: Lucienne Capers M.D.   On: 12/24/2015 00:12   Ct Abdomen Pelvis W Contrast  12/24/2015  CLINICAL DATA:  Stage IV colon cancer metastatic to lungs and liver. Right rib cage pain. Blood in stools. EXAM: CT CHEST,  ABDOMEN, AND PELVIS WITH CONTRAST TECHNIQUE: Multidetector CT imaging of the chest, abdomen and pelvis was performed following the standard protocol during bolus administration of intravenous contrast. CONTRAST:  119m ISOVUE-300 IOPAMIDOL (ISOVUE-300) INJECTION 61% COMPARISON:  09/03/2015 FINDINGS: CT CHEST FINDINGS Mediastinum/Lymph Nodes: There is right pretracheal lymphadenopathy measuring 1.5 cm diameter, increasing since prior study. Subcarinal lymphadenopathy measures 1.4 cm, increasing since prior study. Normal heart size. Normal caliber thoracic aorta. Esophagus is decompressed. Lungs/Pleura: Multiple pulmonary metastases diffusely throughout both lungs. Largest lesion is in the right lung base posteriorly and measures 5.9 by 4.9 cm, increasing since previous study. No pleural effusions. No pneumothorax. Musculoskeletal: No chest wall mass or suspicious bone lesions identified. CT ABDOMEN PELVIS FINDINGS Hepatobiliary: Multiple low-attenuation metastases throughout the liver. The lesion previously measured in the dome of the liver today measures 4.8 x 3.6 cm, representing significant increase in size. Previously measured lesion in the inferior right lobe of the liver today measures 3.8 by 2.7 cm, representing significant increase. Previous lesion at the junction of segments 4 and 8 today measures 3.3 x 3.4 cm, representing significant increase in size. The gallbladder is either contracted or surgically absent. No bile duct dilatation. Pancreas: No mass, inflammatory changes, or other significant abnormality. Spleen: Within normal limits in size and appearance. Adrenals/Urinary Tract: No masses identified. No evidence of hydronephrosis. Stomach/Bowel: No evidence of obstruction, inflammatory process, or abnormal fluid collections.There is a circumferential mass in the mid sigmoid colon, more prominent than on prior study. There is regional lymphadenopathy adjacent to the sigmoid colon measuring up to 2.1 cm  in diameter, increasing since previous study. Vascular/Lymphatic: No pathologically enlarged lymph nodes.  No evidence of abdominal aortic aneurysm. Reproductive: Multiple uterine masses are likely to represent fibroids. No abnormal adnexal masses. Other: None. Musculoskeletal:  No suspicious bone lesions identified. IMPRESSION: Chest: Significant progression of multiple bilateral pulmonary metastases. Mild progression of mediastinal lymphadenopathy. Abdomen:  Significant progression of multiple hepatic metastases. Pelvis: Mass in the sigmoid colon, increasing in prominence since previous study. Increasing regional lymphadenopathy around the sigmoid colon. Electronically Signed   By: Lucienne Capers M.D.   On: 12/24/2015 00:12    Assessment: 55 year old very pleasant female with history of stage IV colon cancer, progressive disease with CT this admission noting significant progression of pulmonary and hepatic metastases, along with sigmoid colon mass increasing in prominence since prior study Feb 2017, who presented with rectal bleeding that does not appear to be hemodynamically significant and now resolved after holding Xarelto. Conservative approach recommended. Discussed with Dr. Marin Comment: patient likely going home later today. Advance to low fat diet.    Plan: Low fat diet Continue follow-up with Oncology Hopeful discharge home soon  Orvil Feil, ANP-BC [facility]    LOS: 1 day    12/25/2015, 8:02 AM

## 2015-12-26 LAB — MRSA CULTURE

## 2015-12-28 ENCOUNTER — Encounter (HOSPITAL_COMMUNITY): Payer: Medicaid Other | Attending: Oncology | Admitting: Hematology & Oncology

## 2015-12-28 ENCOUNTER — Encounter (HOSPITAL_COMMUNITY): Payer: Medicaid Other

## 2015-12-28 VITALS — BP 141/86 | HR 91 | Temp 97.6°F | Resp 18 | Wt 243.6 lb

## 2015-12-28 DIAGNOSIS — C787 Secondary malignant neoplasm of liver and intrahepatic bile duct: Secondary | ICD-10-CM | POA: Diagnosis not present

## 2015-12-28 DIAGNOSIS — C7800 Secondary malignant neoplasm of unspecified lung: Secondary | ICD-10-CM | POA: Diagnosis not present

## 2015-12-28 DIAGNOSIS — E876 Hypokalemia: Secondary | ICD-10-CM | POA: Diagnosis not present

## 2015-12-28 DIAGNOSIS — C189 Malignant neoplasm of colon, unspecified: Secondary | ICD-10-CM | POA: Insufficient documentation

## 2015-12-28 DIAGNOSIS — Z7901 Long term (current) use of anticoagulants: Secondary | ICD-10-CM

## 2015-12-28 DIAGNOSIS — I82621 Acute embolism and thrombosis of deep veins of right upper extremity: Secondary | ICD-10-CM

## 2015-12-28 DIAGNOSIS — R918 Other nonspecific abnormal finding of lung field: Secondary | ICD-10-CM

## 2015-12-28 MED ORDER — OXYCODONE-ACETAMINOPHEN 5-325 MG PO TABS: 1.0000 | ORAL_TABLET | ORAL | Status: DC | PRN

## 2015-12-28 NOTE — Patient Instructions (Signed)
[place] at [facility] Discharge Instructions  RECOMMENDATIONS MADE BY THE CONSULTANT AND ANY TEST RESULTS WILL BE SENT TO YOUR REFERRING PHYSICIAN.  Exam done and seen today by Dr. Whitney Muse Return to see the doctor in one month Please call the clinic if you have any questions or concerns  Thank you for choosing [place] at [facility] to provide your oncology and hematology care.  To afford each patient quality time with our provider, please arrive at least 15 minutes before your scheduled appointment time.   Beginning January 23rd 2017 lab work for the [name] will be done in the  Main lab at [facility] on 1st floor. If you have a lab appointment with the [place] please come in thru the  Main Entrance and check in at the main information desk  You need to re-schedule your appointment should you arrive 10 or more minutes late.  We strive to give you quality time with our providers, and arriving late affects you and other patients whose appointments are after yours.  Also, if you no show three or more times for appointments you may be dismissed from the clinic at the providers discretion.     Again, thank you for choosing [facility].  Our hope is that these requests will decrease the amount of time that you wait before being seen by our physicians.       _____________________________________________________________  Should you have questions after your visit to [facility], please contact our office at [phone] between the hours of 8:30 a.m. and 4:30 p.m.  Voicemails left after 4:30 p.m. will not be returned until the following business day.  For prescription refill requests, have your pharmacy contact our office.         Resources For Cancer Patients and their Caregivers ? American Cancer Society: Can assist with transportation, wigs, general needs, runs Look Good Feel Better.         334 339 4587 ? Cancer Care: Provides financial assistance, online support groups, medication/co-pay assistance.  1-800-813-HOPE 409-887-3375) ? [place] Assists [place] cancer patients and their families through emotional , educational and financial support.  878 616 6059 ? [place] DSS Where to apply for food stamps, Medicaid and utility assistance. 330-831-2025 ? [facility]: Transportation to medical appointments. 3053758509 ? Social Security Administration: May apply for disability if have a Stage IV cancer. (709)182-1144 (807)105-0931 ? [employer], Disability and Transit Services: Assists with nutrition, care and transit needs. [place] Support Programs: '@10RELATIVEDAYS'$ @ > Cancer Support Group  2nd Tuesday of the month 1pm-2pm, Journey Room  > Creative Journey  3rd Tuesday of the month 1130am-1pm, Journey Room  > Look Good Feel Better  1st Wednesday of the month 10am-12 noon, Journey Room (Call [place] to register [phone])

## 2015-12-28 NOTE — Progress Notes (Signed)
[place] Progress Note  Patient Care Team: No Pcp Per Patient as PCP - General ([place]) Melrose Nakayama, MD as Consulting Physician (Cardiothoracic Surgery)  CHIEF COMPLAINTS/PURPOSE OF CONSULTATION:  Stage IV CRC CT of abdomen and pelvis on 02/20/2014 at [facility] showing 3.6 cm apple core lesion in the proximal sigmoid colon 11 mm hypoenhancing lesion in the medial left hepatic dome, 4.1 cm right lower lobe mass and adjacent right lower lobe nodule CEA on 08/18/2014 of 93.7 ng/ml Right upper extremity DVT 04/17/2014 paired brachial veins, axillary vein, and peripheral aspect of the right subclavian vein CT chest 07/06/2014 with bilateral pulmonary nodules and masses, RUL, lateral RUL, posterior LUL, lobulated nodule in the subpleural RLL,     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 ([place])   02/20/2014 Imaging CT of abdomen and pelvis on 02/20/2014 at [facility] showing 3.6 cm apple core lesion in the proximal sigmoid colon 11 mm hypoenhancing lesion in the medial left hepatic dome, 4.1 cm right lower lobe mass and adjacent right lower lobe nodule   02/23/2014 Pathology Results KRAS MUTATED   04/17/2014 Imaging Right upper extremity DVT 04/17/2014 paired brachial veins, axillary vein, and peripheral aspect of the right subclavian vein   07/06/2014 Imaging CT chest 07/06/2014 with bilateral pulmonary nodules and masses, RUL, lateral RUL, posterior LUL, lobulated nodule in the subpleural RLL   08/18/2014 Tumor Marker CEA 93.7 ng/ml   10/09/2014 Initial Diagnosis 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   02/19/2015 Imaging Progressive pulmonary metastatic disease as detailed above. Stable mediastinal lymphadenopathy. No abdominal/pelvic metastatic disease is identified   04/24/2015 - 08/07/2015 Chemotherapy XELIRI started on  04/24/2015. Patient did not want to wear 5-FU pump.  On 2000 mg BID 14 days on and 7 days off.   04/28/2015 Adverse Reaction Mild stomatitis.  Xeloda held.  Treated with Magic Mouthwash.  Resolved by 05/02/2015 at office visit.   05/15/2015 Treatment Plan Change Will restart Xeloda at 1500 mg BID 14 days on and 7 days off (11/1)   07/17/2015 Adverse Reaction Mouth sores   07/17/2015 Treatment Plan Change Change in Xeloda frequency: 1500 mg BID 7 days on and 7 days off.   07/24/2015 Adverse Reaction Increased nausea/vomiting   08/07/2015 Treatment Plan Change Irinotecan dose reduced by 10%   08/10/2015 Imaging MRI brain- Scattered punctate subcortical T2 hyperintensities bilaterally are slightly greater than expected for age. The finding is nonspecific but can be seen in the setting of chronic microvascular ischemia, a demyelinating process such as multiple...   09/03/2015 Progression CT CAP- demonstrates new hepatic lesions   09/03/2015 Imaging CT CAP- Multiple new hepatic metastasis in the R hepatic lobe corresponds to subtle hypodensities identified on comparison exam.  Stable metastatic bilateral pulmonary nodules and masses. Small mesenteric lymph nodes along the sigmoid colon are unchanged   09/24/2015 -  Chemotherapy Stivarga 160 mg daily 21/28 days   12/23/2015 - 12/25/2015 Hospital Admission Rectal Bleeding, RUQ pain    HISTORY OF PRESENTING ILLNESS:   Erica Barker 55 y.o. female is here because of stage IV colon cancer. Unfortunately is kras mutated and MSI stable. Erica Barker is on Stivarga. Erica Barker is here today to review recent CT imaging. PS is excellent.   Erica Barker is accompanied by her nephew. I personally reviewed and went over CT imaging results with the patient.  Erica Barker has not experienced  bleeding since Erica Barker was in the hospital. Erica Barker is not taking her Xarelto and has not taken it since Erica Barker was in the ICU.   Erica Barker reports her headaches are still the same, though not quite as often. Erica Barker is sleeping  well without any difference. Her appetite is good. Erica Barker denies any pain at this time.  No nausea or vomiting.   MEDICAL HISTORY:  Past Medical History  Diagnosis Date  . Hypertension   . Depression   . Pulmonary nodules 01/30/14    CTA CHEST  . Lymphadenopathy, mediastinal 01/2014    CTA ANGIO .Marland KitchenHackett  . Morbid obesity ([place])   . Diabetes mellitus, type II ([place])   . Headache(784.0)   . Medically noncompliant 09/06/2015  . Cancer ([place])     stage 4 colon with mets to liver and lung    SURGICAL HISTORY: Past Surgical History  Procedure Laterality Date  . Cholecystectomy    . Video bronchoscopy with endobronchial ultrasound N/A 02/06/2014    Procedure: VIDEO BRONCHOSCOPY WITH ENDOBRONCHIAL ULTRASOUND,bronchial biopsies , node sampling;  Surgeon: Melrose Nakayama, MD;  Location: [place];  Service: Thoracic;  Laterality: N/A;  . Tubal ligation      SOCIAL HISTORY: Social History   Social History  . Marital Status: Widowed    Spouse Name: N/A  . Number of Children: N/A  . Years of Education: N/A   Occupational History  . Not on file.   Social History Main Topics  . Smoking status: Former Smoker -- 0.50 packs/day for 7 years    Types: Cigarettes    Quit date: 02/03/1989  . Smokeless tobacco: Never Used  . Alcohol Use: Yes     Comment: occasional  . Drug Use: No  . Sexual Activity: Not on file   Other Topics Concern  . Not on file   Social History Narrative  Erica Barker is in a relationship. 3 children. Aged 32,35,20.  Erica Barker has no grandchildren. Erica Barker smoked in high school but quit after graduation. No ETOH. 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  Erica Barker is originally from [place].  FAMILY HISTORY: Family History  Problem Relation Age of Onset  . Cancer Mother     UTERINE  . Cancer Father     LEUKEMIA  . Crohn's disease Son   . Colon cancer Neg Hx    indicated that her mother is alive. Erica Barker indicated that her father is  deceased. Erica Barker indicated that her son is alive.   Father is deceased at age 44 from leukemia, mother is alive at 58 with a history of uterine cancer.  Erica Barker has one sister how is healthy and one brother who is healthy. He works as a Engineer, structural.   ALLERGIES:  is allergic to asa.  MEDICATIONS:  Current Outpatient Prescriptions  Medication Sig Dispense Refill  . baclofen (LIORESAL) 10 MG tablet Take 10 mg by mouth 2 (two) times daily as needed for muscle spasms.    Marland Kitchen dexlansoprazole (DEXILANT) 60 MG capsule Take 1 capsule (60 mg total) by mouth daily. 30 capsule 5  . gabapentin (NEURONTIN) 300 MG capsule Take 300 mg by mouth at bedtime.    Marland Kitchen lisinopril (PRINIVIL,ZESTRIL) 10 MG tablet Take 1 tablet (10 mg total) by mouth daily. 30 tablet 6  . metFORMIN (GLUCOPHAGE) 500 MG tablet Take 1 tablet (500 mg total) by mouth daily with breakfast. 30 tablet 3  . Multiple Vitamin (MULTIVITAMIN) capsule Take  1 capsule by mouth daily.    Marland Kitchen oxyCODONE-acetaminophen (PERCOCET/ROXICET) 5-325 MG tablet Take 1 tablet by mouth every 4 (four) hours as needed for severe pain. 60 tablet 0  . potassium chloride SA (K-DUR,KLOR-CON) 20 MEQ tablet Take 2 tablets (40 mEq total) by mouth daily. 60 tablet 0  . sucralfate (CARAFATE) 1 GM/10ML suspension Take 10 mLs (1 g total) by mouth 4 (four) times daily -  with meals and at bedtime. 420 mL 1  . traMADol (ULTRAM) 50 MG tablet Take 1 tablet (50 mg total) by mouth every 6 (six) hours as needed for moderate pain. 30 tablet 0   No current facility-administered medications for this visit.    Review of Systems  Constitutional: Negative for fever, chills, weight loss and malaise/fatigue.  HENT: Positive for headaches  Negative for congestion, hearing loss, nosebleeds, sore throat and tinnitus.   Described as migraines that occur every 3-4 days, alleviated with pain medication.  Eyes: Negative for blurred vision, double vision, pain and discharge.  Respiratory: Negative for  cough, hemoptysis, sputum production, shortness of breath and wheezing.   Cardiovascular: Negative for chest pain, palpitations, claudication, leg swelling and PND.  Gastrointestinal:  Negative for nausea, vomiting, abdominal pain, diarrhea, constipation, heartburn, blood in stool and melena.  Genitourinary: Negative for dysuria, urgency, frequency and hematuria.  Musculoskeletal: Negative for myalgias, joint pain and falls.  Skin:  Positive for feet peeling, mild. Negative for rash and skin changes Neurological: Negative for dizziness, tingling, tremors, sensory change, speech change, focal weakness, seizures, loss of consciousness, weakness. Endo/Heme/Allergies: Does not bruise/bleed easily.  Psychiatric/Behavioral: Negative for depression, suicidal ideas, memory loss and substance abuse. The patient is not nervous/anxious and does not have insomnia.   14 point review of systems was performed and is negative except as detailed under history of present illness and above  PHYSICAL EXAMINATION: ECOG PERFORMANCE STATUS: 1 - Symptomatic but completely ambulatory  Filed Vitals:   12/28/15 0934  BP: 141/86  Pulse: 91  Temp: 97.6 F (36.4 C)  Resp: 18   Filed Weights   12/28/15 0934  Weight: 243 lb 9.6 oz (110.496 kg)    Physical Exam  Constitutional: Erica Barker is oriented to person, place, and time and well-developed, well-nourished, and in no distress.  HENT:  Head: Normocephalic and atraumatic.  Nose: Nose normal.  Mouth/Throat: Oropharynx is clear and moist. No oropharyngeal exudate.  Eyes: Conjunctivae and EOM are normal. Pupils are equal, round, and reactive to light. Right eye exhibits no discharge. Left eye exhibits no discharge. No scleral icterus.  Neck: Normal range of motion. Neck supple. No tracheal deviation present. No thyromegaly present.  Cardiovascular: Normal rate, regular rhythm and normal heart sounds.  Exam reveals no gallop and no friction rub.  No murmur  heard. Pulmonary/Chest: Effort normal and breath sounds normal. Erica Barker has no wheezes. Erica Barker has no rales.  Abdominal: Soft. Bowel sounds are normal. Erica Barker exhibits no distension and no mass. There is no tenderness. There is no rebound and no guarding.  Musculoskeletal: Normal range of motion. Erica Barker exhibits no edema.  Lymphadenopathy:    Erica Barker has no cervical adenopathy.  Neurological: Erica Barker is alert and oriented to person, place, and time. Erica Barker has normal reflexes. No cranial nerve deficit. Gait normal. Coordination normal.  Skin: Skin is warm and dry. No rash noted.Mild peeling of skin on bilateral feet  Psychiatric: Mood, memory, affect and judgment normal.  Nursing note and vitals reviewed.   LABORATORY DATA:  I have reviewed the data  as listed. Results for ARUNA, NESTLER (MRN [id number]) as of 01/26/2016 17:22  Ref. Range 12/24/2015 08:21  WBC Latest Ref Range: 4.0-10.5 K/uL 14.7 (H)  RBC Latest Ref Range: 3.87-5.11 MIL/uL 4.32  Hemoglobin Latest Ref Range: 12.0-15.0 g/dL 06.0  HCT Latest Ref Range: 36.0-46.0 % 38.7  MCV Latest Ref Range: 78.0-100.0 fL 89.6  MCH Latest Ref Range: 26.0-34.0 pg 28.0  MCHC Latest Ref Range: 30.0-36.0 g/dL 67.1  RDW Latest Ref Range: 11.5-15.5 % 15.7 (H)  Platelets Latest Ref Range: 150-400 K/uL 296     RADIOGRAPHIC STUDIES I have personally reviewed the radiological images as listed and agreed with the findings in the report.   Study Result     CLINICAL DATA: History of stage IV metastatic cancer. Right chest pain and blood in stools. Headache.  EXAM: CT HEAD WITHOUT CONTRAST  TECHNIQUE: Contiguous axial images were obtained from the base of the skull through the vertex without intravenous contrast.  COMPARISON: MRI brain 08/10/2015. CT head 02/26/2015.  FINDINGS: Ventricles and sulci appear symmetrical. No ventricular dilatation. No mass effect or midline shift. No abnormal extra-axial fluid collections. Gray-white matter junctions are  distinct. Basal cisterns are not effaced. No evidence of acute intracranial hemorrhage. No depressed skull fractures. Visualized paranasal sinuses and mastoid air cells are not opacified. If there is suspicion for intracranial metastasis, contrast-enhanced MRI would be more sensitive for detection of small metastatic lesions.  IMPRESSION: No acute intracranial abnormalities. No significant mass effect or focal lesion identified.   Electronically Signed  By: Burman Nieves M.D.  On: 12/24/2015 00:01   Study Result     CLINICAL DATA: Stage IV colon cancer metastatic to lungs and liver. Right rib cage pain. Blood in stools.  EXAM: CT CHEST, ABDOMEN, AND PELVIS WITH CONTRAST  TECHNIQUE: Multidetector CT imaging of the chest, abdomen and pelvis was performed following the standard protocol during bolus administration of intravenous contrast.  CONTRAST: ISOVUE-300 IOPAMIDOL (ISOVUE-300) INJECTION 61%  COMPARISON: 09/03/2015  FINDINGS: CT CHEST FINDINGS  Mediastinum/Lymph Nodes: There is right pretracheal lymphadenopathy measuring 1.5 cm diameter, increasing since prior study. Subcarinal lymphadenopathy measures 1.4 cm, increasing since prior study. Normal heart size. Normal caliber thoracic aorta. Esophagus is decompressed.  Lungs/Pleura: Multiple pulmonary metastases diffusely throughout both lungs. Largest lesion is in the right lung base posteriorly and measures 5.9 by 4.9 cm, increasing since previous study. No pleural effusions. No pneumothorax.  Musculoskeletal: No chest wall mass or suspicious bone lesions identified.  CT ABDOMEN PELVIS FINDINGS  Hepatobiliary: Multiple low-attenuation metastases throughout the liver. The lesion previously measured in the dome of the liver today measures 4.8 x 3.6 cm, representing significant increase in size. Previously measured lesion in the inferior right lobe of the liver today measures 3.8 by  2.7 cm, representing significant increase. Previous lesion at the junction of segments 4 and 8 today measures 3.3 x 3.4 cm, representing significant increase in size.  The gallbladder is either contracted or surgically absent. No bile duct dilatation.  Pancreas: No mass, inflammatory changes, or other significant abnormality.  Spleen: Within normal limits in size and appearance.  Adrenals/Urinary Tract: No masses identified. No evidence of hydronephrosis.  Stomach/Bowel: No evidence of obstruction, inflammatory process, or abnormal fluid collections.There is a circumferential mass in the mid sigmoid colon, more prominent than on prior study.  There is regional lymphadenopathy adjacent to the sigmoid colon measuring up to 2.1 cm in diameter, increasing since previous study.  Vascular/Lymphatic: No pathologically enlarged lymph nodes. No evidence of abdominal  aortic aneurysm.  Reproductive: Multiple uterine masses are likely to represent fibroids. No abnormal adnexal masses.  Other: None.  Musculoskeletal: No suspicious bone lesions identified.  IMPRESSION: Chest: Significant progression of multiple bilateral pulmonary metastases. Mild progression of mediastinal lymphadenopathy.  Abdomen: Significant progression of multiple hepatic metastases.  Pelvis: Mass in the sigmoid colon, increasing in prominence since previous study. Increasing regional lymphadenopathy around the sigmoid colon.   Electronically Signed  By: Lucienne Capers M.D.  On: 12/24/2015 00:12    ASSESSMENT & PLAN:  Stage IV colorectal cancer with pulmonary and liver metastases Severe nausea and vomiting after FOLFOX therapy Right upper extremity DVT on XARELTO Headaches Progression on FOLFOX therapy XELIRI/AVASTIN Kras mutation, Nras WT MSI Stable Hypokalemia Rectal Bleed  Pleasant 55 year old female with stage IV colorectal cancer.  At times the patient seems to be very  accepting of her disease and prognosis and other times Erica Barker is very difficult time addressing it. Erica Barker currently has very good quality of life and is very active.   Erica Barker has been on Stivarga with excellent tolerance. Unfortunately CT imaging has shown progression of disease, CEA is concordant. This was all reviewed with the patient in detail.  Erica Barker has agreed to consultation at [facility] for clinical trial consideration.   I have refilled her oxycodone.  Erica Barker has an appointment at [facility] with Dr. Pia Mau next Monday, 6/19. Erica Barker will keep in touch with [place] about future plans and contact us about a follow up schedule.   We will tentatively schedule her for follow up in 1 month.  All questions were answered. The patient knows to call the clinic with any problems, questions or concerns.    This document serves as a record of services personally performed by Ancil Linsey, MD. It was created on her behalf by Arlyce Harman, a trained medical scribe. The creation of this record is based on the scribe's personal observations and the provider's statements to them. This document has been checked and approved by the attending provider.  I have reviewed the above documentation for accuracy and completeness, and I agree with the above.  Kelby Fam. Whitney Muse, MD

## 2016-01-03 ENCOUNTER — Encounter (HOSPITAL_COMMUNITY): Payer: Self-pay

## 2016-01-04 ENCOUNTER — Encounter (HOSPITAL_COMMUNITY): Payer: Self-pay

## 2016-01-04 ENCOUNTER — Encounter (HOSPITAL_COMMUNITY)

## 2016-01-05 ENCOUNTER — Emergency Department (HOSPITAL_COMMUNITY)
Admission: EM | Admit: 2016-01-05 | Discharge: 2016-01-06 | Disposition: A | Discharge status: Home / Self Care | Attending: Emergency Medicine | Admitting: Emergency Medicine

## 2016-01-05 ENCOUNTER — Encounter (HOSPITAL_COMMUNITY): Payer: Self-pay

## 2016-01-05 DIAGNOSIS — C787 Secondary malignant neoplasm of liver and intrahepatic bile duct: Secondary | ICD-10-CM | POA: Insufficient documentation

## 2016-01-05 DIAGNOSIS — E119 Type 2 diabetes mellitus without complications: Secondary | ICD-10-CM | POA: Insufficient documentation

## 2016-01-05 DIAGNOSIS — Z87891 Personal history of nicotine dependence: Secondary | ICD-10-CM | POA: Insufficient documentation

## 2016-01-05 DIAGNOSIS — F329 Major depressive disorder, single episode, unspecified: Secondary | ICD-10-CM | POA: Diagnosis not present

## 2016-01-05 DIAGNOSIS — C189 Malignant neoplasm of colon, unspecified: Secondary | ICD-10-CM | POA: Diagnosis not present

## 2016-01-05 DIAGNOSIS — I10 Essential (primary) hypertension: Secondary | ICD-10-CM | POA: Diagnosis not present

## 2016-01-05 DIAGNOSIS — C7800 Secondary malignant neoplasm of unspecified lung: Secondary | ICD-10-CM | POA: Insufficient documentation

## 2016-01-05 DIAGNOSIS — Z7984 Long term (current) use of oral hypoglycemic drugs: Secondary | ICD-10-CM | POA: Insufficient documentation

## 2016-01-05 DIAGNOSIS — Z6841 Body Mass Index (BMI) 40.0 and over, adult: Secondary | ICD-10-CM | POA: Insufficient documentation

## 2016-01-05 DIAGNOSIS — R1011 Right upper quadrant pain: Secondary | ICD-10-CM

## 2016-01-05 DIAGNOSIS — Z79899 Other long term (current) drug therapy: Secondary | ICD-10-CM | POA: Diagnosis not present

## 2016-01-05 NOTE — ED Notes (Signed)
Right side upper abdominal pain.  I was here around 2 weeks ago for the same kind of pain but I am not bleeding now.  They stopped my xarelto.

## 2016-01-06 LAB — URINE MICROSCOPIC-ADD ON

## 2016-01-06 LAB — COMPREHENSIVE METABOLIC PANEL
ALT: 59 U/L — ABNORMAL HIGH (ref 14–54)
ANION GAP: 9 (ref 5–15)
AST: 55 U/L — ABNORMAL HIGH (ref 15–41)
Albumin: 3.6 g/dL (ref 3.5–5.0)
Alkaline Phosphatase: 130 U/L — ABNORMAL HIGH (ref 38–126)
BILIRUBIN TOTAL: 0.5 mg/dL (ref 0.3–1.2)
BUN: 9 mg/dL (ref 6–20)
CHLORIDE: 102 mmol/L (ref 101–111)
CO2: 23 mmol/L (ref 22–32)
Calcium: 9.1 mg/dL (ref 8.9–10.3)
Creatinine, Ser: 0.52 mg/dL (ref 0.44–1.00)
GFR calc Af Amer: >60 mL/min (ref >60)
GLUCOSE: 238 mg/dL — AB (ref 65–99)
POTASSIUM: 3.5 mmol/L (ref 3.5–5.1)
SODIUM: 134 mmol/L — AB (ref 135–145)
TOTAL PROTEIN: 8 g/dL (ref 6.5–8.1)

## 2016-01-06 LAB — CBC
HEMATOCRIT: 38.3 % (ref 36.0–46.0)
HEMOGLOBIN: 12.4 g/dL (ref 12.0–15.0)
MCH: 27.7 pg (ref 26.0–34.0)
MCHC: 32.4 g/dL (ref 30.0–36.0)
MCV: 85.7 fL (ref 78.0–100.0)
Platelets: 313 10*3/uL (ref 150–400)
RBC: 4.47 MIL/uL (ref 3.87–5.11)
RDW: 15.8 % — ABNORMAL HIGH (ref 11.5–15.5)
WBC: 16.4 10*3/uL — AB (ref 4.0–10.5)

## 2016-01-06 LAB — URINALYSIS, ROUTINE W REFLEX MICROSCOPIC
Bilirubin Urine: NEGATIVE
GLUCOSE, UA: 500 mg/dL — AB
LEUKOCYTES UA: NEGATIVE
NITRITE: NEGATIVE
PH: 5.5 (ref 5.0–8.0)
Protein, ur: 100 mg/dL — AB
SPECIFIC GRAVITY, URINE: 1.025 (ref 1.005–1.030)

## 2016-01-06 LAB — LIPASE, BLOOD: Lipase: 25 U/L (ref 11–51)

## 2016-01-06 MED ORDER — OXYCODONE HCL 5 MG PO TABS
5.0000 mg | ORAL_TABLET | Freq: Once | ORAL | Status: AC
  Administered 2016-01-06: 5 mg via ORAL
  Filled 2016-01-06: qty 1

## 2016-01-06 MED ORDER — HEPARIN SOD (PORK) LOCK FLUSH 100 UNIT/ML IV SOLN
INTRAVENOUS | Status: AC
  Administered 2016-01-06: 500 [IU]
  Filled 2016-01-06: qty 5

## 2016-01-06 NOTE — Discharge Instructions (Signed)
Take your pain medication if needed. Follow-up with [facility] to continue your treatment of your cancer. Return to the emergency department if your pain gets constant, severe, you get nausea or vomiting, or you get dizzy or lightheaded or you seem worse in any way.

## 2016-01-06 NOTE — ED Notes (Signed)
Dr Knapp in to assess 

## 2016-01-06 NOTE — ED Provider Notes (Addendum)
CSN: [id number]     Arrival date & time 01/05/16  2246 History  By signing my name below, I, Altamease Oiler, attest that this documentation has been prepared under the direction and in the presence of Rolland Porter, MD at 00:26 AM. Electronically Signed: Altamease Oiler, ED Scribe. 01/06/2016. 12:35 AM    Chief Complaint  Patient presents with  . Abdominal Pain    The history is provided by the patient. No language interpreter was used.   Erica Barker is a 55 y.o. female with PMHx of stage IV colon cancer with mets to the liver and lungs, DM, and HTN who presents to the Emergency Department complaining of recurrent, intermittent, sharp, 7/10 in severity,  right upper abdominal pain with onset yesterday morning. Pt states that she was seen for similar pain June 11th that was accompanied by blood in her stool. She was admitted to the hospital for 5 days and was in the ICU. They stopped her xarelto that she was on for DVT in her right arm in 2015 She has had no hematochezia with this episode of pain and is no longer taking Xarelto. The pain is elicited by breathing, coughing, and certain movements. She has no pain when she is still.  Pt took nothing for pain at home. She states that she has pain medication at home but was not sure if she should take it. She states the pain is the same pain she had when she was admitted with GI bleeding. It is not worse. Associated symptoms include nausea. Pt denies vomiting, dizziness, and lightheadedness. Her oncology care is at [facility]. She states they are going to make her a case study and try an experimental medication on her.  PCP none Oncology Dr Whitney Muse and [facility]  Past Medical History  Diagnosis Date  . Hypertension   . Depression   . Pulmonary nodules 01/30/14    CTA CHEST  . Lymphadenopathy, mediastinal 01/2014    CTA ANGIO [place]  . Morbid obesity ([place])   . Diabetes mellitus, type II ([place])   . Headache(784.0)   . Medically noncompliant  09/06/2015  . Cancer ([place])     stage 4 colon with mets to liver and lung   Past Surgical History  Procedure Laterality Date  . Cholecystectomy    . Video bronchoscopy with endobronchial ultrasound N/A 02/06/2014    Procedure: VIDEO BRONCHOSCOPY WITH ENDOBRONCHIAL ULTRASOUND,bronchial biopsies , node sampling;  Surgeon: Melrose Nakayama, MD;  Location: [facility] OR;  Service: Thoracic;  Laterality: N/A;  . Tubal ligation     Family History  Problem Relation Age of Onset  . Cancer Mother     UTERINE  . Cancer Father     LEUKEMIA  . Crohn's disease Son   . Colon cancer Neg Hx    Social History  Substance Use Topics  . Smoking status: Former Smoker -- 0.50 packs/day for 7 years    Types: Cigarettes    Quit date: 02/03/1989  . Smokeless tobacco: Never Used  . 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 Negative for vomiting.  Neurological: Negative for dizziness and light-headedness.  All other systems reviewed and are negative.  Allergies  Asa  Home Medications   Prior to Admission medications   Medication Sig Start  Date End Date Taking? Authorizing Provider  baclofen (LIORESAL) 10 MG tablet Take 10 mg by mouth 2 (two) times daily as needed for muscle spasms.    Historical Provider, MD  dexlansoprazole (DEXILANT) 60 MG capsule Take 1 capsule (60 mg total) by mouth daily. 10/01/15   Baird Cancer, PA-C  gabapentin (NEURONTIN) 300 MG capsule Take 300 mg by mouth at bedtime.    Historical Provider, MD  lisinopril (PRINIVIL,ZESTRIL) 10 MG tablet Take 1 tablet (10 mg total) by mouth daily. 10/20/14   Patrici Ranks, MD  metFORMIN (GLUCOPHAGE) 500 MG tablet Take 1 tablet (500 mg total) by mouth daily with breakfast. 07/17/15   Patrici Ranks, MD  Multiple Vitamin (MULTIVITAMIN) capsule Take 1 capsule by mouth  daily.    Historical Provider, MD  oxyCODONE-acetaminophen (PERCOCET/ROXICET) 5-325 MG tablet Take 1 tablet by mouth every 4 (four) hours as needed for severe pain. 12/28/15   Patrici Ranks, MD  potassium chloride SA (K-DUR,KLOR-CON) 20 MEQ tablet Take 2 tablets (40 mEq total) by mouth daily. 11/23/15   Baird Cancer, PA-C  sucralfate (CARAFATE) 1 GM/10ML suspension Take 10 mLs (1 g total) by mouth 4 (four) times daily -  with meals and at bedtime. 11/09/15   Patrici Ranks, MD   BP 131/107 mmHg  Pulse 91  Temp(Src) 99 F (37.2 C) (Oral)  Resp 16  Ht '5\' 4"'$  (1.626 m)  Wt 240 lb (108.863 kg)  BMI 41.18 kg/m2  SpO2 100%  LMP 10/12/2013  Vital signs normal   Physical Exam  Constitutional: She is oriented to person, place, and time. She appears well-developed and well-nourished.  Non-toxic appearance. She does not appear ill. No distress.  HENT:  Head: Normocephalic and atraumatic.  Right Ear: External ear normal.  Left Ear: External ear normal.  Nose: Nose normal. No mucosal edema or rhinorrhea.  Mouth/Throat: Oropharynx is clear and moist and mucous membranes are normal. No dental abscesses or uvula swelling.  Eyes: Conjunctivae and EOM are normal. Pupils are equal, round, and reactive to light.  Neck: Normal range of motion and full passive range of motion without pain. Neck supple.  Cardiovascular: Normal rate, regular rhythm and normal heart sounds.  Exam reveals no gallop and no friction rub.   No murmur heard. Pulmonary/Chest: Effort normal and breath sounds normal. No respiratory distress. She has no wheezes. She has no rhonchi. She has no rales. She exhibits no tenderness and no crepitus.  Abdominal: Soft. Normal appearance and bowel sounds are normal. She exhibits no distension. There is tenderness. There is no rebound and no guarding.  Mild RUQ tenderness  Musculoskeletal: Normal range of motion. She exhibits no edema or tenderness.  Moves all extremities well.    Neurological: She is alert and oriented to person, place, and time. She has normal strength. No cranial nerve deficit.  Skin: Skin is warm, dry and intact. No rash noted. No erythema. No pallor.  Psychiatric: She has a normal mood and affect. Her speech is normal and behavior is normal. Her mood appears not anxious.  Nursing note and vitals reviewed.   ED Course  Procedures (including critical care time)  Medications  oxyCODONE (Oxy IR/ROXICODONE) immediate release tablet 5 mg (5 mg Oral Given 01/06/16 0038)    DIAGNOSTIC STUDIES: Oxygen Saturation is 100% on RA,  normal by my interpretation.    COORDINATION OF CARE: 12:33 AM Discussed treatment plan which includes lab work and pain medication with pt at bedside and pt  agreed to plan. Patient was given the option of getting pain medication IV or by mouth and she elected to take something by mouth.  Review of the [place] shows patient was prescribed #60 oxycodone 5/325 on June 20 by Dr. Whitney Muse.  I reviewed patient's last CT scan of her abdomen pelvis which was obtained in her last ER visit on June 12 and she is noted to have a mass in the dome of her liver.  Recheck at 1:50 AM patient states her pain is much improved. She is now able to change positions and breathe deeply. We discussed her test results. She is waiting for an appointment at [facility]. We discussed things to return to the emergency room such as constant pain, severe pain, dizziness or lightheadedness, nausea or vomiting.  Labs Review Results for orders placed or performed during the hospital encounter of 01/05/16  Lipase, blood  Result Value Ref Range   Lipase 25 11 - 51 U/L  Comprehensive metabolic panel  Result Value Ref Range   Sodium 134 (L) 135 - 145 mmol/L   Potassium 3.5 3.5 - 5.1 mmol/L   Chloride 102 101 - 111 mmol/L   CO2 23 22 - 32 mmol/L   Glucose, Bld 238 (H) 65 - 99 mg/dL   BUN 9 6 - 20 mg/dL   Creatinine, Ser 0.52 0.44 - 1.00 mg/dL    Calcium 9.1 8.9 - 10.3 mg/dL   Total Protein 8.0 6.5 - 8.1 g/dL   Albumin 3.6 3.5 - 5.0 g/dL   AST 55 (H) 15 - 41 U/L   ALT 59 (H) 14 - 54 U/L   Alkaline Phosphatase 130 (H) 38 - 126 U/L   Total Bilirubin 0.5 0.3 - 1.2 mg/dL   GFR calc non Af Amer >60 >60 mL/min   GFR calc Af Amer >60 >60 mL/min   Anion gap 9 5 - 15  CBC  Result Value Ref Range   WBC 16.4 (H) 4.0 - 10.5 K/uL   RBC 4.47 3.87 - 5.11 MIL/uL   Hemoglobin 12.4 12.0 - 15.0 g/dL   HCT 38.3 36.0 - 46.0 %   MCV 85.7 78.0 - 100.0 fL   MCH 27.7 26.0 - 34.0 pg   MCHC 32.4 30.0 - 36.0 g/dL   RDW 15.8 (H) 11.5 - 15.5 %   Platelets 313 150 - 400 K/uL  Urinalysis, Routine w reflex microscopic  Result Value Ref Range   Color, Urine YELLOW YELLOW   APPearance CLEAR CLEAR   Specific Gravity, Urine 1.025 1.005 - 1.030   pH 5.5 5.0 - 8.0   Glucose, UA 500 (A) NEGATIVE mg/dL   Hgb urine dipstick MODERATE (A) NEGATIVE   Bilirubin Urine NEGATIVE NEGATIVE   Ketones, ur TRACE (A) NEGATIVE mg/dL   Protein, ur 100 (A) NEGATIVE mg/dL   Nitrite NEGATIVE NEGATIVE   Leukocytes, UA NEGATIVE NEGATIVE  Urine microscopic-add on  Result Value Ref Range   Squamous Epithelial / LPF 6-30 (A) NONE SEEN   WBC, UA 6-30 0 - 5 WBC/hpf   RBC / HPF 0-5 0 - 5 RBC/hpf   Bacteria, UA FEW (A) NONE SEEN   Laboratory interpretation all normal except contaminated UA, New mild elevation of LFTs   Imaging Review No results found.   Ct Head Wo Contrast  12/24/2015  CLINICAL DATA:  History of stage IV metastatic cancer. Right chest pain and blood in stools. Headache. EXAM: CT HEAD WITHOUT CONTRAST TECHNIQUE: Contiguous axial images were obtained from the  base of the skull through the vertex without intravenous contrast. COMPARISON:  MRI brain 08/10/2015.  CT head 02/26/2015. FINDINGS: Ventricles and sulci appear symmetrical. No ventricular dilatation. No mass effect or midline shift. No abnormal extra-axial fluid collections. Gray-white matter junctions are  distinct. Basal cisterns are not effaced. No evidence of acute intracranial hemorrhage. No depressed skull fractures. Visualized paranasal sinuses and mastoid air cells are not opacified. If there is suspicion for intracranial metastasis, contrast-enhanced MRI would be more sensitive for detection of small metastatic lesions. IMPRESSION: No acute intracranial abnormalities. No significant mass effect or focal lesion identified. Electronically Signed   By: Lucienne Capers M.D.   On: 12/24/2015 00:01   Ct Chest W Contrast  Ct Abdomen Pelvis W Contrast  12/24/2015  CLINICAL DATA:  Stage IV colon cancer metastatic to lungs and liver. Right rib cage pain. Blood in stools. EXAM: CT CHEST, ABDOMEN, AND PELVIS WITH CONTRAST TECHNIQUE: Multidetector CT imaging of the chest, abdomen and pelvis was performed following the standard protocol during bolus administration of intravenous contrast. CONTRAST:  153m ISOVUE-300 IOPAMIDOL (ISOVUE-300) INJECTION 61% COMPARISON:  09/03/2015 FINDINGS: CT CHEST FINDINGS Mediastinum/Lymph Nodes: There is right pretracheal lymphadenopathy measuring 1.5 cm diameter, increasing since prior study. Subcarinal lymphadenopathy measures 1.4 cm, increasing since prior study. Normal heart size. Normal caliber thoracic aorta. Esophagus is decompressed. Lungs/Pleura: Multiple pulmonary metastases diffusely throughout both lungs. Largest lesion is in the right lung base posteriorly and measures 5.9 by 4.9 cm, increasing since previous study. No pleural effusions. No pneumothorax. Musculoskeletal: No chest wall mass or suspicious bone lesions identified. CT ABDOMEN PELVIS FINDINGS Hepatobiliary: Multiple low-attenuation metastases throughout the liver. The lesion previously measured in the dome of the liver today measures 4.8 x 3.6 cm, representing significant increase in size. Previously measured lesion in the inferior right lobe of the liver today measures 3.8 by 2.7 cm, representing significant  increase. Previous lesion at the junction of segments 4 and 8 today measures 3.3 x 3.4 cm, representing significant increase in size. The gallbladder is either contracted or surgically absent. No bile duct dilatation. Pancreas: No mass, inflammatory changes, or other significant abnormality. Spleen: Within normal limits in size and appearance. Adrenals/Urinary Tract: No masses identified. No evidence of hydronephrosis. Stomach/Bowel: No evidence of obstruction, inflammatory process, or abnormal fluid collections.There is a circumferential mass in the mid sigmoid colon, more prominent than on prior study. There is regional lymphadenopathy adjacent to the sigmoid colon measuring up to 2.1 cm in diameter, increasing since previous study. Vascular/Lymphatic: No pathologically enlarged lymph nodes. No evidence of abdominal aortic aneurysm. Reproductive: Multiple uterine masses are likely to represent fibroids. No abnormal adnexal masses. Other: None. Musculoskeletal:  No suspicious bone lesions identified. IMPRESSION: Chest: Significant progression of multiple bilateral pulmonary metastases. Mild progression of mediastinal lymphadenopathy. Abdomen:  Significant progression of multiple hepatic metastases. Pelvis: Mass in the sigmoid colon, increasing in prominence since previous study. Increasing regional lymphadenopathy around the sigmoid colon. Electronically Signed   By: WLucienne CapersM.D.   On: 12/24/2015 00:12    I have personally reviewed and evaluated these lab results as part of my medical decision-making.    MDM   Final diagnoses:  RUQ abdominal pain  Liver metastases ([place]   Plan discharge  IRolland Porter MD, FACEP   I personally performed the services described in this documentation, which was scribed in my presence. The recorded information has been reviewed and considered.  IRolland Porter MD, FBarbette Or MD 01/06/16 0Greggory Keen IDaleen Bo  Tomi Bamberger, MD 01/06/16 [phone]

## 2016-01-07 ENCOUNTER — Other Ambulatory Visit (HOSPITAL_COMMUNITY): Payer: Self-pay

## 2016-01-07 ENCOUNTER — Telehealth (HOSPITAL_COMMUNITY): Payer: Self-pay

## 2016-01-07 DIAGNOSIS — C189 Malignant neoplasm of colon, unspecified: Secondary | ICD-10-CM

## 2016-01-07 DIAGNOSIS — C787 Secondary malignant neoplasm of liver and intrahepatic bile duct: Principal | ICD-10-CM

## 2016-01-07 MED ORDER — TRAMADOL HCL 50 MG PO TABS: 50.0000 mg | ORAL_TABLET | Freq: Four times a day (QID) | ORAL | Status: DC | PRN

## 2016-01-07 NOTE — Telephone Encounter (Signed)
Patient called back reporting pain was better but felt like a "Zombie" when she took her percocet. Patient requesting different pain med.   Kirby Crigler notified and will call in Ultram 50 mg to [place]. Patient aware of this.

## 2016-01-07 NOTE — Telephone Encounter (Signed)
-----   Message from Epifanio Lesches sent at 01/07/2016  9:29 AM EDT ----- She was seen in the ED for pain in her side and would like to discuss what she was told and to let us know that she is still on pain

## 2016-01-07 NOTE — Telephone Encounter (Signed)
Patient just called, I returned phone call, patient is in tremendous pain, hurts when she takes a deep breath. I advised her to take a pain pill since she had not done so. Will discuss with Kirby Crigler PA-C to get further advice. Spoke with Kirby Crigler PA-C at [phone], He advised her to do the same as mentioned above, after an hour to repeat dose of pain med, to call us this afternoon and let us know how she is feeling. Will Advise more at that time. Patient called and made aware of the advice by PA.

## 2016-01-08 ENCOUNTER — Other Ambulatory Visit (HOSPITAL_COMMUNITY): Payer: Self-pay | Admitting: *Deleted

## 2016-01-08 DIAGNOSIS — C787 Secondary malignant neoplasm of liver and intrahepatic bile duct: Principal | ICD-10-CM

## 2016-01-08 DIAGNOSIS — C189 Malignant neoplasm of colon, unspecified: Secondary | ICD-10-CM

## 2016-01-08 MED ORDER — TRAMADOL HCL 50 MG PO TABS
50.0000 mg | ORAL_TABLET | Freq: Four times a day (QID) | ORAL | Status: DC | PRN
Start: 2016-01-08 — End: 2016-04-24

## 2016-01-26 ENCOUNTER — Encounter (HOSPITAL_COMMUNITY): Payer: Self-pay | Admitting: Hematology & Oncology

## 2016-01-28 ENCOUNTER — Other Ambulatory Visit (HOSPITAL_COMMUNITY): Payer: Self-pay | Admitting: Oncology

## 2016-01-29 ENCOUNTER — Encounter: Payer: Self-pay | Admitting: *Deleted

## 2016-01-29 ENCOUNTER — Encounter (HOSPITAL_COMMUNITY): Attending: Oncology | Admitting: Oncology

## 2016-01-29 ENCOUNTER — Encounter (HOSPITAL_COMMUNITY): Payer: Self-pay | Admitting: Oncology

## 2016-01-29 VITALS — BP 136/91 | HR 96 | Temp 97.8°F | Resp 16 | Wt 240.8 lb

## 2016-01-29 DIAGNOSIS — C189 Malignant neoplasm of colon, unspecified: Secondary | ICD-10-CM | POA: Diagnosis present

## 2016-01-29 DIAGNOSIS — C787 Secondary malignant neoplasm of liver and intrahepatic bile duct: Secondary | ICD-10-CM

## 2016-01-29 DIAGNOSIS — C7800 Secondary malignant neoplasm of unspecified lung: Secondary | ICD-10-CM | POA: Diagnosis not present

## 2016-01-29 MED ORDER — LORAZEPAM 0.5 MG PO TABS
0.5000 mg | ORAL_TABLET | Freq: Four times a day (QID) | ORAL | Status: AC | PRN
Start: 2016-01-29 — End: ?

## 2016-01-29 NOTE — Assessment & Plan Note (Addendum)
Stage IV colon cancer, KRAS mutated, with imaging on 12/24/2015 showing progression of disease resulting in a referral to [facility] for consideration of clinical trial.  She has seen Dr. Teryl Lucy at [facility] on 12/31/2015.  Oncology history is updated.  Having not heard from Dr. Teryl Lucy regarding the patient's status with enrollment in clinical trial, I sent him an email on 01/28/2016 requesting an update. This morning, 01/29/2016, Dr. Pia Mau requested I ascertain FoundationOne testing to help guide enrollment into clinical trials.  I have called [facility] Pathology requesting FoundationOne be sent off on past specimen. I received a return telephone call from [facility]) reporting concern about the amount of tissue available for next generation testing.  I'm concerned about insufficient material for testing. As a result, I have sent a message to interventional radiology, Dr. Jacqulynn Cadet, requesting his recommendations regarding biopsy to ascertain tissue for molecular testing.    Labs today CBC diff, CMET.  I personally reviewed and went over laboratory results with the patient.  The results are noted within this dictation.  I will keep Dr. Teryl Lucy at [facility] apprised of the patient's status and as a matter of fact, I have updated him via secure e-mail.  She notes intermittent nausea.  She is not taking any antiemetics because "they don't work."  She is agreeable to have medication on hand.  I have prescribed PO Ativan 0.5 mg every 4 hours PRN nausea/vomiting.  Return in 3-4 weeks for follow-up.  This appointment can be moved according to Dr. Romero Liner recommendations.  Addendum: Dr. Geroge Baseman has reviewed the scans and he will try to expedite this patient's biopsy to send for FoundationOne testing.  I will ask nursing to call pathology to cancel sending pathology off for FoundationOne testing on old specimen and we will send off tissue on new specimen.  Order is placed for US  guided biopsy.  I have called the patient with this updated information and she is agreeable to pursue re-biopsy.  Additionally, she notes significant response to Ativan for nausea as mentioned above.

## 2016-01-29 NOTE — Patient Instructions (Signed)
[place] at [facility] Discharge Instructions  RECOMMENDATIONS MADE BY THE CONSULTANT AND ANY TEST RESULTS WILL BE SENT TO YOUR REFERRING PHYSICIAN.  You saw Dr. Gershon Mussel today. Follow up in 3 weeks. Gershon Mussel will send a sample or your cancer for Foundation One testing.  Thank you for choosing [place] at [facility] to provide your oncology and hematology care.  To afford each patient quality time with our provider, please arrive at least 15 minutes before your scheduled appointment time.   Beginning January 23rd 2017 lab work for the [name] will be done in the  Main lab at [facility] on 1st floor. If you have a lab appointment with the [place] please come in thru the  Main Entrance and check in at the main information desk  You need to re-schedule your appointment should you arrive 10 or more minutes late.  We strive to give you quality time with our providers, and arriving late affects you and other patients whose appointments are after yours.  Also, if you no show three or more times for appointments you may be dismissed from the clinic at the providers discretion.     Again, thank you for choosing [facility].  Our hope is that these requests will decrease the amount of time that you wait before being seen by our physicians.       _____________________________________________________________  Should you have questions after your visit to [facility], please contact our office at [phone] between the hours of 8:30 a.m. and 4:30 p.m.  Voicemails left after 4:30 p.m. will not be returned until the following business day.  For prescription refill requests, have your pharmacy contact our office.         Resources For Cancer Patients and their Caregivers ? American Cancer Society: Can assist with transportation, wigs, general needs, runs Look Good Feel Better.        267-351-4932 ? Cancer  Care: Provides financial assistance, online support groups, medication/co-pay assistance.  1-800-813-HOPE 815-427-4196) ? [place] Assists [place] cancer patients and their families through emotional , educational and financial support.  (812) 622-8225 ? [place] DSS Where to apply for food stamps, Medicaid and utility assistance. (989)437-8077 ? [facility]: Transportation to medical appointments. (647)538-5833 ? Social Security Administration: May apply for disability if have a Stage IV cancer. (519) 100-1984 585-597-2592 ? [employer], Disability and Transit Services: Assists with nutrition, care and transit needs. [place] Support Programs: '@10RELATIVEDAYS'$ @ > Cancer Support Group  2nd Tuesday of the month 1pm-2pm, Journey Room  > Creative Journey  3rd Tuesday of the month 1130am-1pm, Journey Room  > Look Good Feel Better  1st Wednesday of the month 10am-12 noon, Journey Room (Call [facility] to register [phone])

## 2016-01-29 NOTE — Progress Notes (Signed)
[place] Clinical Social Work  Clinical Social Work was referred by need for check in and follow up. CSW met briefly for re-assessment of needs. Pt adjusting to illness appropriately and continues to be active in support groups for additional support. Clinical Social Worker will continue to follow and offer support to pt accordingly.     Clinical Social Work interventions: Supportive check in  [place], [place] Tuesdays   [phone]

## 2016-01-29 NOTE — Progress Notes (Signed)
No PCP Per Patient No address on file  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 ([place]) - Plan: LORazepam (ATIVAN) 0.5 MG tablet, US Biopsy  CURRENT THERAPY: Waiting to hear about clinical trial enrollment.  INTERVAL HISTORY: Erica Barker 55 y.o. female returns for followup of Stage IV colon cancer, KRAS mutated, with imaging on 12/24/2015 showing progression of disease resulting in a referral to [facility] for consideration of clinical trial.    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 ([place])   02/20/2014 Imaging CT of abdomen and pelvis on 02/20/2014 at [facility] showing 3.6 cm apple core lesion in the proximal sigmoid colon 11 mm hypoenhancing lesion in the medial left hepatic dome, 4.1 cm right lower lobe mass and adjacent right lower lobe nodule   02/23/2014 Pathology Results KRAS MUTATED   04/17/2014 Imaging Right upper extremity DVT 04/17/2014 paired brachial veins, axillary vein, and peripheral aspect of the right subclavian vein   07/06/2014 Imaging CT chest 07/06/2014 with bilateral pulmonary nodules and masses, RUL, lateral RUL, posterior LUL, lobulated nodule in the subpleural RLL   08/18/2014 Tumor Marker CEA 93.7 ng/ml   10/09/2014 Initial Diagnosis 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   10/11/2014 - 01/29/2015 Chemotherapy FOLFOX + Avastin beginning at [facility]   01/29/2015 Imaging Port study- Unremarkable Port-A-Cath injection.   02/19/2015 Imaging Progressive pulmonary metastatic disease as detailed above. Stable mediastinal lymphadenopathy. No abdominal/pelvic metastatic disease is identified   04/24/2015 - 08/07/2015 Chemotherapy XELIRI started on 04/24/2015. Patient did not want to wear 5-FU pump.  On 2000 mg BID 14 days on and 7 days off.   04/28/2015 Adverse Reaction Mild stomatitis.  Xeloda held.  Treated with Magic Mouthwash.  Resolved by 05/02/2015 at office visit.   05/15/2015 Treatment Plan Change Will restart Xeloda at 1500 mg BID 14 days on and 7 days off (11/1)   07/17/2015 Adverse Reaction Mouth sores   07/17/2015 Treatment Plan Change Change in Xeloda frequency: 1500 mg BID 7 days on and 7 days off.   07/24/2015 Adverse Reaction Increased nausea/vomiting   08/07/2015 Treatment Plan Change Irinotecan dose reduced by 10%   08/10/2015 Imaging MRI brain- Scattered punctate subcortical T2 hyperintensities bilaterally are slightly greater than expected for age. The finding is nonspecific but can be seen in the setting of chronic microvascular ischemia, a demyelinating process such as multiple...   09/03/2015 Progression CT CAP- demonstrates new hepatic lesions   09/03/2015 Imaging CT CAP- Multiple new hepatic metastasis in the R hepatic lobe corresponds to subtle hypodensities identified on comparison exam.  Stable metastatic bilateral pulmonary nodules and masses. Small mesenteric lymph nodes along the sigmoid colon are unchanged   09/24/2015 -  Chemotherapy Stivarga 160 mg daily 21/28 days   12/23/2015 - 12/25/2015 Hospital Admission Rectal Bleeding, RUQ pain   12/24/2015 Imaging CT chest- Significant progression of multiple B/L pulmonary metastases. Mild progression of mediastinal lymphadenopathy. Significant progression of multiple hepatic metastases. Mass in the sigmoid colon, increasing in prominence, concerning for progressio   12/24/2015 Imaging CT head- No acute intracranial abnormalities. No significant mass effect or focal lesion identified.   12/24/2015 Progression CT scans demonstrate progression of disease.   12/31/2015 Miscellaneous Consult at [facility] for consideration of clinical trial.  Seen by Dr. Teryl Lucy.    She is doing very well.  She had an instance of pain in the RUQ of abdomen.  She notes that her pain medication at home, Percocet, makes her sleepy and therefore, she does not like  the medication.  As a result, she was changed to Tramadol.  She notes that this is effective with less side effects.  As a result, she is using that medication when  needed.  She notes some nausea that has not be aborted with typical antiemetics.  She has no antiemetics on her medication list.  She notes that it rarely leads to vomiting.  Review of Systems  Constitutional: Positive for malaise/fatigue. Negative for fever, chills and weight loss.  HENT: Negative.   Eyes: Negative.   Respiratory: Negative.   Cardiovascular: Negative.   Gastrointestinal: Negative.   Genitourinary: Negative.   Musculoskeletal: Negative.   Skin: Negative.   Neurological: Negative.  Negative for weakness.  Endo/Heme/Allergies: Negative.   Psychiatric/Behavioral: Negative.     Past Medical History  Diagnosis Date  . Hypertension   . Depression   . Pulmonary nodules 01/30/14    CTA CHEST  . Lymphadenopathy, mediastinal 01/2014    CTA ANGIO .[place]  . Morbid obesity ([place])   . Diabetes mellitus, type II ([place])   . Headache(784.0)   . Medically noncompliant 09/06/2015  . Cancer ([place])     stage 4 colon with mets to liver and lung    Past Surgical History  Procedure Laterality Date  . Cholecystectomy    . Video bronchoscopy with endobronchial ultrasound N/A 02/06/2014    Procedure: VIDEO BRONCHOSCOPY WITH ENDOBRONCHIAL ULTRASOUND,bronchial biopsies , node sampling;  Surgeon: Melrose Nakayama, MD;  Location: [facility] OR;  Service: Thoracic;  Laterality: N/A;  . Tubal ligation      Family History  Problem Relation Age of Onset  . Cancer Mother     UTERINE  . Cancer Father     LEUKEMIA  . Crohn's disease Son   . Colon cancer Neg Hx     Social History   Social History  . Marital Status: Widowed    Spouse Name: N/A  . Number of Children: N/A  . Years of Education: N/A   Social History Main Topics  . Smoking status: Former Smoker -- 0.50 packs/day for 7 years    Types: Cigarettes    Quit date: 02/03/1989  . Smokeless tobacco: Never Used  . Alcohol Use: Yes     Comment: occasional  . Drug Use: No  . Sexual Activity: Not Asked   Other Topics  Concern  . None   Social History Narrative     PHYSICAL EXAMINATION  ECOG PERFORMANCE STATUS: 0 - Asymptomatic  Filed Vitals:   01/29/16 0931  BP: 136/91  Pulse: 96  Temp: 97.8 F (36.6 C)  Resp: 16    GENERAL:alert, no distress, well nourished, well developed, comfortable, cooperative, obese, smiling and unaccompanied SKIN: skin color, texture, turgor are normal, no rashes or significant lesions HEAD: Normocephalic, No masses, lesions, tenderness or abnormalities EYES: normal, EOMI, Conjunctiva are pink and non-injected EARS: External ears normal OROPHARYNX:lips, buccal mucosa, and tongue normal and mucous membranes are moist  NECK: supple, trachea midline LYMPH:  no palpable lymphadenopathy BREAST:not examined LUNGS: clear to auscultation  HEART: regular rate & rhythm, no murmurs, no gallops, S1 normal and S2 normal ABDOMEN:abdomen soft, non-tender, obese and normal bowel sounds BACK: Back symmetric, no curvature. EXTREMITIES:less then 2 second capillary refill, no joint deformities, effusion, or inflammation, no skin discoloration, no cyanosis  NEURO: alert & oriented x 3 with fluent speech, no focal motor/sensory deficits, gait normal   LABORATORY DATA: CBC    Component Value Date/Time   WBC 16.4* 01/06/2016 0017  RBC 4.47 01/06/2016 0017   RBC 4.36 11/16/2014 1214   HGB 12.4 01/06/2016 0017   HCT 38.3 01/06/2016 0017   PLT 313 01/06/2016 0017   MCV 85.7 01/06/2016 0017   MCH 27.7 01/06/2016 0017   MCHC 32.4 01/06/2016 0017   RDW 15.8* 01/06/2016 0017   LYMPHSABS 4.6* 12/23/2015 2100   MONOABS 1.2* 12/23/2015 2100   EOSABS 0.4 12/23/2015 2100   BASOSABS 0.1 12/23/2015 2100      Chemistry      Component Value Date/Time   NA 134* 01/06/2016 0017   K 3.5 01/06/2016 0017   CL 102 01/06/2016 0017   CO2 23 01/06/2016 0017   BUN 9 01/06/2016 0017   CREATININE 0.52 01/06/2016 0017      Component Value Date/Time   CALCIUM 9.1 01/06/2016 0017   ALKPHOS  130* 01/06/2016 0017   AST 55* 01/06/2016 0017   ALT 59* 01/06/2016 0017   BILITOT 0.5 01/06/2016 0017     Lab Results  Component Value Date   CEA 384.2* 11/23/2015      PENDING LABS:   RADIOGRAPHIC STUDIES:  No results found.   PATHOLOGY:    ASSESSMENT AND PLAN:  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 [facility]) Stage IV colon cancer, KRAS mutated, with imaging on 12/24/2015 showing progression of disease resulting in a referral to [facility] for consideration of clinical trial.  She has seen Dr. Teryl Lucy at [facility] on 12/31/2015.  Oncology history is updated.  Having not heard from Dr. Teryl Lucy regarding the patient's status with enrollment in clinical trial, I sent him an email on 01/28/2016 requesting an update. This morning, 01/29/2016, Dr. Pia Mau requested I ascertain [facility] testing to help guide enrollment into clinical trials.  I have called [facility] Pathology requesting FoundationOne be sent off on past specimen. I received a return telephone call from [facility]) reporting concern about the amount of tissue available for next generation testing.  I'm concerned about insufficient material for testing. As a result, I have sent a message to interventional radiology, Dr. Jacqulynn Cadet, requesting his recommendations regarding biopsy to ascertain tissue for molecular testing.    Labs today CBC diff, CMET.  I personally reviewed and went over laboratory results with the patient.  The results are noted within this dictation.  I will keep Dr. Teryl Lucy at [facility] apprised of the patient's status and as a matter of fact, I have updated him via secure e-mail.  She notes intermittent nausea.  She is not taking any antiemetics because "they don't work."  She is agreeable to have medication on hand.  I have prescribed PO Ativan 0.5 mg every 4 hours PRN nausea/vomiting.  Return in 3-4 weeks for follow-up.  This appointment can be moved according to Dr. Romero Liner  recommendations.  Addendum: Dr. Geroge Baseman has reviewed the scans and he will try to expedite this patient's biopsy to send for [facility] testing.  I will ask nursing to call pathology to cancel sending pathology off for FoundationOne testing on old specimen and we will send off tissue on new specimen.  Order is placed for US guided biopsy.  I have called the patient with this updated information and she is agreeable to pursue re-biopsy.  Additionally, she notes significant response to Ativan for nausea as mentioned above.    ORDERS PLACED FOR THIS ENCOUNTER: Orders Placed This Encounter  Procedures  . US Biopsy    MEDICATIONS PRESCRIBED THIS ENCOUNTER: Meds ordered this encounter  Medications  . LORazepam (  ATIVAN) 0.5 MG tablet    Sig: Take 1 tablet (0.5 mg total) by mouth every 6 (six) hours as needed (nasuea/vomiting).    Dispense:  60 tablet    Refill:  1    Order Specific Question:  Supervising Provider    Answer:  Patrici Ranks [id number]    THERAPY PLAN:  Will ascertain FoundationOne testing and send to Dr. Pia Mau for consideration of clinical trials.  All questions were answered. The patient knows to call the clinic with any problems, questions or concerns. We can certainly see the patient much sooner if necessary.  Patient and plan discussed with Dr. Ancil Linsey and she is in agreement with the aforementioned.   This note is electronically signed by: Doy Mince 01/29/2016 5:24 PM

## 2016-02-01 ENCOUNTER — Other Ambulatory Visit: Payer: Self-pay | Admitting: Radiology

## 2016-02-04 ENCOUNTER — Ambulatory Visit (HOSPITAL_COMMUNITY)
Admission: RE | Admit: 2016-02-04 | Discharge: 2016-02-04 | Disposition: A | Discharge status: Home / Self Care | Source: Ambulatory Visit | Attending: Oncology | Admitting: Oncology

## 2016-02-04 ENCOUNTER — Encounter (HOSPITAL_COMMUNITY): Payer: Self-pay

## 2016-02-04 DIAGNOSIS — Z79899 Other long term (current) drug therapy: Secondary | ICD-10-CM | POA: Insufficient documentation

## 2016-02-04 DIAGNOSIS — Z6841 Body Mass Index (BMI) 40.0 and over, adult: Secondary | ICD-10-CM | POA: Insufficient documentation

## 2016-02-04 DIAGNOSIS — E119 Type 2 diabetes mellitus without complications: Secondary | ICD-10-CM | POA: Diagnosis not present

## 2016-02-04 DIAGNOSIS — K769 Liver disease, unspecified: Secondary | ICD-10-CM | POA: Diagnosis present

## 2016-02-04 DIAGNOSIS — Z8049 Family history of malignant neoplasm of other genital organs: Secondary | ICD-10-CM | POA: Diagnosis not present

## 2016-02-04 DIAGNOSIS — Z886 Allergy status to analgesic agent status: Secondary | ICD-10-CM | POA: Diagnosis not present

## 2016-02-04 DIAGNOSIS — C189 Malignant neoplasm of colon, unspecified: Secondary | ICD-10-CM | POA: Diagnosis not present

## 2016-02-04 DIAGNOSIS — C787 Secondary malignant neoplasm of liver and intrahepatic bile duct: Secondary | ICD-10-CM | POA: Insufficient documentation

## 2016-02-04 DIAGNOSIS — Z87891 Personal history of nicotine dependence: Secondary | ICD-10-CM | POA: Insufficient documentation

## 2016-02-04 DIAGNOSIS — Z806 Family history of leukemia: Secondary | ICD-10-CM | POA: Insufficient documentation

## 2016-02-04 DIAGNOSIS — I10 Essential (primary) hypertension: Secondary | ICD-10-CM | POA: Diagnosis not present

## 2016-02-04 DIAGNOSIS — C7800 Secondary malignant neoplasm of unspecified lung: Secondary | ICD-10-CM

## 2016-02-04 LAB — CBC
HCT: 39.2 % (ref 36.0–46.0)
Hemoglobin: 12.3 g/dL (ref 12.0–15.0)
MCH: 27 pg (ref 26.0–34.0)
MCHC: 31.4 g/dL (ref 30.0–36.0)
MCV: 86 fL (ref 78.0–100.0)
PLATELETS: 381 10*3/uL (ref 150–400)
RBC: 4.56 MIL/uL (ref 3.87–5.11)
RDW: 15 % (ref 11.5–15.5)
WBC: 13.6 10*3/uL — AB (ref 4.0–10.5)

## 2016-02-04 LAB — PROTIME-INR
INR: 1.18 (ref 0.00–1.49)
PROTHROMBIN TIME: 15.2 s (ref 11.6–15.2)

## 2016-02-04 LAB — APTT: APTT: 32 s (ref 24–37)

## 2016-02-04 MED ORDER — MIDAZOLAM HCL 2 MG/2ML IJ SOLN
INTRAMUSCULAR | Status: AC
  Filled 2016-02-04: qty 4

## 2016-02-04 MED ORDER — FENTANYL CITRATE (PF) 100 MCG/2ML IJ SOLN
INTRAMUSCULAR | Status: AC | PRN
  Administered 2016-02-04 (×2): 25 ug via INTRAVENOUS
  Administered 2016-02-04: 50 ug via INTRAVENOUS

## 2016-02-04 MED ORDER — SODIUM CHLORIDE 0.9 % IV SOLN
INTRAVENOUS | Status: AC | PRN
  Administered 2016-02-04: 10 mL/h via INTRAVENOUS

## 2016-02-04 MED ORDER — HYDROCODONE-ACETAMINOPHEN 5-325 MG PO TABS: 1.0000 | ORAL_TABLET | ORAL | Status: DC | PRN

## 2016-02-04 MED ORDER — FENTANYL CITRATE (PF) 100 MCG/2ML IJ SOLN
INTRAMUSCULAR | Status: AC
  Filled 2016-02-04: qty 4

## 2016-02-04 MED ORDER — LIDOCAINE HCL (PF) 1 % IJ SOLN
INTRAMUSCULAR | Status: AC
  Filled 2016-02-04: qty 10

## 2016-02-04 MED ORDER — ONDANSETRON HCL 4 MG/2ML IJ SOLN
4.0000 mg | Freq: Once | INTRAMUSCULAR | Status: AC
  Administered 2016-02-04: 4 mg via INTRAVENOUS

## 2016-02-04 MED ORDER — MIDAZOLAM HCL 2 MG/2ML IJ SOLN
INTRAMUSCULAR | Status: AC | PRN
  Administered 2016-02-04 (×2): 1 mg via INTRAVENOUS

## 2016-02-04 MED ORDER — SODIUM CHLORIDE 0.9 % IV SOLN: Freq: Once | INTRAVENOUS | Status: DC

## 2016-02-04 MED ORDER — ONDANSETRON HCL 4 MG/2ML IJ SOLN
INTRAMUSCULAR | Status: AC
  Filled 2016-02-04: qty 2

## 2016-02-04 NOTE — Discharge Instructions (Signed)

## 2016-02-04 NOTE — Sedation Documentation (Signed)
Patient is resting comfortably. 

## 2016-02-04 NOTE — Sedation Documentation (Signed)
O2 2l/[place] applied

## 2016-02-04 NOTE — H&P (Signed)
Chief Complaint: Patient was seen in consultation today for liver lesion biopsy at the request of [place]  Referring Physician(s): Baird Cancer  Supervising Physician: Markus Daft  Patient Status: Outpatient  History of Present Illness: Erica Barker is a 55 y.o. female with stage IV colon with mets to the lung and now liver. She is a potential candidate for clinical trial at [facility] but they need a new tissue sample for molecular testing. She is referred to IR for biopsy of liver lesion seen on recent CT PMHx, meds, labs, imaging, allergies reviewed. She has been NPO today Daughter at bedside. Some nausea, given Zofran '4mg'$  IV x 1 in Short Stay  Past Medical History:  Diagnosis Date  . Cancer ([place])    stage 4 colon with mets to liver and lung  . Depression   . Diabetes mellitus, type II ([place])   . Headache(784.0)   . Hypertension   . Lymphadenopathy, mediastinal 01/2014   CTA ANGIO .[place]land [place]  . Medically noncompliant 09/06/2015  . Morbid obesity ([place])   . Pulmonary nodules 01/30/14   CTA CHEST    Past Surgical History:  Procedure Laterality Date  . CHOLECYSTECTOMY    . TUBAL LIGATION    . VIDEO BRONCHOSCOPY WITH ENDOBRONCHIAL ULTRASOUND N/A 02/06/2014   Procedure: VIDEO BRONCHOSCOPY WITH ENDOBRONCHIAL ULTRASOUND,bronchial biopsies , node sampling;  Surgeon: Melrose Nakayama, MD;  Location: [facility] OR;  Service: Thoracic;  Laterality: N/A;    Allergies: Asa [aspirin]  Medications: Prior to Admission medications   Medication Sig Start Date End Date Taking? Authorizing Provider  Cyanocobalamin (B-12) 1000 MCG SUBL Place 1 mL under the tongue daily as needed (LOW ENERGY).   Yes Historical Provider, MD  dexlansoprazole (DEXILANT) 60 MG capsule Take 1 capsule (60 mg total) by mouth daily. Patient taking differently: Take 60 mg by mouth daily as needed.  10/01/15  Yes Manon Hilding Kefalas, PA-C  gabapentin (NEURONTIN) 300 MG capsule Take 300 mg by mouth  at bedtime.   Yes Historical Provider, MD  lidocaine-prilocaine (EMLA) cream Apply 1 application topically as needed.   Yes Historical Provider, MD  lisinopril (PRINIVIL,ZESTRIL) 10 MG tablet Take 10 mg by mouth daily.   Yes Historical Provider, MD  LORazepam (ATIVAN) 0.5 MG tablet Take 1 tablet (0.5 mg total) by mouth every 6 (six) hours as needed (nasuea/vomiting). 01/29/16  Yes Baird Cancer, PA-C  Multiple Vitamin (MULTIVITAMIN) capsule Take 1 capsule by mouth daily.   Yes Historical Provider, MD  oxyCODONE-acetaminophen (PERCOCET/ROXICET) 5-325 MG tablet Take 1 tablet by mouth every 4 (four) hours as needed for severe pain. 12/28/15  Yes Patrici Ranks, MD  sucralfate (CARAFATE) 1 GM/10ML suspension Take 10 mLs (1 g total) by mouth 4 (four) times daily -  with meals and at bedtime. 11/09/15  Yes Patrici Ranks, MD  traMADol (ULTRAM) 50 MG tablet Take 1 tablet (50 mg total) by mouth every 6 (six) hours as needed for moderate pain. 01/08/16  Yes Manon Hilding Kefalas, PA-C  baclofen (LIORESAL) 10 MG tablet Take 10 mg by mouth 2 (two) times daily as needed for muscle spasms.    Historical Provider, MD     Family History  Problem Relation Age of Onset  . Cancer Mother     UTERINE  . Cancer Father     LEUKEMIA  . Crohn's disease Son   . Colon cancer Neg Hx     Social History   Social History  . Marital status:  Widowed    Spouse name: N/A  . Number of children: N/A  . Years of education: N/A   Social History Main Topics  . Smoking status: Former Smoker    Packs/day: 0.50    Years: 7.00    Types: Cigarettes    Quit date: 02/03/1989  . Smokeless tobacco: Never Used  . Alcohol use Yes     Comment: occasional  . Drug use: No  . Sexual activity: Not Asked   Other Topics Concern  . None   Social History Narrative  . None     Review of Systems: A 12 point ROS discussed and pertinent positives are indicated in the HPI above.  All other systems are negative.  Review of  Systems  Vital Signs: BP (!) 132/92   Pulse 99   Temp 98.3 F (36.8 C) (Oral)   Resp 18   Ht '5\' 4"'$  (1.626 m)   Wt 240 lb (108.9 kg)   LMP 10/12/2013   SpO2 100%   BMI 41.20 kg/m   Physical Exam  Constitutional: She is oriented to person, place, and time. She appears well-developed. No distress.  Obese AA female  HENT:  Head: Normocephalic.  Mouth/Throat: Oropharynx is clear and moist.  Neck: Normal range of motion. No JVD present. No tracheal deviation present.  Cardiovascular: Normal rate, regular rhythm and normal heart sounds.   Pulmonary/Chest: Effort normal. No respiratory distress. She has no wheezes. She has no rales.  Abdominal: Soft. She exhibits no mass. There is no tenderness.  Neurological: She is alert and oriented to person, place, and time.  Psychiatric: She has a normal mood and affect. Judgment normal.    Mallampati Score:  MD Evaluation Airway: WNL Heart: WNL Abdomen: WNL Chest/ Lungs: WNL ASA  Classification: 2 Mallampati/Airway Score: One  Imaging: No results found.  Labs:  CBC:  Recent Labs  12/24/15 0405 12/24/15 0821 01/06/16 0017 02/04/16 1133  WBC 15.0* 14.7* 16.4* 13.6*  HGB 11.6* 12.1 12.4 12.3  HCT 37.2 38.7 38.3 39.2  PLT 273 296 313 381    COAGS:  Recent Labs  12/24/15 0405 02/04/16 1133  INR 1.59* 1.18  APTT 32 32    BMP:  Recent Labs  11/23/15 1048 12/23/15 2100 12/23/15 2120 12/24/15 0405 01/06/16 0017  NA 135 136 139 134* 134*  K 3.2* 3.6 3.5 3.5 3.5  CL 101 102 101 102 102  CO2 25 25  --  24 23  GLUCOSE 210* 169* 174* 216* 238*  BUN '11 10 9 9 9  '$ CALCIUM 9.1 9.0  --  8.6* 9.1  CREATININE 0.63 0.85 0.90 0.64 0.52  GFRNONAA >60 >60  --  >60 >60  GFRAA >60 >60  --  >60 >60    LIVER FUNCTION TESTS:  Recent Labs  11/23/15 1048 12/23/15 2100 12/24/15 0405 01/06/16 0017  BILITOT 0.6 0.4 0.5 0.5  AST 24 33 24 55*  ALT '21 25 22 '$ 59*  ALKPHOS 103 115 108 130*  PROT 8.2* 8.1 7.6 8.0  ALBUMIN  3.6 3.5 3.3* 3.6    TUMOR MARKERS:  Recent Labs  08/07/15 0948 10/17/15 1015 11/09/15 1015 11/23/15 1048  CEA 187.7* 303.7* 314.1* 384.2*    Assessment and Plan: Stage IV colon cancer with liver lesions For US guided biopsy of liver lesion Labs ok Risks and Benefits discussed with the patient including, but not limited to bleeding, infection, damage to adjacent structures or low yield requiring additional tests. All of the patient's questions were  answered, patient is agreeable to proceed. Consent signed and in chart.    Thank you for this interesting consult.  A copy of this report was sent to the requesting provider on this date.  Electronically Signed: Ascencion Dike 02/04/2016, 12:47 PM   I spent a total of 20 minutes in face to face in clinical consultation, greater than 50% of which was counseling/coordinating care for liver biopsy

## 2016-02-04 NOTE — Procedures (Signed)
US guided core biopsy of right hepatic lobe lesion.  4 cores obtained.  No immediate complication. Minimal blood loss.

## 2016-02-04 NOTE — Sedation Documentation (Signed)
O2 d/c'd 

## 2016-02-04 NOTE — Sedation Documentation (Signed)
Transport her to take to P13, daughter at side.  Pt alert and oriented no c/o pain.  Tolerated well.

## 2016-02-14 ENCOUNTER — Other Ambulatory Visit (HOSPITAL_COMMUNITY): Payer: Self-pay | Admitting: Emergency Medicine

## 2016-02-14 ENCOUNTER — Telehealth: Payer: Self-pay | Admitting: *Deleted

## 2016-02-14 NOTE — Telephone Encounter (Signed)
Call from patient asking "if she has reached office of Dr. Julien Nordmann and is he taking new patients.  I was told about case study and need to continue treatment of some type.  I feel like I'm at a standstill just waiting in [place]."  Advised she speak with providers in [place].   Will send phone documentation.

## 2016-02-15 NOTE — Telephone Encounter (Signed)
Nursing:  Please call patient.  Let her know that pathology, as we discussed with her previously, was sent for [facility] testing and we are waiting to hear results.  Once we have results, we will share them with her physician at [facility] for consideration of clinical trial.  It was sent off by pathology on 7/25 or 7/26.  It take about 2 weeks to get results.  We should have them next week.    Marilynne Dupuis, PA-C 02/15/2016 2:08 PM

## 2016-02-18 ENCOUNTER — Encounter (HOSPITAL_COMMUNITY): Payer: Self-pay

## 2016-02-18 ENCOUNTER — Telehealth (HOSPITAL_COMMUNITY): Payer: Self-pay | Admitting: *Deleted

## 2016-02-18 ENCOUNTER — Emergency Department (HOSPITAL_COMMUNITY)
Admission: EM | Admit: 2016-02-18 | Discharge: 2016-02-18 | Disposition: A | Discharge status: Home / Self Care | Attending: Emergency Medicine | Admitting: Emergency Medicine

## 2016-02-18 ENCOUNTER — Emergency Department (HOSPITAL_COMMUNITY)

## 2016-02-18 DIAGNOSIS — I10 Essential (primary) hypertension: Secondary | ICD-10-CM | POA: Diagnosis not present

## 2016-02-18 DIAGNOSIS — R079 Chest pain, unspecified: Secondary | ICD-10-CM | POA: Diagnosis not present

## 2016-02-18 DIAGNOSIS — R0602 Shortness of breath: Secondary | ICD-10-CM | POA: Insufficient documentation

## 2016-02-18 DIAGNOSIS — Z79899 Other long term (current) drug therapy: Secondary | ICD-10-CM | POA: Insufficient documentation

## 2016-02-18 DIAGNOSIS — R1110 Vomiting, unspecified: Secondary | ICD-10-CM

## 2016-02-18 DIAGNOSIS — Z87891 Personal history of nicotine dependence: Secondary | ICD-10-CM | POA: Diagnosis not present

## 2016-02-18 DIAGNOSIS — E119 Type 2 diabetes mellitus without complications: Secondary | ICD-10-CM | POA: Insufficient documentation

## 2016-02-18 DIAGNOSIS — R1011 Right upper quadrant pain: Secondary | ICD-10-CM | POA: Diagnosis not present

## 2016-02-18 DIAGNOSIS — R112 Nausea with vomiting, unspecified: Secondary | ICD-10-CM | POA: Diagnosis not present

## 2016-02-18 LAB — COMPREHENSIVE METABOLIC PANEL
ALK PHOS: 178 U/L — AB (ref 38–126)
ALT: 45 U/L (ref 14–54)
ANION GAP: 10 (ref 5–15)
AST: 51 U/L — ABNORMAL HIGH (ref 15–41)
Albumin: 3.2 g/dL — ABNORMAL LOW (ref 3.5–5.0)
BUN: 6 mg/dL (ref 6–20)
CALCIUM: 8.9 mg/dL (ref 8.9–10.3)
CHLORIDE: 97 mmol/L — AB (ref 101–111)
CO2: 27 mmol/L (ref 22–32)
Creatinine, Ser: 0.53 mg/dL (ref 0.44–1.00)
GFR calc non Af Amer: >60 mL/min (ref >60)
GLUCOSE: 205 mg/dL — AB (ref 65–99)
POTASSIUM: 3.2 mmol/L — AB (ref 3.5–5.1)
SODIUM: 134 mmol/L — AB (ref 135–145)
Total Bilirubin: 0.5 mg/dL (ref 0.3–1.2)
Total Protein: 8.1 g/dL (ref 6.5–8.1)

## 2016-02-18 LAB — CBC WITH DIFFERENTIAL/PLATELET
Basophils Absolute: 0.1 10*3/uL (ref 0.0–0.1)
Basophils Relative: 0 %
Eosinophils Absolute: 0.3 10*3/uL (ref 0.0–0.7)
Eosinophils Relative: 2 %
HEMATOCRIT: 37.6 % (ref 36.0–46.0)
HEMOGLOBIN: 11.9 g/dL — AB (ref 12.0–15.0)
LYMPHS ABS: 3.3 10*3/uL (ref 0.7–4.0)
LYMPHS PCT: 23 %
MCH: 27 pg (ref 26.0–34.0)
MCHC: 31.6 g/dL (ref 30.0–36.0)
MCV: 85.5 fL (ref 78.0–100.0)
MONO ABS: 1.4 10*3/uL — AB (ref 0.1–1.0)
MONOS PCT: 10 %
NEUTROS ABS: 9.5 10*3/uL — AB (ref 1.7–7.7)
NEUTROS PCT: 65 %
Platelets: 418 10*3/uL — ABNORMAL HIGH (ref 150–400)
RBC: 4.4 MIL/uL (ref 3.87–5.11)
RDW: 15 % (ref 11.5–15.5)
WBC: 14.5 10*3/uL — ABNORMAL HIGH (ref 4.0–10.5)

## 2016-02-18 LAB — LIPASE, BLOOD: Lipase: 19 U/L (ref 11–51)

## 2016-02-18 MED ORDER — SODIUM CHLORIDE 0.9 % IV SOLN: INTRAVENOUS | Status: DC

## 2016-02-18 MED ORDER — SODIUM CHLORIDE 0.9 % IV BOLUS (SEPSIS)
500.0000 mL | Freq: Once | INTRAVENOUS | Status: AC
  Administered 2016-02-18: 500 mL via INTRAVENOUS

## 2016-02-18 MED ORDER — METHYLPREDNISOLONE SODIUM SUCC 125 MG IJ SOLR: 125.0000 mg | Freq: Once | INTRAMUSCULAR | Status: DC

## 2016-02-18 MED ORDER — ONDANSETRON HCL 4 MG/2ML IJ SOLN
4.0000 mg | Freq: Once | INTRAMUSCULAR | Status: AC
  Administered 2016-02-18: 4 mg via INTRAVENOUS
  Filled 2016-02-18: qty 2

## 2016-02-18 MED ORDER — HEPARIN SOD (PORK) LOCK FLUSH 100 UNIT/ML IV SOLN
INTRAVENOUS | Status: AC
  Administered 2016-02-18: 500 [IU]
  Filled 2016-02-18: qty 5

## 2016-02-18 MED ORDER — HYDROCODONE-ACETAMINOPHEN 5-325 MG PO TABS: 1.0000 | ORAL_TABLET | Freq: Four times a day (QID) | ORAL | 0 refills | Status: DC | PRN

## 2016-02-18 MED ORDER — FENTANYL CITRATE (PF) 100 MCG/2ML IJ SOLN
50.0000 ug | Freq: Once | INTRAMUSCULAR | Status: AC
  Administered 2016-02-18: 50 ug via INTRAVENOUS
  Filled 2016-02-18: qty 2

## 2016-02-18 MED ORDER — HYDROMORPHONE HCL 1 MG/ML IJ SOLN
1.0000 mg | Freq: Once | INTRAMUSCULAR | Status: AC
  Administered 2016-02-18: 1 mg via INTRAVENOUS
  Filled 2016-02-18: qty 1

## 2016-02-18 NOTE — ED Provider Notes (Addendum)
Erica Barker DEPT Provider Note   CSN: [id number] Arrival date & time: 02/18/16  1004  First Provider Contact:  First MD Initiated Contact with Patient 02/18/16 1103     By signing my name below, I, Rayna Sexton, attest that this documentation has been prepared under the direction and in the presence of Fredia Sorrow, MD. Electronically Signed: Rayna Sexton, ED Scribe. 02/18/16. 11:55 AM.   History   Chief Complaint Chief Complaint  Patient presents with  . Emesis    HPI HPI Comments: Erica Barker is a 55 y.o. female who presents to the Emergency Department complaining of intermittent n/v x 1 week which worsened beginning 3 days ago. Pt states that she discontinued chemotherapy treatments 1 month ago for stage 4 colon cancer which was dx in July of 2015 and metastasized to her liver and lungs. Since discontinuing her chemotherapy she states she began experiencing worsening symptoms with her n/v beginning 1 week ago. Pt states she vomited 4-5 times yesterday and denies having vomited today. Her n/v worsens s/p eating. She states the last time she met with her oncologist (Dr. Whitney Muse) was 2 weeks ago and notes she was recently transferred to a new oncologist at [facility] due to the severity of her condition. Her next appointment at [facility] is tomorrow. She states the mass on her liver pushes against her diaphragm and causes moderate abd pain. Pt has taken phenergan and Zofran without significant relief. She has taken tramadol for pain management and has not taken her 5 mg oxycodone due to it "making me feel like a zombie". Pt denies any surgeries were performed for her cancer. Pt discontinued her rx Xarelto 2 months ago. She denies any other associated symptoms at this time.   The history is provided by the patient. No language interpreter was used.  Emesis   This is a recurrent problem. The current episode started more than 2 days ago. The problem has not changed since onset.The  emesis has an appearance of stomach contents. There has been no fever. Associated symptoms include abdominal pain and chills. Pertinent negatives include no cough, no diarrhea, no fever and no headaches.    Past Medical History:  Diagnosis Date  . Cancer ([place])    stage 4 colon with mets to liver and lung  . Depression   . Diabetes mellitus, type II ([place])   . Headache(784.0)   . Hypertension   . Lymphadenopathy, mediastinal 01/2014   [place]  . Medically noncompliant 09/06/2015  . Morbid obesity ([place])   . Pulmonary nodules 01/30/14   CTA CHEST    Patient Active Problem List   Diagnosis Date Noted  . Rectal bleeding 12/24/2015  . Colon cancer metastasized to liver ([place])   . Malignant neoplasm of colon ([place])   . Medically noncompliant 09/06/2015  . 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 ([place]) 10/09/2014  . Diabetes mellitus type 2 in obese ([place]) 02/03/2014  . Obesity, morbid ([place]) 02/03/2014  . Hypertension   . Depression   . Pulmonary nodules 01/30/2014  . Lymphadenopathy, mediastinal 01/11/2014    Past Surgical History:  Procedure Laterality Date  . CHOLECYSTECTOMY    . TUBAL LIGATION    . VIDEO BRONCHOSCOPY WITH ENDOBRONCHIAL ULTRASOUND N/A 02/06/2014   Procedure: VIDEO BRONCHOSCOPY WITH ENDOBRONCHIAL ULTRASOUND,bronchial biopsies , node sampling;  Surgeon: Melrose Nakayama, MD;  Location: [place];  Service: Thoracic;  Laterality: N/A;    OB History    No data available  Home Medications    Prior to Admission medications   Medication Sig Start Date End Date Taking? Authorizing Provider  acetaminophen (TYLENOL) 500 MG tablet Take 500 mg by mouth every 6 (six) hours as needed for moderate pain.   Yes Historical Provider, MD  baclofen (LIORESAL) 10 MG tablet Take 10 mg by mouth 2 (two) times daily as needed for muscle spasms.   Yes Historical Provider, MD  Cyanocobalamin (B-12) 1000 MCG SUBL Place 1 mL under the tongue daily as needed (LOW ENERGY).    Yes Historical Provider, MD  dexlansoprazole (DEXILANT) 60 MG capsule Take 1 capsule (60 mg total) by mouth daily. Patient taking differently: Take 60 mg by mouth daily as needed.  10/01/15  Yes Manon Hilding Kefalas, PA-C  gabapentin (NEURONTIN) 300 MG capsule Take 300 mg by mouth at bedtime.   Yes Historical Provider, MD  lidocaine-prilocaine (EMLA) cream Apply 1 application topically as needed.   Yes Historical Provider, MD  lisinopril (PRINIVIL,ZESTRIL) 10 MG tablet Take 10 mg by mouth daily.   Yes Historical Provider, MD  LORazepam (ATIVAN) 0.5 MG tablet Take 1 tablet (0.5 mg total) by mouth every 6 (six) hours as needed (nasuea/vomiting). 01/29/16  Yes Baird Cancer, PA-C  Multiple Vitamin (MULTIVITAMIN) capsule Take 1 capsule by mouth daily.   Yes Historical Provider, MD  oxyCODONE-acetaminophen (PERCOCET/ROXICET) 5-325 MG tablet Take 1 tablet by mouth every 4 (four) hours as needed for severe pain. 12/28/15  Yes Patrici Ranks, MD  promethazine (PHENERGAN) 25 MG suppository Place 1 suppository rectally every 6 (six) hours as needed for nausea/vomiting.   Yes Historical Provider, MD  sucralfate (CARAFATE) 1 GM/10ML suspension Take 10 mLs (1 g total) by mouth 4 (four) times daily -  with meals and at bedtime. 11/09/15  Yes Patrici Ranks, MD  traMADol (ULTRAM) 50 MG tablet Take 1 tablet (50 mg total) by mouth every 6 (six) hours as needed for moderate pain. 01/08/16  Yes Baird Cancer, PA-C  HYDROcodone-acetaminophen (NORCO/VICODIN) 5-325 MG tablet Take 1-2 tablets by mouth every 6 (six) hours as needed. 02/18/16   Fredia Sorrow, MD    Family History Family History  Problem Relation Age of Onset  . Cancer Mother     UTERINE  . Cancer Father     LEUKEMIA  . Crohn's disease Son   . Colon cancer Neg Hx     Social History Social History  Substance Use Topics  . Smoking status: Former Smoker    Packs/day: 0.50    Years: 7.00    Types: Cigarettes    Quit date: 02/03/1989  .  Smokeless tobacco: Never Used  . Alcohol use Yes     Comment: occasional     Allergies   Asa [aspirin]   Review of Systems Review of Systems  Constitutional: Positive for chills. Negative for fever.  HENT: Negative for congestion, postnasal drip, rhinorrhea, sinus pressure and sore throat.   Eyes: Negative for visual disturbance.  Respiratory: Positive for shortness of breath (due to pain). Negative for cough.   Cardiovascular: Positive for chest pain. Negative for leg swelling.  Gastrointestinal: Positive for abdominal pain, nausea and vomiting. Negative for diarrhea.  Genitourinary: Negative for dysuria.  Musculoskeletal: Negative for back pain.  Skin: Negative for rash.  Allergic/Immunologic: Positive for immunocompromised state.  Neurological: Negative for weakness and headaches.  Hematological: Does not bruise/bleed easily.  Psychiatric/Behavioral: Negative for confusion.  All other systems reviewed and are negative.  Physical Exam Updated Vital Signs BP  147/96 (BP Location: Left Arm)   Pulse 85   Temp 97.7 F (36.5 C) (Oral)   Resp 16   Ht '5\' 4"'$  (1.626 m)   Wt 108.9 kg   LMP 10/12/2013   SpO2 98%   BMI 41.20 kg/m   Physical Exam  Constitutional: She is oriented to person, place, and time. She appears well-developed. She appears distressed.  HENT:  Head: Normocephalic and atraumatic.  Mouth/Throat: Oropharynx is clear and moist.  Eyes: Conjunctivae and EOM are normal. Pupils are equal, round, and reactive to light. No scleral icterus.  Neck: Normal range of motion. Neck supple. No tracheal deviation present.  Cardiovascular: Normal rate, regular rhythm, normal heart sounds and intact distal pulses.   Pulmonary/Chest: Breath sounds normal. No respiratory distress. She has no wheezes. She has no rales.  Port-O-Cath noted to right upper chest  Abdominal: Soft. Bowel sounds are normal. There is tenderness (RUQ).  Musculoskeletal: Normal range of motion.  Trace  edema noted to bilateral ankles  Neurological: She is alert and oriented to person, place, and time. No cranial nerve deficit. She exhibits normal muscle tone. Coordination normal.  Skin: Skin is warm and dry.  Psychiatric: She has a normal mood and affect. Her behavior is normal.  Nursing note and vitals reviewed.  ED Treatments / Results  Labs (all labs ordered are listed, but only abnormal results are displayed) Labs Reviewed  COMPREHENSIVE METABOLIC PANEL - Abnormal; Notable for the following:       Result Value   Sodium 134 (*)    Potassium 3.2 (*)    Chloride 97 (*)    Glucose, Bld 205 (*)    Albumin 3.2 (*)    AST 51 (*)    Alkaline Phosphatase 178 (*)    All other components within normal limits  CBC WITH DIFFERENTIAL/PLATELET - Abnormal; Notable for the following:    WBC 14.5 (*)    Hemoglobin 11.9 (*)    Platelets 418 (*)    Neutro Abs 9.5 (*)    Monocytes Absolute 1.4 (*)    All other components within normal limits  LIPASE, BLOOD   Results for orders placed or performed during the hospital encounter of 02/18/16  Comprehensive metabolic panel  Result Value Ref Range   Sodium 134 (L) 135 - 145 mmol/L   Potassium 3.2 (L) 3.5 - 5.1 mmol/L   Chloride 97 (L) 101 - 111 mmol/L   CO2 27 22 - 32 mmol/L   Glucose, Bld 205 (H) 65 - 99 mg/dL   BUN 6 6 - 20 mg/dL   Creatinine, Ser 0.53 0.44 - 1.00 mg/dL   Calcium 8.9 8.9 - 10.3 mg/dL   Total Protein 8.1 6.5 - 8.1 g/dL   Albumin 3.2 (L) 3.5 - 5.0 g/dL   AST 51 (H) 15 - 41 U/L   ALT 45 14 - 54 U/L   Alkaline Phosphatase 178 (H) 38 - 126 U/L   Total Bilirubin 0.5 0.3 - 1.2 mg/dL   GFR calc non Af Amer >60 >60 mL/min   GFR calc Af Amer >60 >60 mL/min   Anion gap 10 5 - 15  CBC with Differential  Result Value Ref Range   WBC 14.5 (H) 4.0 - 10.5 K/uL   RBC 4.40 3.87 - 5.11 MIL/uL   Hemoglobin 11.9 (L) 12.0 - 15.0 g/dL   HCT 37.6 36.0 - 46.0 %   MCV 85.5 78.0 - 100.0 fL   MCH 27.0 26.0 - 34.0 pg  MCHC 31.6 30.0 - 36.0  g/dL   RDW 15.0 11.5 - 15.5 %   Platelets 418 (H) 150 - 400 K/uL   Neutrophils Relative % 65 %   Neutro Abs 9.5 (H) 1.7 - 7.7 K/uL   Lymphocytes Relative 23 %   Lymphs Abs 3.3 0.7 - 4.0 K/uL   Monocytes Relative 10 %   Monocytes Absolute 1.4 (H) 0.1 - 1.0 K/uL   Eosinophils Relative 2 %   Eosinophils Absolute 0.3 0.0 - 0.7 K/uL   Basophils Relative 0 %   Basophils Absolute 0.1 0.0 - 0.1 K/uL  Lipase, blood  Result Value Ref Range   Lipase 19 11 - 51 U/L    EKG  EKG Interpretation None       Radiology Dg Abd Acute W/chest  Result Date: 02/18/2016 CLINICAL DATA:  Colon cancer.  Nausea and vomiting. EXAM: DG ABDOMEN ACUTE W/ 1V CHEST COMPARISON:  CT 12/23/2015. FINDINGS: Frontal chest again shows bilateral pulmonary metastases. Right Port-A-Cath tip overlies the mid SVC. No edema or focal airspace consolidation. Upright film shows no evidence for intraperitoneal free air. There is no evidence for gaseous bowel dilation to suggest obstruction. Multiple phleboliths project over the anatomic pelvis. Visualized bony anatomy is unremarkable. IMPRESSION: 1. Multiple bilateral pulmonary metastases. 2. No evidence for bowel perforation or obstruction. Electronically Signed   By: Misty Stanley M.D.   On: 02/18/2016 13:13    Procedures Procedures  DIAGNOSTIC STUDIES: Oxygen Saturation is 95% on RA, normal by my interpretation.    COORDINATION OF CARE: 11:55 AM Discussed next steps with pt. Pt verbalized understanding and is agreeable with the plan.    Medications Ordered in ED Medications  0.9 %  sodium chloride infusion (not administered)  ondansetron (ZOFRAN) injection 4 mg (4 mg Intravenous Given 02/18/16 1101)  fentaNYL (SUBLIMAZE) injection 50 mcg (50 mcg Intravenous Given 02/18/16 1100)  sodium chloride 0.9 % bolus 500 mL (500 mLs Intravenous New Bag/Given 02/18/16 1302)  ondansetron (ZOFRAN) injection 4 mg (4 mg Intravenous Given 02/18/16 1300)  HYDROmorphone (DILAUDID) injection 1 mg  (1 mg Intravenous Given 02/18/16 1308)     Initial Impression / Assessment and Plan / ED Course  I have reviewed the triage vital signs and the nursing notes.  Pertinent labs & imaging results that were available during my care of the patient were reviewed by me and considered in my medical decision making (see chart for details).  Clinical Course   Patient's main complaint for coming is been persistent nausea and vomiting this been worse over the past 2-3 days. Patient has follow-up with hematology oncology tomorrow. Patient has known metastatic colon cancer. Patient has completed the chemotherapy and has been referred to [facility] for consideration for experimental protocol.  Patient is taking Phenergan suppositories and does have Zofran at home for nausea and vomiting. Patient also with complaint of some mild right upper quadrant abdominal pain has been present for a while.  Concerns and our workup one was discussion of pain control. Patient has oxycodone at home but says it zonks her out so she doesn't take it. Currently only taking tramadol. We'll give her a trial of hydrocodone C if this helps control her pain better.  The other concern was for bowel obstruction. Acute abdominal series shows no evidence of any free air or bowel obstruction. Other concern would of possibly even for gallbladder or pancreas problem. Lipase is normal labs without any elevation in bilirubin. She does have a leukocytosis but this been  present for some time. Ultrasound of the gallbladder was not completed but doubt that this is acute cholecystitis.  Other concern would be for possible pulmonary embolus. She certainly is at high risk for that they're on shin saturations are 95% she is not tachycardic.  Labs also show evidence of some mild hypokalemia. Do not feel that oral supplementation is required at this time.  Patient symptoms are improved here with pain control. And antinausea medicine control no further vomiting  after the Zofran provided here.  I personally performed the services described in this documentation, which was scribed in my presence. The recorded information has been reviewed and is accurate.     Final Clinical Impressions(s) / ED Diagnoses   Final diagnoses:  Intractable vomiting with nausea, vomiting of unspecified type  Right upper quadrant pain    New Prescriptions New Prescriptions   HYDROCODONE-ACETAMINOPHEN (NORCO/VICODIN) 5-325 MG TABLET    Take 1-2 tablets by mouth every 6 (six) hours as needed.     Fredia Sorrow, MD 02/18/16 1339    Fredia Sorrow, MD 02/18/16 1341

## 2016-02-18 NOTE — Discharge Instructions (Signed)
Try the hydrocodone to help control the pain. Today's labs without evidence of any biliary obstruction. No evidence of bowel obstruction. Keep your appointment with hematology oncology for tomorrow. Continue take her Zofran and Phenergan.

## 2016-02-18 NOTE — ED Notes (Signed)
Pt tearful, pain is worse, MD aware, orders given

## 2016-02-18 NOTE — ED Triage Notes (Signed)
Pt complain of nausea, vomiting and not eating for a few weeks. States she is a cancer patient and she has been taken off some of her chemo meds.

## 2016-02-18 NOTE — ED Notes (Signed)
Charge going to discharge, states pt to sleepy to get out of bed at present, told family will hold pt 20-30 min

## 2016-02-19 ENCOUNTER — Encounter (HOSPITAL_COMMUNITY): Payer: Self-pay | Admitting: Oncology

## 2016-02-19 ENCOUNTER — Encounter (HOSPITAL_COMMUNITY): Attending: Oncology | Admitting: Oncology

## 2016-02-19 ENCOUNTER — Encounter (HOSPITAL_COMMUNITY): Payer: Self-pay | Admitting: Emergency Medicine

## 2016-02-19 ENCOUNTER — Encounter (HOSPITAL_COMMUNITY)

## 2016-02-19 DIAGNOSIS — C787 Secondary malignant neoplasm of liver and intrahepatic bile duct: Secondary | ICD-10-CM | POA: Diagnosis not present

## 2016-02-19 DIAGNOSIS — C189 Malignant neoplasm of colon, unspecified: Secondary | ICD-10-CM | POA: Diagnosis not present

## 2016-02-19 DIAGNOSIS — C7800 Secondary malignant neoplasm of unspecified lung: Secondary | ICD-10-CM | POA: Diagnosis not present

## 2016-02-19 MED ORDER — PROMETHAZINE HCL 25 MG RE SUPP: 25.0000 mg | Freq: Four times a day (QID) | RECTAL | 1 refills | Status: AC | PRN

## 2016-02-19 MED ORDER — HEPARIN SOD (PORK) LOCK FLUSH 100 UNIT/ML IV SOLN
INTRAVENOUS | Status: AC
  Filled 2016-02-19: qty 5

## 2016-02-19 MED ORDER — HYDROCODONE-ACETAMINOPHEN 5-325 MG PO TABS: 1.0000 | ORAL_TABLET | Freq: Four times a day (QID) | ORAL | 0 refills | Status: DC | PRN

## 2016-02-19 NOTE — Patient Instructions (Signed)
[place] at [facility] Discharge Instructions  RECOMMENDATIONS MADE BY THE CONSULTANT AND ANY TEST RESULTS WILL BE SENT TO YOUR REFERRING PHYSICIAN.  You saw Kirby Crigler, PA-C today. We are still waiting on the specialized testing (Foundation One) to come back. Tom refilled you pain pills and nausea suppositories. Return for follow up in 2-3 weeks.  Thank you for choosing [place] at [facility] to provide your oncology and hematology care.  To afford each patient quality time with our provider, please arrive at least 15 minutes before your scheduled appointment time.   Beginning January 23rd 2017 lab work for the [name] will be done in the  Main lab at [facility] on 1st floor. If you have a lab appointment with the [facility] please come in thru the  Main Entrance and check in at the main information desk  You need to re-schedule your appointment should you arrive 10 or more minutes late.  We strive to give you quality time with our providers, and arriving late affects you and other patients whose appointments are after yours.  Also, if you no show three or more times for appointments you may be dismissed from the clinic at the providers discretion.     Again, thank you for choosing [facility].  Our hope is that these requests will decrease the amount of time that you wait before being seen by our physicians.       _____________________________________________________________  Should you have questions after your visit to [facility], please contact our office at [phone] between the hours of 8:30 a.m. and 4:30 p.m.  Voicemails left after 4:30 p.m. will not be returned until the following business day.  For prescription refill requests, have your pharmacy contact our office.         Resources For Cancer Patients and their Caregivers ? American Cancer Society: Can assist with transportation,  wigs, general needs, runs Look Good Feel Better.        680-389-0783 ? Cancer Care: Provides financial assistance, online support groups, medication/co-pay assistance.  1-800-813-HOPE 539-584-9091) ? [place] Assists [place] cancer patients and their families through emotional , educational and financial support.  (859) 496-1991 ? [place] DSS Where to apply for food stamps, Medicaid and utility assistance. (762) 197-3565 ? [facility]: Transportation to medical appointments. 628-570-1858 ? Social Security Administration: May apply for disability if have a Stage IV cancer. (737)128-3260 346-800-7507 ? [employer], Disability and Transit Services: Assists with nutrition, care and transit needs. [place] Support Programs: '@10RELATIVEDAYS'$ @ > Cancer Support Group  2nd Tuesday of the month 1pm-2pm, Journey Room  > Creative Journey  3rd Tuesday of the month 1130am-1pm, Journey Room  > Look Good Feel Better  1st Wednesday of the month 10am-12 noon, Journey Room (Call [place] to register [phone])

## 2016-02-19 NOTE — Assessment & Plan Note (Addendum)
Stage IV colon cancer, KRAS mutated, with imaging on 12/24/2015 showing progression of disease resulting in a referral to [facility] for consideration of clinical trial.  She has seen Dr. Teryl Lucy at [facility] on 12/31/2015.  Re-biopsy of hepatic lesion took place on 02/04/2016 with pathology reporting metastatic adenocarcinoma of GI primary.  This specimen was sent for FoundationOne testing.  Oncology history is updated.  Labs performed yesterday: CBC diff, CMET.  I personally reviewed and went over laboratory results with the patient.  The results are noted within this dictation.  Hypokalemia is noted and addressed.  ED visit yesterday noted for nausea with vomiting.  I will keep Dr. Teryl Lucy at [facility] apprised of the patient's FoundationONE results as I expect them this week.    I have refilled her phenergan suppositories and Hydrocodone.    Pain regimen is discussed.  For now, we will continue with Hydrocodone every 4 hours PRN.  She is advised that if her pain is not well controlled on this regimen, or she needs increased pain medication, we will need to alter her pain regimen to include a long-acting opoid.   Erica Barker has experienced a difficult time understanding the lack of treatment options that are available to her at this stage.  As a result, we are hopeful that she qualifies for a clinical trial.  HOWEVER, I frankly asked the patient today about her thoughts if she is not a candidate for a clinical trial.  She has a difficult time understanding that she may not be a candidate for further treatment.  "What about comfort care?" I asked.  "What's that?!" she responded.  I broached the definition of palliative care, again, with the patient.    Return in 2-3 weeks for follow-up.  This appointment can be moved according to Dr. Romero Liner recommendations.

## 2016-02-19 NOTE — Progress Notes (Signed)
No PCP Per Patient No address on file  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 [facility]) - Plan: HYDROcodone-acetaminophen (NORCO/VICODIN) 5-325 MG tablet, promethazine (PHENERGAN) 25 MG suppository  CURRENT THERAPY: Waiting to hear about clinical trial enrollment.  INTERVAL HISTORY: Erica Barker 55 y.o. female returns for followup of Stage IV colon cancer, KRAS mutated, with imaging on 12/24/2015 showing progression of disease resulting in a referral to [facility] for consideration of clinical trial.    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 ([place])   02/20/2014 Imaging    CT of abdomen and pelvis on 02/20/2014 at [facility] showing 3.6 cm apple core lesion in the proximal sigmoid colon 11 mm hypoenhancing lesion in the medial left hepatic dome, 4.1 cm right lower lobe mass and adjacent right lower lobe nodule     02/23/2014 Pathology Results    KRAS MUTATED     04/17/2014 Imaging    Right upper extremity DVT 04/17/2014 paired brachial veins, axillary vein, and peripheral aspect of the right subclavian vein     07/06/2014 Imaging    CT chest 07/06/2014 with bilateral pulmonary nodules and masses, RUL, lateral RUL, posterior LUL, lobulated nodule in the subpleural RLL     08/18/2014 Tumor Marker    CEA 93.7 ng/ml     10/09/2014 Initial Diagnosis    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     10/11/2014 - 01/29/2015 Chemotherapy    FOLFOX + Avastin beginning at [facility]     01/29/2015 Imaging    Port study- Unremarkable Port-A-Cath injection.     02/19/2015 Imaging    Progressive pulmonary metastatic disease as detailed above. Stable mediastinal lymphadenopathy. No abdominal/pelvic metastatic disease is identified     04/24/2015 - 08/07/2015 Chemotherapy    XELIRI started on 04/24/2015. Patient did not want to wear 5-FU pump.  On 2000 mg BID 14 days on and 7 days off.     04/28/2015 Adverse Reaction    Mild stomatitis.  Xeloda held.  Treated with Magic Mouthwash.  Resolved by  05/02/2015 at office visit.     05/15/2015 Treatment Plan Change    Will restart Xeloda at 1500 mg BID 14 days on and 7 days off (11/1)     07/17/2015 Adverse Reaction    Mouth sores     07/17/2015 Treatment Plan Change    Change in Xeloda frequency: 1500 mg BID 7 days on and 7 days off.     07/24/2015 Adverse Reaction    Increased nausea/vomiting     08/07/2015 Treatment Plan Change    Irinotecan dose reduced by 10%     08/10/2015 Imaging    MRI brain- Scattered punctate subcortical T2 hyperintensities bilaterally are slightly greater than expected for age. The finding is nonspecific but can be seen in the setting of chronic microvascular ischemia, a demyelinating process such as multiple...     09/03/2015 Progression    CT CAP- demonstrates new hepatic lesions     09/03/2015 Imaging    CT CAP- Multiple new hepatic metastasis in the R hepatic lobe corresponds to subtle hypodensities identified on comparison exam.  Stable metastatic bilateral pulmonary nodules and masses. Small mesenteric lymph nodes along the sigmoid colon are unchanged     09/24/2015 -  Chemotherapy    Stivarga 160 mg daily 21/28 days     12/23/2015 - 12/25/2015 Hospital Admission    Rectal Bleeding, RUQ pain     12/24/2015 Imaging    CT chest- Significant progression of  multiple B/L pulmonary metastases. Mild progression of mediastinal lymphadenopathy. Significant progression of multiple hepatic metastases. Mass in the sigmoid colon, increasing in prominence, concerning for progressio     12/24/2015 Imaging    CT head- No acute intracranial abnormalities. No significant mass effect or focal lesion identified.     12/24/2015 Progression    CT scans demonstrate progression of disease.     12/31/2015 Miscellaneous    Consult at [facility] for consideration of clinical trial.  Seen by Dr. Teryl Lucy.     02/04/2016 Procedure    US guided core biopsy of right hepatic lobe lesion by Dr. Anselm Pancoast.  4 cores obtained.      02/05/2016 Pathology Results    Liver, needle/core biopsy, Right Hepatic Lobe - METASTATIC ADENOCARCINOMA, CONSISTENT WITH COLONIC PRIMARY.  Sent for [facility] testing.      The patient reports nausea with intermittent vomiting. She denies any vomiting in the past 24 hours. Her nausea is controlled with Phenergan suppositories in addition to sublingual Ativan.  Her biggest complaint is right upper quadrant abdominal pain. She was prescribed hydrocodone when she visited the emergency department yesterday. She notes that it is effective and she is utilizing hydrocodone approximate 4 times per day. She is avoided oxycodone as it makes her into a "zombie." She admits to hydrocodone causing drowsiness resulting in sleep. She also notes that it is effective in controlling her pain.  Symptomatically, the patient had a major change that is concerning.  Review of Systems  Constitutional: Positive for malaise/fatigue. Negative for chills, fever and weight loss.  HENT: Negative.   Eyes: Negative.   Respiratory: Negative.  Negative for cough and shortness of breath.   Cardiovascular: Negative.  Negative for chest pain.  Gastrointestinal: Positive for abdominal pain, nausea and vomiting.  Genitourinary: Negative.   Musculoskeletal: Negative.   Skin: Negative.   Neurological: Negative.  Negative for weakness.  Endo/Heme/Allergies: Negative.   Psychiatric/Behavioral: Negative.     Past Medical History:  Diagnosis Date  . Cancer ([place])    stage 4 colon with mets to liver and lung  . Depression   . Diabetes mellitus, type II ([place])   . Headache(784.0)   . Hypertension   . Lymphadenopathy, mediastinal 01/2014   [place] [facility]  . Medically noncompliant 09/06/2015  . Morbid obesity ([place])   . Pulmonary nodules 01/30/14   CTA CHEST    Past Surgical History:  Procedure Laterality Date  . CHOLECYSTECTOMY    . TUBAL LIGATION    . VIDEO BRONCHOSCOPY WITH ENDOBRONCHIAL ULTRASOUND N/A  02/06/2014   Procedure: VIDEO BRONCHOSCOPY WITH ENDOBRONCHIAL ULTRASOUND,bronchial biopsies , node sampling;  Surgeon: Melrose Nakayama, MD;  Location: [facility] OR;  Service: Thoracic;  Laterality: N/A;    Family History  Problem Relation Age of Onset  . Cancer Mother     UTERINE  . Cancer Father     LEUKEMIA  . Crohn's disease Son   . Colon cancer Neg Hx     Social History   Social History  . Marital status: Widowed    Spouse name: N/A  . Number of children: N/A  . Years of education: N/A   Social History Main Topics  . Smoking status: Former Smoker    Packs/day: 0.50    Years: 7.00    Types: Cigarettes    Quit date: 02/03/1989  . Smokeless tobacco: Never Used  . Alcohol use Yes     Comment: occasional  . Drug use: No  .  Sexual activity: Not Asked   Other Topics Concern  . None   Social History Narrative  . None     PHYSICAL EXAMINATION  ECOG PERFORMANCE STATUS: 0 - Asymptomatic  Vitals:   02/19/16 0800  BP: (!) 150/92  Pulse: 88  Resp: 18  Temp: 97.6 F (36.4 C)    GENERAL:alert, no distress, well nourished, well developed, uncomfortable, cooperative, obese, unaccompanied, in wheelchair SKIN: skin color, texture, turgor are normal, no rashes or significant lesions HEAD: Normocephalic, No masses, lesions, tenderness or abnormalities EYES: normal, EOMI, Conjunctiva are pink and non-injected EARS: External ears normal OROPHARYNX:lips, buccal mucosa, and tongue normal and mucous membranes are moist  NECK: supple, trachea midline LYMPH:  no palpable lymphadenopathy BREAST:not examined LUNGS: clear to auscultation  HEART: regular rate & rhythm, no murmurs, no gallops, S1 normal and S2 normal ABDOMEN:abdomen soft, RUQ tenderness to palpation limiting palpation of liver, obese and normal bowel sounds BACK: Back symmetric, no curvature. EXTREMITIES:less then 2 second capillary refill, no joint deformities, effusion, or inflammation, no skin discoloration, no  cyanosis  NEURO: alert & oriented x 3 with fluent speech, no focal motor/sensory deficits, gait normal   LABORATORY DATA: CBC    Component Value Date/Time   WBC 14.5 (H) 02/18/2016 1123   RBC 4.40 02/18/2016 1123   HGB 11.9 (L) 02/18/2016 1123   HCT 37.6 02/18/2016 1123   PLT 418 (H) 02/18/2016 1123   MCV 85.5 02/18/2016 1123   MCH 27.0 02/18/2016 1123   MCHC 31.6 02/18/2016 1123   RDW 15.0 02/18/2016 1123   LYMPHSABS 3.3 02/18/2016 1123   MONOABS 1.4 (H) 02/18/2016 1123   EOSABS 0.3 02/18/2016 1123   BASOSABS 0.1 02/18/2016 1123      Chemistry      Component Value Date/Time   NA 134 (L) 02/18/2016 1123   K 3.2 (L) 02/18/2016 1123   CL 97 (L) 02/18/2016 1123   CO2 27 02/18/2016 1123   BUN 6 02/18/2016 1123   CREATININE 0.53 02/18/2016 1123      Component Value Date/Time   CALCIUM 8.9 02/18/2016 1123   ALKPHOS 178 (H) 02/18/2016 1123   AST 51 (H) 02/18/2016 1123   ALT 45 02/18/2016 1123   BILITOT 0.5 02/18/2016 1123     Lab Results  Component Value Date   CEA 384.2 (H) 11/23/2015      PENDING LABS:   RADIOGRAPHIC STUDIES:  US Biopsy  Result Date: 02/04/2016 INDICATION: 55 year old with metastatic colon cancer. Patient needs additional tissue for molecular testing. EXAM: ULTRASOUND-GUIDED LIVER LESION BIOPSY MEDICATIONS: None. ANESTHESIA/SEDATION: Moderate (conscious) sedation was employed during this procedure. A total of Versed 2.0 mg and Fentanyl 100 mcg was administered intravenously. Moderate Sedation Time: 16 minutes. The patient's level of consciousness and vital signs were monitored continuously by radiology nursing throughout the procedure under my direct supervision. FLUOROSCOPY TIME:  None COMPLICATIONS: None immediate. PROCEDURE: Informed written consent was obtained from the patient after a thorough discussion of the procedural risks, benefits and alternatives. All questions were addressed. A timeout was performed prior to the initiation of the  procedure. Liver was evaluated with ultrasound. The right hepatic lobe was selected for biopsy. The right side of the abdomen was prepped with chlorhexidine and sterile field was created. The skin and soft tissues were anesthetized with 1% lidocaine. Using ultrasound guidance, 17 gauge coaxial needle was directed into the right hepatic lesion. Four core biopsies obtained with an 18 gauge core device. 17 gauge needle was removed without complication. Bandage  placed over the puncture site. Ultrasound images were taken and saved for this procedure. FINDINGS: Large irregular hypoechoic lesions scattered throughout the liver. Large portion of the inferior right hepatic lobe appears to be involved with tumor. This area was sampled. IMPRESSION: Ultrasound-guided core biopsies of a right hepatic lesion. Electronically Signed   By: Markus Daft M.D.   On: 02/04/2016 17:45  Dg Abd Acute W/chest  Result Date: 02/18/2016 CLINICAL DATA:  Colon cancer.  Nausea and vomiting. EXAM: DG ABDOMEN ACUTE W/ 1V CHEST COMPARISON:  CT 12/23/2015. FINDINGS: Frontal chest again shows bilateral pulmonary metastases. Right Port-A-Cath tip overlies the mid SVC. No edema or focal airspace consolidation. Upright film shows no evidence for intraperitoneal free air. There is no evidence for gaseous bowel dilation to suggest obstruction. Multiple phleboliths project over the anatomic pelvis. Visualized bony anatomy is unremarkable. IMPRESSION: 1. Multiple bilateral pulmonary metastases. 2. No evidence for bowel perforation or obstruction. Electronically Signed   By: Misty Stanley M.D.   On: 02/18/2016 13:13     PATHOLOGY:    ASSESSMENT AND PLAN:  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 [facility]) Stage IV colon cancer, KRAS mutated, with imaging on 12/24/2015 showing progression of disease resulting in a referral to [facility] for consideration of clinical trial.  She has seen Dr. Teryl Lucy at [facility] on 12/31/2015.  Re-biopsy of hepatic lesion  took place on 02/04/2016 with pathology reporting metastatic adenocarcinoma of GI primary.  This specimen was sent for [facility] testing.  Oncology history is updated.  Labs performed yesterday: CBC diff, CMET.  I personally reviewed and went over laboratory results with the patient.  The results are noted within this dictation.  Hypokalemia is noted and addressed.  ED visit yesterday noted for nausea with vomiting.  I will keep Dr. Teryl Lucy at [facility] apprised of the patient's [facility] results as I expect them this week.    I have refilled her phenergan suppositories and Hydrocodone.    Pain regimen is discussed.  For now, we will continue with Hydrocodone every 4 hours PRN.  She is advised that if her pain is not well controlled on this regimen, or she needs increased pain medication, we will need to alter her pain regimen to include a long-acting opoid.   Kalynne has experienced a difficult time understanding the lack of treatment options that are available to her at this stage.  As a result, we are hopeful that she qualifies for a clinical trial.  HOWEVER, I frankly asked the patient today about her thoughts if she is not a candidate for a clinical trial.  She has a difficult time understanding that she may not be a candidate for further treatment.  "What about comfort care?" I asked.  "What's that?!" she responded.  I broached the definition of palliative care, again, with the patient.    Return in 2-3 weeks for follow-up.  This appointment can be moved according to Dr. Romero Liner recommendations.     ORDERS PLACED FOR THIS ENCOUNTER: No orders of the defined types were placed in this encounter.   MEDICATIONS PRESCRIBED THIS ENCOUNTER: Meds ordered this encounter  Medications  . HYDROcodone-acetaminophen (NORCO/VICODIN) 5-325 MG tablet    Sig: Take 1-2 tablets by mouth every 6 (six) hours as needed.    Dispense:  60 tablet    Refill:  0    Order Specific Question:    Supervising Provider    Answer:   Patrici Ranks [id number]  . promethazine (PHENERGAN) 25 MG suppository  Sig: Place 1 suppository (25 mg total) rectally every 6 (six) hours as needed.    Dispense:  12 each    Refill:  1    Order Specific Question:   Supervising Provider    Answer:   Patrici Ranks [id number]    THERAPY PLAN:  Will ascertain [facility] testing and send to Dr. Pia Mau for consideration of clinical trials.  All questions were answered. The patient knows to call the clinic with any problems, questions or concerns. We can certainly see the patient much sooner if necessary.  Patient and plan discussed with Dr. Ancil Linsey and she is in agreement with the aforementioned.   This note is electronically signed by: Doy Mince 02/19/2016 9:53 AM

## 2016-02-19 NOTE — Progress Notes (Signed)
Called foundation one and she stated that everything was on track and that it should be back by Thursday.  02/21/2016

## 2016-02-19 NOTE — Progress Notes (Signed)
Patient does not need flush today.  Was done in ED on 02-18-2016. See flowsheet

## 2016-02-19 NOTE — Patient Instructions (Signed)
Erica Barker at [facility] Discharge Instructions  RECOMMENDATIONS MADE BY THE CONSULTANT AND ANY TEST RESULTS WILL BE SENT TO YOUR REFERRING PHYSICIAN. See office visit encounter  Thank you for choosing [place] at [facility] to provide your oncology and hematology care.  To afford each patient quality time with our provider, please arrive at least 15 minutes before your scheduled appointment time.   Beginning January 23rd 2017 lab work for the [name] will be done in the  Main lab at [facility] on 1st floor. If you have a lab appointment with the [name] please come in thru the  Main Entrance and check in at the main information desk  You need to re-schedule your appointment should you arrive 10 or more minutes late.  We strive to give you quality time with our providers, and arriving late affects you and other patients whose appointments are after yours.  Also, if you no show three or more times for appointments you may be dismissed from the clinic at the providers discretion.     Again, thank you for choosing [facility].  Our hope is that these requests will decrease the amount of time that you wait before being seen by our physicians.       _____________________________________________________________  Should you have questions after your visit to [facility], please contact our office at [phone] between the hours of 8:30 a.m. and 4:30 p.m.  Voicemails left after 4:30 p.m. will not be returned until the following business day.  For prescription refill requests, have your pharmacy contact our office.         Resources For Cancer Patients and their Caregivers ? American Cancer Society: Can assist with transportation, wigs, general needs, runs Look Good Feel Better.        320-764-8650 ? Cancer Care: Provides financial assistance, online support groups, medication/co-pay assistance.   1-800-813-HOPE 872-829-9922) ? [place] Assists [place] cancer patients and their families through emotional , educational and financial support.  531-611-4052 ? [place] DSS Where to apply for food stamps, Medicaid and utility assistance. 279-833-5326 ? [facility]: Transportation to medical appointments. 8023878175 ? Social Security Administration: May apply for disability if have a Stage IV cancer. (614)192-8324 250-245-7862 ? [employer], Disability and Transit Services: Assists with nutrition, care and transit needs. [place] Support Programs: '@10RELATIVEDAYS'$ @ > Cancer Support Group  2nd Tuesday of the month 1pm-2pm, Journey Room  > Creative Journey  3rd Tuesday of the month 1130am-1pm, Journey Room  > Look Good Feel Better  1st Wednesday of the month 10am-12 noon, Journey Room (Call [place] to register [phone])

## 2016-02-21 ENCOUNTER — Encounter (HOSPITAL_COMMUNITY): Payer: Self-pay

## 2016-02-22 ENCOUNTER — Encounter (HOSPITAL_COMMUNITY)

## 2016-02-26 ENCOUNTER — Emergency Department (HOSPITAL_COMMUNITY)
Admission: EM | Admit: 2016-02-26 | Discharge: 2016-02-26 | Disposition: A | Discharge status: Home / Self Care | Attending: Emergency Medicine | Admitting: Emergency Medicine

## 2016-02-26 ENCOUNTER — Encounter (HOSPITAL_COMMUNITY): Payer: Self-pay | Admitting: *Deleted

## 2016-02-26 DIAGNOSIS — C189 Malignant neoplasm of colon, unspecified: Secondary | ICD-10-CM

## 2016-02-26 DIAGNOSIS — E860 Dehydration: Secondary | ICD-10-CM | POA: Diagnosis not present

## 2016-02-26 DIAGNOSIS — Z79891 Long term (current) use of opiate analgesic: Secondary | ICD-10-CM | POA: Insufficient documentation

## 2016-02-26 DIAGNOSIS — C787 Secondary malignant neoplasm of liver and intrahepatic bile duct: Secondary | ICD-10-CM | POA: Diagnosis not present

## 2016-02-26 DIAGNOSIS — Z79899 Other long term (current) drug therapy: Secondary | ICD-10-CM | POA: Diagnosis not present

## 2016-02-26 DIAGNOSIS — R1011 Right upper quadrant pain: Secondary | ICD-10-CM | POA: Diagnosis not present

## 2016-02-26 DIAGNOSIS — R112 Nausea with vomiting, unspecified: Secondary | ICD-10-CM | POA: Diagnosis present

## 2016-02-26 DIAGNOSIS — I10 Essential (primary) hypertension: Secondary | ICD-10-CM | POA: Insufficient documentation

## 2016-02-26 DIAGNOSIS — C7800 Secondary malignant neoplasm of unspecified lung: Secondary | ICD-10-CM | POA: Diagnosis not present

## 2016-02-26 DIAGNOSIS — Z87891 Personal history of nicotine dependence: Secondary | ICD-10-CM | POA: Insufficient documentation

## 2016-02-26 DIAGNOSIS — E119 Type 2 diabetes mellitus without complications: Secondary | ICD-10-CM | POA: Diagnosis not present

## 2016-02-26 LAB — CBC
HEMATOCRIT: 39.4 % (ref 36.0–46.0)
Hemoglobin: 12.8 g/dL (ref 12.0–15.0)
MCH: 27.5 pg (ref 26.0–34.0)
MCHC: 32.5 g/dL (ref 30.0–36.0)
MCV: 84.7 fL (ref 78.0–100.0)
Platelets: 354 10*3/uL (ref 150–400)
RBC: 4.65 MIL/uL (ref 3.87–5.11)
RDW: 15.1 % (ref 11.5–15.5)
WBC: 15.7 10*3/uL — ABNORMAL HIGH (ref 4.0–10.5)

## 2016-02-26 LAB — URINALYSIS, ROUTINE W REFLEX MICROSCOPIC
BILIRUBIN URINE: NEGATIVE
GLUCOSE, UA: NEGATIVE mg/dL
KETONES UR: NEGATIVE mg/dL
Leukocytes, UA: NEGATIVE
Nitrite: NEGATIVE
PH: 5.5 (ref 5.0–8.0)
Protein, ur: NEGATIVE mg/dL
Specific Gravity, Urine: 1.01 (ref 1.005–1.030)

## 2016-02-26 LAB — COMPREHENSIVE METABOLIC PANEL
ALK PHOS: 219 U/L — AB (ref 38–126)
ALT: 56 U/L — ABNORMAL HIGH (ref 14–54)
ANION GAP: 9 (ref 5–15)
AST: 60 U/L — ABNORMAL HIGH (ref 15–41)
Albumin: 3.4 g/dL — ABNORMAL LOW (ref 3.5–5.0)
BUN: 5 mg/dL — ABNORMAL LOW (ref 6–20)
CALCIUM: 9 mg/dL (ref 8.9–10.3)
CO2: 25 mmol/L (ref 22–32)
CREATININE: 0.64 mg/dL (ref 0.44–1.00)
Chloride: 96 mmol/L — ABNORMAL LOW (ref 101–111)
Glucose, Bld: 201 mg/dL — ABNORMAL HIGH (ref 65–99)
Potassium: 3.1 mmol/L — ABNORMAL LOW (ref 3.5–5.1)
SODIUM: 130 mmol/L — AB (ref 135–145)
Total Bilirubin: 0.4 mg/dL (ref 0.3–1.2)
Total Protein: 8.5 g/dL — ABNORMAL HIGH (ref 6.5–8.1)

## 2016-02-26 LAB — LIPASE, BLOOD: LIPASE: 20 U/L (ref 11–51)

## 2016-02-26 LAB — URINE MICROSCOPIC-ADD ON

## 2016-02-26 MED ORDER — ONDANSETRON 4 MG PO TBDP
ORAL_TABLET | ORAL | Status: AC
Start: 2016-02-26 — End: 2016-02-26
  Administered 2016-02-26: 4 mg via ORAL
  Filled 2016-02-26: qty 1

## 2016-02-26 MED ORDER — HEPARIN SOD (PORK) LOCK FLUSH 100 UNIT/ML IV SOLN
INTRAVENOUS | Status: AC
  Administered 2016-02-26: 500 [IU]
  Filled 2016-02-26: qty 5

## 2016-02-26 MED ORDER — SODIUM CHLORIDE 0.9 % IV BOLUS (SEPSIS)
1000.0000 mL | Freq: Once | INTRAVENOUS | Status: AC
  Administered 2016-02-26: 1000 mL via INTRAVENOUS

## 2016-02-26 MED ORDER — POTASSIUM CHLORIDE CRYS ER 20 MEQ PO TBCR
40.0000 meq | EXTENDED_RELEASE_TABLET | Freq: Once | ORAL | Status: AC
  Administered 2016-02-26: 40 meq via ORAL
  Filled 2016-02-26: qty 2

## 2016-02-26 MED ORDER — PROCHLORPERAZINE MALEATE 10 MG PO TABS: 10.0000 mg | ORAL_TABLET | Freq: Two times a day (BID) | ORAL | 0 refills | Status: AC | PRN

## 2016-02-26 MED ORDER — ONDANSETRON 4 MG PO TBDP
4.0000 mg | ORAL_TABLET | Freq: Once | ORAL | Status: AC | PRN
  Administered 2016-02-26: 4 mg via ORAL

## 2016-02-26 MED ORDER — ONDANSETRON 4 MG PO TBDP
ORAL_TABLET | ORAL | Status: AC
  Filled 2016-02-26: qty 1

## 2016-02-26 MED ORDER — ONDANSETRON HCL 4 MG/2ML IJ SOLN
4.0000 mg | Freq: Once | INTRAMUSCULAR | Status: AC
  Administered 2016-02-26: 4 mg via INTRAVENOUS
  Filled 2016-02-26: qty 2

## 2016-02-26 NOTE — ED Provider Notes (Signed)
[name]   CSN: [id number] Arrival date & time: 02/26/16  1341     History   Chief Complaint Chief Complaint  Patient presents with  . Emesis    HPI Erica Barker is a 55 y.o. female.  Pt has a hx of stage IV colon cancer with nausea and vomiting for the past month.  Pt has RUQ pain from a liver met which has been ongoing.  The pt's nausea usually controlled with phenergan and zofran, but not today.  She has vomited multiple times today.  The pt was given 8 mg of zofran in the waiting room and nausea has improved.  Pt sees Dr. Whitney Muse for her colon cancer, but has an appt at [facility] for a clinical trial which has not yet started.  The pt has not had any chemo for a few months.      Past Medical History:  Diagnosis Date  . Cancer ([place])    stage 4 colon with mets to liver and lung  . Depression   . Diabetes mellitus, type II ([place])   . Headache(784.0)   . Hypertension   . Lymphadenopathy, mediastinal 01/2014   CTA ANGIO .Marland KitchenBowles  . Medically noncompliant 09/06/2015  . Morbid obesity ([place])   . Pulmonary nodules 01/30/14   CTA CHEST    Patient Active Problem List   Diagnosis Date Noted  . Rectal bleeding 12/24/2015  . Colon cancer metastasized to liver ([place])   . Malignant neoplasm of colon ([place])   . Medically noncompliant 09/06/2015  . 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 ([place]) 10/09/2014  . Diabetes mellitus type 2 in obese ([place]) 02/03/2014  . Obesity, morbid ([place]) 02/03/2014  . Hypertension   . Depression   . Pulmonary nodules 01/30/2014  . Lymphadenopathy, mediastinal 01/11/2014    Past Surgical History:  Procedure Laterality Date  . CHOLECYSTECTOMY    . TUBAL LIGATION    . VIDEO BRONCHOSCOPY WITH ENDOBRONCHIAL ULTRASOUND N/A 02/06/2014   Procedure: VIDEO BRONCHOSCOPY WITH ENDOBRONCHIAL ULTRASOUND,bronchial biopsies , node sampling;  Surgeon: Melrose Nakayama, MD;  Location: [facility];  Service: Thoracic;  Laterality: N/A;     OB History    No data available       Home Medications    Prior to Admission medications   Medication Sig Start Date End Date Taking? Authorizing Provider  acetaminophen (TYLENOL) 500 MG tablet Take 500 mg by mouth every 6 (six) hours as needed for moderate pain.   Yes Historical Provider, MD  dexlansoprazole (DEXILANT) 60 MG capsule Take 1 capsule (60 mg total) by mouth daily. 10/01/15  Yes Baird Cancer, PA-C  HYDROcodone-acetaminophen (NORCO/VICODIN) 5-325 MG tablet Take 1-2 tablets by mouth every 6 (six) hours as needed. Patient taking differently: Take 1-2 tablets by mouth every 6 (six) hours as needed for moderate pain.  02/19/16  Yes Baird Cancer, PA-C  lidocaine-prilocaine (EMLA) cream Apply 1 application topically as needed.   Yes Historical Provider, MD  lisinopril (PRINIVIL,ZESTRIL) 10 MG tablet Take 10 mg by mouth daily.   Yes Historical Provider, MD  LORazepam (ATIVAN) 0.5 MG tablet Take 1 tablet (0.5 mg total) by mouth every 6 (six) hours as needed (nasuea/vomiting). 01/29/16  Yes Baird Cancer, PA-C  Multiple Vitamin (MULTIVITAMIN) capsule Take 1 capsule by mouth daily.   Yes Historical Provider, MD  promethazine (PHENERGAN) 25 MG suppository Place 1 suppository (25 mg total) rectally every 6 (six) hours as needed. Patient taking differently: Place 25 mg rectally  every 6 (six) hours as needed for nausea or vomiting.  02/19/16  Yes Manon Hilding Kefalas, PA-C  sucralfate (CARAFATE) 1 GM/10ML suspension Take 10 mLs (1 g total) by mouth 4 (four) times daily -  with meals and at bedtime. 11/09/15  Yes Patrici Ranks, MD  oxyCODONE-acetaminophen (PERCOCET/ROXICET) 5-325 MG tablet Take 1 tablet by mouth every 4 (four) hours as needed for severe pain. Patient not taking: Reported on 02/26/2016 12/28/15   Patrici Ranks, MD  prochlorperazine (COMPAZINE) 10 MG tablet Take 1 tablet (10 mg total) by mouth 2 (two) times daily as needed for nausea or vomiting. 02/26/16   Isla Pence, MD  traMADol (ULTRAM) 50 MG tablet Take 1 tablet (50 mg total) by mouth every 6 (six) hours as needed for moderate pain. Patient not taking: Reported on 02/26/2016 01/08/16   Baird Cancer, PA-C    Family History Family History  Problem Relation Age of Onset  . Cancer Mother     UTERINE  . Cancer Father     LEUKEMIA  . Crohn's disease Son   . Colon cancer Neg Hx     Social History Social History  Substance Use Topics  . Smoking status: Former Smoker    Packs/day: 0.50    Years: 7.00    Types: Cigarettes    Quit date: 02/03/1989  . Smokeless tobacco: Never Used  . Alcohol use Yes     Comment: occasional     Allergies   Asa [aspirin]   Review of Systems Review of Systems  Gastrointestinal: Positive for abdominal pain, nausea and vomiting.  All other systems reviewed and are negative.    Physical Exam Updated Vital Signs BP (!) 148/115 (BP Location: Left Arm)   Pulse 95   Temp 98.9 F (37.2 C) (Oral)   Resp 20   Ht _0  (1.626 m)   Wt 232 lb (105.2 kg)   LMP 10/12/2013   SpO2 99%   BMI 39.82 kg/m   Physical Exam  Constitutional: She is oriented to person, place, and time. She appears well-developed and well-nourished.  HENT:  Head: Normocephalic and atraumatic.  Right Ear: External ear normal.  Left Ear: External ear normal.  Nose: Nose normal.  Mouth/Throat: Oropharynx is clear and moist.  Eyes: Conjunctivae and EOM are normal. Pupils are equal, round, and reactive to light.  Neck: Normal range of motion. Neck supple.  Cardiovascular: Normal rate, regular rhythm, normal heart sounds and intact distal pulses.   Pulmonary/Chest: Effort normal and breath sounds normal.  Abdominal: Soft. Bowel sounds are normal. There is tenderness in the right upper quadrant.  Musculoskeletal: Normal range of motion.  Neurological: She is alert and oriented to person, place, and time.  Skin: Skin is warm and dry.  Psychiatric: She has a normal mood and  affect. Her behavior is normal. Judgment and thought content normal.  Nursing note and vitals reviewed.    ED Treatments / Results  Labs (all labs ordered are listed, but only abnormal results are displayed) Labs Reviewed  COMPREHENSIVE METABOLIC PANEL - Abnormal; Notable for the following:       Result Value   Sodium 130 (*)    Potassium 3.1 (*)    Chloride 96 (*)    Glucose, Bld 201 (*)    BUN 5 (*)    Total Protein 8.5 (*)    Albumin 3.4 (*)    AST 60 (*)    ALT 56 (*)    Alkaline Phosphatase  219 (*)    All other components within normal limits  CBC - Abnormal; Notable for the following:    WBC 15.7 (*)    All other components within normal limits  URINALYSIS, ROUTINE W REFLEX MICROSCOPIC (NOT AT [facility]) - Abnormal; Notable for the following:    Hgb urine dipstick TRACE (*)    All other components within normal limits  URINE MICROSCOPIC-ADD ON - Abnormal; Notable for the following:    Squamous Epithelial / LPF 0-5 (*)    Bacteria, UA RARE (*)    All other components within normal limits  LIPASE, BLOOD    EKG  EKG Interpretation None       Radiology No results found.  Procedures Procedures (including critical care time)  Medications Ordered in ED Medications  ondansetron (ZOFRAN-ODT) 4 MG disintegrating tablet (not administered)  ondansetron (ZOFRAN-ODT) disintegrating tablet 4 mg (4 mg Oral Given 02/26/16 1357)  ondansetron (ZOFRAN-ODT) disintegrating tablet 4 mg (4 mg Oral Given 02/26/16 1400)  sodium chloride 0.9 % bolus 1,000 mL (0 mLs Intravenous Stopped 02/26/16 1949)  potassium chloride SA (K-DUR,KLOR-CON) CR tablet 40 mEq (40 mEq Oral Given 02/26/16 1806)     Initial Impression / Assessment and Plan / ED Course  I have reviewed the triage vital signs and the nursing notes.  Pertinent labs & imaging results that were available during my care of the patient were reviewed by me and considered in my medical decision making (see chart for  details).  Clinical Course   Pt is feeling much better.  She is able to tolerate po fluids.  She will be d/c'd home.  I will give her a rx for compazine to try.   Final Clinical Impressions(s) / ED Diagnoses   Final diagnoses:  Non-intractable vomiting with nausea, vomiting of unspecified type  Dehydration  Colon cancer metastasized to liver [facility])    New Prescriptions New Prescriptions   PROCHLORPERAZINE (COMPAZINE) 10 MG TABLET    Take 1 tablet (10 mg total) by mouth 2 (two) times daily as needed for nausea or vomiting.     Isla Pence, MD 02/26/16 2037

## 2016-02-26 NOTE — ED Triage Notes (Signed)
Pt comes in for vomiting (7/8 occurances in the last 24 hours), pt denies any diarrhea. Denies any pain.

## 2016-02-26 NOTE — ED Notes (Signed)
Pt given ODT zofran in triage.

## 2016-02-26 NOTE — ED Notes (Addendum)
Went in to discharge patient. Patient states she vomited. Verbal order for zofran '4mg'$  IV to be given before discharge. Patient states "I have phenergan suppositories at home I can take."

## 2016-02-27 ENCOUNTER — Telehealth (HOSPITAL_COMMUNITY): Payer: Self-pay | Admitting: Emergency Medicine

## 2016-02-27 NOTE — Telephone Encounter (Signed)
Called pt to let her know that she was not a candidate for any clinical trials or direct liver therapy.  We had two options.  Hospice that could extend length of life and provide her with symptom management, or lonsurf that wasn't not a good treatment option that could possibly have side effects.  If she wanted to try this drug she would need to come in for lab work.  Explained that hospice could be used as little or as much as she wanted and they they provided care in her home taht all hospice care was not facility based.  She wanted to talk about all of this with her children and call us back in a few days.

## 2016-03-07 ENCOUNTER — Ambulatory Visit (HOSPITAL_COMMUNITY): Admitting: Oncology

## 2016-03-27 ENCOUNTER — Encounter (HOSPITAL_COMMUNITY): Payer: Self-pay | Admitting: Emergency Medicine

## 2016-03-27 ENCOUNTER — Encounter (HOSPITAL_COMMUNITY): Payer: Self-pay | Admitting: Hematology & Oncology

## 2016-03-27 ENCOUNTER — Encounter (HOSPITAL_COMMUNITY): Attending: Oncology | Admitting: Hematology & Oncology

## 2016-03-27 VITALS — BP 146/94 | HR 100 | Temp 98.6°F | Resp 20 | Wt 226.5 lb

## 2016-03-27 DIAGNOSIS — C189 Malignant neoplasm of colon, unspecified: Secondary | ICD-10-CM

## 2016-03-27 DIAGNOSIS — C787 Secondary malignant neoplasm of liver and intrahepatic bile duct: Secondary | ICD-10-CM

## 2016-03-27 DIAGNOSIS — F32A Depression, unspecified: Secondary | ICD-10-CM

## 2016-03-27 DIAGNOSIS — F329 Major depressive disorder, single episode, unspecified: Secondary | ICD-10-CM

## 2016-03-27 DIAGNOSIS — B372 Candidiasis of skin and nail: Secondary | ICD-10-CM

## 2016-03-27 DIAGNOSIS — C7800 Secondary malignant neoplasm of unspecified lung: Secondary | ICD-10-CM | POA: Diagnosis not present

## 2016-03-27 DIAGNOSIS — K5720 Diverticulitis of large intestine with perforation and abscess without bleeding: Secondary | ICD-10-CM

## 2016-03-27 MED ORDER — ESCITALOPRAM OXALATE 20 MG PO TABS: ORAL_TABLET | ORAL | 6 refills | Status: DC

## 2016-03-27 MED ORDER — FLUCONAZOLE 100 MG PO TABS: 100.0000 mg | ORAL_TABLET | Freq: Every day | ORAL | 0 refills | Status: AC

## 2016-03-27 MED ORDER — NYSTATIN POWD: 1 refills | Status: DC

## 2016-03-27 NOTE — Patient Instructions (Addendum)
[place] at [facility] Discharge Instructions  RECOMMENDATIONS MADE BY THE CONSULTANT AND ANY TEST RESULTS WILL BE SENT TO YOUR REFERRING PHYSICIAN.  You saw Dr. Whitney Muse today. Prescriptions for Diflucan, Nystatin, and Lexapro have been sent to your pharmacy. Take the diflucan once daily for 14 days. Use the nystatin powder twice a day. Take half a Lexapro daily x 5 days then increase to a whole pill daily. Follow up in 2 weeks with Dr. Whitney Muse or Gershon Mussel. Please eat a "heavy meal" when you take the cipro and flagyl so they will not upset your stomach.  Thank you for choosing [place] at [facility] to provide your oncology and hematology care.  To afford each patient quality time with our provider, please arrive at least 15 minutes before your scheduled appointment time.   Beginning January 23rd 2017 lab work for the [name] will be done in the  Main lab at [facility] on 1st floor. If you have a lab appointment with the [facility] please come in thru the  Main Entrance and check in at the main information desk  You need to re-schedule your appointment should you arrive 10 or more minutes late.  We strive to give you quality time with our providers, and arriving late affects you and other patients whose appointments are after yours.  Also, if you no show three or more times for appointments you may be dismissed from the clinic at the providers discretion.     Again, thank you for choosing [facility].  Our hope is that these requests will decrease the amount of time that you wait before being seen by our physicians.       _____________________________________________________________  Should you have questions after your visit to [facility], please contact our office at [phone] between the hours of 8:30 a.m. and 4:30 p.m.  Voicemails left after 4:30 p.m. will not be returned until the following business day.   For prescription refill requests, have your pharmacy contact our office.         Resources For Cancer Patients and their Caregivers ? American Cancer Society: Can assist with transportation, wigs, general needs, runs Look Good Feel Better.        445 009 2158 ? Cancer Care: Provides financial assistance, online support groups, medication/co-pay assistance.  1-800-813-HOPE 343-721-3687) ? [place] Assists [place] cancer patients and their families through emotional , educational and financial support.  541 786 1718 ? [place] DSS Where to apply for food stamps, Medicaid and utility assistance. 602-462-0947 ? [facility]: Transportation to medical appointments. (276)400-1761 ? Social Security Administration: May apply for disability if have a Stage IV cancer. (610)582-4563 360-225-9291 ? [employer], Disability and Transit Services: Assists with nutrition, care and transit needs. [place] Support Programs: '@10RELATIVEDAYS'$ @ > Cancer Support Group  2nd Tuesday of the month 1pm-2pm, Journey Room  > Creative Journey  3rd Tuesday of the month 1130am-1pm, Journey Room  > Look Good Feel Better  1st Wednesday of the month 10am-12 noon, Journey Room (Call [place] to register [phone])

## 2016-03-27 NOTE — Progress Notes (Unsigned)
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  The report should be faxed to [place] within 24 hours at [phone].

## 2016-03-27 NOTE — Progress Notes (Signed)
[place] Progress Note  Patient Care Team: No Pcp Per Patient as PCP - General ([place]) Melrose Nakayama, MD as Consulting Physician (Cardiothoracic Surgery)  CHIEF COMPLAINTS/PURPOSE OF CONSULTATION:  Stage IV CRC CT of abdomen and pelvis on 02/20/2014 at [facility] showing 3.6 cm apple core lesion in the proximal sigmoid colon 11 mm hypoenhancing lesion in the medial left hepatic dome, 4.1 cm right lower lobe mass and adjacent right lower lobe nodule CEA on 08/18/2014 of 93.7 ng/ml Right upper extremity DVT 04/17/2014 paired brachial veins, axillary vein, and peripheral aspect of the right subclavian vein CT chest 07/06/2014 with bilateral pulmonary nodules and masses, RUL, lateral RUL, posterior LUL, lobulated nodule in the subpleural RLL,     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 ([place])   02/20/2014 Imaging    CT of abdomen and pelvis on 02/20/2014 at [facility] showing 3.6 cm apple core lesion in the proximal sigmoid colon 11 mm hypoenhancing lesion in the medial left hepatic dome, 4.1 cm right lower lobe mass and adjacent right lower lobe nodule      02/23/2014 Pathology Results    KRAS MUTATED      04/17/2014 Imaging    Right upper extremity DVT 04/17/2014 paired brachial veins, axillary vein, and peripheral aspect of the right subclavian vein      07/06/2014 Imaging    CT chest 07/06/2014 with bilateral pulmonary nodules and masses, RUL, lateral RUL, posterior LUL, lobulated nodule in the subpleural RLL      08/18/2014 Tumor Marker    CEA 93.7 ng/ml      10/09/2014 Initial Diagnosis    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      02/19/2015 Imaging    Progressive pulmonary metastatic disease as detailed above. Stable mediastinal lymphadenopathy. No abdominal/pelvic metastatic  disease is identified      04/24/2015 - 08/07/2015 Chemotherapy    XELIRI started on 04/24/2015. Patient did not want to wear 5-FU pump.  On 2000 mg BID 14 days on and 7 days off.      04/28/2015 Adverse Reaction    Mild stomatitis.  Xeloda held.  Treated with Magic Mouthwash.  Resolved by 05/02/2015 at office visit.      05/15/2015 Treatment Plan Change    Will restart Xeloda at 1500 mg BID 14 days on and 7 days off (11/1)      07/17/2015 Adverse Reaction    Mouth sores      07/17/2015 Treatment Plan Change    Change in Xeloda frequency: 1500 mg BID 7 days on and 7 days off.      07/24/2015 Adverse Reaction    Increased nausea/vomiting      08/07/2015 Treatment Plan Change    Irinotecan dose reduced by 10%      08/10/2015 Imaging    MRI brain- Scattered punctate subcortical T2 hyperintensities bilaterally are slightly greater than expected for age. The finding is nonspecific but can be seen in the setting of chronic microvascular ischemia, a demyelinating process such as multiple...      09/03/2015 Progression    CT CAP- demonstrates new hepatic lesions      09/03/2015 Imaging    CT CAP- Multiple new hepatic metastasis in the R hepatic lobe corresponds to subtle hypodensities identified on comparison exam.  Stable  metastatic bilateral pulmonary nodules and masses. Small mesenteric lymph nodes along the sigmoid colon are unchanged      09/24/2015 -  Chemotherapy    Stivarga 160 mg daily 21/28 days      12/23/2015 - 12/25/2015 Hospital Admission    Rectal Bleeding, RUQ pain      12/24/2015 Imaging    CT chest- Significant progression of multiple B/L pulmonary metastases. Mild progression of mediastinal lymphadenopathy. Significant progression of multiple hepatic metastases. Mass in the sigmoid colon, increasing in prominence, concerning for progressio      12/24/2015 Imaging    CT head- No acute intracranial abnormalities. No significant mass effect or focal lesion  identified.      12/24/2015 Progression    CT scans demonstrate progression of disease.      12/31/2015 Miscellaneous    Consult at [facility] for consideration of clinical trial.  Seen by Dr. Teryl Lucy.      02/04/2016 Procedure    US guided core biopsy of right hepatic lobe lesion by Dr. Anselm Pancoast.  4 cores obtained.      02/05/2016 Pathology Results    Liver, needle/core biopsy, Right Hepatic Lobe - METASTATIC ADENOCARCINOMA, CONSISTENT WITH COLONIC PRIMARY.  Sent for [facility] testing.       HISTORY OF PRESENTING ILLNESS:   Erica Barker 55 y.o. female is here because of stage IV colon cancer. Unfortunately is kras mutated and MSI stable.   Patient is seeing Dr. Remo Lipps Sorscher at [facility] for a clinical trial and has declined all clinical trials they offered her. When I asked the patient why, she said she does not have the financial means to travel. She was eligible for a trial with a mitochondrial inhibitor.     She was admitted to [facility] for a diverticular abscess. She complains of a yeast infection that she feels most likely has come from all the antibiotics she has been on.  Patient also reports feeling nauseas and fatigued.   She notes that her energy is not as good as it used to be and this frustrates her.  No vomiting. No BRBPR. Occasional RUQ pain.    MEDICAL HISTORY:  Past Medical History:  Diagnosis Date  . Cancer ([place])    stage 4 colon with mets to liver and lung  . Depression   . Diabetes mellitus, type II ([place])   . Headache(784.0)   . Hypertension   . Lymphadenopathy, mediastinal 01/2014   [place]  . Medically noncompliant 09/06/2015  . Morbid obesity ([place])   . Pulmonary nodules 01/30/14   CTA CHEST    SURGICAL HISTORY: Past Surgical History:  Procedure Laterality Date  . CHOLECYSTECTOMY    . TUBAL LIGATION    . VIDEO BRONCHOSCOPY WITH ENDOBRONCHIAL ULTRASOUND N/A 02/06/2014   Procedure: VIDEO BRONCHOSCOPY WITH  ENDOBRONCHIAL ULTRASOUND,bronchial biopsies , node sampling;  Surgeon: Melrose Nakayama, MD;  Location: [place];  Service: Thoracic;  Laterality: N/A;    SOCIAL HISTORY: Social History   Social History  . Marital status: Widowed    Spouse name: N/A  . Number of children: N/A  . Years of education: N/A   Occupational History  . Not on file.   Social History Main Topics  . Smoking status: Former Smoker    Packs/day: 0.50    Years: 7.00    Types: Cigarettes    Quit date: 02/03/1989  . Smokeless tobacco: Never Used  . Alcohol use Yes     Comment: occasional  .  Drug use: No  . Sexual activity: Not on file   Other Topics Concern  . Not on file   Social History Narrative  . No narrative on file  She is in a relationship. 3 children. Aged 32,35,20.  She has no grandchildren. She smoked in high school but quit after graduation. No ETOH. 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  She is originally from [place].  FAMILY HISTORY: Family History  Problem Relation Age of Onset  . Cancer Mother     UTERINE  . Cancer Father     LEUKEMIA  . Crohn's disease Son   . Colon cancer Neg Hx    indicated that her mother is alive. She indicated that her father is deceased. She indicated that her son is alive. She indicated that the status of her neg hx is unknown.    Father is deceased at age 67 from leukemia, mother is alive at 77 with a history of uterine cancer.  She has one sister how is healthy and one brother who is healthy. He works as a Emergency planning/management officer.   ALLERGIES:  is allergic to asa [aspirin].  MEDICATIONS:  Current Outpatient Prescriptions  Medication Sig Dispense Refill  . acetaminophen (TYLENOL) 500 MG tablet Take 500 mg by mouth every 6 (six) hours as needed for moderate pain.    . ciprofloxacin (CIPRO) 750 MG tablet Take 750 mg by mouth.    . dexlansoprazole (DEXILANT) 60 MG capsule Take 1 capsule (60 mg total) by mouth daily. 30  capsule 5  . docusate sodium (COLACE) 100 MG capsule Take 100 mg by mouth.    [place] HYDROcodone-acetaminophen (NORCO/VICODIN) 5-325 MG tablet Take 1-2 tablets by mouth every 6 (six) hours as needed. (Patient taking differently: Take 1-2 tablets by mouth every 6 (six) hours as needed for moderate pain. ) 60 tablet 0  . lidocaine-prilocaine (EMLA) cream Apply 1 application topically as needed.    Marland Kitchen lisinopril (PRINIVIL,ZESTRIL) 10 MG tablet Take 10 mg by mouth daily.    [place] LORazepam (ATIVAN) 0.5 MG tablet Take 1 tablet (0.5 mg total) by mouth every 6 (six) hours as needed (nasuea/vomiting). 60 tablet 1  . metroNIDAZOLE (FLAGYL) 500 MG tablet Take 500 mg by mouth.    . Multiple Vitamin (MULTIVITAMIN) capsule Take 1 capsule by mouth daily.    Marland Kitchen oxyCODONE-acetaminophen (PERCOCET/ROXICET) 5-325 MG tablet Take 1 tablet by mouth every 4 (four) hours as needed for severe pain. 60 tablet 0  . polyethylene glycol (MIRALAX / GLYCOLAX) packet Take 17 g by mouth.    . prochlorperazine (COMPAZINE) 10 MG tablet Take 1 tablet (10 mg total) by mouth 2 (two) times daily as needed for nausea or vomiting. 10 tablet 0  . promethazine (PHENERGAN) 25 MG suppository Place 1 suppository (25 mg total) rectally every 6 (six) hours as needed. (Patient taking differently: Place 25 mg rectally every 6 (six) hours as needed for nausea or vomiting. ) 12 each 1  . sucralfate (CARAFATE) 1 GM/10ML suspension Take 10 mLs (1 g total) by mouth 4 (four) times daily -  with meals and at bedtime. 420 mL 1  . traMADol (ULTRAM) 50 MG tablet Take 1 tablet (50 mg total) by mouth every 6 (six) hours as needed for moderate pain. 30 tablet 0   No current facility-administered medications for this visit.     Review of Systems  Constitutional: Positive for fatigue. Negative for fever, chills, and weight  loss.  HENT: Positive for headaches  Negative for congestion, hearing loss, nosebleeds, sore throat and tinnitus.   Described as migraines that occur  every 3-4 days, alleviated with pain medication.  Eyes: Negative for blurred vision, double vision, pain and discharge.  Respiratory: Negative for cough, hemoptysis, sputum production, shortness of breath and wheezing.   Cardiovascular: Negative for chest pain, palpitations, claudication, leg swelling and PND.  Gastrointestinal:  Positive for nausea, vomiting, abdominal pain, diarrhea, constipation, heartburn, blood in stool and melena.  Genitourinary: Negative for dysuria, urgency, frequency and hematuria.  Musculoskeletal: Negative for myalgias, joint pain and falls.  Skin:  Positive for pain on buttocks. Described as burning pain  Neurological: Negative for dizziness, tingling, tremors, sensory change, speech change, focal weakness, seizures, loss of consciousness, weakness. Endo/Heme/Allergies: Does not bruise/bleed easily.  Psychiatric/Behavioral: Negative for depression, suicidal ideas, memory loss and substance abuse. The patient is not nervous/anxious and does not have insomnia.   14 point review of systems was performed and is negative except as detailed under history of present illness and above  PHYSICAL EXAMINATION: ECOG PERFORMANCE STATUS: 2 - Symptomatic, <50% confined to bed  Vitals:   03/27/16 1008  BP: (!) 146/94  Pulse: 100  Resp: 20  Temp: 98.6 F (37 C)   Filed Weights   03/27/16 1008  Weight: 226 lb 8 oz (102.7 kg)    Physical Exam  Constitutional: She is oriented to person, place, and time and well-developed, well-nourished, and in no distress.  HENT:  Head: Normocephalic and atraumatic.  Nose: Nose normal.  Mouth/Throat: Oropharynx is clear and moist. No oropharyngeal exudate.  Eyes: Conjunctivae and EOM are normal. Pupils are equal, round, and reactive to light. Right eye exhibits no discharge. Left eye exhibits no discharge. No scleral icterus.  Neck: Normal range of motion. Neck supple. No tracheal deviation present. No thyromegaly present.   Cardiovascular: Normal rate, regular rhythm and normal heart sounds.  Exam reveals no gallop and no friction rub.  No murmur heard. Pulmonary/Chest: Effort normal and breath sounds normal. She has no wheezes. She has no rales.  Abdominal: Soft. Bowel sounds are normal. She exhibits no distension and no mass. There is no tenderness. There is no rebound and no guarding.  Musculoskeletal: Normal range of motion. She exhibits no edema.  Lymphadenopathy:    She has no cervical adenopathy.  Neurological: She is alert and oriented to person, place, and time. She has normal reflexes. No cranial nerve deficit. Gait normal. Coordination normal.  Skin: Perineal yeast infection. Skin is warm and dry. No rash noted.  Psychiatric: Mood, memory, affect and judgment normal.  Nursing note and vitals reviewed.   LABORATORY DATA:  I have reviewed the data as listed.  Results for KALIOPI, BLYDEN (MRN [id number]) as of 03/27/2016 08:40  Ref. Range 02/26/2016 15:54  Sodium Latest Ref Range: 135 - 145 mmol/L 130 (L)  Potassium Latest Ref Range: 3.5 - 5.1 mmol/L 3.1 (L)  Chloride Latest Ref Range: 101 - 111 mmol/L 96 (L)  CO2 Latest Ref Range: 22 - 32 mmol/L 25  BUN Latest Ref Range: 6 - 20 mg/dL 5 (L)  Creatinine Latest Ref Range: 0.44 - 1.00 mg/dL 0.64  Calcium Latest Ref Range: 8.9 - 10.3 mg/dL 9.0  EGFR (Non-African Amer.) Latest Ref Range: >60 mL/min >60  EGFR (African American) Latest Ref Range: >60 mL/min >60  Glucose Latest Ref Range: 65 - 99 mg/dL 201 (H)  Anion gap Latest Ref Range: 5 - 15  9  Alkaline Phosphatase Latest Ref Range: 38 - 126 U/L 219 (H)  Albumin Latest Ref Range: 3.5 - 5.0 g/dL 3.4 (L)  Lipase Latest Ref Range: 11 - 51 U/L 20  AST Latest Ref Range: 15 - 41 U/L 60 (H)  ALT Latest Ref Range: 14 - 54 U/L 56 (H)  Total Protein Latest Ref Range: 6.5 - 8.1 g/dL 8.5 (H)  Total Bilirubin Latest Ref Range: 0.3 - 1.2 mg/dL 0.4  WBC Latest Ref Range: 4.0 - 10.5 K/uL 15.7 (H)  RBC Latest  Ref Range: 3.87 - 5.11 MIL/uL 4.65  Hemoglobin Latest Ref Range: 12.0 - 15.0 g/dL 12.8  HCT Latest Ref Range: 36.0 - 46.0 % 39.4  MCV Latest Ref Range: 78.0 - 100.0 fL 84.7  MCH Latest Ref Range: 26.0 - 34.0 pg 27.5  MCHC Latest Ref Range: 30.0 - 36.0 g/dL 32.5  RDW Latest Ref Range: 11.5 - 15.5 % 15.1  Platelets Latest Ref Range: 150 - 400 K/uL 354   RADIOGRAPHIC STUDIES I have personally reviewed the radiological images as listed and agreed with the findings in the report. Study Result   CLINICAL DATA:  Colon cancer.  Nausea and vomiting.  EXAM: DG ABDOMEN ACUTE W/ 1V CHEST  COMPARISON:  CT 12/23/2015.  FINDINGS: Frontal chest again shows bilateral pulmonary metastases. Right Port-A-Cath tip overlies the mid SVC. No edema or focal airspace consolidation.  Upright film shows no evidence for intraperitoneal free air. There is no evidence for gaseous bowel dilation to suggest obstruction. Multiple phleboliths project over the anatomic pelvis. Visualized bony anatomy is unremarkable.  IMPRESSION: 1. Multiple bilateral pulmonary metastases. 2. No evidence for bowel perforation or obstruction.   Electronically Signed   By: Misty Stanley M.D.   On: 02/18/2016 13:13   Study Result   INDICATION: 55 year old with metastatic colon cancer. Patient needs additional tissue for molecular testing. EXAM: ULTRASOUND-GUIDED LIVER LESION BIOPSY MEDICATIONS: None. ANESTHESIA/SEDATION: Moderate (conscious) sedation was employed during this procedure. A total of Versed 2.0 mg and Fentanyl 100 mcg was administered intravenously. Moderate Sedation Time: 16 minutes. The patient's level of consciousness and vital signs were monitored continuously by radiology nursing throughout the procedure under my direct supervision. FLUOROSCOPY TIME:  None COMPLICATIONS: None immediate. PROCEDURE: Informed written consent was obtained from the patient after a thorough discussion of the  procedural risks, benefits and alternatives. All questions were addressed. A timeout was performed prior to the initiation of the procedure. Liver was evaluated with ultrasound. The right hepatic lobe was selected for biopsy. The right side of the abdomen was prepped with chlorhexidine and sterile field was created. The skin and soft tissues were anesthetized with 1% lidocaine. Using ultrasound guidance, 17 gauge coaxial needle was directed into the right hepatic lesion. Four core biopsies obtained with an 18 gauge core device. 17 gauge needle was removed without complication. Bandage placed over the puncture site. Ultrasound images were taken and saved for this procedure. FINDINGS: Large irregular hypoechoic lesions scattered throughout the liver. Large portion of the inferior right hepatic lobe appears to be involved with tumor. This area was sampled. IMPRESSION: Ultrasound-guided core biopsies of a right hepatic lesion. Electronically Signed   By: Markus Daft M.D.   On: 02/04/2016 17:45   PATHOLOGY:    ASSESSMENT & PLAN:  Stage IV colorectal cancer with pulmonary and liver metastases Severe nausea and vomiting after FOLFOX therapy Right upper extremity DVT  Headaches Progression on FOLFOX therapy XELIRI/AVASTIN Kras mutation, Nras WT MSI Stable Hypokalemia Rectal Bleed Patient  declined clinical trials Candida Depression, situational Diverticular abscess  Pleasant 55 year old female with stage IV colorectal cancer.  At times the patient seems to be very accepting of her disease and prognosis and other times she is very difficult time addressing it. She has been seen both at [facility] and [facility]. She was eligible for a clinical trial at [facility] but has declined.  I evaluated the patient and she does  have a yeast infection. I wrote her a prescription for Diflucan and nystatin powder.   We will call [facility] and check on the mutation frequency of the BRIP1 mutation to see if  she may qualify for a PARP inhibiitor but certainly, the [facility] trial is a better option. She also can consider trying FOLFIRI.   She would like something for depression, I have written for lexapro.  Follow up with patient in 2 weeks.   All questions were answered. The patient knows to call the clinic with any problems, questions or concerns.    This document serves as a record of services personally performed by Ancil Linsey, MD. It was created on her behalf by [name], a trained medical scribe. The creation of this record is based on the scribe's personal observations and the provider's statements to them. This document has been checked and approved by the attending provider.  I have reviewed the above documentation for accuracy and completeness, and I agree with the above.  Kelby Fam. Whitney Muse, MD

## 2016-03-31 ENCOUNTER — Other Ambulatory Visit (HOSPITAL_COMMUNITY): Payer: Self-pay | Admitting: Hematology & Oncology

## 2016-03-31 ENCOUNTER — Other Ambulatory Visit (HOSPITAL_COMMUNITY): Payer: Self-pay | Admitting: Pharmacist

## 2016-03-31 MED ORDER — OLAPARIB 100 MG PO TABS: 300.0000 mg | ORAL_TABLET | Freq: Two times a day (BID) | ORAL | 3 refills | Status: DC

## 2016-04-07 ENCOUNTER — Other Ambulatory Visit (HOSPITAL_COMMUNITY): Payer: Self-pay | Admitting: Emergency Medicine

## 2016-04-07 ENCOUNTER — Encounter (HOSPITAL_COMMUNITY): Payer: Self-pay | Admitting: Hematology & Oncology

## 2016-04-07 ENCOUNTER — Telehealth (HOSPITAL_COMMUNITY): Payer: Self-pay | Admitting: *Deleted

## 2016-04-07 DIAGNOSIS — C189 Malignant neoplasm of colon, unspecified: Secondary | ICD-10-CM

## 2016-04-07 DIAGNOSIS — C7800 Secondary malignant neoplasm of unspecified lung: Principal | ICD-10-CM

## 2016-04-07 MED ORDER — HYDROCODONE-ACETAMINOPHEN 5-325 MG PO TABS: 1.0000 | ORAL_TABLET | ORAL | 0 refills | Status: DC | PRN

## 2016-04-07 NOTE — Progress Notes (Signed)
Hydrocodone refilled.  

## 2016-04-15 ENCOUNTER — Encounter (HOSPITAL_COMMUNITY)

## 2016-04-15 ENCOUNTER — Ambulatory Visit (HOSPITAL_COMMUNITY): Admitting: Oncology

## 2016-04-22 ENCOUNTER — Observation Stay (HOSPITAL_COMMUNITY)

## 2016-04-22 ENCOUNTER — Encounter (HOSPITAL_COMMUNITY): Attending: Hematology & Oncology | Admitting: Hematology & Oncology

## 2016-04-22 ENCOUNTER — Observation Stay (HOSPITAL_COMMUNITY)
Admission: AD | Admit: 2016-04-22 | Discharge: 2016-04-24 | Disposition: A | Discharge status: Home / Self Care | Source: Ambulatory Visit | Attending: Internal Medicine | Admitting: Internal Medicine

## 2016-04-22 ENCOUNTER — Other Ambulatory Visit (HOSPITAL_COMMUNITY): Payer: Self-pay | Admitting: Pharmacist

## 2016-04-22 ENCOUNTER — Encounter (HOSPITAL_COMMUNITY): Payer: Self-pay | Admitting: Hematology & Oncology

## 2016-04-22 ENCOUNTER — Encounter (HOSPITAL_COMMUNITY): Payer: Self-pay | Admitting: General Practice

## 2016-04-22 VITALS — BP 138/97 | HR 109 | Temp 98.6°F | Resp 20 | Wt 222.6 lb

## 2016-04-22 DIAGNOSIS — R0602 Shortness of breath: Secondary | ICD-10-CM

## 2016-04-22 DIAGNOSIS — C7802 Secondary malignant neoplasm of left lung: Secondary | ICD-10-CM | POA: Diagnosis not present

## 2016-04-22 DIAGNOSIS — C787 Secondary malignant neoplasm of liver and intrahepatic bile duct: Secondary | ICD-10-CM

## 2016-04-22 DIAGNOSIS — R112 Nausea with vomiting, unspecified: Secondary | ICD-10-CM

## 2016-04-22 DIAGNOSIS — G893 Neoplasm related pain (acute) (chronic): Secondary | ICD-10-CM

## 2016-04-22 DIAGNOSIS — C189 Malignant neoplasm of colon, unspecified: Secondary | ICD-10-CM | POA: Diagnosis present

## 2016-04-22 DIAGNOSIS — Z7189 Other specified counseling: Secondary | ICD-10-CM

## 2016-04-22 DIAGNOSIS — C7801 Secondary malignant neoplasm of right lung: Secondary | ICD-10-CM | POA: Insufficient documentation

## 2016-04-22 DIAGNOSIS — I10 Essential (primary) hypertension: Secondary | ICD-10-CM | POA: Diagnosis not present

## 2016-04-22 DIAGNOSIS — Z9119 Patient's noncompliance with other medical treatment and regimen: Secondary | ICD-10-CM

## 2016-04-22 DIAGNOSIS — Z91199 Patient's noncompliance with other medical treatment and regimen due to unspecified reason: Secondary | ICD-10-CM

## 2016-04-22 DIAGNOSIS — F329 Major depressive disorder, single episode, unspecified: Secondary | ICD-10-CM | POA: Diagnosis present

## 2016-04-22 DIAGNOSIS — Z515 Encounter for palliative care: Secondary | ICD-10-CM

## 2016-04-22 DIAGNOSIS — E119 Type 2 diabetes mellitus without complications: Secondary | ICD-10-CM | POA: Diagnosis not present

## 2016-04-22 DIAGNOSIS — C7800 Secondary malignant neoplasm of unspecified lung: Secondary | ICD-10-CM | POA: Diagnosis not present

## 2016-04-22 DIAGNOSIS — F32A Depression, unspecified: Secondary | ICD-10-CM | POA: Diagnosis present

## 2016-04-22 DIAGNOSIS — R1011 Right upper quadrant pain: Secondary | ICD-10-CM | POA: Diagnosis present

## 2016-04-22 DIAGNOSIS — R918 Other nonspecific abnormal finding of lung field: Secondary | ICD-10-CM | POA: Diagnosis present

## 2016-04-22 LAB — COMPREHENSIVE METABOLIC PANEL
ALBUMIN: 2.2 g/dL — AB (ref 3.5–5.0)
ALK PHOS: 643 U/L — AB (ref 38–126)
ALT: 33 U/L (ref 14–54)
ANION GAP: 11 (ref 5–15)
AST: 83 U/L — ABNORMAL HIGH (ref 15–41)
BUN: 5 mg/dL — ABNORMAL LOW (ref 6–20)
CALCIUM: 8.5 mg/dL — AB (ref 8.9–10.3)
CO2: 24 mmol/L (ref 22–32)
Chloride: 98 mmol/L — ABNORMAL LOW (ref 101–111)
Creatinine, Ser: 0.44 mg/dL (ref 0.44–1.00)
GFR calc non Af Amer: >60 mL/min (ref >60)
GLUCOSE: 84 mg/dL (ref 65–99)
POTASSIUM: 3 mmol/L — AB (ref 3.5–5.1)
SODIUM: 133 mmol/L — AB (ref 135–145)
Total Bilirubin: 1.3 mg/dL — ABNORMAL HIGH (ref 0.3–1.2)
Total Protein: 7.3 g/dL (ref 6.5–8.1)

## 2016-04-22 LAB — PROTIME-INR
INR: 1.11
Prothrombin Time: 14.4 seconds (ref 11.4–15.2)

## 2016-04-22 LAB — CBC
HCT: 30.1 % — ABNORMAL LOW (ref 36.0–46.0)
Hemoglobin: 9.4 g/dL — ABNORMAL LOW (ref 12.0–15.0)
MCH: 26.6 pg (ref 26.0–34.0)
MCHC: 31.2 g/dL (ref 30.0–36.0)
MCV: 85 fL (ref 78.0–100.0)
PLATELETS: 541 10*3/uL — AB (ref 150–400)
RBC: 3.54 MIL/uL — ABNORMAL LOW (ref 3.87–5.11)
RDW: 20.7 % — AB (ref 11.5–15.5)
WBC: 20.6 10*3/uL — ABNORMAL HIGH (ref 4.0–10.5)

## 2016-04-22 MED ORDER — OXYCODONE-ACETAMINOPHEN 5-325 MG PO TABS
1.0000 | ORAL_TABLET | ORAL | Status: DC | PRN
  Administered 2016-04-22 – 2016-04-23 (×3): 1 via ORAL
  Filled 2016-04-22 (×3): qty 1

## 2016-04-22 MED ORDER — ONDANSETRON HCL 4 MG/2ML IJ SOLN: 4.0000 mg | Freq: Four times a day (QID) | INTRAMUSCULAR | Status: DC | PRN

## 2016-04-22 MED ORDER — HYDRALAZINE HCL 20 MG/ML IJ SOLN
10.0000 mg | Freq: Four times a day (QID) | INTRAMUSCULAR | Status: DC | PRN
  Administered 2016-04-22 – 2016-04-24 (×2): 10 mg via INTRAVENOUS
  Filled 2016-04-22 (×2): qty 1

## 2016-04-22 MED ORDER — ESCITALOPRAM OXALATE 10 MG PO TABS
10.0000 mg | ORAL_TABLET | Freq: Every day | ORAL | Status: DC
  Administered 2016-04-24: 10 mg via ORAL
  Filled 2016-04-22 (×3): qty 1

## 2016-04-22 MED ORDER — SODIUM CHLORIDE 0.9 % IV SOLN
INTRAVENOUS | Status: AC
  Administered 2016-04-22: 15:00:00 via INTRAVENOUS

## 2016-04-22 MED ORDER — PANTOPRAZOLE SODIUM 40 MG PO TBEC
40.0000 mg | DELAYED_RELEASE_TABLET | Freq: Every day | ORAL | Status: DC
  Administered 2016-04-22 – 2016-04-24 (×3): 40 mg via ORAL
  Filled 2016-04-22 (×3): qty 1

## 2016-04-22 MED ORDER — OXYCODONE HCL 5 MG PO TABS
10.0000 mg | ORAL_TABLET | Freq: Once | ORAL | Status: AC
  Administered 2016-04-22: 10 mg via ORAL
  Filled 2016-04-22: qty 2

## 2016-04-22 MED ORDER — HEPARIN SODIUM (PORCINE) 5000 UNIT/ML IJ SOLN
5000.0000 [IU] | Freq: Three times a day (TID) | INTRAMUSCULAR | Status: DC
  Filled 2016-04-22 (×3): qty 1

## 2016-04-22 MED ORDER — DEXAMETHASONE SODIUM PHOSPHATE 4 MG/ML IJ SOLN
10.0000 mg | INTRAMUSCULAR | Status: DC
Start: 2016-04-22 — End: 2016-04-23
  Administered 2016-04-22: 10 mg via INTRAVENOUS
  Filled 2016-04-22 (×2): qty 3

## 2016-04-22 MED ORDER — LORAZEPAM 0.5 MG PO TABS
0.5000 mg | ORAL_TABLET | Freq: Four times a day (QID) | ORAL | Status: DC | PRN
  Administered 2016-04-23: 0.5 mg via ORAL
  Filled 2016-04-22: qty 1

## 2016-04-22 MED ORDER — HYDROCODONE-ACETAMINOPHEN 5-325 MG PO TABS: 1.0000 | ORAL_TABLET | ORAL | Status: DC | PRN

## 2016-04-22 MED ORDER — HYDROMORPHONE HCL 1 MG/ML IJ SOLN
1.0000 mg | INTRAMUSCULAR | Status: DC | PRN
  Administered 2016-04-24 (×2): 1 mg via INTRAVENOUS
  Filled 2016-04-22 (×2): qty 1

## 2016-04-22 NOTE — H&P (Signed)
TRH H&P   Patient Demographics:    Erica Barker, is a 55 y.o. female  MRN: [id number]   DOB - 05-09-1961  Admit Date - 04/22/2016  Outpatient Primary MD for the patient is No PCP Per Patient  Outpatient Specialists: Dr Whitney Muse   Patient coming from: Home  Chief complaint right upper quadrant abdominal pain.    HPI:    Erica Barker  is a 54 y.o. female, History of stage IV colorectal cancer with metastases to the liver, lungs, patient recommended palliative care but patient refusing currently, hypertension, diet-controlled type 2 diabetes mellitus, morbid obesity, depression, anxiety who had a routine outpatient appointment with her oncologist, during her appointment she mentioned that her right upper quadrant pain was getting worse, her oncologist and called me for a direct admission for pain control.  Patient currently when I walked into the room is lying in bed and reading a magazine, she does not appear to be in any distress, she says that she received some Percocet with good benefit, she denies any headache, no fever or chills, no chest pain or shortness of breath, no diarrhea, no blood in stool or urine. No focal weakness.    Review of systems:    In addition to the HPI above,   No Fever-chills, No Headache, No changes with Vision or hearing, No problems swallowing food or Liquids, No Chest pain, Cough or Shortness of Breath, Nonradiating constant sharp right upper quadrant Abdominal pain ongoing for several weeks, no aggravating or relieving factors, no other associated symptoms, No Nausea or Vommitting, Bowel movements are regular, No Blood in stool or Urine, No dysuria, No new skin rashes or  bruises, No new joints pains-aches,  No new weakness, tingling, numbness in any extremity, No recent weight gain or loss, No polyuria, polydypsia or polyphagia, No significant Mental Stressors.  A full 10 point Review of Systems was done, except as stated above, all other Review of Systems were negative.   With Past History of the following :    Past Medical History:  Diagnosis Date  . Cancer ([place])    stage 4 colon with mets to liver and lung  . Depression   . Diabetes mellitus, type II ([place])   . Headache(784.0)   . Hypertension   . Lymphadenopathy, mediastinal 01/2014   [place]  .  Medically noncompliant 09/06/2015  . Morbid obesity ([place])   . Pulmonary nodules 01/30/14   CTA CHEST      Past Surgical History:  Procedure Laterality Date  . CHOLECYSTECTOMY    . TUBAL LIGATION    . VIDEO BRONCHOSCOPY WITH ENDOBRONCHIAL ULTRASOUND N/A 02/06/2014   Procedure: VIDEO BRONCHOSCOPY WITH ENDOBRONCHIAL ULTRASOUND,bronchial biopsies , node sampling;  Surgeon: Melrose Nakayama, MD;  Location: [facility];  Service: Thoracic;  Laterality: N/A;      Social History:     Social History  Substance Use Topics  . Smoking status: Former Smoker    Packs/day: 0.50    Years: 7.00    Types: Cigarettes    Quit date: 02/03/1989  . Smokeless tobacco: Never Used  . Alcohol use Yes     Comment: occasional         Family History :     Family History  Problem Relation Age of Onset  . Cancer Mother     UTERINE  . Cancer Father     LEUKEMIA  . Crohn's disease Son   . Colon cancer Neg Hx        Home Medications:   Prior to Admission medications   Medication Sig Start Date End Date Taking? Authorizing Provider  acetaminophen (TYLENOL) 500 MG tablet Take 500 mg by mouth every 6 (six) hours as needed for moderate pain.    Historical Provider, MD  dexlansoprazole (DEXILANT) 60 MG capsule Take 1 capsule (60 mg total) by mouth daily. 10/01/15   Baird Cancer, PA-C    escitalopram (LEXAPRO) 20 MG tablet Take half a tab for 5 days then increase to whole tab 03/27/16   Patrici Ranks, MD  HYDROcodone-acetaminophen (NORCO/VICODIN) 5-325 MG tablet Take 1-2 tablets by mouth every 4 (four) hours as needed. 04/07/16   Patrici Ranks, MD  lidocaine-prilocaine (EMLA) cream Apply 1 application topically as needed.    Historical Provider, MD  lisinopril (PRINIVIL,ZESTRIL) 10 MG tablet Take 10 mg by mouth daily.    Historical Provider, MD  LORazepam (ATIVAN) 0.5 MG tablet Take 1 tablet (0.5 mg total) by mouth every 6 (six) hours as needed (nasuea/vomiting). 01/29/16   Baird Cancer, PA-C  Multiple Vitamin (MULTIVITAMIN) capsule Take 1 capsule by mouth daily.    Historical Provider, MD  Nystatin POWD Apply as directed twice daily 03/27/16   Patrici Ranks, MD  Olaparib 100 MG TABS Take 300 mg by mouth 2 (two) times daily. 03/31/16   Patrici Ranks, MD  oxyCODONE-acetaminophen (PERCOCET/ROXICET) 5-325 MG tablet Take 1 tablet by mouth every 4 (four) hours as needed for severe pain. 12/28/15   Patrici Ranks, MD  prochlorperazine (COMPAZINE) 10 MG tablet Take 1 tablet (10 mg total) by mouth 2 (two) times daily as needed for nausea or vomiting. 02/26/16   Isla Pence, MD  promethazine (PHENERGAN) 25 MG suppository Place 1 suppository (25 mg total) rectally every 6 (six) hours as needed. Patient taking differently: Place 25 mg rectally every 6 (six) hours as needed for nausea or vomiting.  02/19/16   Baird Cancer, PA-C  sucralfate (CARAFATE) 1 GM/10ML suspension Take 10 mLs (1 g total) by mouth 4 (four) times daily -  with meals and at bedtime. 11/09/15   Patrici Ranks, MD  traMADol (ULTRAM) 50 MG tablet Take 1 tablet (50 mg total) by mouth every 6 (six) hours as needed for moderate pain. 01/08/16   Baird Cancer, PA-C     Allergies:  Allergies  Allergen Reactions  . Asa [Aspirin] Hives     Physical Exam:   Vitals  Last menstrual period  10/12/2013.   1. General Obese middle-aged African-American female lying in bed in NAD,     2. Normal affect and insight, Not Suicidal or Homicidal, Awake Alert, Oriented X 3.  3. No F.N deficits, ALL C.Nerves Intact, Strength 5/5 all 4 extremities, Sensation intact all 4 extremities, Plantars down going.  4. Ears and Eyes appear Normal, Conjunctivae clear, PERRLA. Moist Oral Mucosa.  5. Supple Neck, No JVD, No cervical lymphadenopathy appriciated, No Carotid Bruits.  6. Symmetrical Chest wall movement, Good air movement bilaterally, CTAB.  7. RRR, No Gallops, Rubs or Murmurs, No Parasternal Heave.  8. Positive Bowel Sounds, Abdomen Soft, minimally tender right upper quadrant palpable liver margin which appears hard,No rebound -guarding or rigidity.  9.  No Cyanosis, Normal Skin Turgor, No Skin Rash or Bruise.  10. Good muscle tone,  joints appear normal , no effusions, Normal ROM.  11. No Palpable Lymph Nodes in Neck or Axillae      Data Review:    CBC No results for input(s): WBC, HGB, HCT, PLT, MCV, MCH, MCHC, RDW, LYMPHSABS, MONOABS, EOSABS, BASOSABS, BANDABS in the last 168 hours.  Invalid input(s): NEUTRABS, BANDSABD ------------------------------------------------------------------------------------------------------------------  Chemistries  No results for input(s): NA, K, CL, CO2, GLUCOSE, BUN, CREATININE, CALCIUM, MG, AST, ALT, ALKPHOS, BILITOT in the last 168 hours.  Invalid input(s): GFRCGP ------------------------------------------------------------------------------------------------------------------ CrCl cannot be calculated (Patient's most recent lab result is older than the maximum 21 days allowed.). ------------------------------------------------------------------------------------------------------------------ No results for input(s): TSH, T4TOTAL, T3FREE, THYROIDAB in the last 72 hours.  Invalid input(s): FREET3  Coagulation profile No results for  input(s): INR, PROTIME in the last 168 hours. ------------------------------------------------------------------------------------------------------------------- No results for input(s): DDIMER in the last 72 hours. -------------------------------------------------------------------------------------------------------------------  Cardiac Enzymes No results for input(s): CKMB, TROPONINI, MYOGLOBIN in the last 168 hours.  Invalid input(s): CK ------------------------------------------------------------------------------------------------------------------ No results found for: BNP   ---------------------------------------------------------------------------------------------------------------  Urinalysis    Component Value Date/Time   COLORURINE YELLOW 02/26/2016 1938   APPEARANCEUR CLEAR 02/26/2016 1938   LABSPEC 1.010 02/26/2016 1938   PHURINE 5.5 02/26/2016 1938   GLUCOSEU NEGATIVE 02/26/2016 1938   HGBUR TRACE (A) 02/26/2016 1938   BILIRUBINUR NEGATIVE 02/26/2016 1938   KETONESUR NEGATIVE 02/26/2016 1938   PROTEINUR NEGATIVE 02/26/2016 1938   UROBILINOGEN 0.2 05/15/2015 0928   NITRITE NEGATIVE 02/26/2016 1938   LEUKOCYTESUR NEGATIVE 02/26/2016 1938    ----------------------------------------------------------------------------------------------------------------   Imaging Results:    No results found.      Assessment & Plan:     1. Right upper quadrant abdominal pain due to colorectal cancer with metastases to the liver and lungs, this is due to capsular stretching. Patient feels much better with Percocet, she will be placed on IV Decadron to reduce any inflammatory component, as needed every 4 hours Percocet and every 2 hours IV Dilaudid for breakthrough , she appears to be in no distress, will obtain basic labs which will include CBC, CMP, INR and a UA. Will obtain chest x-ray along with abdominal x-ray. Abdominal exam does not reveal acute abdomen.  2. Stage IV  breast cancer with metastases to the liver and lungs. Discussed her case with her oncologist Dr. Whitney Muse to commence palliative care, patient for now was to be full code, palliative care called for goals of care. Per Dr. Whitney Muse patient is in somewhat of a denial.  3. Hypertension. For now as needed IV hydralazine.  4. Anxiety depression. Placed on home dose Xanax and Lexapro.   DVT Prophylaxis Lovenox   AM Labs Ordered, also please review Full Orders  Family Communication: Admission, patients condition and plan of care including tests being ordered have been discussed with the patient and mother who indicate understanding and agree with the plan and Code Status.  Code Status full  Likely DC to  home in 1-2 days  Condition GUARDED    Consults called: Oncology, palliative care    Admission status: Observation    Time spent in minutes : 30   SINGH,PRASHANT K M.D on 04/22/2016 at 1:38 PM  Between 7am to 7pm - Pager - [phone]. After 7pm go to [email or web address] - password [facility]  [facility] - Office  [phone]

## 2016-04-22 NOTE — Progress Notes (Signed)
[name] Progress Note  Patient Care Team: No Pcp Per Patient as PCP - General ([place]) Melrose Nakayama, MD as Consulting Physician (Cardiothoracic Surgery)  CHIEF COMPLAINTS/PURPOSE OF CONSULTATION:  Stage IV CRC CT of abdomen and pelvis on 02/20/2014 at [facility] showing 3.6 cm apple core lesion in the proximal sigmoid colon 11 mm hypoenhancing lesion in the medial left hepatic dome, 4.1 cm right lower lobe mass and adjacent right lower lobe nodule CEA on 08/18/2014 of 93.7 ng/ml Right upper extremity DVT 04/17/2014 paired brachial veins, axillary vein, and peripheral aspect of the right subclavian vein CT chest 07/06/2014 with bilateral pulmonary nodules and masses, RUL, lateral RUL, posterior LUL, lobulated nodule in the subpleural RLL,     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 ([place])   02/20/2014 Imaging    CT of abdomen and pelvis on 02/20/2014 at [facility] showing 3.6 cm apple core lesion in the proximal sigmoid colon 11 mm hypoenhancing lesion in the medial left hepatic dome, 4.1 cm right lower lobe mass and adjacent right lower lobe nodule      02/23/2014 Pathology Results    KRAS MUTATED      04/17/2014 Imaging    Right upper extremity DVT 04/17/2014 paired brachial veins, axillary vein, and peripheral aspect of the right subclavian vein      07/06/2014 Imaging    CT chest 07/06/2014 with bilateral pulmonary nodules and masses, RUL, lateral RUL, posterior LUL, lobulated nodule in the subpleural RLL      08/18/2014 Tumor Marker    CEA 93.7 ng/ml      10/09/2014 Initial Diagnosis    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      02/19/2015 Imaging    Progressive pulmonary metastatic disease as detailed above. Stable mediastinal lymphadenopathy. No abdominal/pelvic metastatic  disease is identified      04/24/2015 - 08/07/2015 Chemotherapy    XELIRI started on 04/24/2015. Patient did not want to wear 5-FU pump.  On 2000 mg BID 14 days on and 7 days off.      04/28/2015 Adverse Reaction    Mild stomatitis.  Xeloda held.  Treated with Magic Mouthwash.  Resolved by 05/02/2015 at office visit.      05/15/2015 Treatment Plan Change    Will restart Xeloda at 1500 mg BID 14 days on and 7 days off (11/1)      07/17/2015 Adverse Reaction    Mouth sores      07/17/2015 Treatment Plan Change    Change in Xeloda frequency: 1500 mg BID 7 days on and 7 days off.      07/24/2015 Adverse Reaction    Increased nausea/vomiting      08/07/2015 Treatment Plan Change    Irinotecan dose reduced by 10%      08/10/2015 Imaging    MRI brain- Scattered punctate subcortical T2 hyperintensities bilaterally are slightly greater than expected for age. The finding is nonspecific but can be seen in the setting of chronic microvascular ischemia, a demyelinating process such as multiple...      09/03/2015 Progression    CT CAP- demonstrates new hepatic lesions      09/03/2015 Imaging    CT CAP- Multiple new hepatic metastasis in the R hepatic lobe corresponds to subtle hypodensities identified on comparison exam.  Stable  metastatic bilateral pulmonary nodules and masses. Small mesenteric lymph nodes along the sigmoid colon are unchanged      09/24/2015 -  Chemotherapy    Stivarga 160 mg daily 21/28 days      12/23/2015 - 12/25/2015 Hospital Admission    Rectal Bleeding, RUQ pain      12/24/2015 Imaging    CT chest- Significant progression of multiple B/L pulmonary metastases. Mild progression of mediastinal lymphadenopathy. Significant progression of multiple hepatic metastases. Mass in the sigmoid colon, increasing in prominence, concerning for progressio      12/24/2015 Imaging    CT head- No acute intracranial abnormalities. No significant mass effect or focal lesion  identified.      12/24/2015 Progression    CT scans demonstrate progression of disease.      12/31/2015 Miscellaneous    Consult at [facility] for consideration of clinical trial.  Seen by Dr. Teryl Lucy.      02/04/2016 Procedure    US guided core biopsy of right hepatic lobe lesion by Dr. Anselm Pancoast.  4 cores obtained.      02/05/2016 Pathology Results    Liver, needle/core biopsy, Right Hepatic Lobe - METASTATIC ADENOCARCINOMA, CONSISTENT WITH COLONIC PRIMARY.  Sent for [facility] testing.      03/30/2016 Miscellaneous    [facility] consultation        HISTORY OF PRESENTING ILLNESS:   Erica Barker 55 y.o. female is here because of stage IV colon cancer. Unfortunately is kras mutated and MSI stable. She was seen in consultation at [facility] for clinical trial was a candidate but declined. Currently we are trying to obtain a PARP inhibitor, but are having difficulty. [place] however continues to decline and continues to refuse hospice intervention.   Patient is accompanied by her mother and daughter. She is laying on the bed due to severe pain. Her weight has been stable since 03/27/16. She notes she does not eat well. However, she cannot sleep at night due to anxiety and pain, and she only sleeps for short periods during the day. Dorita's daughter states her mother could not tolerate Lexapro, because it altered her personality. She is tearful.   Patient reports that her bowels are normal and she can still shower. Her fatigue and pain is exacerbated upon eating. Phyliss's daughter says her mother has been experiencing this debilitative pain for the last week.  Chellsie says that she is not afraid of death, but she does not like experiencing pain all the time. She does not want hospice.   No vomiting. No BRBPR. Significant RUQ pain.    MEDICAL HISTORY:  Past Medical History:  Diagnosis Date  . Cancer ([place])    stage 4 colon with mets to liver and lung  . Depression   .  Diabetes mellitus, type II ([place])   . Headache(784.0)   . Hypertension   . Lymphadenopathy, mediastinal 01/2014   [place]  . Medically noncompliant 09/06/2015  . Morbid obesity ([place])   . Pulmonary nodules 01/30/14   CTA CHEST    SURGICAL HISTORY: Past Surgical History:  Procedure Laterality Date  . CHOLECYSTECTOMY    . TUBAL LIGATION    . VIDEO BRONCHOSCOPY WITH ENDOBRONCHIAL ULTRASOUND N/A 02/06/2014   Procedure: VIDEO BRONCHOSCOPY WITH ENDOBRONCHIAL ULTRASOUND,bronchial biopsies , node sampling;  Surgeon: Melrose Nakayama, MD;  Location: [facility];  Service: Thoracic;  Laterality: N/A;    SOCIAL HISTORY: Social History   Social History  . Marital status: Widowed  Spouse name: N/A  . Number of children: N/A  . Years of education: N/A   Occupational History  . Not on file.   Social History Main Topics  . Smoking status: Former Smoker    Packs/day: 0.50    Years: 7.00    Types: Cigarettes    Quit date: 02/03/1989  . Smokeless tobacco: Never Used  . Alcohol use Yes     Comment: occasional  . Drug use: No  . Sexual activity: Not on file   Other Topics Concern  . Not on file   Social History Narrative  . No narrative on file  She is in a relationship. 3 children. Aged 32,35,20.  She has no grandchildren. She smoked in high school but quit after graduation. No ETOH. 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  She is originally from [place].  FAMILY HISTORY: Family History  Problem Relation Age of Onset  . Cancer Mother     UTERINE  . Cancer Father     LEUKEMIA  . Crohn's disease Son   . Colon cancer Neg Hx    indicated that her mother is alive. She indicated that her father is deceased. She indicated that her son is alive. She indicated that the status of her neg hx is unknown.    Father is deceased at age 44 from leukemia, mother is alive at 24 with a history of uterine cancer.  She has one sister how  is healthy and one brother who is healthy. He works as a Engineer, structural.   ALLERGIES:  is allergic to asa [aspirin].  MEDICATIONS:  No current facility-administered medications for this visit.    No current outpatient prescriptions on file.   Facility-Administered Medications Ordered in Other Visits  Medication Dose Route Frequency Provider Last Rate Last Dose  . 0.9 %  sodium chloride infusion   Intravenous Continuous Thurnell Lose, MD 75 mL/hr at 04/22/16 1456    . dexamethasone (DECADRON) tablet 4 mg  4 mg Oral Q12H Mutaz Elmahi, MD      . escitalopram (LEXAPRO) tablet 10 mg  10 mg Oral Daily Thurnell Lose, MD      . heparin injection 5,000 Units  5,000 Units Subcutaneous Q8H Thurnell Lose, MD      . hydrALAZINE (APRESOLINE) injection 10 mg  10 mg Intravenous Q6H PRN Thurnell Lose, MD   10 mg at 04/22/16 1457  . HYDROmorphone (DILAUDID) injection 1 mg  1 mg Intravenous Q2H PRN Thurnell Lose, MD      . LORazepam (ATIVAN) tablet 0.5 mg  0.5 mg Oral Q6H PRN Thurnell Lose, MD      . ondansetron (ZOFRAN) injection 4 mg  4 mg Intravenous Q6H PRN Thurnell Lose, MD      . oxyCODONE (Oxy IR/ROXICODONE) immediate release tablet 5-10 mg  5-10 mg Oral Q3H PRN Verlee Monte, MD      . oxyCODONE (OXYCONTIN) 12 hr tablet 15 mg  15 mg Oral Q12H Mutaz Elmahi, MD      . pantoprazole (PROTONIX) EC tablet 40 mg  40 mg Oral Daily Thurnell Lose, MD   40 mg at 04/23/16 0903  . potassium chloride SA (K-DUR,KLOR-CON) CR tablet 40 mEq  40 mEq Oral Q6H Verlee Monte, MD   40 mEq at 04/23/16 2952    Review of Systems  Constitutional: Positive for fatigue and pain. Negative for fever, chills, and weight  loss.  HENT:  Negative for congestion, hearing loss, nosebleeds, sore throat and tinnitus.   Eyes: Negative for blurred vision, double vision, pain and discharge.  Respiratory: Negative for cough, hemoptysis, sputum production, shortness of breath and wheezing.   Cardiovascular: Negative for  chest pain, palpitations, claudication, leg swelling and PND.  Gastrointestinal:  Negative for nausea, vomiting, abdominal pain, diarrhea, constipation, heartburn, blood in stool and melena.  Genitourinary: Negative for dysuria, urgency, frequency and hematuria.  Musculoskeletal: Negative for myalgias, joint pain and falls.  Skin:  Negative for rash Neurological: Negative for dizziness, tingling, tremors, sensory change, speech change, focal weakness, seizures, loss of consciousness, weakness. Endo/Heme/Allergies: Does not bruise/bleed easily.  Psychiatric/Behavioral: Negative for depression, suicidal ideas, memory loss and substance abuse. The patient is not nervous/anxious and does not have insomnia.   14 point review of systems was performed and is negative except as detailed under history of present illness and above  PHYSICAL EXAMINATION: ECOG PERFORMANCE STATUS: 2 - Symptomatic, <50% confined to bed  Vitals:   04/22/16 0928  BP: (!) 138/97  Pulse: (!) 109  Resp: 20  Temp: 98.6 F (37 C)   Filed Weights   04/22/16 0928  Weight: 222 lb 9.6 oz (101 kg)    Physical Exam  Constitutional: She is oriented to person, place, and time and well-developed, uncomfortable Patient laying in bed in intractable pain  Head: Normocephalic and atraumatic.  Nose: Nose normal.  Mouth/Throat: Oropharynx is clear and moist. No oropharyngeal exudate.  Eyes: Conjunctivae and EOM are normal. Pupils are equal, round, and reactive to light. Right eye exhibits no discharge. Left eye exhibits no discharge. No scleral icterus.  Neck: Normal range of motion. Neck supple. No tracheal deviation present. No thyromegaly present.  Cardiovascular: Normal rate, regular rhythm and normal heart sounds.  Exam reveals no gallop and no friction rub.  No murmur heard. Pulmonary/Chest: Effort normal and breath sounds normal. She has no wheezes. She has no rales.  Abdominal: Enlarged liver. Soft. Bowel sounds are normal.  There is no tenderness. There is no rebound and no guarding.  Musculoskeletal: Normal range of motion. She exhibits no edema.  Lymphadenopathy:  She has no cervical adenopathy.  Neurological: She is alert and oriented to person, place, and time. No cranial nerve deficit. Coordination normal.  Skin: P Skin is warm and dry. No rash noted.  Psychiatric: tearful, denial Nursing note and vitals reviewed.   LABORATORY DATA:  I have reviewed the data as listed.  Results for ZURIAH, BORDAS (MRN [id number]) as of 03/27/2016 08:40  Ref. Range 02/26/2016 15:54  Sodium Latest Ref Range: 135 - 145 mmol/L 130 (L)  Potassium Latest Ref Range: 3.5 - 5.1 mmol/L 3.1 (L)  Chloride Latest Ref Range: 101 - 111 mmol/L 96 (L)  CO2 Latest Ref Range: 22 - 32 mmol/L 25  BUN Latest Ref Range: 6 - 20 mg/dL 5 (L)  Creatinine Latest Ref Range: 0.44 - 1.00 mg/dL 0.64  Calcium Latest Ref Range: 8.9 - 10.3 mg/dL 9.0  EGFR (Non-African Amer.) Latest Ref Range: >60 mL/min >60  EGFR (African American) Latest Ref Range: >60 mL/min >60  Glucose Latest Ref Range: 65 - 99 mg/dL 201 (H)  Anion gap Latest Ref Range: 5 - 15  9  Alkaline Phosphatase Latest Ref Range: 38 - 126 U/L 219 (H)  Albumin Latest Ref Range: 3.5 - 5.0 g/dL 3.4 (L)  Lipase Latest Ref Range: 11 - 51 U/L 20  AST Latest Ref Range: 15 - 41  U/L 60 (H)  ALT Latest Ref Range: 14 - 54 U/L 56 (H)  Total Protein Latest Ref Range: 6.5 - 8.1 g/dL 8.5 (H)  Total Bilirubin Latest Ref Range: 0.3 - 1.2 mg/dL 0.4  WBC Latest Ref Range: 4.0 - 10.5 K/uL 15.7 (H)  RBC Latest Ref Range: 3.87 - 5.11 MIL/uL 4.65  Hemoglobin Latest Ref Range: 12.0 - 15.0 g/dL 12.8  HCT Latest Ref Range: 36.0 - 46.0 % 39.4  MCV Latest Ref Range: 78.0 - 100.0 fL 84.7  MCH Latest Ref Range: 26.0 - 34.0 pg 27.5  MCHC Latest Ref Range: 30.0 - 36.0 g/dL 32.5  RDW Latest Ref Range: 11.5 - 15.5 % 15.1  Platelets Latest Ref Range: 150 - 400 K/uL 354   RADIOGRAPHIC STUDIES I have personally  reviewed the radiological images as listed and agreed with the findings in the report. Study Result   CLINICAL DATA:  Colon cancer.  Nausea and vomiting.  EXAM: DG ABDOMEN ACUTE W/ 1V CHEST  COMPARISON:  CT 12/23/2015.  FINDINGS: Frontal chest again shows bilateral pulmonary metastases. Right Port-A-Cath tip overlies the mid SVC. No edema or focal airspace consolidation.  Upright film shows no evidence for intraperitoneal free air. There is no evidence for gaseous bowel dilation to suggest obstruction. Multiple phleboliths project over the anatomic pelvis. Visualized bony anatomy is unremarkable.  IMPRESSION: 1. Multiple bilateral pulmonary metastases. 2. No evidence for bowel perforation or obstruction.   Electronically Signed   By: Misty Stanley M.D.   On: 02/18/2016 13:13   Study Result   INDICATION: 55 year old with metastatic colon cancer. Patient needs additional tissue for molecular testing. EXAM: ULTRASOUND-GUIDED LIVER LESION BIOPSY MEDICATIONS: None. ANESTHESIA/SEDATION: Moderate (conscious) sedation was employed during this procedure. A total of Versed 2.0 mg and Fentanyl 100 mcg was administered intravenously. Moderate Sedation Time: 16 minutes. The patient's level of consciousness and vital signs were monitored continuously by radiology nursing throughout the procedure under my direct supervision. FLUOROSCOPY TIME:  None COMPLICATIONS: None immediate. PROCEDURE: Informed written consent was obtained from the patient after a thorough discussion of the procedural risks, benefits and alternatives. All questions were addressed. A timeout was performed prior to the initiation of the procedure. Liver was evaluated with ultrasound. The right hepatic lobe was selected for biopsy. The right side of the abdomen was prepped with chlorhexidine and sterile field was created. The skin and soft tissues were anesthetized with 1% lidocaine. Using  ultrasound guidance, 17 gauge coaxial needle was directed into the right hepatic lesion. Four core biopsies obtained with an 18 gauge core device. 17 gauge needle was removed without complication. Bandage placed over the puncture site. Ultrasound images were taken and saved for this procedure. FINDINGS: Large irregular hypoechoic lesions scattered throughout the liver. Large portion of the inferior right hepatic lobe appears to be involved with tumor. This area was sampled. IMPRESSION: Ultrasound-guided core biopsies of a right hepatic lesion. Electronically Signed   By: Markus Daft M.D.   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  She has seen Dr. Teryl Lucy at [facility] on 12/31/2015.  Re-biopsy of hepatic lesion took place on 02/04/2016 with pathology reporting metastatic adenocarcinoma of GI primary.  This specimen was sent for [facility] testing. She was not eligible for trials at [facility]. Patient was then seen at [facility] [place] and eligible for clinical trial, she declined. BRIP1 mutation was noted, [facility] was contacted and a high mutation frequency was noted.   Oncology history is updated.  We are currently not having luck obtaining a parp inhibitor based upon BRIP1 mutation status, patient is realistically too sick. In addition, little is known about response concerning these drugs in her current  disease.    Cancer related pain Arika has been in pain for several weeks and has not called nor presented to the ED. She has oral short acting pain medication which she does take. She is afraid to discuss the progression of her pain, she notes that this means her cancer is worse.  I have recommended admission for intractable pain and pain control. She is tearful and unable to sit up from the exam table during out visit today. She is agreeable to admission.   Counseling regarding end of life decision making We have discussed the terminal nature of her disease at multiple visits in the past however this is an ongoing difficult issue for her. WE addressed hospice today, she worries that it will speed up her dying process. I have emphasized to her that hospice is about relieving physical, emotional suffering. It does not hasten death. I expressed to her that given her current state she is not well enough for therapy. That with adequate pain control, symptoms management she will have better quality with family.  Will have palliative care also work with patient as I have been unable to get her to accept the terminal nature of her disease and death/dying.   All questions were answered. The patient knows to call the clinic with any problems, questions or concerns.    This document serves as a record of services personally performed by Ancil Linsey, MD. It was created on her behalf by [name], a trained medical scribe. The creation of this record is based on the scribe's personal observations and the provider's statements to them. This document has been checked and approved by the attending provider.  I have reviewed the above documentation for accuracy and completeness, and I agree with the above.  Kelby Fam. Whitney Muse, MD

## 2016-04-22 NOTE — Progress Notes (Signed)
Report called to 3rd floor nurse, Margreta Journey.

## 2016-04-22 NOTE — Patient Instructions (Signed)
[facility] at [facility] Discharge Instructions  RECOMMENDATIONS MADE BY THE CONSULTANT AND ANY TEST RESULTS WILL BE SENT TO YOUR REFERRING PHYSICIAN.  You saw Dr. Penland today.  Thank you for choosing [facility] at [facility] to provide your oncology and hematology care.  To afford each patient quality time with our provider, please arrive at least 15 minutes before your scheduled appointment time.   Beginning January 23rd 2017 lab work for the Cancer Center will be done in the  Main lab at [facility] on 1st floor. If you have a lab appointment with the Cancer Center please come in thru the  Main Entrance and check in at the main information desk  You need to re-schedule your appointment should you arrive 10 or more minutes late.  We strive to give you quality time with our providers, and arriving late affects you and other patients whose appointments are after yours.  Also, if you no show three or more times for appointments you may be dismissed from the clinic at the providers discretion.     Again, thank you for choosing [facility].  Our hope is that these requests will decrease the amount of time that you wait before being seen by our physicians.       _____________________________________________________________  Should you have questions after your visit to [facility], please contact our office at [phone] between the hours of 8:30 a.m. and 4:30 p.m.  Voicemails left after 4:30 p.m. will not be returned until the following business day.  For prescription refill requests, have your pharmacy contact our office.         Resources For Cancer Patients and their Caregivers ? American Cancer Society: Can assist with transportation, wigs, general needs, runs Look Good Feel Better.        1-888-227-6333 ? Cancer Care: Provides financial assistance, online support groups, medication/co-pay assistance.   1-800-813-HOPE (4673) ? [facility] Assists [place] cancer patients and their families through emotional , educational and financial support.  336-427-4357 ? [place] DSS Where to apply for food stamps, Medicaid and utility assistance. 336-342-1394 ? [facility]: Transportation to medical appointments. 336-347-2287 ? Social Security Administration: May apply for disability if have a Stage IV cancer. 336-342-7796 1-800-772-1213 ? [place] Aging, Disability and Transit Services: Assists with nutrition, care and transit needs. 336-349-2343  Cancer Center Support Programs: @10RELATIVEDAYS@ > Cancer Support Group  2nd Tuesday of the month 1pm-2pm, Journey Room  > Creative Journey  3rd Tuesday of the month 1130am-1pm, Journey Room  > Look Good Feel Better  1st Wednesday of the month 10am-12 noon, Journey Room (Call American Cancer Society to register 1-800-395-5775)    

## 2016-04-23 ENCOUNTER — Encounter (HOSPITAL_COMMUNITY): Payer: Self-pay | Admitting: Hematology & Oncology

## 2016-04-23 ENCOUNTER — Encounter (HOSPITAL_COMMUNITY): Payer: Self-pay | Admitting: Primary Care

## 2016-04-23 DIAGNOSIS — R0602 Shortness of breath: Secondary | ICD-10-CM | POA: Diagnosis not present

## 2016-04-23 DIAGNOSIS — Z515 Encounter for palliative care: Secondary | ICD-10-CM

## 2016-04-23 DIAGNOSIS — R918 Other nonspecific abnormal finding of lung field: Secondary | ICD-10-CM

## 2016-04-23 DIAGNOSIS — R1011 Right upper quadrant pain: Secondary | ICD-10-CM

## 2016-04-23 DIAGNOSIS — G893 Neoplasm related pain (acute) (chronic): Secondary | ICD-10-CM | POA: Insufficient documentation

## 2016-04-23 DIAGNOSIS — I10 Essential (primary) hypertension: Secondary | ICD-10-CM | POA: Diagnosis not present

## 2016-04-23 DIAGNOSIS — Z9119 Patient's noncompliance with other medical treatment and regimen: Secondary | ICD-10-CM | POA: Diagnosis not present

## 2016-04-23 DIAGNOSIS — C189 Malignant neoplasm of colon, unspecified: Secondary | ICD-10-CM | POA: Diagnosis not present

## 2016-04-23 DIAGNOSIS — Z7189 Other specified counseling: Secondary | ICD-10-CM

## 2016-04-23 DIAGNOSIS — C787 Secondary malignant neoplasm of liver and intrahepatic bile duct: Secondary | ICD-10-CM | POA: Diagnosis not present

## 2016-04-23 MED ORDER — SENNA 8.6 MG PO TABS
2.0000 | ORAL_TABLET | Freq: Every day | ORAL | Status: DC
  Administered 2016-04-23 – 2016-04-24 (×2): 17.2 mg via ORAL
  Filled 2016-04-23 (×2): qty 2

## 2016-04-23 MED ORDER — POLYETHYLENE GLYCOL 3350 17 G PO PACK
17.0000 g | PACK | Freq: Every day | ORAL | Status: DC
  Administered 2016-04-23 – 2016-04-24 (×2): 17 g via ORAL
  Filled 2016-04-23 (×2): qty 1

## 2016-04-23 MED ORDER — OXYCODONE HCL 5 MG PO TABS: 5.0000 mg | ORAL_TABLET | ORAL | Status: DC | PRN

## 2016-04-23 MED ORDER — MAGNESIUM SULFATE 2 GM/50ML IV SOLN
2.0000 g | Freq: Once | INTRAVENOUS | Status: AC
  Administered 2016-04-23: 2 g via INTRAVENOUS
  Filled 2016-04-23: qty 50

## 2016-04-23 MED ORDER — POTASSIUM CHLORIDE CRYS ER 20 MEQ PO TBCR
40.0000 meq | EXTENDED_RELEASE_TABLET | Freq: Four times a day (QID) | ORAL | Status: AC
  Administered 2016-04-23 (×3): 40 meq via ORAL
  Filled 2016-04-23 (×3): qty 2

## 2016-04-23 MED ORDER — OXYCODONE HCL ER 15 MG PO T12A
15.0000 mg | EXTENDED_RELEASE_TABLET | Freq: Two times a day (BID) | ORAL | Status: DC
  Administered 2016-04-23 – 2016-04-24 (×2): 15 mg via ORAL
  Filled 2016-04-23 (×3): qty 1

## 2016-04-23 MED ORDER — DEXAMETHASONE 4 MG PO TABS
4.0000 mg | ORAL_TABLET | Freq: Two times a day (BID) | ORAL | Status: DC
  Administered 2016-04-23 – 2016-04-24 (×3): 4 mg via ORAL
  Filled 2016-04-23 (×6): qty 1

## 2016-04-23 NOTE — Assessment & Plan Note (Signed)
Erica Barker has been in pain for several weeks and has not called nor presented to the ED. She has oral short acting pain medication which she does take. She is afraid to discuss the progression of her pain, she notes that this means her cancer is worse.  I have recommended admission for intractable pain and pain control. She is tearful and unable to sit up from the exam table during out visit today. She is agreeable to admission.

## 2016-04-23 NOTE — Assessment & Plan Note (Signed)
We have discussed the terminal nature of her disease at multiple visits in the past however this is an ongoing difficult issue for her. WE addressed hospice today, she worries that it will speed up her dying process. I have emphasized to her that hospice is about relieving physical, emotional suffering. It does not hasten death. I expressed to her that given her current state she is not well enough for therapy. That with adequate pain control, symptoms management she will have better quality with family.  Will have palliative care also work with patient as I have been unable to get her to accept the terminal nature of her disease and death/dying.

## 2016-04-23 NOTE — Consult Note (Signed)
Consultation Note Date: 04/23/2016   Patient Name: Erica Barker  DOB: 03-21-1961  MRN: [id number]  Age / Sex: 55 y.o., female  PCP: No Pcp Per Patient Referring Physician: Verlee Monte, MD  Reason for Consultation: Establishing goals of care, Hospice Evaluation and Psychosocial/spiritual support  HPI/Patient Profile: 55 y.o. 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   Clinical Assessment and Goals of Care: Erica Barker is resting quietly in bed. She makes eye contact and greets me as I enter. She tells me that she was 1st diagnosed with cancer in July 2015. Several rounds of chemo were given every 2 to 3 weeks, and she was doing well. She shares that she then had recurrence, and more chemo appeals. She states that in June of this share she was offered the possibility of clinical trial at [facility], but that she did not qualify due to genetics of her cancer. She states that 6 weeks after stopping the medication (in preparation for the [facility] testing) she noticed the market decrease in energy, and an increase in her symptoms. We talk about home life, and ADLs. She states that she is able to get groceries and cook food, her daughter helps her. She states that she is recently qualify for CAP Aid through Medicaid.  She states that she has lost weight over the last year, she was "healthy" 250 pounds, and has now lost to about 222 pounds.  We talk about healthcare power of attorney (see below). We also talk about advanced directives. She states that once she found out that there was "no cure" she and her family talked about end-of-life. She states that they have discussed what kind of funeral service she would like, and she wants to be cremated. She talks about her support team, and  states that she knows she will have help when needed. She states that her mother has come from [place] to help in her care. She talks about her desire to have quality of life, to be able to do simple things such as have the energy to make her own bed.  I share a diagram of the chronic illness trajectory, and that it is normal for her to become weaker over time. I share the the expected symptoms as she comes closer to end-of-life, such as increased pain and nausea, decreased energy. I ask if she and her daughter have talked about what happens before her passing. She shares that her goal is to pass at home. We talk in detail about the realities of CPR, chest compressions, intubation/ventilation. She is considering these options. I share my concern that this would be painful for her, and not change outcomes. We discussed the concepts of allow a natural death. I share in detail the benefits of hospice in home. The services provided including RN, CNA, SW, and chaplain. I share that hospice workers will come smiling, their goal is to make her  life the best it can be. I share that they are experts and symptom management, and will stand by her during this difficult time.  I will revisit Erica Barker 10/12 to discuss code status and her decisions about hospice in home.  Healthcare power of attorney NEXT OF KIN - Erica Barker states she would like for her daughter, Erica Barker, to be her medical decision maker. No formal Paperwork completed. She states that she has 2 sons also, but prefers her daughter to make health care decisions.   SUMMARY OF RECOMMENDATIONS   symptom management to continue by Erica Barker. We discussed the use of Decadron to improve her symptoms, but, there may be a time as she nears end of life, that this medication will stop.  Code Status/Advance Care Planning:  Full code - we discuss the realities of CPR and intubation. We talk about the concepts of allow a natural death. Erica Barker  states that she and her family have discussed this and their goal is to attempt resuscitation. We talk about a time trial, how many days she would want intubation.  I share with her my worry about intubation and CPR, that she would, if she survives, still have stage IV cancer, and would experience functional decline.  Symptom Management:   per hospitalist  I encouraged her to take the PRN Ativan at bedtime tonight, let me know if she sleeps better, if so, I will schedule this medication at night.   We talk about pain management regimen, she states that the current regimen has helped her.  I share that the use of Decadron will help with symptom management.  Palliative Prophylaxis:   Bowel Regimen and Frequent Pain Assessment  Additional Recommendations (Limitations, Scope, Preferences):  At this point, continue to treat the treatable. Erica Barker is aware that there is no further chemotherapy being offered.  Psycho-social/Spiritual:   Desire for further Chaplaincy support:no  Additional Recommendations: Caregiving  Support/Resources and Education on Hospice  Prognosis:   Unable to determine, I ask Erica Barker if she is interested in knowing her prognosis. She states that she is. I share that I will discuss prognosis with Erica Barker tomorrow, and return to share this information with her. I share that it would be unfair if she thinks she has 2 years, and we think she only has months. She agrees.   Discharge Planning: We discussed the benefits of hospice in home in detail, she has not made a decision to accept hospice at this time. We will talk more about the benefits of hospice tomorrow.      Primary Diagnoses: Present on Admission: . Colon cancer metastasized to liver ([place]) . Depression . Hypertension . Pulmonary nodules . Obesity, morbid ([place]) . RUQ pain   I have reviewed the medical record, interviewed the patient and family, and examined the patient. The following  aspects are pertinent.  Past Medical History:  Diagnosis Date  . Cancer ([place])    stage 4 colon with mets to liver and lung  . Depression   . Diabetes mellitus, type II ([place])   . Headache(784.0)   . Hypertension   . Lymphadenopathy, mediastinal 01/2014   CTA ANGIO .[place]  . Medically noncompliant 09/06/2015  . Morbid obesity ([place])   . Pulmonary nodules 01/30/14   CTA CHEST   Social History   Social History  . Marital status: Widowed    Spouse name: N/A  . Number of children: N/A  . Years of education: N/A  Social History Main Topics  . Smoking status: Former Smoker    Packs/day: 0.50    Years: 7.00    Types: Cigarettes    Quit date: 02/03/1989  . Smokeless tobacco: Never Used  . Alcohol use Yes     Comment: occasional  . Drug use: No  . Sexual activity: Not Asked   Other Topics Concern  . None   Social History Narrative  . None   Family History  Problem Relation Age of Onset  . Cancer Mother     UTERINE  . Cancer Father     LEUKEMIA  . Crohn's disease Son   . Colon cancer Neg Hx    Scheduled Meds: . dexamethasone  4 mg Oral Q12H  . escitalopram  10 mg Oral Daily  . heparin subcutaneous  5,000 Units Subcutaneous Q8H  . oxyCODONE  15 mg Oral Q12H  . pantoprazole  40 mg Oral Daily  . polyethylene glycol  17 g Oral Daily  . potassium chloride  40 mEq Oral Q6H  . senna  2 tablet Oral Daily   Continuous Infusions:  PRN Meds:.hydrALAZINE, HYDROmorphone (DILAUDID) injection, LORazepam, ondansetron (ZOFRAN) IV, oxyCODONE Medications Prior to Admission:  Prior to Admission medications   Medication Sig Start Date End Date Taking? Authorizing Provider  dexlansoprazole (DEXILANT) 60 MG capsule Take 1 capsule (60 mg total) by mouth daily. 10/01/15  Yes Baird Cancer, PA-C  HYDROcodone-acetaminophen (NORCO/VICODIN) 5-325 MG tablet Take 1-2 tablets by mouth every 4 (four) hours as needed. Patient taking differently: Take 1-2 tablets by mouth every 4  (four) hours as needed for moderate pain or severe pain.  04/07/16  Yes Patrici Ranks, MD  lidocaine-prilocaine (EMLA) cream Apply 1 application topically as needed.   Yes Historical Provider, MD  lisinopril (PRINIVIL,ZESTRIL) 10 MG tablet Take 10 mg by mouth daily.   Yes Historical Provider, MD  LORazepam (ATIVAN) 0.5 MG tablet Take 1 tablet (0.5 mg total) by mouth every 6 (six) hours as needed (nasuea/vomiting). 01/29/16  Yes Baird Cancer, PA-C  Multiple Vitamin (MULTIVITAMIN) capsule Take 1 capsule by mouth daily.   Yes Historical Provider, MD  oxyCODONE-acetaminophen (PERCOCET/ROXICET) 5-325 MG tablet Take 1 tablet by mouth every 4 (four) hours as needed for severe pain. 12/28/15  Yes Patrici Ranks, MD  prochlorperazine (COMPAZINE) 10 MG tablet Take 1 tablet (10 mg total) by mouth 2 (two) times daily as needed for nausea or vomiting. 02/26/16  Yes Isla Pence, MD  promethazine (PHENERGAN) 25 MG suppository Place 1 suppository (25 mg total) rectally every 6 (six) hours as needed. Patient taking differently: Place 25 mg rectally every 6 (six) hours as needed for nausea or vomiting.  02/19/16  Yes Manon Hilding Kefalas, PA-C  sucralfate (CARAFATE) 1 GM/10ML suspension Take 10 mLs (1 g total) by mouth 4 (four) times daily -  with meals and at bedtime. 11/09/15  Yes Patrici Ranks, MD  traMADol (ULTRAM) 50 MG tablet Take 1 tablet (50 mg total) by mouth every 6 (six) hours as needed for moderate pain. 01/08/16  Yes Baird Cancer, PA-C   Allergies  Allergen Reactions  . Asa [Aspirin] Hives   Review of Systems  Unable to perform ROS: Other    Physical Exam  Constitutional: She is oriented to person, place, and time. No distress.  Obese  HENT:  Head: Normocephalic and atraumatic.  Cardiovascular: Normal rate and regular rhythm.   Pulmonary/Chest: Effort normal. No respiratory distress.  Mild SOB with lengthy conversation  Abdominal: Soft.  Obese abdomen  Neurological: She is alert  and oriented to person, place, and time.  Skin: Skin is warm.  Psychiatric:  Pleasant, makes and keeps eye contact.  very upbeat and positive most of the time, but serious when necessary.  Nursing note and vitals reviewed.   Vital Signs: BP (!) 170/109 (BP Location: Left Arm)   Pulse 95   Temp 99.5 F (37.5 C) (Oral)   Resp 18   Ht '5\' 4"'$  (1.626 m)   Wt 100.7 kg (222 lb)   LMP 10/12/2013   SpO2 99%   BMI 38.11 kg/m  Pain Assessment: No/denies pain   Pain Score: 4    SpO2: SpO2: 99 % O2 Device:SpO2: 99 % O2 Flow Rate: .   IO: Intake/output summary:  Intake/Output Summary (Last 24 hours) at 04/23/16 1817 Last data filed at 04/23/16 1300  Gross per 24 hour  Intake              480 ml  Output              100 ml  Net              380 ml    LBM: Last BM Date: 04/22/16 Baseline Weight: Weight: 101 kg (222 lb 9.6 oz) Most recent weight: Weight: 100.7 kg (222 lb)     Palliative Assessment/Data:   Flowsheet Rows   Flowsheet Row Most Recent Value  Intake Tab  Referral Department  Hospitalist  Unit at Time of Referral  Med/Surg Unit  Palliative Care Primary Diagnosis  Cancer  Date Notified  04/22/16  Palliative Care Type  New Palliative care  Reason for referral  Clarify Goals of Care  Date of Admission  04/22/16  # of days IP prior to Palliative referral  0  Clinical Assessment  Palliative Performance Scale Score  70%  Pain Max last 24 hours  10  Pain Min Last 24 hours  3  Dyspnea Max Last 24 Hours  4  Dyspnea Min Last 24 hours  1  Psychosocial & Spiritual Assessment  Palliative Care Outcomes  Patient/Family meeting held?  Yes  Who was at the meeting?  Patient only today  Palliative Care Outcomes  Clarified goals of care, Provided psychosocial or spiritual support, Provided advance care planning  Palliative Care follow-up planned  -- [Follow-up all at [facility]]      Time In: 1540 Time Out: 1640 Time Total: 60 minutes Greater than 50%  of this time was spent  counseling and coordinating care related to the above assessment and plan.  Signed by: Drue Novel, NP   Please contact Palliative Medicine Team phone at [phone] for questions and concerns.  For individual provider: See Shea Evans

## 2016-04-23 NOTE — Assessment & Plan Note (Addendum)
Stage IV colon cancer, KRAS mutated, with imaging on 12/24/2015 showing progression of disease resulting in a referral to [facility] for consideration of clinical trial.  She has seen Dr. Teryl Lucy at [facility] on 12/31/2015.  Re-biopsy of hepatic lesion took place on 02/04/2016 with pathology reporting metastatic adenocarcinoma of GI primary.  This specimen was sent for [facility] testing. She was not eligible for trials at [facility] Patient was then seen at [facility] and eligible for clinical trial, she declined. BRIP1 mutation was noted, [facility] one was contacted and a high mutation frequency was noted.   Oncology history is updated.  We are currently not having luck obtaining a parp inhibitor based upon BRIP1 mutation status, patient is realistically too sick. In addition, little is known about response concerning these drugs in her current disease.

## 2016-04-23 NOTE — Progress Notes (Signed)
PROGRESS NOTE  Erica Barker  LYY:[id number] DOB: 1961/05/12 DOA: 04/22/2016 PCP: No PCP Per Patient Outpatient Specialists:  Subjective: She reported pain is controlled only when she takes medications, she has take every 4-6 hours. She has to wake up in the middle of the night to take pain medications, reports feeling sleepy for an hour after he takes pain medicine.  Brief Narrative:  Erica Barker  is a 55 y.o. female, History of stage IV colorectal cancer with metastases to the liver, lungs, patient recommended palliative care but patient refusing currently, hypertension, diet-controlled type 2 diabetes mellitus, morbid obesity, depression, anxiety who had a routine outpatient appointment with her oncologist, during her appointment she mentioned that her right upper quadrant pain was getting worse, her oncologist and called me for a direct admission for pain control.  Assessment & Plan:   Principal Problem:   Colon cancer metastasized to liver [facility]) Active Problems:   Hypertension   Depression   Pulmonary nodules   Obesity, morbid ([place])   Medically noncompliant   RUQ pain   Right upper quadrant abdominal pain -Secondary to colorectal cancer with metastases to the liver and lungs, this is due to capsular stretching.  -Patient feels much better with Percocet, she was on IV Decadron, switched to oral. -We'll switch Percocet to OxyIR, try to avoid Tylenol component as she does have extensive liver metastases. -Started on OxyContin 15 mg, discussed with Dr. Whitney Muse.  Stage IV breast cancer with metastases to the liver and lungs -Discussed her case with her oncologist Dr. Whitney Muse to commence palliative care, patient for now was to be full code -Palliative care called for goals of care. Per Dr. Whitney Muse patient is in somewhat of a denial.  Leukocytosis -Has chronic leukocytosis, this is worsened while she is in the hospital secondary to IV dexamethasone. -She does not have  evidence of infection.  Hypertension -For now as needed IV hydralazine.  Anxiety depression -Placed on home dose Xanax and Lexapro.   DVT prophylaxis: SubQ Heparin Code Status: Prior Family Communication:  Disposition Plan:  Diet: DIET SOFT Room service appropriate? Yes; Fluid consistency: Thin  Consultants:   None  Procedures:   None  Antimicrobials:   None  Objective: Vitals:   04/22/16 1426 04/22/16 1511 04/22/16 2114 04/23/16 0452  BP: (!) 156/105  (!) 143/96 (!) 162/102  Pulse: 97  95 89  Resp: '20  20 20  '$ Temp: 98.1 F (36.7 C)  97.7 F (36.5 C) 98.8 F (37.1 C)  TempSrc: Oral  Oral Oral  SpO2: 99%  98% 100%  Weight: 101 kg (222 lb 9.6 oz) 100.7 kg (222 lb)    Height: '5\' 4"'$  (1.626 m) '5\' 4"'$  (1.626 m)      Intake/Output Summary (Last 24 hours) at 04/23/16 1145 Last data filed at 04/23/16 1109  Gross per 24 hour  Intake              245 ml  Output              100 ml  Net              145 ml   Filed Weights   04/22/16 1426 04/22/16 1511  Weight: 101 kg (222 lb 9.6 oz) 100.7 kg (222 lb)    Examination: General exam: Appears calm and comfortable  Respiratory system: Clear to auscultation. Respiratory effort normal. Cardiovascular system: S1 & S2 heard, RRR. No JVD, murmurs, rubs, gallops or clicks. No pedal edema. Gastrointestinal system: Abdomen  is nondistended, soft and nontender. No organomegaly or masses felt. Normal bowel sounds heard. Central nervous system: Alert and oriented. No focal neurological deficits. Extremities: Symmetric 5 x 5 power. Skin: No rashes, lesions or ulcers Psychiatry: Judgement and insight appear normal. Mood & affect appropriate.   Data Reviewed: I have personally reviewed following labs and imaging studies  CBC:  Recent Labs Lab 04/22/16 1548  WBC 20.6*  HGB 9.4*  HCT 30.1*  MCV 85.0  PLT 314*   Basic Metabolic Panel:  Recent Labs Lab 04/22/16 1548  NA 133*  K 3.0*  CL 98*  CO2 24  GLUCOSE 84  BUN  5*  CREATININE 0.44  CALCIUM 8.5*   GFR: Estimated Creatinine Clearance: 93.8 mL/min (by C-G formula based on SCr of 0.44 mg/dL). Liver Function Tests:  Recent Labs Lab 04/22/16 1548  AST 83*  ALT 33  ALKPHOS 643*  BILITOT 1.3*  PROT 7.3  ALBUMIN 2.2*   No results for input(s): LIPASE, AMYLASE in the last 168 hours. No results for input(s): AMMONIA in the last 168 hours. Coagulation Profile:  Recent Labs Lab 04/22/16 1548  INR 1.11   Cardiac Enzymes: No results for input(s): CKTOTAL, CKMB, CKMBINDEX, TROPONINI in the last 168 hours. BNP (last 3 results) No results for input(s): PROBNP in the last 8760 hours. HbA1C: No results for input(s): HGBA1C in the last 72 hours. CBG: No results for input(s): GLUCAP in the last 168 hours. Lipid Profile: No results for input(s): CHOL, HDL, LDLCALC, TRIG, CHOLHDL, LDLDIRECT in the last 72 hours. Thyroid Function Tests: No results for input(s): TSH, T4TOTAL, FREET4, T3FREE, THYROIDAB in the last 72 hours. Anemia Panel: No results for input(s): VITAMINB12, FOLATE, FERRITIN, TIBC, IRON, RETICCTPCT in the last 72 hours. Urine analysis:    Component Value Date/Time   COLORURINE YELLOW 02/26/2016 1938   APPEARANCEUR CLEAR 02/26/2016 1938   LABSPEC 1.010 02/26/2016 1938   PHURINE 5.5 02/26/2016 1938   GLUCOSEU NEGATIVE 02/26/2016 1938   HGBUR TRACE (A) 02/26/2016 1938   BILIRUBINUR NEGATIVE 02/26/2016 1938   KETONESUR NEGATIVE 02/26/2016 1938   PROTEINUR NEGATIVE 02/26/2016 1938   UROBILINOGEN 0.2 05/15/2015 0928   NITRITE NEGATIVE 02/26/2016 1938   LEUKOCYTESUR NEGATIVE 02/26/2016 1938   Sepsis Labs: '@LABRCNTIP'$ (procalcitonin:4,lacticidven:4)  )No results found for this or any previous visit (from the past 240 hour(s)).   Invalid input(s): PROCALCITONIN, LACTICACIDVEN   Radiology Studies: Dg Chest Port 1 View  Result Date: 04/22/2016 CLINICAL DATA:  Shortness of breath in patient with metastatic colon cancer. EXAM:  PORTABLE CHEST 1 VIEW COMPARISON:  Single-view of the chest 02/18/2016. FINDINGS: Lung volumes are low. Bilateral pulmonary nodules and masses consistent with metastatic disease are again seen. No consolidative process, pneumothorax or effusion. Heart size is normal. IMPRESSION: No acute disease in patient with bilateral pulmonary metastasis from colon carcinoma. Electronically Signed   By: Inge Rise M.D.   On: 04/22/2016 13:53   Dg Abd Portable 1v  Result Date: 04/22/2016 CLINICAL DATA:  Generalized abdominal pain. EXAM: PORTABLE ABDOMEN - 1 VIEW COMPARISON:  Radiographs of February 18, 2016. FINDINGS: The bowel gas pattern is normal. Phleboliths are noted in the pelvis. No renal calculi are noted. IMPRESSION: No evidence of bowel obstruction or ileus. Electronically Signed   By: Marijo Conception, M.D.   On: 04/22/2016 13:54        Scheduled Meds: . dexamethasone  10 mg Intravenous Q24H  . escitalopram  10 mg Oral Daily  . heparin subcutaneous  5,000 Units  Subcutaneous Q8H  . pantoprazole  40 mg Oral Daily  . potassium chloride  40 mEq Oral Q6H   Continuous Infusions: . sodium chloride 75 mL/hr at 04/22/16 1456     LOS: 0 days    Time spent: 35 minutes    Zadia Uhde A, MD [facility] Pager [phone]  If 7PM-7AM, please contact night-coverage [email or web address] Password [email or web address] 04/23/2016, 11:45 AM

## 2016-04-24 DIAGNOSIS — Z7189 Other specified counseling: Secondary | ICD-10-CM

## 2016-04-24 DIAGNOSIS — Z515 Encounter for palliative care: Secondary | ICD-10-CM

## 2016-04-24 DIAGNOSIS — I10 Essential (primary) hypertension: Secondary | ICD-10-CM | POA: Diagnosis not present

## 2016-04-24 DIAGNOSIS — R0602 Shortness of breath: Secondary | ICD-10-CM

## 2016-04-24 DIAGNOSIS — C189 Malignant neoplasm of colon, unspecified: Secondary | ICD-10-CM | POA: Diagnosis not present

## 2016-04-24 DIAGNOSIS — C787 Secondary malignant neoplasm of liver and intrahepatic bile duct: Secondary | ICD-10-CM | POA: Diagnosis not present

## 2016-04-24 LAB — BASIC METABOLIC PANEL
Anion gap: 8 (ref 5–15)
BUN: 6 mg/dL (ref 6–20)
CALCIUM: 8.8 mg/dL — AB (ref 8.9–10.3)
CO2: 23 mmol/L (ref 22–32)
CREATININE: 0.4 mg/dL — AB (ref 0.44–1.00)
Chloride: 102 mmol/L (ref 101–111)
GFR calc Af Amer: >60 mL/min (ref >60)
GLUCOSE: 140 mg/dL — AB (ref 65–99)
Potassium: 4.8 mmol/L (ref 3.5–5.1)
Sodium: 133 mmol/L — ABNORMAL LOW (ref 135–145)

## 2016-04-24 LAB — MAGNESIUM: Magnesium: 1.9 mg/dL (ref 1.7–2.4)

## 2016-04-24 MED ORDER — SENNA 8.6 MG PO TABS: 2.0000 | ORAL_TABLET | Freq: Every day | ORAL | 0 refills | Status: AC

## 2016-04-24 MED ORDER — LORAZEPAM 0.5 MG PO TABS: 0.5000 mg | ORAL_TABLET | Freq: Every day | ORAL | Status: DC

## 2016-04-24 MED ORDER — OXYCODONE HCL ER 15 MG PO T12A: 15.0000 mg | EXTENDED_RELEASE_TABLET | Freq: Two times a day (BID) | ORAL | 0 refills | Status: DC

## 2016-04-24 MED ORDER — DEXAMETHASONE 4 MG PO TABS: 4.0000 mg | ORAL_TABLET | Freq: Two times a day (BID) | ORAL | 0 refills | Status: AC

## 2016-04-24 MED ORDER — OXYCODONE HCL 5 MG PO TABS: 5.0000 mg | ORAL_TABLET | ORAL | 0 refills | Status: DC | PRN

## 2016-04-24 MED ORDER — POLYETHYLENE GLYCOL 3350 17 G PO PACK: 17.0000 g | PACK | Freq: Every day | ORAL | 0 refills | Status: AC

## 2016-04-24 NOTE — Care Management (Signed)
Patient has spoken with palliative NP again prior to discharge and has agreed to have home hospices services. She has elected to use [facility]. Notified Charles with [facility] and faxed referral. He plans to see patient after she discharges home.

## 2016-04-24 NOTE — Care Management (Addendum)
Tyerra, [place] #[id number] (CSN: [id number]) SS# [id number]  [phone] y.o. F) (Adm: 04/22/16)  [id number]    PCP  NO PCP PER PATIENT   Demographics  Comment    Last edited by  on at   Address: Home Phone: Work Designer, industrial/product: Mobile Phone:  [place], [place]  [place] [address]   [phone] -- [phone]  SSN: Insurance: Marital Status: Religion:  [id number] TRICARE Widowed Christian  Patient Ethnicity & Race   Ethnic Group Patient Race  Not Hispanic or Latino Black or African American  Documents Filed to Patient   Power of Attorney Living Will Clinical Unknown Study Attachment Consent Form ABN Waiver After Visit Summary Lab Result Scan Code Status MyChart Status Advance Care Planning  Not on File Not on File Not on File Not on File Filed Not on File Filed Filed Prior Active Jump to the Activity  Admission Information   Attending Provider Admitting Provider Admission Type Admission Date/Time  Verlee Monte, MD Thurnell Lose, MD Urgent 04/22/16 1146  Discharge Date Hospital Service Auth/Cert Status Service Area   Internal Medicine Incomplete [place]  Unit Room/Bed Admission Status   AP-DEPT 300 A335/A335-01 Admission (Confirmed)   Hospital Account   Name Acct ID Class Status Primary Coverage  Libby, [place] [id number] Observation Open TRICARE - TRICARE      Guarantor Account (for [place] [id number])   Name Relation to Pt Service Area Active? Acct Type  Velora Mediate Self [facility] Yes Personal/Family  Address Phone    [address] [place], [place] [address] [phone])        Coverage Information (for Hospital Account [id number])   1. TRICARE/TRICARE   F/O Payor/Plan Precert #  TRICARE/TRICARE   Subscriber Subscriber #  [place], [place] [id number]  Address Phone  [address] [place], [place] [address] [phone]  2. MEDICAID [place]/MEDICAID [place] ACCESS   F/O Payor/Plan Precert #  MEDICAID  [place]/MEDICAID [place] ACCESS   Subscriber Subscriber #  Brittaney, [place] [id number]  Address Phone  [address] [place], [place] [address]

## 2016-04-24 NOTE — Progress Notes (Signed)
Daily Progress Note   Patient Name: Erica Barker       Date: 04/24/2016 DOB: Feb 06, 1961  Age: 55 y.o. MRN#: [id number] Attending Physician: Verlee Monte, MD Primary Care Physician: No PCP Per Patient Admit Date: 04/22/2016  Reason for Consultation/Follow-up: Disposition, Establishing goals of care, Hospice Evaluation and Psychosocial/spiritual support  Subjective: Erica Barker is resting quietly in bed. She greets me, making and keeping eye contact as I enter. She seems somewhat sleepy this morning. She shares that she took Ativan to help with sleep at 11 PM. We discussed taking this medication anywhere from 1 1/2 to 2 hours before she anticipates sleep. We talk about prognosis. I share that we anticipate she may have as little as 3 months left. Erica Barker states that she is surprised by this.  We talk about functional decline, decreased energy. And that she will likely need more help at home in the future. We talk in detail about the benefits of hospice in home. I share that this will be an extra layer of support for her, a registered nurse and CNA coming to her home at no cost. She asks, "is this for the future?", and I share that hospice in her home will help her now. I encouraged her to try the services, sharing that she will enjoy the workers. I share that hospice does nothing to shorten life, but instead will improve her life. We discuss her choices for in-home service, and she chooses [facility].   Length of Stay: 0  Current Medications: Scheduled Meds:  . dexamethasone  4 mg Oral Q12H  . escitalopram  10 mg Oral Daily  . heparin subcutaneous  5,000 Units Subcutaneous Q8H  . LORazepam  0.5 mg Oral QHS  . oxyCODONE  15 mg Oral Q12H  . pantoprazole  40 mg Oral Daily  . polyethylene  glycol  17 g Oral Daily  . senna  2 tablet Oral Daily    Continuous Infusions:    PRN Meds: hydrALAZINE, HYDROmorphone (DILAUDID) injection, LORazepam, ondansetron (ZOFRAN) IV, oxyCODONE  Physical Exam  Constitutional: She is oriented to person, place, and time. No distress.  Greets me, making and keeping eye contact  HENT:  Head: Normocephalic and atraumatic.  Cardiovascular: Normal rate.   Pulmonary/Chest: Effort normal. No respiratory distress.  Abdominal: Soft. She exhibits distension.  Obese,  distended abdomen  Neurological: She is alert and oriented to person, place, and time.  Skin: Skin is warm and dry.  Psychiatric: Her behavior is normal. Thought content normal.  Nursing note and vitals reviewed.           Vital Signs: BP (!) 178/117 (BP Location: Left Arm)   Pulse 94   Temp 98.3 F (36.8 C) (Oral)   Resp 18   Ht '5\' 4"'$  (1.626 m)   Wt 100.7 kg (222 lb)   LMP 10/12/2013   SpO2 97%   BMI 38.11 kg/m  SpO2: SpO2: 97 % O2 Device: O2 Device: Not Delivered O2 Flow Rate:    Intake/output summary:  Intake/Output Summary (Last 24 hours) at 04/24/16 1319 Last data filed at 04/24/16 0500  Gross per 24 hour  Intake                0 ml  Output              800 ml  Net             -800 ml   LBM: Last BM Date: 04/24/16 Baseline Weight: Weight: 101 kg (222 lb 9.6 oz) Most recent weight: Weight: 100.7 kg (222 lb)       Palliative Assessment/Data:    Flowsheet Rows   Flowsheet Row Most Recent Value  Intake Tab  Referral Department  Hospitalist  Unit at Time of Referral  Med/Surg Unit  Palliative Care Primary Diagnosis  Cancer  Date Notified  04/22/16  Palliative Care Type  New Palliative care  Reason for referral  Clarify Goals of Care  Date of Admission  04/22/16  # of days IP prior to Palliative referral  0  Clinical Assessment  Palliative Performance Scale Score  70%  Pain Max last 24 hours  10  Pain Min Last 24 hours  3  Dyspnea Max Last 24 Hours  4    Dyspnea Min Last 24 hours  1  Psychosocial & Spiritual Assessment  Palliative Care Outcomes  Patient/Family meeting held?  Yes  Who was at the meeting?  Patient only today  Palliative Care Outcomes  Clarified goals of care, Provided psychosocial or spiritual support, Provided advance care planning  Palliative Care follow-up planned  -- [Follow-up all at [facility]]      Patient Active Problem List   Diagnosis Date Noted  . Cancer related pain 04/23/2016  . Counseling regarding end of life decision making 04/23/2016  . SOB (shortness of breath)   . Palliative care encounter   . Goals of care, counseling/discussion   . DNR (do not resuscitate) discussion   . RUQ pain 04/22/2016  . Rectal bleeding 12/24/2015  . Colon cancer metastasized to liver ([place])   . Malignant neoplasm of colon ([place])   . Medically noncompliant 09/06/2015  . 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 ([place]) 10/09/2014  . Diabetes mellitus type 2 in obese ([place]) 02/03/2014  . Obesity, morbid ([place]) 02/03/2014  . Hypertension   . Depression   . Pulmonary nodules 01/30/2014  . Lymphadenopathy, mediastinal 01/11/2014    Palliative Care Assessment & Plan   Patient Profile: 55 y.o. 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   Assessment: 55 y.o. 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   Recommendations/Plan:  Erica Barker has accepted the services of [facility] hospice in her home.   Pain, management by Dr. Whitney Muse.  Anxiety, Ativan 0.5 mg PO schedule QHS, Ativan 0.5 mg PO Q6 hours PRN already in place.  End of life, we discuss prognosis of 3 months or less. Erica Barker states that she is surprised by this. We discuss the likelihood of  functional decline, the benefits of hospice and home, and the continuation of her CAP Aid.  Goals of Care and Additional Recommendations:  Limitations on Scope of Treatment: Erica Barker is aware that no further chemotherapy is being offered. She states that she can call Dr. Whitney Muse for any needs.  Code Status: Code Status History    Date Active Date Inactive Code Status Order ID Comments User Context   12/24/2015  2:30 AM 12/25/2015  2:04 PM Full Code [id number]  Rise Patience, MD ED   02/15/2014 12:07 PM 02/16/2014  3:34 AM Full Code [id number]  Greggory Keen, MD HOV       Prognosis:   < 3 months, likely based on colorectal cancer with metastatic burden to liver and lungs.  Discharge Planning:  Home with the benefits of [facility] hospice.   Care plan was discussed with nursing staff, case manager, social worker, and Dr. Hartford Poli.   Thank you for allowing the Palliative Medicine Team to assist in the care of this patient.   Time In: 1030 Time Out: 1110 Total Time 40 minutes Prolonged Time Billed  no       Greater than 50%  of this time was spent counseling and coordinating care related to the above assessment and plan.  Drue Novel, NP  Please contact Palliative Medicine Team phone at [phone] for questions and concerns.

## 2016-04-24 NOTE — Discharge Summary (Signed)
Physician Discharge Summary  Erica Barker RJJ:[id number] DOB: 1961-05-07 DOA: 04/22/2016  PCP: No PCP Per Patient  Admit date: 04/22/2016 Discharge date: 04/24/2016  Admitted From: Home Disposition: Home  Recommendations for Outpatient Follow-up:  1. Follow up with PCP in 1-2 weeks 2. Please obtain BMP/CBC in one week 3. Started on OxyContin 15 mg twice a day and OxyIR 5-10 mg every 4 hours as. 4. Hospice to follow patient at home.   Home Health: None Equipment/Devices: None  Discharge Condition: Guarded CODE STATUS: Full code Diet recommendation: Heart Healthy  Brief/Interim Summary: MichelleJacksonis a 55 y.o.female,History of stage IV colorectal cancer with metastases to the liver, lungs, patient recommended palliative care but patient refusing currently, hypertension, diet-controlled type 2 diabetes mellitus, morbid obesity, depression, anxiety who had a routine outpatient appointment with her oncologist, during her appointment she mentioned that her right upper quadrant pain was getting worse, her oncologist and called me for a direct admission for pain control.  Discharge Diagnoses:  Principal Problem:   Colon cancer metastasized to liver [facility]) Active Problems:   Hypertension   Depression   Pulmonary nodules   Obesity, morbid ([place])   Medically noncompliant   RUQ pain   SOB (shortness of breath)   Palliative care encounter   Goals of care, counseling/discussion   DNR (do not resuscitate) discussion   RUQ abdominal pain -Secondary to colorectal cancer with metastases to the liver and lungs, this is due to capsular stretching.  -Patient feels much better with Percocet, she was on IV Decadron, switched to oral. -We'll switch Percocet to OxyIR, try to avoid Tylenol component as she does have extensive liver metastases. -Discussed with Dr. [place], start on OxyContin 50 mg twice a day, OxyIR every 4 hours -Discharged also on 4 mg of dexamethasone twice a day to  help with inflammation/swelling. -Discharged on MiraLAX/Senokot to avoid opioid-induced constipation.  Stage IV breast cancer with metastases to the liver and lungs -Palliative care called for goals of care. Per Dr. Whitney Muse patient is in somewhat of a denial. -Poor prognosis has patient has her disease progressed since the summer. -Palliative care consulted, patient is likely to enroll in hospice as outpatient.  Leukocytosis -Has chronic leukocytosis, this is worsened while she is in the hospital secondary to IV dexamethasone. -She does not have evidence of infection.  Hypertension -For now as needed IV hydralazine.  Anxiety depression -Placed on home dose Xanax and Lexapro.  Discharge Instructions  Discharge Instructions    Diet - low sodium heart healthy    Complete by:  As directed    Increase activity slowly    Complete by:  As directed        Medication List    STOP taking these medications   HYDROcodone-acetaminophen 5-325 MG tablet Commonly known as:  NORCO/VICODIN   oxyCODONE-acetaminophen 5-325 MG tablet Commonly known as:  PERCOCET/ROXICET   traMADol 50 MG tablet Commonly known as:  ULTRAM     TAKE these medications   dexamethasone 4 MG tablet Commonly known as:  DECADRON Take 1 tablet (4 mg total) by mouth every 12 (twelve) hours.   dexlansoprazole 60 MG capsule Commonly known as:  DEXILANT Take 1 capsule (60 mg total) by mouth daily.   lidocaine-prilocaine cream Commonly known as:  EMLA Apply 1 application topically as needed.   lisinopril 10 MG tablet Commonly known as:  PRINIVIL,ZESTRIL Take 10 mg by mouth daily.   LORazepam 0.5 MG tablet Commonly known as:  ATIVAN Take 1 tablet (0.5 mg  total) by mouth every 6 (six) hours as needed (nasuea/vomiting).   multivitamin capsule Take 1 capsule by mouth daily.   oxyCODONE 5 MG immediate release tablet Commonly known as:  Oxy IR/ROXICODONE Take 1-2 tablets (5-10 mg total) by mouth every 4  (four) hours as needed for severe pain.   oxyCODONE 15 mg 12 hr tablet Commonly known as:  OXYCONTIN Take 1 tablet (15 mg total) by mouth every 12 (twelve) hours.   polyethylene glycol packet Commonly known as:  MIRALAX / GLYCOLAX Take 17 g by mouth daily. Start taking on:  04/25/2016   prochlorperazine 10 MG tablet Commonly known as:  COMPAZINE Take 1 tablet (10 mg total) by mouth 2 (two) times daily as needed for nausea or vomiting.   promethazine 25 MG suppository Commonly known as:  PHENERGAN Place 1 suppository (25 mg total) rectally every 6 (six) hours as needed. What changed:  reasons to take this   senna 8.6 MG Tabs tablet Commonly known as:  SENOKOT Take 2 tablets (17.2 mg total) by mouth daily. Start taking on:  04/25/2016   sucralfate 1 GM/10ML suspension Commonly known as:  CARAFATE Take 10 mLs (1 g total) by mouth 4 (four) times daily -  with meals and at bedtime.      Follow-up Information    [facility] .   Why:  Faxed referral to [place] with Rocky Morel will see patient at home after discharged.  Contact information: [place] [address] [phone]          Allergies  Allergen Reactions  . Diona Fanti [Aspirin] Hives    Consultations:  Palliative   Procedures/Studies: Dg Chest Port 1 View  Result Date: 04/22/2016 CLINICAL DATA:  Shortness of breath in patient with metastatic colon cancer. EXAM: PORTABLE CHEST 1 VIEW COMPARISON:  Single-view of the chest 02/18/2016. FINDINGS: Lung volumes are low. Bilateral pulmonary nodules and masses consistent with metastatic disease are again seen. No consolidative process, pneumothorax or effusion. Heart size is normal. IMPRESSION: No acute disease in patient with bilateral pulmonary metastasis from colon carcinoma. Electronically Signed   By: Inge Rise M.D.   On: 04/22/2016 13:53   Dg Abd Portable 1v  Result Date: 04/22/2016 CLINICAL DATA:   Generalized abdominal pain. EXAM: PORTABLE ABDOMEN - 1 VIEW COMPARISON:  Radiographs of February 18, 2016. FINDINGS: The bowel gas pattern is normal. Phleboliths are noted in the pelvis. No renal calculi are noted. IMPRESSION: No evidence of bowel obstruction or ileus. Electronically Signed   By: Marijo Conception, M.D.   On: 04/22/2016 13:54    (Echo, Carotid, EGD, Colonoscopy, ERCP)    Subjective:   Discharge Exam: Vitals:   04/23/16 2123 04/24/16 0500  BP: (!) 172/114 (!) 178/117  Pulse: 100 94  Resp: 20 18  Temp:  98.3 F (36.8 C)   Vitals:   04/23/16 0452 04/23/16 1358 04/23/16 2123 04/24/16 0500  BP: (!) 162/102 (!) 170/109 (!) 172/114 (!) 178/117  Pulse: 89 95 100 94  Resp: '20 18 20 18  '$ Temp: 98.8 F (37.1 C) 99.5 F (37.5 C)  98.3 F (36.8 C)  TempSrc: Oral Oral  Oral  SpO2: 100% 99% 100% 97%  Weight:      Height:        General: Pt is alert, awake, not in acute distress Cardiovascular: RRR, S1/S2 +, no rubs, no gallops Respiratory: CTA bilaterally, no wheezing, no rhonchi Abdominal: Soft, NT, ND, bowel sounds + Extremities: no  edema, no cyanosis    The results of significant diagnostics from this hospitalization (including imaging, microbiology, ancillary and laboratory) are listed below for reference.     Microbiology: No results found for this or any previous visit (from the past 240 hour(s)).   Labs: BNP (last 3 results) No results for input(s): BNP in the last 8760 hours. Basic Metabolic Panel:  Recent Labs Lab 04/22/16 1548 04/24/16 0605  NA 133* 133*  K 3.0* 4.8  CL 98* 102  CO2 24 23  GLUCOSE 84 140*  BUN 5* 6  CREATININE 0.44 0.40*  CALCIUM 8.5* 8.8*  MG  --  1.9   Liver Function Tests:  Recent Labs Lab 04/22/16 1548  AST 83*  ALT 33  ALKPHOS 643*  BILITOT 1.3*  PROT 7.3  ALBUMIN 2.2*   No results for input(s): LIPASE, AMYLASE in the last 168 hours. No results for input(s): AMMONIA in the last 168 hours. CBC:  Recent  Labs Lab 04/22/16 1548  WBC 20.6*  HGB 9.4*  HCT 30.1*  MCV 85.0  PLT 541*   Cardiac Enzymes: No results for input(s): CKTOTAL, CKMB, CKMBINDEX, TROPONINI in the last 168 hours. BNP: Invalid input(s): POCBNP CBG: No results for input(s): GLUCAP in the last 168 hours. D-Dimer No results for input(s): DDIMER in the last 72 hours. Hgb A1c No results for input(s): HGBA1C in the last 72 hours. Lipid Profile No results for input(s): CHOL, HDL, LDLCALC, TRIG, CHOLHDL, LDLDIRECT in the last 72 hours. Thyroid function studies No results for input(s): TSH, T4TOTAL, T3FREE, THYROIDAB in the last 72 hours.  Invalid input(s): FREET3 Anemia work up No results for input(s): VITAMINB12, FOLATE, FERRITIN, TIBC, IRON, RETICCTPCT in the last 72 hours. Urinalysis    Component Value Date/Time   COLORURINE YELLOW 02/26/2016 1938   APPEARANCEUR CLEAR 02/26/2016 1938   LABSPEC 1.010 02/26/2016 1938   PHURINE 5.5 02/26/2016 1938   GLUCOSEU NEGATIVE 02/26/2016 1938   HGBUR TRACE (A) 02/26/2016 1938   BILIRUBINUR NEGATIVE 02/26/2016 1938   KETONESUR NEGATIVE 02/26/2016 1938   PROTEINUR NEGATIVE 02/26/2016 1938   UROBILINOGEN 0.2 05/15/2015 0928   NITRITE NEGATIVE 02/26/2016 1938   LEUKOCYTESUR NEGATIVE 02/26/2016 1938   Sepsis Labs Invalid input(s): PROCALCITONIN,  WBC,  LACTICIDVEN Microbiology No results found for this or any previous visit (from the past 240 hour(s)).   Time coordinating discharge: Over 30 minutes  SIGNED:   Birdie Hopes, MD  [facility] 04/24/2016, 11:53 AM Pager   If 7PM-7AM, please contact night-coverage [email or web address] Password [email or web address]

## 2016-04-24 NOTE — Progress Notes (Signed)
Erica Barker discharged Home per MD order.  Discharge instructions reviewed and discussed with the patient, all questions and concerns answered. Copy of instructions and scripts given to patient.    Medication List    STOP taking these medications   HYDROcodone-acetaminophen 5-325 MG tablet Commonly known as:  NORCO/VICODIN   oxyCODONE-acetaminophen 5-325 MG tablet Commonly known as:  PERCOCET/ROXICET   traMADol 50 MG tablet Commonly known as:  ULTRAM     TAKE these medications   dexamethasone 4 MG tablet Commonly known as:  DECADRON Take 1 tablet (4 mg total) by mouth every 12 (twelve) hours.   dexlansoprazole 60 MG capsule Commonly known as:  DEXILANT Take 1 capsule (60 mg total) by mouth daily.   lidocaine-prilocaine cream Commonly known as:  EMLA Apply 1 application topically as needed.   lisinopril 10 MG tablet Commonly known as:  PRINIVIL,ZESTRIL Take 10 mg by mouth daily.   LORazepam 0.5 MG tablet Commonly known as:  ATIVAN Take 1 tablet (0.5 mg total) by mouth every 6 (six) hours as needed (nasuea/vomiting).   multivitamin capsule Take 1 capsule by mouth daily.   oxyCODONE 5 MG immediate release tablet Commonly known as:  Oxy IR/ROXICODONE Take 1-2 tablets (5-10 mg total) by mouth every 4 (four) hours as needed for severe pain.   oxyCODONE 15 mg 12 hr tablet Commonly known as:  OXYCONTIN Take 1 tablet (15 mg total) by mouth every 12 (twelve) hours.   polyethylene glycol packet Commonly known as:  MIRALAX / GLYCOLAX Take 17 g by mouth daily. Start taking on:  04/25/2016   prochlorperazine 10 MG tablet Commonly known as:  COMPAZINE Take 1 tablet (10 mg total) by mouth 2 (two) times daily as needed for nausea or vomiting.   promethazine 25 MG suppository Commonly known as:  PHENERGAN Place 1 suppository (25 mg total) rectally every 6 (six) hours as needed. What changed:  reasons to take this   senna 8.6 MG Tabs tablet Commonly known as:   SENOKOT Take 2 tablets (17.2 mg total) by mouth daily. Start taking on:  04/25/2016   sucralfate 1 GM/10ML suspension Commonly known as:  CARAFATE Take 10 mLs (1 g total) by mouth 4 (four) times daily -  with meals and at bedtime.       Patients skin is clean, dry and intact, no evidence of skin break down. IV site discontinued and catheter remains intact. Site without signs and symptoms of complications. Dressing and pressure applied.  Patient escorted to car by NT in a wheelchair,  no distress noted upon discharge.  Ralene Muskrat [name] 04/24/2016 4:58 PM

## 2016-04-25 ENCOUNTER — Telehealth (HOSPITAL_COMMUNITY): Payer: Self-pay | Admitting: Hematology & Oncology

## 2016-04-25 ENCOUNTER — Other Ambulatory Visit (HOSPITAL_COMMUNITY): Payer: Self-pay | Admitting: Hematology & Oncology

## 2016-04-25 NOTE — Telephone Encounter (Signed)
Contacted hospice to see if pt was enrolled. Per CSR pt was NOT enrolled at this time. Pc to pt to let her know that [facility] was trying to contact her. Gave her the contact info [phone] PT ref# [id number]

## 2016-04-28 ENCOUNTER — Other Ambulatory Visit (HOSPITAL_COMMUNITY): Payer: Self-pay | Admitting: Pharmacist

## 2016-04-28 ENCOUNTER — Encounter (HOSPITAL_COMMUNITY)

## 2016-04-28 DIAGNOSIS — C7800 Secondary malignant neoplasm of unspecified lung: Principal | ICD-10-CM

## 2016-04-28 DIAGNOSIS — C189 Malignant neoplasm of colon, unspecified: Secondary | ICD-10-CM

## 2016-04-28 DIAGNOSIS — C787 Secondary malignant neoplasm of liver and intrahepatic bile duct: Secondary | ICD-10-CM

## 2016-04-28 NOTE — Progress Notes (Unsigned)
Chemotherapy teaching pulled together, follow up appt made with labs.  Pt aware. Pt taught over the phone.  Pt will sign consent at next office visit.  Pt stared taking lynparza Sunday 04/27/2016 night.  She has been taking medication as prescribed and has had no problems with the medication.

## 2016-04-28 NOTE — Patient Instructions (Signed)
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  You have had progression.  We are starting you on a new medication called Lynparza (olaparib). You will take 300 mg (2 tablets) in the morning and in the evening.  Make sure these are 12 hours apart.  You can take this medication with or without food.  You will see the doctor regularly throughout treatment.  We monitor your lab work prior to every treatment.  The doctor monitors your response to treatment by the way you are feeling, your blood work, and scans periodically.  CHEMOTHERAPY INSTRUCTIONS  How should I take [place]? Take LYNPARZA tablets exactly as your health care provider tells you  Your health care provider may temporarily stop treatment with [facility] or change your dose of LYNPARZA if you experience side effects  LYNPARZA comes as tablets and capsules. LYNPARZA tablets and capsules are not the same. If your health care provider prescribes LYNPARZA tablets for you, do not take LYNPARZA capsules. Do not take more than 4 LYNPARZA tablets in 1 day. If you have any questions about LYNPARZA, talk to your health care provider or pharmacist  Take [facility] by mouth 2 times a day  Each dose should be taken about 12 hours apart  Swallow LYNPARZA tablets whole. Do not chew, crush, dissolve, or divide the tablets  Take LYNPARZA with or without food  If you miss a dose of LYNPARZA, take your next dose at your usual scheduled time. Do not take an extra dose to make up for a missed dose  If you take too much [facility], call your health care provider or go to the nearest hospital emergency room right away What should I avoid while taking LYNPARZA? Avoid grapefruit, grapefruit juice, [place] oranges, and [place] orange juice during treatment with LYNPARZA since they may increase the level of LYNPARZA in your blood    POTENTIAL SIDE EFFECTS OF TREATMENT:  LYNPARZA may cause serious side  effects. The most common side effects of LYNPARZA are: Nausea or vomiting. Tell your health care provider if you get nausea or vomiting. Your health care provider may prescribe medicines to treat these symptoms  Tiredness or weakness  Diarrhea  Headache  Changes in kidney function blood test  Low number of platelets  Changes in the way food tastes  Loss of appetite  Low number of red or white blood cells  Mouth sores  Respiratory infections  Indigestion or heartburn  Sore throat or runny nose  Upper respiratory infection  Cough  Pain in the joints, muscles, and back  Rash  Pain or discomfort in the stomach area  IMPORTANT SAFETY INFORMATION LYNPARZA may cause serious side effects, including: Bone marrow problems called Myelodysplastic Syndrome (MDS) or Acute Myeloid Leukemia (AML). Some people who have ovarian cancer and who have received previous treatment with chemotherapy, radiotherapy, or certain other medicines for their cancer have developed MDS or AML during treatment with LYNPARZA. MDS or AML may lead to death. If you develop MDS or AML, your health care provider will stop treatment with LYNPARZA. Symptoms of low blood cell counts are common during treatment with LYNPARZA, but can be a sign of serious bone marrow problems, including MDS or AML. Symptoms may include: weakness, weight loss, fever, frequent infections, blood in urine or stool, shortness of breath, feeling very tired, bruising or bleeding more easily. Your health care provider will do blood tests to check your blood cell counts: Before  treatment with LYNPARZA  Every month during treatment with LYNPARZA  Weekly if you have low blood cell counts that last a long time. Your health care provider may stop treatment with LYNPARZA until your blood cell counts improve Lung problems (pneumonitis). Tell your health care provider if you have any new or worsening symptoms of lung problems, including shortness of breath, fever,  cough, or wheezing. Your health care provider may do a chest x-ray if you have any of these symptoms. Your health care provider may temporarily stop treatment or completely stop treatment if you develop pneumonitis. Pneumonitis may lead to death. Before you take [facility], tell your health care provider about all your medical conditions, including if you: Have lung or breathing problems  Have liver problems  Have kidney problems  Are pregnant or plan to become pregnant. LYNPARZA can harm your unborn baby and may cause loss of pregnancy (miscarriage)  If you are able to become pregnant, your health care provider may do a pregnancy test before you start treatment with [facility]  Females who are able to become pregnant should use effective birth control (contraception) during treatment with [facility] and for 6 months after receiving the last dose of LYNPARZA  Talk to your health care provider about birth control methods that may be right for you  Tell your health care provider right away if you become pregnant Are breastfeeding or plan to breastfeed. It is not known if LYNPARZA passes into your breast milk. Do not breastfeed during treatment with LYNPARZA and for 1 month after receiving the last dose of LYNPARZA. Talk to your health care provider about the best way to feed your baby during this time    [place]: Information on look good feel better given.    EDUCATIONAL MATERIALS GIVEN AND REVIEWED: Reference sheet on lynparza.    SELF CARE ACTIVITIES WHILE ON CHEMOTHERAPY: Hydration Increase your fluid intake 48 hours prior to treatment and drink at least 8 to 12 cups (64 ounces) of water/decaff beverages per day after treatment. You can still have your cup of coffee or soda but these beverages do not count as part of your 8 to 12 cups that you need to drink daily. No alcohol intake.  Medications Continue taking your normal prescription medication as prescribed.  If  you start any new herbal or new supplements please let us know first to make sure it is safe.  Mouth Care Have teeth cleaned professionally before starting treatment. Keep dentures and partial plates clean. Use soft toothbrush and do not use mouthwashes that contain alcohol. Biotene is a good mouthwash that is available at most pharmacies or may be ordered by calling [id number]. Use warm salt water gargles (1 teaspoon salt per 1 quart warm water) before and after meals and at bedtime. Or you may rinse with 2 tablespoons of three-percent hydrogen peroxide mixed in eight ounces of water. If you are still having problems with your mouth or sores in your mouth please call the clinic. If you need dental work, please let Dr. Whitney Muse know before you go for your appointment so that we can coordinate the best possible time for you in regards to your chemo regimen. You need to also let your dentist know that you are actively taking chemo. We may need to do labs prior to your dental appointment.   Skin Care Always use sunscreen that has not expired and with SPF (Sun Protection Factor) of 50 or higher. Wear hats to  protect your head from the sun. Remember to use sunscreen on your hands, ears, face, & feet.  Use good moisturizing lotions such as udder cream, eucerin, or even Vaseline. Some chemotherapies can cause dry skin, color changes in your skin and nails.    . Avoid long, hot showers or baths. . Use gentle, fragrance-free soaps and laundry detergent. . Use moisturizers, preferably creams or ointments rather than lotions because the thicker consistency is better at preventing skin dehydration. Apply the cream or ointment within 15 minutes of showering. Reapply moisturizer at night, and moisturize your hands every time after you wash them.  Hair Loss (if your doctor says your hair will fall out)  . If your doctor says that your hair is likely to fall out, decide before you begin chemo whether you want to  wear a wig. You may want to shop before treatment to match your hair color. . Hats, turbans, and scarves can also camouflage hair loss, although some people prefer to leave their heads uncovered. If you go bare-headed outdoors, be sure to use sunscreen on your scalp. . Cut your hair short. It eases the inconvenience of shedding lots of hair, but it also can reduce the emotional impact of watching your hair fall out. . Don't perm or color your hair during chemotherapy. Those chemical treatments are already damaging to hair and can enhance hair loss. Once your chemo treatments are done and your hair has grown back, it's OK to resume dyeing or perming hair. With chemotherapy, hair loss is almost always temporary. But when it grows back, it may be a different color or texture. In older adults who still had hair color before chemotherapy, the new growth may be completely gray.  Often, new hair is very fine and soft.  Infection Prevention Please wash your hands for at least 30 seconds using warm soapy water. Handwashing is the #1 way to prevent the spread of germs. Stay away from sick people or people who are getting over a cold. If you develop respiratory systems such as green/yellow mucus production or productive cough or persistent cough let us know and we will see if you need an antibiotic. It is a good idea to keep a pair of gloves on when going into grocery stores/[employer] to decrease your risk of coming into contact with germs on the carts, etc. Carry alcohol hand gel with you at all times and use it frequently if out in public. If your temperature reaches 100.5 or higher please call the clinic and let us know.  If it is after hours or on the weekend please go to the ER if your temperature is over 100.5.  Please have your own personal thermometer at home to use.    Sex and bodily fluids If you are going to have sex, a condom must be used to protect the person that isn't taking chemotherapy. Chemo can  decrease your libido (sex drive). For a few days after chemotherapy, chemotherapy can be excreted through your bodily fluids.  When using the toilet please close the lid and flush the toilet twice.  Do this for a few day after you have had chemotherapy.     Effects of chemotherapy on your sex life Some changes are simple and won't last long. They won't affect your sex life permanently. Sometimes you may feel: . too tired . not strong enough to be very active . sick or sore  . not in the mood . anxious or low Your anxiety  might not seem related to sex. For example, you may be worried about the cancer and how your treatment is going. Or you may be worried about money, or about how you family are coping with your illness. These things can cause stress, which can affect your interest in sex. It's important to talk to your partner about how you feel. Remember - the changes to your sex life don't usually last long. There's usually no medical reason to stop having sex during chemo. The drugs won't have any long term physical effects on your performance or enjoyment of sex. Cancer can't be passed on to your partner during sex  Contraception It's important to use reliable contraception during treatment. Avoid getting pregnant while you or your partner are having chemotherapy. This is because the drugs may harm the baby. Sometimes chemotherapy drugs can leave a man or woman infertile.  This means you would not be able to have children in the future. You might want to talk to someone about permanent infertility. It can be very difficult to learn that you may no longer be able to have children. Some people find counselling helpful. There might be ways to preserve your fertility, although this is easier for men than for women. You may want to speak to a fertility expert. You can talk about sperm banking or harvesting your eggs. You can also ask about other fertility options, such as donor eggs. If you have or  have had breast cancer, your doctor might advise you not to take the contraceptive pill. This is because the hormones in it might affect the cancer.  It is not known for sure whether or not chemotherapy drugs can be passed on through semen or secretions from the vagina. Because of this some doctors advise people to use a barrier method (such as condoms, femidoms or dental dams) if you have sex during treatment. This applies to vaginal, anal or oral sex. Generally, doctors advise a barrier method only for the time you are actually having the treatment and for about a week after your treatment. Advice like this can be worrying, but this does not mean that you have to avoid being intimate with your partner. You can still have close contact with your partner and continue to enjoy sex.  Animals If you have cats or birds we just ask that you not change the litter or change the cage.  Please have someone else do this for you while you are on chemotherapy.   Food Safety During and After Cancer Treatment Food safety is important for people both during and after cancer treatment. Cancer and cancer treatments, such as chemotherapy, radiation therapy, and stem cell/bone marrow transplantation, often weaken the immune system. This makes it harder for your body to protect itself from foodborne illness, also called food poisoning. Foodborne illness is caused by eating food that contains harmful bacteria, parasites, or viruses.  Foods to avoid Some foods have a higher risk of becoming tainted with bacteria. These include: Marland Kitchen Unwashed fresh fruit and vegetables, especially leafy vegetables that can hide dirt and other contaminants . Raw sprouts, such as alfalfa sprouts . Raw or undercooked beef, especially ground beef, or other raw or undercooked meat and poultry . Cold hot dogs or deli lunch meat (cold cuts), including dry-cured, uncooked salami. Always cook or reheat these foods until they are steaming  hot. . Fatty, fried, or spicy foods immediately before or after treatment.  These can sit heavy on your stomach and make you  feel nauseous. . Raw or undercooked shellfish, such as oysters. . Sushi and sashimi, which often contain raw fish.  . Unpasteurized beverages, such as unpasteurized fruit juices, raw milk, raw yogurt, or cider . Soft cheeses made from unpasteurized milk, such as blue-veined (a type of blue cheese), Brie, Camembert, feta, goat cheese, and queso fresco or blanco . Undercooked eggs, such as soft boiled, over easy, and poached; raw, unpasteurized eggs; or foods made with raw egg, such as homemade raw cookie dough and homemade mayonnaise . Deli-prepared salads with egg, ham, chicken, or seafood Simple steps for food safety Shop smart. . Do not buy food stored or displayed in an unclean area. . Do not buy bruised or damaged fruits or vegetables. . Do not buy cans that have cracks, dents, or bulges. . Pick up foods that can spoil at the end of your shopping trip and store them in a cooler on the way home. Prepare and clean up foods carefully. . Rinse all fresh fruits and vegetables under running water, and dry them with a clean towel or paper towel. . Clean the top of cans before opening them. . After preparing food, wash your hands for 20 seconds with hot water and soap. Pay special attention to areas between fingers and under nails. . Clean your utensils and dishes with hot water and soap. Marland Kitchen Disinfect your kitchen and cutting boards using 1 teaspoon of liquid, unscented bleach mixed into 1 quart of water.   Prevent cross-contamination. Mar[place] Kitchen Keep raw meat, poultry, and fish or their juices away from other food. Bacteria can spread through contact with the food or its liquid, causing cross-contamination. . Do not rinse raw meat or poultry because it can spread bacteria to nearby surfaces. Wendee Copp all items you used for preparing raw foods, including utensils, cutting board, and  plates, before using them for other foods or cooked meat. . Set aside a specific cutting board for preparing uncooked meat, fish, and chicken. Never use it for uncooked fruits, vegetables, or other foods. Dispose of old food. . Eat canned and packaged food before its expiration date (the "use by" or "best before" date). . Consume refrigerated leftovers within 3 to 4 days. After that time, throw out the food. Even if the food does not smell or look spoiled, it still may be unsafe. Some bacteria, such as Listeria, can grow even on foods stored in the refrigerator if they are kept for too long. Take precautions when eating out. . At restaurants, avoid buffets and salad bars where food sits out for a long time and comes in contact with many people. Food can become contaminated when someone with a virus, often a norovirus, or another "bug" handles it. . Put any leftover food in a "to-go" container yourself, rather than having the server do it. And, refrigerate leftovers as soon as you get home. . Choose restaurants that are clean and that are willing to prepare your food as you order it cooked. Cook food to the right temperature. Use a food thermometer to check for a safe internal temperature of all poultry and meat. For instance, a hamburger should be cooked to at least medium (160?F or 71?C). Get a full list of recommended internal cooking temperatures on the website of the U.S. Food and Drug Administration (FDA).  Chill food promptly. Refrigerate or freeze perishable food within 2 hours of cooking or buying it (sooner in warm weather.) Proper cooking destroys bacteria, but they can still grow on cooked  food that is left out too long. Food stored in the refrigerator should be kept at below 40?F (4?C). And, food stored in the freezer should be kept below 32?F (0?C). Thaw food properly. Thaw frozen food in the refrigerator rather than at room temperature. You can also thaw food in frequently changed cold  water or in the microwave, but cook it as soon as it thaws.    MEDICATIONS:  Zofran/Ondansetron '8mg'$  tablet. Take 1 tablet every 8 hours as needed for nausea/vomiting. (#1 nausea med to take, this can constipate)  Compazine/Prochlorperazine '10mg'$  tablet. Take 1 tablet every 6 hours as needed for nausea/vomiting. (#2 nausea med to take, this can make you sleepy)   EMLA cream. Apply a quarter size amount to port site 1 hour prior to chemo. Do not rub in. Cover with plastic wrap.   Over-the-Counter Meds:  Miralax 1 capful in 8 oz of fluid daily. May increase to two times a day if needed. This is a stool softener. If this doesn't work proceed you can add:  Senokot S-start with 1 tablet two times a day and increase to 4 tablets two times a day if needed. (total of 8 tablets in a 24 hour period). This is a stimulant laxative.   Call us if this does not help your bowels move.   Imodium '2mg'$  capsule. Take 2 capsules after the 1st loose stool and then 1 capsule every 2 hours until you go a total of 12 hours without having a loose stool. Call the [place] if loose stools continue. If diarrhea occurs @ bedtime, take 2 capsules @ bedtime. Then take 2 capsules every 4 hours until morning. Call [place].     Diarrhea Sheet  If you are having loose stools/diarrhea, please purchase Imodium and begin taking as outlined:  At the first sign of poorly formed or loose stools you should begin taking Imodium(loperamide) 2 mg capsules.  Take two caplets ('4mg'$ ) followed by one caplet ('2mg'$ ) every 2 hours until you have had no diarrhea for 12 hours.  During the night take two caplets ('4mg'$ ) at bedtime and continue every 4 hours during the night until the morning.  Stop taking Imodium only after there is no sign of diarrhea for 12 hours.    Always call the [place] if you are having loose stools/diarrhea that you can't get under control.  Loose stools/disrrhea leads to dehydration (loss of water) in your  body.  We have other options of trying to get the loose stools/diarrhea to stopped but you must let us know!     Constipation Sheet *Miralax in 8 oz of fluid daily.  May increase to two times a day if needed.  This is a stool softener.  If this not enough to keep your bowel regular:  You can add:  *Senokot S, start with one tablet twice a day and can increase to 4 tablets twice a day if needed.  This is a stimulant laxative.   Sometimes when you take pain medication you need BOTH a medicine to keep your stool soft and a medicine to help your bowel push it out!  Please call if the above does not work for you.   Do not go more than 2 days without a bowel movement.  It is very important that you do not become constipated.  It will make you feel sick to your stomach (nausea) and can cause abdominal pain and vomiting.      Nausea Sheet  Zofran/Ondansetron  $'8mg'q$  tablet. Take 1 tablet every 8 hours as needed for nausea/vomiting. (#1 nausea med to take, this can constipate)  Compazine/Prochlorperazine '10mg'$  tablet. Take 1 tablet every 6 hours as needed for nausea/vomiting. (#2 nausea med to take, this can make you sleepy)  You can take these medications together or separately.  We would first like for you to try the Ondansetron by itself and then take the Prochloperizine if needed. But you are allowed to take both medications at the same time if your nausea is that severe.  If you are having persistent nausea (nausea that does not stop) please take these medications on a staggered schedule so that the nausea medication stays in your body.  Please call the [place] and let us know the amount of nausea that you are experiencing.  If you begin to vomit, you need to call the [place] and if it is the weekend and you have vomited more than one time and cant get it to stop-go to the Emergency Room.  Persistent nausea/vomiting can lead to dehydration (loss of fluid in your body) and will make you  feel terrible.   Ice chips, sips of clear liquids, foods that are @ room temperature, crackers, and toast tend to be better tolerated.     SYMPTOMS TO REPORT AS SOON AS POSSIBLE AFTER TREATMENT:  FEVER GREATER THAN 100.5 F  CHILLS WITH OR WITHOUT FEVER  NAUSEA AND VOMITING THAT IS NOT CONTROLLED WITH YOUR NAUSEA MEDICATION  UNUSUAL SHORTNESS OF BREATH  UNUSUAL BRUISING OR BLEEDING  TENDERNESS IN MOUTH AND THROAT WITH OR WITHOUT PRESENCE OF ULCERS  URINARY PROBLEMS  BOWEL PROBLEMS  UNUSUAL RASH    Wear comfortable clothing and clothing appropriate for easy access to any Portacath or PICC line. Let us know if there is anything that we can do to make your therapy better!     What to do if you need assistance after hours or on the weekends: CALL [phone].  HOLD on the line, do not hang up.  You will hear multiple messages but at the end you will be connected with a nurse triage line.  They will contact Dr Whitney Muse if necessary.  Most of the time they will be able to assist you.    Do not call the hospital operator.  Dr Whitney Muse will not answer phone calls received by them.      I have been informed and understand all of the instructions given to me and have received a copy. I have been instructed to call the clinic [phone]  or my family physician as soon as possible for continued medical care, if indicated. I do not have any more questions at this time but understand that I may call the [place] at [phone] during office hours should I have questions or need assistance in obtaining follow-up care.

## 2016-05-05 ENCOUNTER — Encounter (HOSPITAL_BASED_OUTPATIENT_CLINIC_OR_DEPARTMENT_OTHER): Admitting: Oncology

## 2016-05-05 ENCOUNTER — Encounter (HOSPITAL_COMMUNITY): Payer: Self-pay | Admitting: Oncology

## 2016-05-05 ENCOUNTER — Encounter (HOSPITAL_BASED_OUTPATIENT_CLINIC_OR_DEPARTMENT_OTHER)

## 2016-05-05 VITALS — BP 156/95 | HR 99 | Temp 98.1°F | Resp 18 | Ht 64.0 in | Wt 217.0 lb

## 2016-05-05 DIAGNOSIS — C787 Secondary malignant neoplasm of liver and intrahepatic bile duct: Secondary | ICD-10-CM

## 2016-05-05 DIAGNOSIS — C189 Malignant neoplasm of colon, unspecified: Secondary | ICD-10-CM

## 2016-05-05 DIAGNOSIS — C7800 Secondary malignant neoplasm of unspecified lung: Secondary | ICD-10-CM

## 2016-05-05 DIAGNOSIS — C160 Malignant neoplasm of cardia: Secondary | ICD-10-CM | POA: Diagnosis not present

## 2016-05-05 DIAGNOSIS — Z95828 Presence of other vascular implants and grafts: Secondary | ICD-10-CM

## 2016-05-05 LAB — CBC WITH DIFFERENTIAL/PLATELET
BASOS ABS: 0 10*3/uL (ref 0.0–0.1)
Basophils Relative: 0 %
EOS ABS: 0 10*3/uL (ref 0.0–0.7)
Eosinophils Relative: 0 %
HCT: 32 % — ABNORMAL LOW (ref 36.0–46.0)
Hemoglobin: 10.2 g/dL — ABNORMAL LOW (ref 12.0–15.0)
LYMPHS ABS: 1.9 10*3/uL (ref 0.7–4.0)
Lymphocytes Relative: 8 %
MCH: 27.1 pg (ref 26.0–34.0)
MCHC: 31.9 g/dL (ref 30.0–36.0)
MCV: 85.1 fL (ref 78.0–100.0)
MONO ABS: 1.4 10*3/uL — AB (ref 0.1–1.0)
MONOS PCT: 6 %
NEUTROS ABS: 20.2 10*3/uL — AB (ref 1.7–7.7)
Neutrophils Relative %: 86 %
PLATELETS: 389 10*3/uL (ref 150–400)
RBC: 3.76 MIL/uL — ABNORMAL LOW (ref 3.87–5.11)
RDW: 21.2 % — ABNORMAL HIGH (ref 11.5–15.5)
WBC: 23.5 10*3/uL — AB (ref 4.0–10.5)

## 2016-05-05 LAB — COMPREHENSIVE METABOLIC PANEL
ALT: 95 U/L — AB (ref 14–54)
ANION GAP: 11 (ref 5–15)
AST: 112 U/L — ABNORMAL HIGH (ref 15–41)
Albumin: 2.5 g/dL — ABNORMAL LOW (ref 3.5–5.0)
Alkaline Phosphatase: 950 U/L — ABNORMAL HIGH (ref 38–126)
BUN: 12 mg/dL (ref 6–20)
CALCIUM: 8.6 mg/dL — AB (ref 8.9–10.3)
CHLORIDE: 92 mmol/L — AB (ref 101–111)
CO2: 25 mmol/L (ref 22–32)
CREATININE: 0.5 mg/dL (ref 0.44–1.00)
Glucose, Bld: 258 mg/dL — ABNORMAL HIGH (ref 65–99)
Potassium: 3.9 mmol/L (ref 3.5–5.1)
Sodium: 128 mmol/L — ABNORMAL LOW (ref 135–145)
Total Bilirubin: 2.8 mg/dL — ABNORMAL HIGH (ref 0.3–1.2)
Total Protein: 6.5 g/dL (ref 6.5–8.1)

## 2016-05-05 LAB — MAGNESIUM: MAGNESIUM: 1.8 mg/dL (ref 1.7–2.4)

## 2016-05-05 MED ORDER — OLAPARIB 100 MG PO TABS: 300.0000 mg | ORAL_TABLET | Freq: Two times a day (BID) | ORAL | 3 refills | Status: AC

## 2016-05-05 MED ORDER — SODIUM CHLORIDE 0.9% FLUSH
20.0000 mL | INTRAVENOUS | Status: DC | PRN
  Administered 2016-05-05: 20 mL via INTRAVENOUS
  Filled 2016-05-05: qty 20

## 2016-05-05 MED ORDER — HEPARIN SOD (PORK) LOCK FLUSH 100 UNIT/ML IV SOLN
500.0000 [IU] | Freq: Once | INTRAVENOUS | Status: AC
Start: 2016-05-05 — End: 2016-05-05
  Administered 2016-05-05: 500 [IU] via INTRAVENOUS

## 2016-05-05 MED ORDER — HEPARIN SOD (PORK) LOCK FLUSH 100 UNIT/ML IV SOLN
INTRAVENOUS | Status: AC
  Filled 2016-05-05: qty 5

## 2016-05-05 NOTE — Patient Instructions (Signed)
[place] at [facility] Discharge Instructions  RECOMMENDATIONS MADE BY THE CONSULTANT AND ANY TEST RESULTS WILL BE SENT TO YOUR REFERRING PHYSICIAN.  Port flushed with labs drawn per protocol. Follow-up as scheduled. Call clinic for any questions or concerns  Thank you for choosing [place] at [facility] to provide your oncology and hematology care.  To afford each patient quality time with our provider, please arrive at least 15 minutes before your scheduled appointment time.   Beginning January 23rd 2017 lab work for the [name] will be done in the  Main lab at [facility] on 1st floor. If you have a lab appointment with the [place] please come in thru the  Main Entrance and check in at the main information desk  You need to re-schedule your appointment should you arrive 10 or more minutes late.  We strive to give you quality time with our providers, and arriving late affects you and other patients whose appointments are after yours.  Also, if you no show three or more times for appointments you may be dismissed from the clinic at the providers discretion.     Again, thank you for choosing [facility].  Our hope is that these requests will decrease the amount of time that you wait before being seen by our physicians.       _____________________________________________________________  Should you have questions after your visit to [facility], please contact our office at [phone] between the hours of 8:30 a.m. and 4:30 p.m.  Voicemails left after 4:30 p.m. will not be returned until the following business day.  For prescription refill requests, have your pharmacy contact our office.         Resources For Cancer Patients and their Caregivers ? American Cancer Society: Can assist with transportation, wigs, general needs, runs Look Good Feel Better.        ([phone] ? Cancer Care: Provides  financial assistance, online support groups, medication/co-pay assistance.  1-800-813-HOPE 978-419-1780) ? [place] Assists [place] cancer patients and their families through emotional , educational and financial support.  323-631-8284 ? [place] DSS Where to apply for food stamps, Medicaid and utility assistance. ([phone] ? RCATS: Transportation to medical appointments. 260-822-9097 ? Social Security Administration: May apply for disability if have a Stage IV cancer. 817-807-6266 810-395-1320 ? [employer], Disability and Transit Services: Assists with nutrition, care and transit needs. [place] Support Programs: '@10RELATIVEDAYS'$ @ > Cancer Support Group  2nd Tuesday of the month 1pm-2pm, Journey Room  > Creative Journey  3rd Tuesday of the month 1130am-1pm, Journey Room  > Look Good Feel Better  1st Wednesday of the month 10am-12 noon, Journey Room (Call [place] to register [phone])

## 2016-05-05 NOTE — Progress Notes (Signed)
Erica Barker tolerated port lab draw well without complaints or incident. Port flushed with 20 ml NS and 5 ml Heparin easily after blood drawn for labs per protocol. Pt discharged via wheelchair in satisfactory condition with her mother

## 2016-05-05 NOTE — Progress Notes (Signed)
Went in follow up appt today to check on pt.  Pt has been taking lynparza for little over a week.  Pt as been taking medication as prescribed twice a day.  Pt has seemed to do well on the drug.  Pt is having some tiredness and weakness.  Also complains of being off-balance.  Kirby Crigler PA notified.

## 2016-05-05 NOTE — Progress Notes (Signed)
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  INTERVAL HISTORY: ERICHA Barker 55 y.o. female returns for followup of Stage IV colon cancer, KRAS mutated, with BRIP1 mutation on [employer] testing.    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 [facility])   02/20/2014 Imaging    CT of abdomen and pelvis on 02/20/2014 at [facility] showing 3.6 cm apple core lesion in the proximal sigmoid colon 11 mm hypoenhancing lesion in the medial left hepatic dome, 4.1 cm right lower lobe mass and adjacent right lower lobe nodule      02/23/2014 Pathology Results    KRAS MUTATED      04/17/2014 Imaging    Right upper extremity DVT 04/17/2014 paired brachial veins, axillary vein, and peripheral aspect of the right subclavian vein      07/06/2014 Imaging    CT chest 07/06/2014 with bilateral pulmonary nodules and masses, RUL, lateral RUL, posterior LUL, lobulated nodule in the subpleural RLL      08/18/2014 Tumor Marker    CEA 93.7 ng/ml      10/09/2014 Initial Diagnosis    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      02/19/2015 Imaging    Progressive pulmonary metastatic disease as detailed above. Stable mediastinal lymphadenopathy. No abdominal/pelvic metastatic disease is identified      04/24/2015 - 08/07/2015 Chemotherapy    XELIRI started on 04/24/2015. Patient did not want to wear 5-FU pump.  On 2000 mg BID 14 days on and 7 days off.      04/28/2015 Adverse Reaction    Mild stomatitis.  Xeloda held.  Treated with Magic Mouthwash.  Resolved by 05/02/2015 at office visit.      05/15/2015 Treatment Plan Change   Will restart Xeloda at 1500 mg BID 14 days on and 7 days off (11/1)      07/17/2015 Adverse Reaction    Mouth sores      07/17/2015 Treatment Plan Change    Change in Xeloda frequency: 1500 mg BID 7 days on and 7 days off.      07/24/2015 Adverse Reaction    Increased nausea/vomiting      08/07/2015 Treatment Plan Change    Irinotecan dose reduced by 10%      08/10/2015 Imaging    MRI brain- Scattered punctate subcortical T2 hyperintensities bilaterally are slightly greater than expected for age. The finding is nonspecific but can be seen in the setting of chronic microvascular ischemia, a demyelinating process such as multiple...      09/03/2015 Progression    CT CAP- demonstrates new hepatic lesions      09/03/2015 Imaging    CT CAP- Multiple new hepatic metastasis in the R hepatic lobe corresponds to subtle hypodensities identified on comparison exam.  Stable metastatic bilateral pulmonary nodules and masses. Small mesenteric lymph nodes along the sigmoid colon are unchanged      09/24/2015 -  Chemotherapy    Stivarga 160 mg daily 21/28 days      12/23/2015 - 12/25/2015 Hospital Admission    Rectal Bleeding, RUQ pain  12/24/2015 Imaging    CT chest- Significant progression of multiple B/L pulmonary metastases. Mild progression of mediastinal lymphadenopathy. Significant progression of multiple hepatic metastases. Mass in the sigmoid colon, increasing in prominence, concerning for progressio      12/24/2015 Imaging    CT head- No acute intracranial abnormalities. No significant mass effect or focal lesion identified.      12/24/2015 Progression    CT scans demonstrate progression of disease.      12/31/2015 Miscellaneous    Consult at [facility] for consideration of clinical trial.  Seen by Dr. Teryl Lucy.      02/04/2016 Procedure    US guided core biopsy of right hepatic lobe lesion by Dr. Anselm Pancoast.  4 cores obtained.      02/05/2016 Pathology Results    Liver,  needle/core biopsy, Right Hepatic Lobe - METASTATIC ADENOCARCINOMA, CONSISTENT WITH COLONIC PRIMARY.  Sent for [employer] testing.      03/30/2016 Miscellaneous    [facility] consultation       04/27/2016 -  Chemotherapy    Erica Barker       She denies any issues associated with known side effects of Lynparza as they were reviewed.  HOWEVER, she does have some B/L LE edema which can by from this medication, but she notes that it is not bothersome for her.  She reports that her RUQ abdominal pain is resolved with Dexamethasone.  She notes issues with her balance.  She denies any falls.  She notes dizziness and the feeling of impending fall.  She reports that it occurs when she climbs the stairs.    She otherwise denies any complaints.  Her mother accompanies her and she thinks her eye color is stable.  Review of Systems  Constitutional: Positive for weight loss. Negative for chills and fever.  HENT: Negative.   Eyes: Negative.   Respiratory: Negative.   Cardiovascular: Negative.  Negative for chest pain.  Gastrointestinal: Negative.   Genitourinary: Negative.   Musculoskeletal: Negative.  Negative for falls.  Skin: Negative.  Negative for rash.  Neurological: Positive for dizziness. Negative for loss of consciousness.  Endo/Heme/Allergies: Negative.   Psychiatric/Behavioral:       Denial of disease    Past Medical History:  Diagnosis Date  . Cancer ([place])    stage 4 colon with mets to liver and lung  . Depression   . Diabetes mellitus, type II ([place])   . Headache(784.0)   . Hypertension   . Lymphadenopathy, mediastinal 01/2014   CTA ANGIO .[place]  . Medically noncompliant 09/06/2015  . Morbid obesity ([place])   . Pulmonary nodules 01/30/14   CTA CHEST    Past Surgical History:  Procedure Laterality Date  . CHOLECYSTECTOMY    . TUBAL LIGATION    . VIDEO BRONCHOSCOPY WITH ENDOBRONCHIAL ULTRASOUND N/A 02/06/2014   Procedure: VIDEO BRONCHOSCOPY WITH ENDOBRONCHIAL  ULTRASOUND,bronchial biopsies , node sampling;  Surgeon: Melrose Nakayama, MD;  Location: [facility] OR;  Service: Thoracic;  Laterality: N/A;    Family History  Problem Relation Age of Onset  . Cancer Mother     UTERINE  . Cancer Father     LEUKEMIA  . Crohn's disease Son   . Colon cancer Neg Hx     Social History   Social History  . Marital status: Widowed    Spouse name: N/A  . Number of children: N/A  . Years of education: N/A   Social History Main Topics  . Smoking status: Former Smoker  Packs/day: 0.50    Years: 7.00    Types: Cigarettes    Quit date: 02/03/1989  . Smokeless tobacco: Never Used  . Alcohol use Yes     Comment: occasional  . Drug use: No  . Sexual activity: Not Asked   Other Topics Concern  . None   Social History Narrative  . None     PHYSICAL EXAMINATION  ECOG PERFORMANCE STATUS: 2 - Symptomatic, <50% confined to bed  Vitals:   05/05/16 0900  BP: (!) 156/95  Pulse: 99  Resp: 18  Temp: 98.1 F (36.7 C)    GENERAL:alert, comfortable, cooperative, ill looking, obese, smiling and in wheelchair, accompanied by her mother from [place], [place]. SKIN: skin color, texture, turgor are normal HEAD: Normocephalic, No masses, lesions, tenderness or abnormalities EYES: scleral icterus EARS: External ears normal OROPHARYNX:lips, buccal mucosa, and tongue normal  NECK: supple, trachea midline LYMPH:  no palpable lymphadenopathy BREAST:not examined LUNGS: clear to auscultation  HEART: regular rate & rhythm ABDOMEN:abdomen soft, non-tender, obese and normal bowel sounds BACK: Back symmetric, no curvature. EXTREMITIES :positive findings:  edema B/L LE edema, 1+ pitting.  No clonus of hand/wrist  NEURO: alert & oriented x 3 with fluent speech, no focal motor/sensory deficits, in wheelchair.   LABORATORY DATA: CBC    Component Value Date/Time   WBC 23.5 (H) 05/05/2016 1002   RBC 3.76 (L) 05/05/2016 1002   HGB 10.2 (L) 05/05/2016 1002   HCT  32.0 (L) 05/05/2016 1002   PLT 389 05/05/2016 1002   MCV 85.1 05/05/2016 1002   MCH 27.1 05/05/2016 1002   MCHC 31.9 05/05/2016 1002   RDW 21.2 (H) 05/05/2016 1002   LYMPHSABS 1.9 05/05/2016 1002   MONOABS 1.4 (H) 05/05/2016 1002   EOSABS 0.0 05/05/2016 1002   BASOSABS 0.0 05/05/2016 1002      Chemistry      Component Value Date/Time   NA 128 (L) 05/05/2016 1002   K 3.9 05/05/2016 1002   CL 92 (L) 05/05/2016 1002   CO2 25 05/05/2016 1002   BUN 12 05/05/2016 1002   CREATININE 0.50 05/05/2016 1002      Component Value Date/Time   CALCIUM 8.6 (L) 05/05/2016 1002   ALKPHOS 950 (H) 05/05/2016 1002   AST 112 (H) 05/05/2016 1002   ALT 95 (H) 05/05/2016 1002   BILITOT 2.8 (H) 05/05/2016 1002        PENDING LABS:   RADIOGRAPHIC STUDIES:  Dg Chest Port 1 View  Result Date: 04/22/2016 CLINICAL DATA:  Shortness of breath in patient with metastatic colon cancer. EXAM: PORTABLE CHEST 1 VIEW COMPARISON:  Single-view of the chest 02/18/2016. FINDINGS: Lung volumes are low. Bilateral pulmonary nodules and masses consistent with metastatic disease are again seen. No consolidative process, pneumothorax or effusion. Heart size is normal. IMPRESSION: No acute disease in patient with bilateral pulmonary metastasis from colon carcinoma. Electronically Signed   By: Inge Rise M.D.   On: 04/22/2016 13:53   Dg Abd Portable 1v  Result Date: 04/22/2016 CLINICAL DATA:  Generalized abdominal pain. EXAM: PORTABLE ABDOMEN - 1 VIEW COMPARISON:  Radiographs of February 18, 2016. FINDINGS: The bowel gas pattern is normal. Phleboliths are noted in the pelvis. No renal calculi are noted. IMPRESSION: No evidence of bowel obstruction or ileus. Electronically Signed   By: Marijo Conception, M.D.   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  Currently on salvage  Lynparza 300 mg PO BID beginning on 04/27/2016.  Oncology history is updated.  Labs today: CBC diff, CMET, Magnesium, CEA.  I personally reviewed and went over laboratory results with the patient.  The results are noted within this dictation.  Labs in 2-3 weeks: CBC diff, CMET, CEA.  I have recommended LE elevation for her edema.  She notes issues with balance.  Order is placed for MRI of brain w and wo contrast to evaluate for metastatic disease.  Regarding her Dexamethasone, she is currently on BID dosing.  She is advised that she can decreased to daily dosing and see if her symptom remains controlled.  I have left the decision to her.  Otherwise, she can continue with BID dosing at this juncture as it may be palliative (appetite stimulant, provide energy, etc).  Return in 2-3 weeks for follow-up.   ORDERS PLACED FOR THIS ENCOUNTER: Orders Placed This Encounter  Procedures  . MR Brain W Wo Contrast  . CBC with Differential  . Comprehensive metabolic panel  . CEA    MEDICATIONS PRESCRIBED THIS ENCOUNTER: Meds ordered this encounter  Medications  . Olaparib 100 MG TABS    Sig: Take 300 mg by mouth 2 (two) times daily.    Dispense:  180 tablet    Refill:  3    Order Specific Question:   Supervising Provider    Answer:   Patrici Ranks [id number]    THERAPY PLAN:  Lynparza 300 mg PO BID  All questions were answered. The patient knows to call the clinic with any problems, questions or concerns. We can certainly see the patient much sooner if necessary.  Patient and plan discussed with Dr. Ancil Linsey and she is in agreement with the aforementioned.   This note is electronically signed by: Doy Mince 05/05/2016 5:09 PM

## 2016-05-05 NOTE — Assessment & Plan Note (Addendum)
Stage IV colon cancer, KRAS mutated, BRIP1 mutation on [employer] testing.  Currently on salvage Lynparza 300 mg PO BID beginning on 04/27/2016.  Oncology history is updated.  Labs today: CBC diff, CMET, Magnesium, CEA.  I personally reviewed and went over laboratory results with the patient.  The results are noted within this dictation.  Labs in 2-3 weeks: CBC diff, CMET, CEA.  I have recommended LE elevation for her edema.  She notes issues with balance.  Order is placed for MRI of brain w and wo contrast to evaluate for metastatic disease.  Regarding her Dexamethasone, she is currently on BID dosing.  She is advised that she can decreased to daily dosing and see if her symptom remains controlled.  I have left the decision to her.  Otherwise, she can continue with BID dosing at this juncture as it may be palliative (appetite stimulant, provide energy, etc).  Return in 2-3 weeks for follow-up.

## 2016-05-05 NOTE — Patient Instructions (Signed)
[place] at [facility] Discharge Instructions  RECOMMENDATIONS MADE BY THE CONSULTANT AND ANY TEST RESULTS WILL BE SENT TO YOUR REFERRING PHYSICIAN.  You were seen today by Kirby Crigler PA-C. Return for follow up and labs in 2-3 weeks. MRI brain in around a week.   Thank you for choosing [place] at [facility] to provide your oncology and hematology care.  To afford each patient quality time with our provider, please arrive at least 15 minutes before your scheduled appointment time.   Beginning January 23rd 2017 lab work for the [name] will be done in the  Main lab at [facility] on 1st floor. If you have a lab appointment with the [place] please come in thru the  Main Entrance and check in at the main information desk  You need to re-schedule your appointment should you arrive 10 or more minutes late.  We strive to give you quality time with our providers, and arriving late affects you and other patients whose appointments are after yours.  Also, if you no show three or more times for appointments you may be dismissed from the clinic at the providers discretion.     Again, thank you for choosing [facility].  Our hope is that these requests will decrease the amount of time that you wait before being seen by our physicians.       _____________________________________________________________  Should you have questions after your visit to [facility], please contact our office at [phone] between the hours of 8:30 a.m. and 4:30 p.m.  Voicemails left after 4:30 p.m. will not be returned until the following business day.  For prescription refill requests, have your pharmacy contact our office.         Resources For Cancer Patients and their Caregivers ? American Cancer Society: Can assist with transportation, wigs, general needs, runs Look Good Feel Better.        ([phone] ? Cancer  Care: Provides financial assistance, online support groups, medication/co-pay assistance.  1-800-813-HOPE (903)256-6525) ? [place] Assists [place] cancer patients and their families through emotional , educational and financial support.  ([phone] ? [place] DSS Where to apply for food stamps, Medicaid and utility assistance. 504-005-0700 ? RCATS: Transportation to medical appointments. [phone] ? Social Security Administration: May apply for disability if have a Stage IV cancer. [phone] ? [employer], Disability and Transit Services: Assists with nutrition, care and transit needs. [place] Support Programs: '@10RELATIVEDAYS'$ @ > Cancer Support Group  2nd Tuesday of the month 1pm-2pm, Journey Room  > Creative Journey  3rd Tuesday of the month 1130am-1pm, Journey Room  > Look Good Feel Better  1st Wednesday of the month 10am-12 noon, Journey Room (Call [place] to register [phone])

## 2016-05-06 LAB — CEA: CEA: 6541 ng/mL — ABNORMAL HIGH (ref 0.0–4.7)

## 2016-05-09 ENCOUNTER — Ambulatory Visit (HOSPITAL_COMMUNITY)
Admission: RE | Admit: 2016-05-09 | Discharge: 2016-05-09 | Disposition: A | Discharge status: Home / Self Care | Source: Ambulatory Visit | Attending: Oncology | Admitting: Oncology

## 2016-05-09 DIAGNOSIS — C7800 Secondary malignant neoplasm of unspecified lung: Secondary | ICD-10-CM | POA: Diagnosis present

## 2016-05-09 DIAGNOSIS — C189 Malignant neoplasm of colon, unspecified: Secondary | ICD-10-CM | POA: Diagnosis not present

## 2016-05-09 MED ORDER — GADOBENATE DIMEGLUMINE 529 MG/ML IV SOLN
20.0000 mL | Freq: Once | INTRAVENOUS | Status: AC | PRN
  Administered 2016-05-09: 19 mL via INTRAVENOUS

## 2016-05-22 ENCOUNTER — Encounter (HOSPITAL_COMMUNITY): Payer: Self-pay | Admitting: Lab

## 2016-05-22 ENCOUNTER — Encounter (HOSPITAL_COMMUNITY): Payer: Self-pay | Admitting: Hematology & Oncology

## 2016-05-22 ENCOUNTER — Other Ambulatory Visit (HOSPITAL_COMMUNITY)

## 2016-05-22 ENCOUNTER — Encounter (HOSPITAL_COMMUNITY): Payer: Medicare Other | Attending: Hematology & Oncology | Admitting: Hematology & Oncology

## 2016-05-22 ENCOUNTER — Encounter (HOSPITAL_COMMUNITY): Payer: Self-pay | Admitting: Emergency Medicine

## 2016-05-22 ENCOUNTER — Encounter (HOSPITAL_COMMUNITY)

## 2016-05-22 VITALS — BP 121/79 | HR 103 | Temp 98.7°F | Resp 20

## 2016-05-22 DIAGNOSIS — C7800 Secondary malignant neoplasm of unspecified lung: Secondary | ICD-10-CM

## 2016-05-22 DIAGNOSIS — Z95828 Presence of other vascular implants and grafts: Secondary | ICD-10-CM

## 2016-05-22 DIAGNOSIS — C189 Malignant neoplasm of colon, unspecified: Secondary | ICD-10-CM | POA: Diagnosis present

## 2016-05-22 DIAGNOSIS — C787 Secondary malignant neoplasm of liver and intrahepatic bile duct: Secondary | ICD-10-CM

## 2016-05-22 DIAGNOSIS — Z7189 Other specified counseling: Secondary | ICD-10-CM

## 2016-05-22 DIAGNOSIS — R17 Unspecified jaundice: Secondary | ICD-10-CM

## 2016-05-22 LAB — CBC WITH DIFFERENTIAL/PLATELET
Basophils Absolute: 0 10*3/uL (ref 0.0–0.1)
Basophils Relative: 0 %
EOS PCT: 0 %
Eosinophils Absolute: 0 10*3/uL (ref 0.0–0.7)
HEMATOCRIT: 31.7 % — AB (ref 36.0–46.0)
Hemoglobin: 10.5 g/dL — ABNORMAL LOW (ref 12.0–15.0)
LYMPHS ABS: 2.4 10*3/uL (ref 0.7–4.0)
Lymphocytes Relative: 16 %
MCH: 29.6 pg (ref 26.0–34.0)
MCHC: 33.1 g/dL (ref 30.0–36.0)
MCV: 89.3 fL (ref 78.0–100.0)
MONO ABS: 0.9 10*3/uL (ref 0.1–1.0)
Monocytes Relative: 6 %
Neutro Abs: 11.4 10*3/uL — ABNORMAL HIGH (ref 1.7–7.7)
Neutrophils Relative %: 78 %
Platelets: 338 10*3/uL (ref 150–400)
RBC: 3.55 MIL/uL — AB (ref 3.87–5.11)
RDW: 26.8 % — AB (ref 11.5–15.5)
WBC: 14.7 10*3/uL — AB (ref 4.0–10.5)

## 2016-05-22 LAB — COMPREHENSIVE METABOLIC PANEL
ALBUMIN: 2.5 g/dL — AB (ref 3.5–5.0)
ALT: 160 U/L — ABNORMAL HIGH (ref 14–54)
ANION GAP: 11 (ref 5–15)
AST: 158 U/L — ABNORMAL HIGH (ref 15–41)
Alkaline Phosphatase: 1450 U/L — ABNORMAL HIGH (ref 38–126)
BILIRUBIN TOTAL: 13.2 mg/dL — AB (ref 0.3–1.2)
BUN: 10 mg/dL (ref 6–20)
CO2: 23 mmol/L (ref 22–32)
Calcium: 9.3 mg/dL (ref 8.9–10.3)
Chloride: 93 mmol/L — ABNORMAL LOW (ref 101–111)
Creatinine, Ser: 0.34 mg/dL — ABNORMAL LOW (ref 0.44–1.00)
Glucose, Bld: 124 mg/dL — ABNORMAL HIGH (ref 65–99)
POTASSIUM: 4.6 mmol/L (ref 3.5–5.1)
Sodium: 127 mmol/L — ABNORMAL LOW (ref 135–145)
TOTAL PROTEIN: 6.2 g/dL — AB (ref 6.5–8.1)

## 2016-05-22 MED ORDER — OXYCODONE HCL 5 MG PO TABS: 5.0000 mg | ORAL_TABLET | ORAL | 0 refills | Status: AC | PRN

## 2016-05-22 MED ORDER — HEPARIN SOD (PORK) LOCK FLUSH 100 UNIT/ML IV SOLN
INTRAVENOUS | Status: AC
  Filled 2016-05-22: qty 5

## 2016-05-22 MED ORDER — OXYCODONE HCL ER 15 MG PO T12A: 15.0000 mg | EXTENDED_RELEASE_TABLET | Freq: Two times a day (BID) | ORAL | 0 refills | Status: AC

## 2016-05-22 MED ORDER — HEPARIN SOD (PORK) LOCK FLUSH 100 UNIT/ML IV SOLN
500.0000 [IU] | Freq: Once | INTRAVENOUS | Status: AC
Start: 2016-05-22 — End: 2016-05-22
  Administered 2016-05-22: 500 [IU] via INTRAVENOUS

## 2016-05-22 MED ORDER — SODIUM CHLORIDE 0.9% FLUSH
10.0000 mL | INTRAVENOUS | Status: DC | PRN
  Administered 2016-05-22: 10 mL via INTRAVENOUS
  Filled 2016-05-22: qty 10

## 2016-05-22 MED ORDER — SUCRALFATE 1 GM/10ML PO SUSP: 1.0000 g | Freq: Three times a day (TID) | ORAL | 1 refills | Status: AC

## 2016-05-22 NOTE — Progress Notes (Signed)
[facility] Progress Note  Patient Care Team: No Pcp Per Patient as PCP - General ([place]) Melrose Nakayama, MD as Consulting Physician (Cardiothoracic Surgery)  CHIEF COMPLAINTS/PURPOSE OF CONSULTATION:  Stage IV CRC CT of abdomen and pelvis on 02/20/2014 at [facility] showing 3.6 cm apple core lesion in the proximal sigmoid colon 11 mm hypoenhancing lesion in the medial left hepatic dome, 4.1 cm right lower lobe mass and adjacent right lower lobe nodule CEA on 08/18/2014 of 93.7 ng/ml Right upper extremity DVT 04/17/2014 paired brachial veins, axillary vein, and peripheral aspect of the right subclavian vein CT chest 07/06/2014 with bilateral pulmonary nodules and masses, RUL, lateral RUL, posterior LUL, lobulated nodule in the subpleural RLL,     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 ([place])   02/20/2014 Imaging    CT of abdomen and pelvis on 02/20/2014 at [facility] showing 3.6 cm apple core lesion in the proximal sigmoid colon 11 mm hypoenhancing lesion in the medial left hepatic dome, 4.1 cm right lower lobe mass and adjacent right lower lobe nodule      02/23/2014 Pathology Results    KRAS MUTATED      04/17/2014 Imaging    Right upper extremity DVT 04/17/2014 paired brachial veins, axillary vein, and peripheral aspect of the right subclavian vein      07/06/2014 Imaging    CT chest 07/06/2014 with bilateral pulmonary nodules and masses, RUL, lateral RUL, posterior LUL, lobulated nodule in the subpleural RLL      08/18/2014 Tumor Marker    CEA 93.7 ng/ml      10/09/2014 Initial Diagnosis    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      02/19/2015 Imaging    Progressive pulmonary metastatic disease as detailed above. Stable mediastinal lymphadenopathy. No abdominal/pelvic metastatic  disease is identified      04/24/2015 - 08/07/2015 Chemotherapy    XELIRI started on 04/24/2015. Patient did not want to wear 5-FU pump.  On 2000 mg BID 14 days on and 7 days off.      04/28/2015 Adverse Reaction    Mild stomatitis.  Xeloda held.  Treated with Magic Mouthwash.  Resolved by 05/02/2015 at office visit.      05/15/2015 Treatment Plan Change    Will restart Xeloda at 1500 mg BID 14 days on and 7 days off (11/1)      07/17/2015 Adverse Reaction    Mouth sores      07/17/2015 Treatment Plan Change    Change in Xeloda frequency: 1500 mg BID 7 days on and 7 days off.      07/24/2015 Adverse Reaction    Increased nausea/vomiting      08/07/2015 Treatment Plan Change    Irinotecan dose reduced by 10%      08/10/2015 Imaging    MRI brain- Scattered punctate subcortical T2 hyperintensities bilaterally are slightly greater than expected for age. The finding is nonspecific but can be seen in the setting of chronic microvascular ischemia, a demyelinating process such as multiple...      09/03/2015 Progression    CT CAP- demonstrates new hepatic lesions      09/03/2015 Imaging    CT CAP- Multiple new hepatic metastasis in the R hepatic lobe corresponds to subtle hypodensities identified on comparison exam.  Stable  metastatic bilateral pulmonary nodules and masses. Small mesenteric lymph nodes along the sigmoid colon are unchanged      09/24/2015 -  Chemotherapy    Stivarga 160 mg daily 21/28 days      12/23/2015 - 12/25/2015 Hospital Admission    Rectal Bleeding, RUQ pain      12/24/2015 Imaging    CT chest- Significant progression of multiple B/L pulmonary metastases. Mild progression of mediastinal lymphadenopathy. Significant progression of multiple hepatic metastases. Mass in the sigmoid colon, increasing in prominence, concerning for progressio      12/24/2015 Imaging    CT head- No acute intracranial abnormalities. No significant mass effect or focal lesion  identified.      12/24/2015 Progression    CT scans demonstrate progression of disease.      12/31/2015 Miscellaneous    Consult at [facility] for consideration of clinical trial.  Seen by Dr. Teryl Lucy.      02/04/2016 Procedure    US guided core biopsy of right hepatic lobe lesion by Dr. Anselm Pancoast.  4 cores obtained.      02/05/2016 Pathology Results    Liver, needle/core biopsy, Right Hepatic Lobe - METASTATIC ADENOCARCINOMA, CONSISTENT WITH COLONIC PRIMARY.  Sent for [facility] testing.      03/30/2016 Miscellaneous    [facility] consultation       04/27/2016 -  Chemotherapy    Lynparza       05/09/2016 Imaging    MRI brain- Negative for metastatic disease. No findings to explain dizziness.       HISTORY OF PRESENTING ILLNESS:   Erica Barker 55 y.o. female is here because of stage IV colon cancer. Unfortunately is kras mutated and MSI stable. She was seen in consultation at [facility] for clinical trial was a candidate but declined. She has been placed on parp inhibitor. Erica Barker is reluctant to accept Hospice intervention.  Patient is unaccompanied. She bought a new house and is staying with her daughter.   Erica Barker is having difficulty moving around at home. She says she feels trapped. She tries to do exercises at home by standing up with her walker.   Patient is experiencing a significant amount of rectal bleeding when she passes a bowel movement. She also experiences significant heart burn.  Erica Barker states her pain is not too bad. She is using pain medication as needed. Patient also states she is eating well. Weight however continues to decline. Speech is quiet and slowed.   She needs refill for Carafate and hydrocodone. She admits to needing assistance for all of her ADL's.   MEDICAL HISTORY:  Past Medical History:  Diagnosis Date  . Cancer ([place])    stage 4 colon with mets to liver and lung  . Depression   . Diabetes mellitus, type II ([place])   .  Headache(784.0)   . Hypertension   . Lymphadenopathy, mediastinal 01/2014   [place]  . Medically noncompliant 09/06/2015  . Morbid obesity ([place])   . Pulmonary nodules 01/30/14   CTA CHEST    SURGICAL HISTORY: Past Surgical History:  Procedure Laterality Date  . CHOLECYSTECTOMY    . TUBAL LIGATION    . VIDEO BRONCHOSCOPY WITH ENDOBRONCHIAL ULTRASOUND N/A 02/06/2014   Procedure: VIDEO BRONCHOSCOPY WITH ENDOBRONCHIAL ULTRASOUND,bronchial biopsies , node sampling;  Surgeon: Melrose Nakayama, MD;  Location: [place];  Service: Thoracic;  Laterality: N/A;    SOCIAL HISTORY: Social History   Social History  . Marital status: Widowed    Spouse  name: N/A  . Number of children: N/A  . Years of education: N/A   Occupational History  . Not on file.   Social History Main Topics  . Smoking status: Former Smoker    Packs/day: 0.50    Years: 7.00    Types: Cigarettes    Quit date: 02/03/1989  . Smokeless tobacco: Never Used  . Alcohol use Yes     Comment: occasional  . Drug use: No  . Sexual activity: Not on file   Other Topics Concern  . Not on file   Social History Narrative  . No narrative on file  She is in a relationship. 3 children. Aged 32,35,20.  She has no grandchildren. She smoked in high school but quit after graduation. No ETOH. 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  She is originally from [place].  FAMILY HISTORY: Family History  Problem Relation Age of Onset  . Cancer Mother     UTERINE  . Cancer Father     LEUKEMIA  . Crohn's disease Son   . Colon cancer Neg Hx    indicated that her mother is alive. She indicated that her father is deceased. She indicated that her son is alive. She indicated that the status of her neg hx is unknown.    Father is deceased at age 53 from leukemia, mother is alive at 22 with a history of uterine cancer.  She has one sister how is healthy and one brother who is  healthy. He works as a Engineer, structural.   ALLERGIES:  is allergic to asa [aspirin].  MEDICATIONS:  Current Outpatient Prescriptions  Medication Sig Dispense Refill  . dexamethasone (DECADRON) 4 MG tablet Take 1 tablet (4 mg total) by mouth every 12 (twelve) hours. 60 tablet 0  . dexlansoprazole (DEXILANT) 60 MG capsule Take 1 capsule (60 mg total) by mouth daily. 30 capsule 5  . lidocaine-prilocaine (EMLA) cream Apply 1 application topically as needed.    Marland Kitchen lisinopril (PRINIVIL,ZESTRIL) 10 MG tablet TAKE ONE TABLET BY MOUTH  DAILY 30 tablet 6  . LORazepam (ATIVAN) 0.5 MG tablet Take 1 tablet (0.5 mg total) by mouth every 6 (six) hours as needed (nasuea/vomiting). 60 tablet 1  . Multiple Vitamin (MULTIVITAMIN) capsule Take 1 capsule by mouth daily.    . Olaparib 100 MG TABS Take 300 mg by mouth 2 (two) times daily. 180 tablet 3  . oxyCODONE (OXY IR/ROXICODONE) 5 MG immediate release tablet Take 1-2 tablets (5-10 mg total) by mouth every 4 (four) hours as needed for severe pain. 30 tablet 0  . oxyCODONE (OXYCONTIN) 15 mg 12 hr tablet Take 1 tablet (15 mg total) by mouth every 12 (twelve) hours. 60 tablet 0  . polyethylene glycol (MIRALAX / GLYCOLAX) packet Take 17 g by mouth daily. 14 each 0  . prochlorperazine (COMPAZINE) 10 MG tablet Take 1 tablet (10 mg total) by mouth 2 (two) times daily as needed for nausea or vomiting. 10 tablet 0  . promethazine (PHENERGAN) 25 MG suppository Place 1 suppository (25 mg total) rectally every 6 (six) hours as needed. (Patient taking differently: Place 25 mg rectally every 6 (six) hours as needed for nausea or vomiting. ) 12 each 1  . senna (SENOKOT) 8.6 MG TABS tablet Take 2 tablets (17.2 mg total) by mouth daily. 120 each 0  . sucralfate (CARAFATE) 1 GM/10ML suspension Take 10 mLs (1 g total) by mouth 4 (four) times daily -  with meals and at bedtime. 420 mL 1   Current Facility-Administered Medications  Medication Dose Route Frequency Provider Last Rate  Last Dose  . heparin lock flush 100 unit/mL  500 Units Intravenous Once Patrici Ranks, MD      . sodium chloride flush (NS) 0.9 % injection 10 mL  10 mL Intravenous PRN Patrici Ranks, MD        Review of Systems  Constitutional: Positive for fatigue. Negative for fever, chills, and weight loss.  HENT:  Negative for congestion, hearing loss, nosebleeds, sore throat and tinnitus.   Eyes: Negative for blurred vision, double vision, pain and discharge.  Respiratory: Negative for cough, hemoptysis, sputum production, shortness of breath and wheezing.   Cardiovascular: Negative for chest pain, palpitations, claudication, leg swelling and PND.  Gastrointestinal:  Positive for heartburn and rectal bleeding, Negative for nausea, vomiting, abdominal pain, diarrhea, constipation, blood in stool and melena.  Genitourinary: Negative for dysuria, urgency, frequency and hematuria.  Musculoskeletal: Negative for myalgias, joint pain and falls.  Skin:  Negative for rash Neurological: Negative for dizziness, tingling, tremors, sensory change, speech change, focal weakness, seizures, loss of consciousness, weakness. Endo/Heme/Allergies: Does not bruise/bleed easily.  Psychiatric/Behavioral: Negative for depression, suicidal ideas, memory loss and substance abuse. The patient is not nervous/anxious and does not have insomnia.   14 point review of systems was performed and is negative except as detailed under history of present illness and above  PHYSICAL EXAMINATION: ECOG PERFORMANCE STATUS: 3 - Symptomatic, >50% confined to bed  Vitals:   05/22/16 1029  BP: 121/79  Pulse: (!) 103  Resp: 20  Temp: 98.7 F (37.1 C)   Filed Weights    Physical Exam  Constitutional: She is oriented to person, place, and time, uncomfortable, fatigued Physical exam conducted in wheelchair. Head: Normocephalic and atraumatic.  Nose: Nose normal.  Mouth/Throat: Oropharynx is clear and moist. No oropharyngeal  exudate.  Eyes: Conjunctivae and EOM are normal. Pupils are equal, round, and reactive to light. Scleral icterus Neck: Normal range of motion. Neck supple. No tracheal deviation present. No thyromegaly present.  Cardiovascular: Normal rate, regular rhythm and normal heart sounds.  Exam reveals no gallop and no friction rub.  No murmur heard. Pulmonary/Chest: Effort normal and breath sounds normal. She has no wheezes. She has no rales.  Abdominal: Enlarged liver. Soft. Bowel sounds are normal.  There is no rebound and no guarding.  Musculoskeletal: Normal range of motion. She exhibits edema.  Lymphadenopathy:  She has no cervical adenopathy.  Neurological: She is alert and oriented to person, place, and time. No cranial nerve deficit.  Skin:Skin is warm and dry. No rash noted.  Psychiatric: tearful, denial Nursing note and vitals reviewed.   LABORATORY DATA:  I have reviewed the data as listed. Results for Erica, Barker (MRN [id number]) as of 05/22/2016 11:22  Results for Erica Barker, Erica Barker (MRN [id number]) as of 07/03/2016 13:48  Ref. Range 05/22/2016 11:05  Sodium Latest Ref Range: 135 - 145 mmol/L 127 (L)  Potassium Latest Ref Range: 3.5 - 5.1 mmol/L 4.6  Chloride Latest Ref Range: 101 - 111 mmol/L 93 (L)  CO2 Latest Ref Range: 22 - 32 mmol/L 23  BUN Latest Ref Range: 6 - 20 mg/dL 10  Creatinine Latest Ref Range: 0.44 - 1.00 mg/dL 0.34 (L)  Calcium Latest Ref Range: 8.9 - 10.3 mg/dL 9.3  EGFR (Non-African Amer.) Latest Ref Range: >60 mL/min >60  EGFR (African American) Latest Ref Range: >60 mL/min >60  Glucose Latest Ref Range: 65 - 99 mg/dL 124 (H)  Anion gap Latest Ref Range: 5 - 15  11  Alkaline Phosphatase Latest Ref Range: 38 - 126 U/L 1,450 (H)  Albumin Latest Ref Range: 3.5 - 5.0 g/dL 2.5 (L)  AST Latest Ref Range: 15 - 41 U/L 158 (H)  ALT Latest Ref Range: 14 - 54 U/L 160 (H)  Total Protein Latest Ref Range: 6.5 - 8.1 g/dL 6.2 (L)  Total Bilirubin Latest Ref Range:  0.3 - 1.2 mg/dL 13.2 (H)  WBC Latest Ref Range: 4.0 - 10.5 K/uL 14.7 (H)  RBC Latest Ref Range: 3.87 - 5.11 MIL/uL 3.55 (L)  Hemoglobin Latest Ref Range: 12.0 - 15.0 g/dL 10.5 (L)  HCT Latest Ref Range: 36.0 - 46.0 % 31.7 (L)  MCV Latest Ref Range: 78.0 - 100.0 fL 89.3  MCH Latest Ref Range: 26.0 - 34.0 pg 29.6  MCHC Latest Ref Range: 30.0 - 36.0 g/dL 33.1  RDW Latest Ref Range: 11.5 - 15.5 % 26.8 (H)  Platelets Latest Ref Range: 150 - 400 K/uL 338  Neutrophils Latest Units: % 78  Lymphocytes Latest Units: % 16  Monocytes Relative Latest Units: % 6  Eosinophil Latest Units: % 0  Basophil Latest Units: % 0  NEUT# Latest Ref Range: 1.7 - 7.7 K/uL 11.4 (H)  Lymphocyte # Latest Ref Range: 0.7 - 4.0 K/uL 2.4  Monocyte # Latest Ref Range: 0.1 - 1.0 K/uL 0.9  Eosinophils Absolute Latest Ref Range: 0.0 - 0.7 K/uL 0.0  Basophils Absolute Latest Ref Range: 0.0 - 0.1 K/uL 0.0  RBC Morphology Unknown POLYCHROMASIA PRE...  WBC Morphology Unknown ATYPICAL LYMPHOCYTES  CEA Latest Ref Range: 0.0 - 4.7 ng/mL 9,471.0 (H)    RADIOGRAPHIC STUDIES I have personally reviewed the radiological images as listed and agreed with the findings in the report. Study Result   CLINICAL DATA:  Evaluate for metastatic disease. Metastatic colon cancer. Difficulty with balance.  EXAM: MRI HEAD WITHOUT AND WITH CONTRAST  TECHNIQUE: Multiplanar, multiecho pulse sequences of the brain and surrounding structures were obtained without and with intravenous contrast.  CONTRAST:  32m MULTIHANCE GADOBENATE DIMEGLUMINE 529 MG/ML IV SOLN  COMPARISON:  08/10/2015  FINDINGS: Brain: No abnormal enhancement or swelling to suggest metastatic disease. No asymmetric signal or enhancement in the labyrinthine structures. No significant white matter disease, atrophy, infarct, hemorrhage, or hydrocephalus.  Vascular: Normal flow voids.  Skull and upper cervical spine: No focal marrow lesion. Hypo intense marrow in  the cervical spine which is likely from colon cancer treatment  Sinuses/Orbits: Negative  IMPRESSION: Negative for metastatic disease.  No findings to explain dizziness.   Electronically Signed   By: JMonte FantasiaM.D.   On: 05/09/2016 14:32     Study Result   INDICATION: 55year old with metastatic colon cancer. Patient needs additional tissue for molecular testing. EXAM: ULTRASOUND-GUIDED LIVER LESION BIOPSY MEDICATIONS: None. ANESTHESIA/SEDATION: Moderate (conscious) sedation was employed during this procedure. A total of Versed 2.0 mg and Fentanyl 100 mcg was administered intravenously. Moderate Sedation Time: 16 minutes. The patient's level of consciousness and vital signs were monitored continuously by radiology nursing throughout the procedure under my direct supervision. FLUOROSCOPY TIME:  None COMPLICATIONS: None immediate. PROCEDURE: Informed written consent was obtained from the patient after a thorough discussion of the procedural risks, benefits and alternatives. All questions were addressed. A timeout was performed prior to the initiation of the procedure. Liver was evaluated with ultrasound. The right hepatic lobe was selected for biopsy. The  right side of the abdomen was prepped with chlorhexidine and sterile field was created. The skin and soft tissues were anesthetized with 1% lidocaine. Using ultrasound guidance, 17 gauge coaxial needle was directed into the right hepatic lesion. Four core biopsies obtained with an 18 gauge core device. 17 gauge needle was removed without complication. Bandage placed over the puncture site. Ultrasound images were taken and saved for this procedure. FINDINGS: Large irregular hypoechoic lesions scattered throughout the liver. Large portion of the inferior right hepatic lobe appears to be involved with tumor. This area was sampled. IMPRESSION: Ultrasound-guided core biopsies of a right hepatic  lesion. Electronically Signed   By: Markus Daft M.D.   On: 02/04/2016 17:45   PATHOLOGY:    ASSESSMENT & PLAN:  Stage IV colorectal cancer with pulmonary and liver metastases Severe nausea and vomiting after FOLFOX therapy Right upper extremity DVT  Headaches Progression on FOLFOX therapy XELIRI/AVASTIN Kras mutation, Nras WT MSI Stable Hypokalemia Rectal Bleed Patient declined clinical trials Candida Depression, situational Diverticular abscess Hyperbilirubinemia  Transaminitis Jaundice Goals of Care Discussion  Patient is jaundiced and does not appear well. She needs assistance with all ADL's.  After speaking to [facility] and myself, Erica Barker has agreed to Hospice intervention and has signed a DNR. CEA on 10/23 was 6,5410. Lab work today was reviewed with the patient and is noted above.   I have refilled Carafate. I have also refilled oxycodone.  Hospice referral is placed.  Orders Placed This Encounter  Procedures  . CBC with Differential    Standing Status:   Future    Number of Occurrences:   1    Standing Expiration Date:   05/22/2017  . Comprehensive metabolic panel    Standing Status:   Future    Number of Occurrences:   1    Standing Expiration Date:   05/22/2017  . CEA    Standing Status:   Future    Number of Occurrences:   1    Standing Expiration Date:   05/22/2017   All questions were answered. The patient knows to call the clinic with any problems, questions or concerns.    This document serves as a record of services personally performed by Ancil Linsey, MD. It was created on her behalf by [name], a trained medical scribe. The creation of this record is based on the scribe's personal observations and the provider's statements to them. This document has been checked and approved by the attending provider.  I have reviewed the above documentation for accuracy and completeness, and I agree with the above.  Kelby Fam. Whitney Muse, MD

## 2016-05-22 NOTE — Progress Notes (Unsigned)
Records faxed to [facility] on 11/9. Talked to [facility] and will call patient.

## 2016-05-22 NOTE — Patient Instructions (Addendum)
[place] at [facility] Discharge Instructions  RECOMMENDATIONS MADE BY THE CONSULTANT AND ANY TEST RESULTS WILL BE SENT TO YOUR REFERRING PHYSICIAN.  You saw Dr.Penland today. Follow up in 2 weeks with Tom Do not leave until Dr. Whitney Muse talks to you after labs. Prescription refills See Amy at checkout for appointments.  Thank you for choosing [place] at [facility] to provide your oncology and hematology care.  To afford each patient quality time with our provider, please arrive at least 15 minutes before your scheduled appointment time.   Beginning January 23rd 2017 lab work for the [name] will be done in the  Main lab at [facility] on 1st floor. If you have a lab appointment with the [facility] please come in thru the  Main Entrance and check in at the main information desk  You need to re-schedule your appointment should you arrive 10 or more minutes late.  We strive to give you quality time with our providers, and arriving late affects you and other patients whose appointments are after yours.  Also, if you no show three or more times for appointments you may be dismissed from the clinic at the providers discretion.     Again, thank you for choosing [facility].  Our hope is that these requests will decrease the amount of time that you wait before being seen by our physicians.       _____________________________________________________________  Should you have questions after your visit to [facility], please contact our office at [phone] between the hours of 8:30 a.m. and 4:30 p.m.  Voicemails left after 4:30 p.m. will not be returned until the following business day.  For prescription refill requests, have your pharmacy contact our office.         Resources For Cancer Patients and their Caregivers ? American Cancer Society: Can assist with transportation, wigs, general needs, runs Look  Good Feel Better.        2724529963 ? Cancer Care: Provides financial assistance, online support groups, medication/co-pay assistance.  1-800-813-HOPE 831-657-2945) ? [place] Assists [place] cancer patients and their families through emotional , educational and financial support.  785-808-3849 ? [place] DSS Where to apply for food stamps, Medicaid and utility assistance. (928) 168-6715 ? [facility]: Transportation to medical appointments. (940)440-4908 ? Social Security Administration: May apply for disability if have a Stage IV cancer. (321) 069-1948 (726)685-5534 ? [employer], Disability and Transit Services: Assists with nutrition, care and transit needs. [place] Support Programs: '@10RELATIVEDAYS'$ @ > Cancer Support Group  2nd Tuesday of the month 1pm-2pm, Journey Room  > Creative Journey  3rd Tuesday of the month 1130am-1pm, Journey Room  > Look Good Feel Better  1st Wednesday of the month 10am-12 noon, Journey Room (Call [place] to register [phone])

## 2016-05-22 NOTE — Progress Notes (Signed)
Went to see how pt was doing during her office visit with Dr Whitney Muse.  She stated that she had been taking her lynparza as prescribed twice a day.  She said she didn't know if it was helping much.  She was excited that she was moving out of her apartment today and moving in with her daughter in a house in [place].  We are referring her to Hospice today.  Called [name] and told her about the situation.  She was out of the office so she is going to call [name] and speak with her.  Erica Barker knows Erica Barker and looks forward to speaking with her.

## 2016-05-23 LAB — CEA: CEA: 9471 ng/mL — AB (ref 0.0–4.7)

## 2016-06-10 ENCOUNTER — Ambulatory Visit (HOSPITAL_COMMUNITY): Admitting: Oncology

## 2016-06-13 DEATH — deceased

## 2016-06-16 ENCOUNTER — Other Ambulatory Visit (HOSPITAL_COMMUNITY)

## 2016-07-03 ENCOUNTER — Encounter (HOSPITAL_COMMUNITY): Payer: Self-pay | Admitting: Hematology & Oncology

## 2018-04-19 IMAGING — CT CT ABD-PELV W/ CM
3 of 4 series · 11 of 46 positions shown, 16 images · IV contrast (iopamidol)
Comparison: 09/03/2015

CLINICAL DATA: Stage IV colon cancer metastatic to lungs and liver.
Right rib cage pain. Blood in stools.

EXAM:
CT CHEST, ABDOMEN, AND PELVIS WITH CONTRAST
TECHNIQUE: Multidetector CT imaging of the chest, abdomen and pelvis was
performed following the standard protocol during bolus
administration of intravenous contrast.
CONTRAST:  100mL IUUGCD-RSS IOPAMIDOL (IUUGCD-RSS) INJECTION 61%

[Series 3: coronal · coronal · 0.79mm/px · 3 of 77 slices shown]
[im 26/77  soft-tissue]
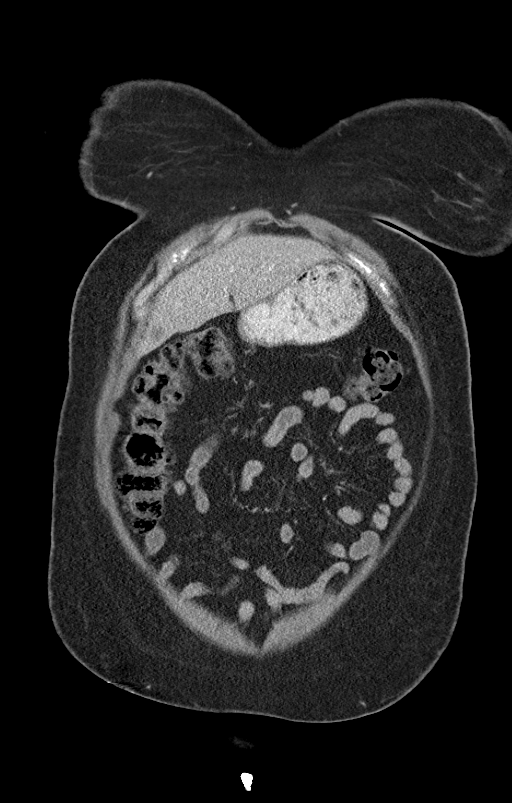
[im 34/77  soft-tissue]
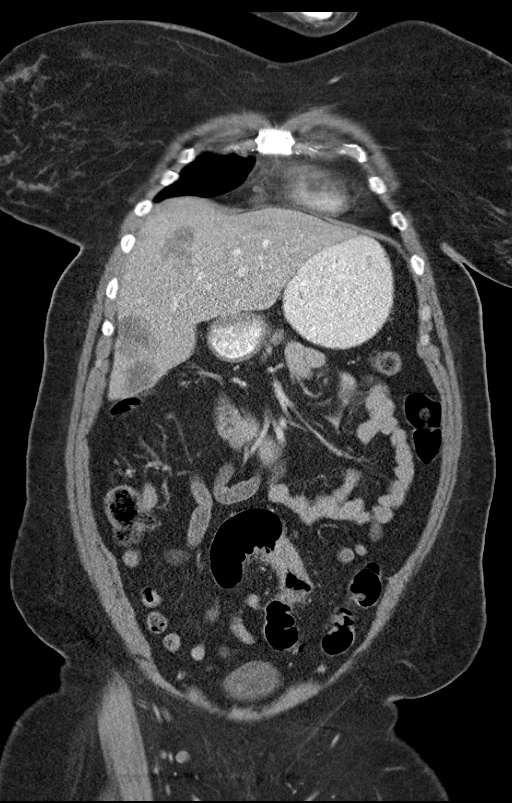
[im 43/77  soft-tissue]
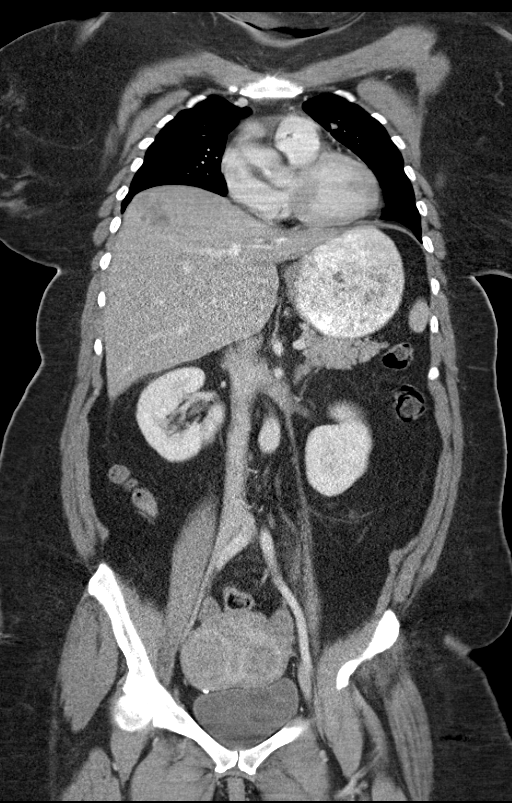

[Series 4: sagittal · sagittal · 0.72mm/px · 1 of 83 slices shown]
[im 28/83  soft-tissue]
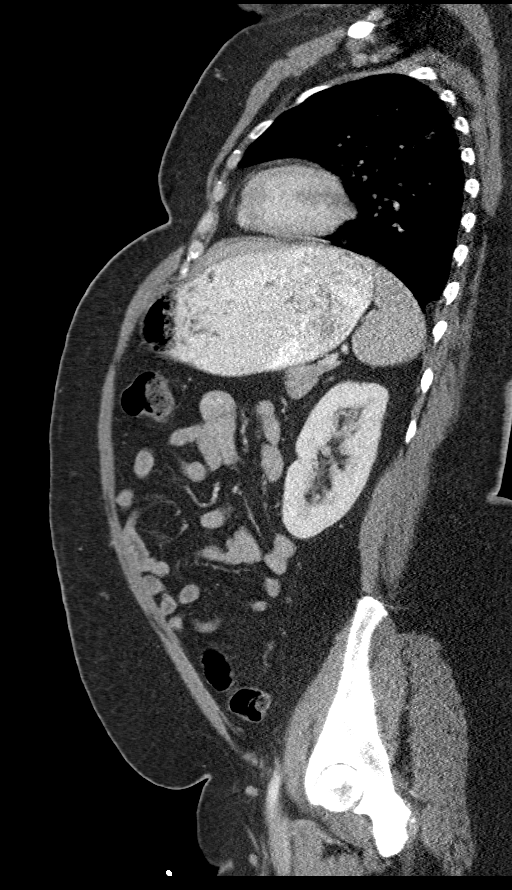

[Series 8: kidney delays · axial · 0.54mm/px · z∈[+686,+882]mm · 7 of 53 slices shown, 12 images]
[im 7/53  soft-tissue]
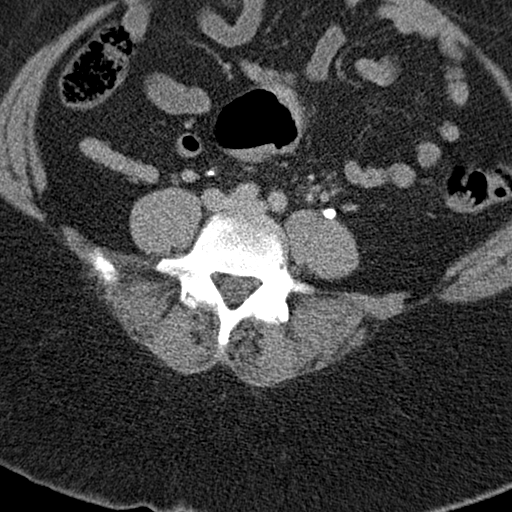
[im 7/53  bone]
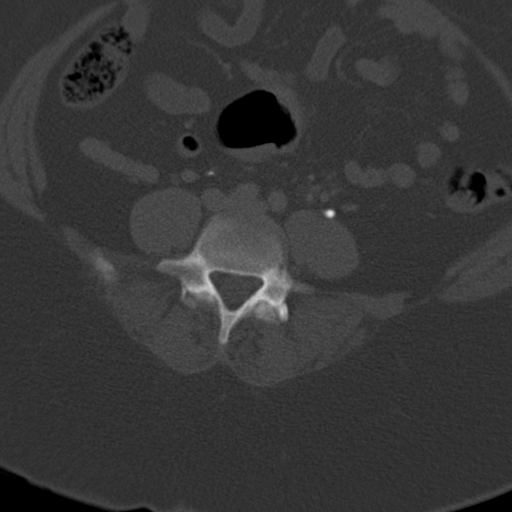
[im 14/53  soft-tissue]
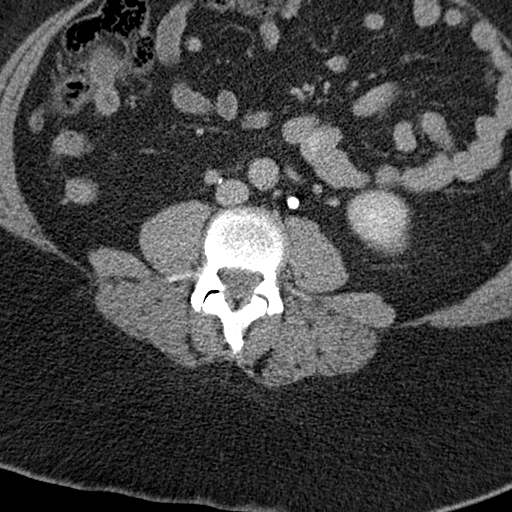
[im 20/53  soft-tissue]
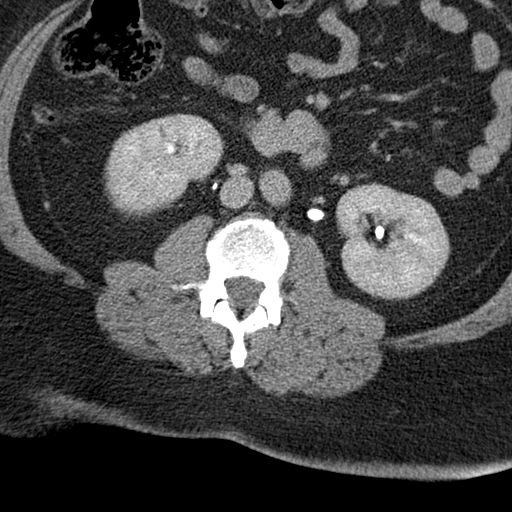
[im 27/53  soft-tissue]
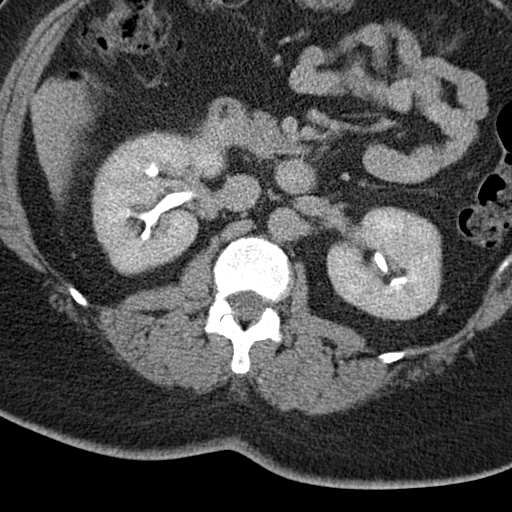
[im 27/53  lung]
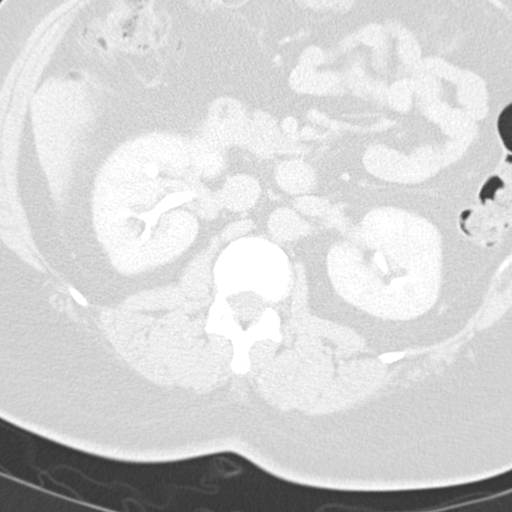
[im 33/53  soft-tissue]
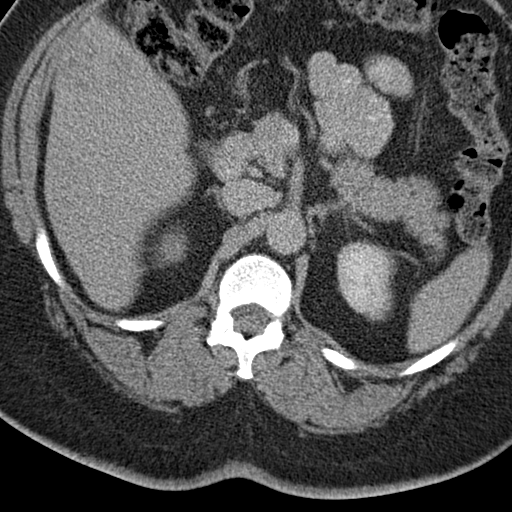
[im 33/53  lung]
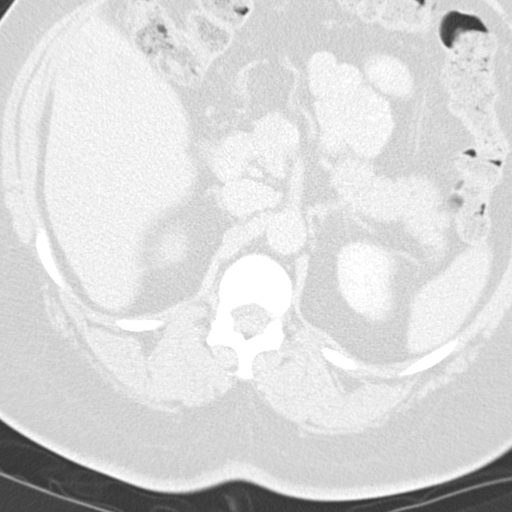
[im 40/53  soft-tissue]
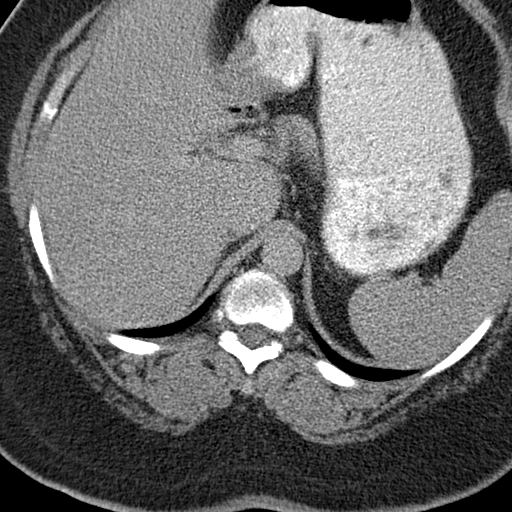
[im 40/53  lung]
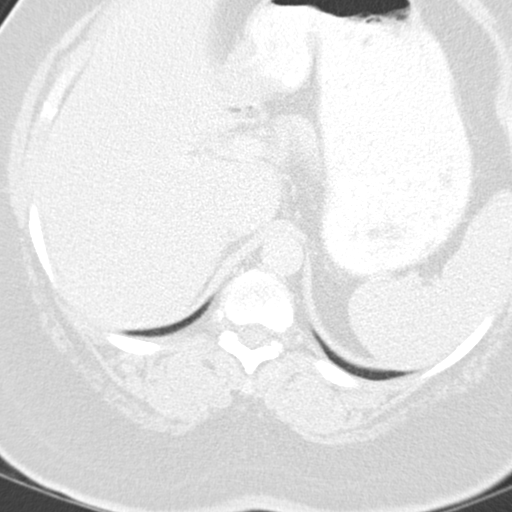
[im 46/53  soft-tissue]
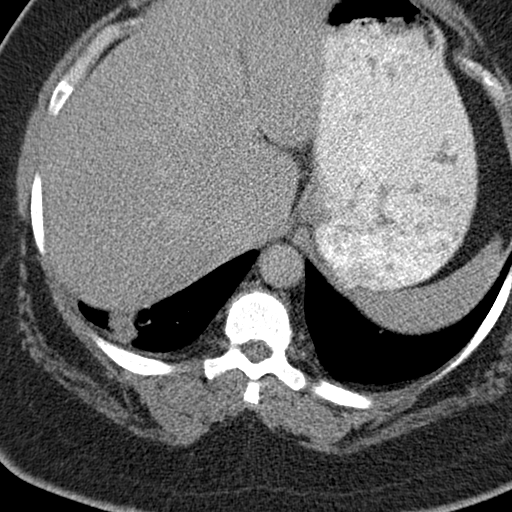
[im 46/53  lung]
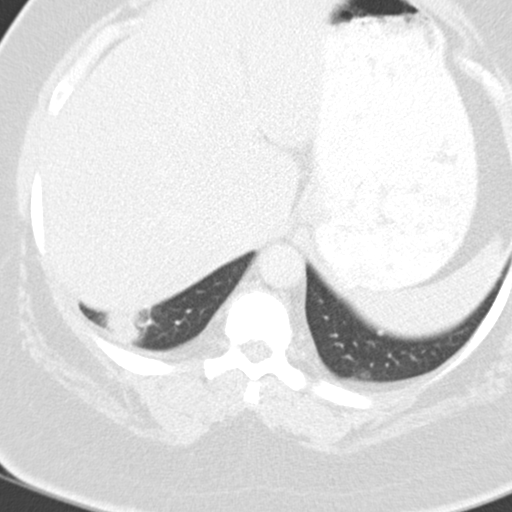

[11 of 46 positions shown; findings below may reference images not displayed]

FINDINGS: CT CHEST FINDINGS

Mediastinum/Lymph Nodes: There is right pretracheal lymphadenopathy
measuring 1.5 cm diameter, increasing since prior study. Subcarinal
lymphadenopathy measures 1.4 cm, increasing since prior study.
Normal heart size. Normal caliber thoracic aorta. Esophagus is
decompressed.

Lungs/Pleura: Multiple pulmonary metastases diffusely throughout
both lungs. Largest lesion is in the right lung base posteriorly and
measures 5.9 by 4.9 cm, increasing since previous study. No pleural
effusions. No pneumothorax.

Musculoskeletal: No chest wall mass or suspicious bone lesions
identified.

CT ABDOMEN PELVIS FINDINGS

Hepatobiliary: Multiple low-attenuation metastases throughout the
liver. The lesion previously measured in the dome of the liver today
measures 4.8 x 3.6 cm, representing significant increase in size.
Previously measured lesion in the inferior right lobe of the liver
today measures 3.8 by 2.7 cm, representing significant increase.
Previous lesion at the junction of segments 4 and 8 today measures
3.3 x 3.4 cm, representing significant increase in size.

The gallbladder is either contracted or surgically absent. No bile
duct dilatation.

Pancreas: No mass, inflammatory changes, or other significant
abnormality.

Spleen: Within normal limits in size and appearance.

Adrenals/Urinary Tract: No masses identified. No evidence of
hydronephrosis.

Stomach/Bowel: No evidence of obstruction, inflammatory process, or
abnormal fluid collections.There is a circumferential mass in the
mid sigmoid colon, more prominent than on prior study.

There is regional lymphadenopathy adjacent to the sigmoid colon
measuring up to 2.1 cm in diameter, increasing since previous study.

Vascular/Lymphatic: No pathologically enlarged lymph nodes. No
evidence of abdominal aortic aneurysm.

Reproductive: Multiple uterine masses are likely to represent
fibroids. No abnormal adnexal masses.

Other: None.

Musculoskeletal:  No suspicious bone lesions identified.
IMPRESSION: Chest: Significant progression of multiple bilateral pulmonary
metastases. Mild progression of mediastinal lymphadenopathy.

Abdomen:  Significant progression of multiple hepatic metastases.

Pelvis: Mass in the sigmoid colon, increasing in prominence since
previous study. Increasing regional lymphadenopathy around the
sigmoid colon.
